# Patient Record
Sex: Male | Born: 1945 | Race: White | Hispanic: No | Marital: Married | Smoking: Never smoker
Health system: Southern US, Community
[De-identification: ages and names within clinical notes are randomized; demographics above are authoritative.]

## PROBLEM LIST (undated history)

## (undated) DIAGNOSIS — M1990 Unspecified osteoarthritis, unspecified site: Secondary | ICD-10-CM

## (undated) DIAGNOSIS — N281 Cyst of kidney, acquired: Secondary | ICD-10-CM

## (undated) DIAGNOSIS — H269 Unspecified cataract: Secondary | ICD-10-CM

## (undated) DIAGNOSIS — R12 Heartburn: Secondary | ICD-10-CM

## (undated) DIAGNOSIS — S42309A Unspecified fracture of shaft of humerus, unspecified arm, initial encounter for closed fracture: Secondary | ICD-10-CM

## (undated) DIAGNOSIS — G4730 Sleep apnea, unspecified: Secondary | ICD-10-CM

## (undated) DIAGNOSIS — I2510 Atherosclerotic heart disease of native coronary artery without angina pectoris: Secondary | ICD-10-CM

## (undated) DIAGNOSIS — Z9861 Coronary angioplasty status: Secondary | ICD-10-CM

## (undated) DIAGNOSIS — I517 Cardiomegaly: Secondary | ICD-10-CM

## (undated) DIAGNOSIS — I5189 Other ill-defined heart diseases: Secondary | ICD-10-CM

## (undated) DIAGNOSIS — E785 Hyperlipidemia, unspecified: Secondary | ICD-10-CM

## (undated) DIAGNOSIS — K645 Perianal venous thrombosis: Secondary | ICD-10-CM

## (undated) DIAGNOSIS — I519 Heart disease, unspecified: Secondary | ICD-10-CM

## (undated) DIAGNOSIS — I350 Nonrheumatic aortic (valve) stenosis: Secondary | ICD-10-CM

## (undated) DIAGNOSIS — M419 Scoliosis, unspecified: Secondary | ICD-10-CM

## (undated) DIAGNOSIS — L409 Psoriasis, unspecified: Secondary | ICD-10-CM

## (undated) DIAGNOSIS — I340 Nonrheumatic mitral (valve) insufficiency: Secondary | ICD-10-CM

## (undated) DIAGNOSIS — G8929 Other chronic pain: Secondary | ICD-10-CM

## (undated) DIAGNOSIS — M545 Low back pain, unspecified: Secondary | ICD-10-CM

## (undated) DIAGNOSIS — R569 Unspecified convulsions: Secondary | ICD-10-CM

## (undated) DIAGNOSIS — H9190 Unspecified hearing loss, unspecified ear: Secondary | ICD-10-CM

## (undated) DIAGNOSIS — I5032 Chronic diastolic (congestive) heart failure: Secondary | ICD-10-CM

## (undated) DIAGNOSIS — R011 Cardiac murmur, unspecified: Secondary | ICD-10-CM

## (undated) DIAGNOSIS — I700 Atherosclerosis of aorta: Secondary | ICD-10-CM

## (undated) DIAGNOSIS — I00 Rheumatic fever without heart involvement: Secondary | ICD-10-CM

## (undated) DIAGNOSIS — M8580 Other specified disorders of bone density and structure, unspecified site: Secondary | ICD-10-CM

## (undated) DIAGNOSIS — M51369 Other intervertebral disc degeneration, lumbar region without mention of lumbar back pain or lower extremity pain: Secondary | ICD-10-CM

## (undated) DIAGNOSIS — R001 Bradycardia, unspecified: Secondary | ICD-10-CM

## (undated) DIAGNOSIS — C44329 Squamous cell carcinoma of skin of other parts of face: Secondary | ICD-10-CM

## (undated) DIAGNOSIS — M5136 Other intervertebral disc degeneration, lumbar region: Secondary | ICD-10-CM

## (undated) HISTORY — PX: BACK SURGERY: SHX140

## (undated) HISTORY — PX: ORIF SCAPULAR FRACTURE: SUR958

## (undated) HISTORY — DX: Nonrheumatic aortic (valve) stenosis: I35.0

## (undated) HISTORY — PX: FOREARM FRACTURE SURGERY: SHX649

## (undated) HISTORY — PX: CATARACT EXTRACTION, BILATERAL: SHX1313

## (undated) HISTORY — PX: CORONARY ANGIOPLASTY: SHX604

## (undated) HISTORY — PX: KNEE ARTHROSCOPY: SHX127

## (undated) HISTORY — PX: APPENDECTOMY: SHX54

## (undated) HISTORY — PX: COLONOSCOPY: SHX174

## (undated) HISTORY — DX: Rheumatic fever without heart involvement: I00

## (undated) HISTORY — PX: OTHER SURGICAL HISTORY: SHX169

## (undated) HISTORY — PX: TONSILLECTOMY: SUR1361

## (undated) HISTORY — DX: Perianal venous thrombosis: K64.5

## (undated) HISTORY — PX: MOHS SURGERY: SUR867

---

## 1999-06-03 ENCOUNTER — Emergency Department (HOSPITAL_COMMUNITY): Admission: EM | Admit: 1999-06-03 | Discharge: 1999-06-03 | Payer: Self-pay

## 1999-06-06 ENCOUNTER — Emergency Department (HOSPITAL_COMMUNITY): Admission: EM | Admit: 1999-06-06 | Discharge: 1999-06-06 | Payer: Self-pay | Admitting: Emergency Medicine

## 1999-06-07 ENCOUNTER — Emergency Department (HOSPITAL_COMMUNITY): Admission: EM | Admit: 1999-06-07 | Discharge: 1999-06-07 | Payer: Self-pay | Admitting: Emergency Medicine

## 1999-06-10 ENCOUNTER — Emergency Department (HOSPITAL_COMMUNITY): Admission: EM | Admit: 1999-06-10 | Discharge: 1999-06-10 | Payer: Self-pay | Admitting: Emergency Medicine

## 1999-06-17 ENCOUNTER — Emergency Department (HOSPITAL_COMMUNITY): Admission: EM | Admit: 1999-06-17 | Discharge: 1999-06-17 | Payer: Self-pay | Admitting: Internal Medicine

## 1999-06-24 ENCOUNTER — Emergency Department (HOSPITAL_COMMUNITY): Admission: EM | Admit: 1999-06-24 | Discharge: 1999-06-24 | Payer: Self-pay | Admitting: Emergency Medicine

## 2001-08-18 ENCOUNTER — Ambulatory Visit (HOSPITAL_COMMUNITY): Admission: RE | Admit: 2001-08-18 | Discharge: 2001-08-18 | Payer: Self-pay | Admitting: *Deleted

## 2001-08-18 ENCOUNTER — Encounter (INDEPENDENT_AMBULATORY_CARE_PROVIDER_SITE_OTHER): Payer: Self-pay | Admitting: Specialist

## 2002-08-16 HISTORY — PX: POSTERIOR LUMBAR FUSION: SHX6036

## 2003-07-08 ENCOUNTER — Inpatient Hospital Stay (HOSPITAL_COMMUNITY): Admission: RE | Admit: 2003-07-08 | Discharge: 2003-07-13 | Payer: Self-pay | Admitting: Neurosurgery

## 2003-08-07 ENCOUNTER — Encounter: Admission: RE | Admit: 2003-08-07 | Discharge: 2003-08-07 | Payer: Self-pay | Admitting: Neurosurgery

## 2003-10-09 ENCOUNTER — Encounter: Admission: RE | Admit: 2003-10-09 | Discharge: 2003-10-09 | Payer: Self-pay | Admitting: Neurosurgery

## 2004-07-21 ENCOUNTER — Encounter: Admission: RE | Admit: 2004-07-21 | Discharge: 2004-07-21 | Payer: Self-pay | Admitting: Neurosurgery

## 2004-07-27 ENCOUNTER — Encounter: Admission: RE | Admit: 2004-07-27 | Discharge: 2004-07-27 | Payer: Self-pay | Admitting: Neurosurgery

## 2006-05-26 ENCOUNTER — Ambulatory Visit (HOSPITAL_BASED_OUTPATIENT_CLINIC_OR_DEPARTMENT_OTHER): Admission: RE | Admit: 2006-05-26 | Discharge: 2006-05-26 | Payer: Self-pay | Admitting: Orthopedic Surgery

## 2006-08-04 ENCOUNTER — Ambulatory Visit (HOSPITAL_BASED_OUTPATIENT_CLINIC_OR_DEPARTMENT_OTHER): Admission: RE | Admit: 2006-08-04 | Discharge: 2006-08-04 | Payer: Self-pay | Admitting: Surgery

## 2007-08-17 HISTORY — PX: HERNIA REPAIR: SHX51

## 2009-03-13 ENCOUNTER — Encounter: Admission: RE | Admit: 2009-03-13 | Discharge: 2009-03-13 | Payer: Self-pay | Admitting: Neurosurgery

## 2009-03-13 DIAGNOSIS — M419 Scoliosis, unspecified: Secondary | ICD-10-CM

## 2009-03-13 DIAGNOSIS — M8580 Other specified disorders of bone density and structure, unspecified site: Secondary | ICD-10-CM

## 2009-03-13 HISTORY — DX: Scoliosis, unspecified: M41.9

## 2009-03-13 HISTORY — DX: Other specified disorders of bone density and structure, unspecified site: M85.80

## 2009-03-24 ENCOUNTER — Encounter: Admission: RE | Admit: 2009-03-24 | Discharge: 2009-03-24 | Payer: Self-pay | Admitting: Neurosurgery

## 2010-06-01 ENCOUNTER — Emergency Department (HOSPITAL_COMMUNITY)
Admission: EM | Admit: 2010-06-01 | Discharge: 2010-06-02 | Payer: Self-pay | Source: Home / Self Care | Admitting: Emergency Medicine

## 2010-08-22 ENCOUNTER — Encounter
Admission: RE | Admit: 2010-08-22 | Discharge: 2010-08-22 | Payer: Self-pay | Source: Home / Self Care | Attending: Family Medicine | Admitting: Family Medicine

## 2010-10-28 LAB — CBC
HCT: 45.8 % (ref 39.0–52.0)
Hemoglobin: 15.7 g/dL (ref 13.0–17.0)
MCH: 30.4 pg (ref 26.0–34.0)
MCHC: 34.3 g/dL (ref 30.0–36.0)
MCV: 88.6 fL (ref 78.0–100.0)
Platelets: 194 10*3/uL (ref 150–400)
RBC: 5.17 MIL/uL (ref 4.22–5.81)
RDW: 14.1 % (ref 11.5–15.5)
WBC: 5.1 10*3/uL (ref 4.0–10.5)

## 2010-10-28 LAB — BASIC METABOLIC PANEL
BUN: 13 mg/dL (ref 6–23)
CO2: 31 mEq/L (ref 19–32)
Calcium: 9.4 mg/dL (ref 8.4–10.5)
Chloride: 103 mEq/L (ref 96–112)
Creatinine, Ser: 0.97 mg/dL (ref 0.4–1.5)
GFR calc Af Amer: >60 mL/min (ref >60)
GFR calc non Af Amer: >60 mL/min (ref >60)
Glucose, Bld: 93 mg/dL (ref 70–99)
Potassium: 3.9 mEq/L (ref 3.5–5.1)
Sodium: 138 mEq/L (ref 135–145)

## 2010-10-28 LAB — URINALYSIS, ROUTINE W REFLEX MICROSCOPIC
Bilirubin Urine: NEGATIVE
Glucose, UA: NEGATIVE mg/dL
Hgb urine dipstick: NEGATIVE
Ketones, ur: NEGATIVE mg/dL
Nitrite: NEGATIVE
Protein, ur: NEGATIVE mg/dL
Specific Gravity, Urine: 1.013 (ref 1.005–1.030)
Urobilinogen, UA: 0.2 mg/dL (ref 0.0–1.0)
pH: 5.5 (ref 5.0–8.0)

## 2010-10-28 LAB — DIFFERENTIAL
Basophils Absolute: 0 10*3/uL (ref 0.0–0.1)
Basophils Relative: 0 % (ref 0–1)
Eosinophils Absolute: 0.1 10*3/uL (ref 0.0–0.7)
Eosinophils Relative: 3 % (ref 0–5)
Lymphocytes Relative: 20 % (ref 12–46)
Lymphs Abs: 1 10*3/uL (ref 0.7–4.0)
Monocytes Absolute: 0.5 10*3/uL (ref 0.1–1.0)
Monocytes Relative: 9 % (ref 3–12)
Neutro Abs: 3.5 10*3/uL (ref 1.7–7.7)
Neutrophils Relative %: 68 % (ref 43–77)

## 2011-01-01 NOTE — Op Note (Signed)
NAME:  Douglas Lane, Douglas Lane                          ACCOUNT NO.:  [id number]   MEDICAL RECORD NO.:  [id number]                   PATIENT TYPE:  INP   LOCATION:  3172                                 FACILITY:  [facility]   PHYSICIAN:  Kathaleen Maser. Lane, M.D.                 DATE OF BIRTH:  [birth year]   DATE OF PROCEDURE:  07/08/2003  DATE OF DISCHARGE:                                 OPERATIVE REPORT   PREOPERATIVE DIAGNOSIS:  1. 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  2. 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   POSTOPERATIVE DIAGNOSES:  1. 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  2. 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   PROCEDURE:  1. L3-4 and L4-5 decompressive lumbar laminectomies with foraminotomies.  2. L5-S1 Gill procedure with foraminotomies.  3. L3-4, L4-5 and L5-S1 posterior lumbar interbody fusion utilizing tangent     wedges and focal autografting.  4. L3 through S1 posterolateral fusion utilizing segmental pedicle screw     fixation and local autografting.   SURGEON:  Kathaleen Maser. Lane, M.D.   ASSISTANT:  Donalee Citrin, M.D.   ANESTHESIA:  General endotracheal anesthesia.   INDICATIONS FOR PROCEDURE:  The patient is a 65 year old male with a history  of progressive lumbar pain and right lower extremity radiculopathy failing  all conservative management.  Workup has demonstrated evidence of a very  significant degenerative scoliosis involving L3-4 and L4-5 with a coexistent  grade 1 L5-S1 lytic spondylolisthesis.  The patient has been counseled as to  his options.  He has decided to proceed with three level decompression and  fusion with instrumentation to hopefully improve his symptoms.   PROCEDURE:  The patient was brought to the operating room, placed on the  table in a supine position and after adequate oral anesthesia was achieved  the patient was placed prone onto a Wilson  frame, appropriately padded  progressively in the lumbar region.  He was then prepped and draped  sterilely.  A 10 blade was used to make a linear skin incision extending  from  L3 down to the sacrum.  This was carried down sharply in the midline.  A subperiosteal dissection was performed exposing the lamina of the facet  joints of L3, L4, L5 and S1 as well as the transverse processes of L3, 4 and  5 and the sacral ala bilaterally.  Deep self retaining retractors were  placed.  Intraoperative fluoroscopy was used and the levels were confirmed.  Complete laminectomies were then performed at L3 and L4 and an L5 Gill  procedure was performed.  Complete inferior facetectomies were performed at  L3 and L4.  Superior facetectomies were performed at L4, L5 and S1.  All  bone was cleaned to be used in later autografting.  The ligamentum flavum  was then elevated and resected in a  piecemeal fashion.  The underlying  thecal sac next to the L3, L4, L5 and S1 nerve roots were all widely  decompressed throughout their foramina.  The epidural venous plexus was  coagulated and cut. Starting first at L3-4, the thecal sac and nerve roots  were protected.  The disk space was then incised starting first on the right  side and aggressive diskectomy was performed.  The procedure was then  repeated on the contralateral side.  The disk space was then distracted up  to 10 mm.  With the distractor left in the patient's left side, the thecal  sac and nerve roots were protected on the right.  The disk space was then  reamed and then cut with a 10 mm chisel.  The soft tissues were removed from  the interspace.  A 10 x 26 mm tangent wedge was then impacted into space to  recess approximately 2 mm to the posterior corner margin.  The procedure was  then repeated on the contralateral side again.  Prior to instillation of the  second tangent wedge, morselized autograft was packed into the interspace.  The second wedge was  confirmed to be in good position by fluoroscopy.  Attention was then placed to L4-5 where the procedure was then repeated  again with bilateral placement of 10 x 26 mm tangent wedges and interbody  autografting performed.  The procedure was then repeated at L5-S1 this time  using 8 x 26 mm tangent wedges bilaterally and interbody autografting.  The  patient's reduction at L5-S1 was reduced completely.  The pedicles at L3,  L4, L5 and S1 were then identified using surface landmarks and  intraoperative fluoroscopy.  The superficial bone involving the pedicles was  removed using a high speed drill.  Each pedicle was then probed using a  pedicle awl.  Each pedicle awl track was then probed and found to be solid  in bone.  Each pedicle awl track was then trapped with a 5.2 mm screw tap.  Each screw tap hole was probed and found to be solid in bone. 6.75 x 45 mm  screws were placed bilaterally at L3; 6.75 x 40 mm screws were placed  bilaterally at L4; 5.75 x 35 mm screws were placed bilaterally at L5; and  6.75 x 35 mm screws were placed bilaterally at S1.  All screws were well  positioned and solidly within bone.  The transverse processes and sacral ala  were then decorticated using a high speed drill.  Morselized autograft was  packed posterolaterally.  A segment of titanium rod was then contoured and  placed over the screw heads of L3 through S1.  This was then attached to the  segmental instrumentation using locking caps.  The locking caps were then  engaged in a sequential fashion with the construct under compression. A  transverse connector was placed.  Final images showed good position of the  bone grafts and hardware and proper level of the normalized spine.  The  wound was then irrigated with antibiotic solution. It was then closed in  typical fashion. A medium Hemovac drain was left in the epidural space.  Steri-Strips and a sterile dressing were applied.  There were no intraoperative  complications.  The patient tolerated the procedure well and  he returns to the recovery room postoperative.  Douglas Lane, M.D.    HAP/MEDQ  D:  07/08/2003  T:  07/08/2003  Job:  [id number]

## 2011-01-01 NOTE — Op Note (Signed)
Douglas Lane, Douglas Lane                ACCOUNT NO.:  [id number]   MEDICAL RECORD NO.:  [id number]          PATIENT TYPE:  AMB   LOCATION:  DSC                          FACILITY:  [facility]   PHYSICIAN:  Currie Paris, M.D.DATE OF BIRTH:  1945/08/22   DATE OF PROCEDURE:  08/04/2006  DATE OF DISCHARGE:                               OPERATIVE REPORT   OFFICE MEDICAL RECORD NUMBER:  [id number].   PREOPERATIVE DIAGNOSIS:  Left inguinal hernia.   POSTOPERATIVE DIAGNOSIS:  Left inguinal hernia - direct.   OPERATION:  Repair of left inguinal hernia with mesh (plug and patch).   SURGEON:  Dr. Jamey Ripa.   ANESTHESIA:  General.   CLINICAL HISTORY:  Mr. Tollison is a 65 year old active gentleman who  wished to have his inguinal hernia repaired.   DESCRIPTION OF PROCEDURE:  The patient was seen in the holding area and  had no further questions.  We both confirmed that the left side was the  operative side, and I initialed the left inguinal area as the operative  site.   The patient was taken to the operating room, and after satisfactory  general (LMA) anesthesia was obtained, the groin area was clipped,  prepped and draped.  The time-out occurred.   To help with postoperative analgesia, I used a 0.25% plain Marcaine.  I  infiltrated along the incision line and then below the fascia at the  anterior superior iliac spine.  Incision was made and deepened to the  external oblique aponeurosis with bleeders coagulated or tied.  The  external oblique was opened in the line of its fibers into the  superficial ring.  The cord was dissected up off inguinal floor.  Additional local was infiltrated into these tissues to again help with  postoperative pain relief.   The cord was stripped off, and there was no indirect sac.  There is a  very large direct defect involving basically the entire inguinal floor.  I opened the residual thin layer of tissue to expose the defect better.  I put a large mesh  plug in and anchored it with numerous sutures of 2-0  Prolene starting at pubic tubercle and then superiorly, well under the  internal oblique transversalis fascia and then inferiorly along the  inguinal ligament.  This kept the hernia nicely reduced.  The onlay  patch was then sutured in with a running suture going medially and then  interrupteds laterally to tack into the internal oblique.  It was split  laterally to go around the cord and tacked together and lay nicely to  reconstruct the deep ring.   I irrigated, and everything appeared dry.  The incision was closed with  3-0 Vicryl on the external oblique, 3-0 Vicryl on Scarpa's, and 4-0  Monocryl subcuticular plus Dermabond.   The patient tolerated the procedure well, and there were no operative  complications.  All counts were correct.      Currie Paris, M.D.  Electronically Signed     CJS/MEDQ  D:  08/04/2006  T:  08/04/2006  Job:  [id number]   cc:   Molly Maduro  K. Abigail Miyamoto, M.D.

## 2011-01-01 NOTE — Op Note (Signed)
Douglas Lane, Douglas Lane                ACCOUNT NO.:  [id number]   MEDICAL RECORD NO.:  [id number]          PATIENT TYPE:  AMB   LOCATION:  DSC                          FACILITY:  [facility]   PHYSICIAN:  Deidre Ala, M.D.    DATE OF BIRTH:  1946/01/03   DATE OF PROCEDURE:  05/26/2006  DATE OF DISCHARGE:                                 OPERATIVE REPORT   PREOPERATIVE DIAGNOSIS:  Loss of reduction, distal shaft, radial fracture,  right forearm, closed.   POSTOPERATIVE DIAGNOSIS:  Loss of reduction, distal shaft, radial fracture,  right forearm, closed.   PROCEDURES:  1. Open reduction, internal fixation of right distal shaft radius      fracture, with Synthes DCP plate and screws.  2. Use of intraoperative fluoroscopy.   SURGEON:  1. Charlesetta Shanks, M.D.   ASSISTANT:  Clarene Reamer, P.A.-C.   ANESTHESIA:  General with LMA.   CULTURES:  None.   DRAINS:  None.   ESTIMATED BLOOD LOSS:  Minimal.   TOURNIQUET TIME:  52 minutes.   PATHOLOGIC FINDINGS AND HISTORY:  Douglas Lane is a 65 year old male, who got his  right arm caught under a railroad tie doing some yard work.  He went to the  emergency room in [place], where a minimally displaced distal radius shaft  fracture was made, and it was actually distal 1/3-middle 1/3 junction.  He  had a splint placed, and the patient was seen by [place] 05/09/06.  It was felt  that his fracture was stable at this time, minimally displaced. The splint  was somewhat loose.  On his next visit 05/16/06, no change had been noted in  his fracture on x-ray, so a new long-arm cast was placed with some  supination and molding.  The patient came back in for followup, the fracture  now being 82 days old with loss of reduction, and the cast was somewhat  loose.  He therefore was taken to the operating room today, where we took  down the early fracture callus and anatomically reduced with interdigitation  the basically 2-part fracture, achieving anatomic reduction,  placing a  slightly flexed plate at the unfilled central hole of the 7-hole plate,  leaving 6 cortices on either side with 6 screws, bicortical, 3 on either  side.  Anatomic reduction was obtained under C-arm fluoroscopy.   DESCRIPTION OF PROCEDURES:  With adequate anesthesia obtained using LMA  technique, 1 g Ancef given IV prophylaxis, the patient was placed in the  supine position.  The right upper extremity was prepped from the fingertips  to the elbow in the standard fashion.  After standard prepping and draping,  Esmarch exsanguination was used.  The tourniquet was let up to 250 mmHg.  I  then made a dorsal skin incision over the fracture, distal portion of  forearm.  The incision was deepened sharply with a knife and hemostasis  obtained using the Bovie electrocoagulator.  Dissection was carried down,  where a large vein was cut and tied off with 3-0 Vicryl doubly.  Further  dissection was carried down and the seam split between  the out-croppers of  the wrist and the extensor carpi radialis, exposing the fracture site.  The  fracture callus was removed.  Lahey clamps were then used to reduce the  fracture anatomically with the appropriate rotation and interdigitation of  the fracture fragment.  We then slid on a 7-hole plate, leaving the central  hole free, and used the dynamic compression mode of it with a slight flexion  of the plate to effect compression all the way across the fracture site.  We  then placed the first staple hole distally, and then proximally placed the  dynamic screw to compress the fracture.  Other screw holes were then filled  in and C-arm fluoroscopy confirmed positioning.  We then placed some local  bone graft that we had removed, taking down the current callus into the  fracture site.  Irrigation was carried out.  The tourniquet was let down.  Bleeding points were cauterized.  The wound was then closed in layers with 2-  0 and 3-0 Vicryl subcu and skin  staples.  A bulky sterile compressive  dressing was applied with a sugar-tong splint and hand dressing.  The  patient, then, having tolerated the procedure well, was awakened and taken  to the recovery room in satisfactory condition.  He will be discharged per  outpatient routine with forearm elevation with a hand dressing, Percocet for  pain, told to call the office for wound recheck on Monday.           ______________________________  V. Charlesetta Shanks, M.D.     VEP/MEDQ  D:  05/26/2006  T:  05/26/2006  Job:  [id number]   cc:   Chales Salmon. Abigail Miyamoto, M.D.

## 2011-01-01 NOTE — Discharge Summary (Signed)
NAME:  Douglas Lane, Douglas Lane                          ACCOUNT NO.:  [id number]   MEDICAL RECORD NO.:  [id number]                   PATIENT TYPE:  INP   LOCATION:  3014                                 FACILITY:  [facility]   PHYSICIAN:  Kathaleen Maser. Pool, M.D.                 DATE OF BIRTH:  [birth year]   DATE OF ADMISSION:  07/08/2003  DATE OF DISCHARGE:  07/13/2003                                 DISCHARGE SUMMARY   ATTENDING PHYSICIAN:  Julio Sicks, M.D.   SERVICE:  Neurosurgery   FINAL DIAGNOSIS:  L3-4, L4-5, and L5-S1 degenerative disk disease with  degenerative scoliosis and spondylolisthesis.   HISTORY OF PRESENT ILLNESS:  Mr. Glascoe is a 65 year old male with history  of progressive back and lower extremity pain consistent with lumbar  stenosis.  Workup has demonstrated evidence of severe degenerative scoliosis  with stenosis and an associated spondylolisthesis at L4-S1.  The patient  presents for three-level decompression and fusion with instrumentation.   HOSPITAL COURSE:  The patient was admitted and an uncomplicated L3-4, L4-5,  and L5-S1 decompression and posterior lumbar fusion with instrumentation was  performed.  Postoperatively the patient did well.  His back pain was  difficult to control initially but gradually improved.  He had no lower  extremity pain.  Motor and sensory examination were intact.  At the time of  discharge, the patient was ambulating without assistance.  He was tolerating  a regular diet.  His wound is healing well.   CONDITION AT DISCHARGE:  Improved   DISCHARGE DISPOSITION:  The patient will follow up in one week in my office.                                                [name], M.D.    HAP/MEDQ  D:  08/15/2003  T:  08/15/2003  Job:  [id number]

## 2011-01-27 ENCOUNTER — Emergency Department (HOSPITAL_COMMUNITY)
Admission: EM | Admit: 2011-01-27 | Discharge: 2011-01-27 | Disposition: A | Discharge status: Home / Self Care | Payer: BC Managed Care – PPO | Attending: Emergency Medicine | Admitting: Emergency Medicine

## 2011-01-27 ENCOUNTER — Emergency Department (HOSPITAL_COMMUNITY): Payer: BC Managed Care – PPO

## 2011-01-27 DIAGNOSIS — F29 Unspecified psychosis not due to a substance or known physiological condition: Secondary | ICD-10-CM | POA: Insufficient documentation

## 2011-01-27 DIAGNOSIS — R569 Unspecified convulsions: Secondary | ICD-10-CM | POA: Insufficient documentation

## 2011-01-27 DIAGNOSIS — R4182 Altered mental status, unspecified: Secondary | ICD-10-CM | POA: Insufficient documentation

## 2011-01-27 DIAGNOSIS — G40309 Generalized idiopathic epilepsy and epileptic syndromes, not intractable, without status epilepticus: Secondary | ICD-10-CM

## 2011-01-27 LAB — URINALYSIS, ROUTINE W REFLEX MICROSCOPIC
Bilirubin Urine: NEGATIVE
Glucose, UA: NEGATIVE mg/dL
Hgb urine dipstick: NEGATIVE
Ketones, ur: NEGATIVE mg/dL
Leukocytes, UA: NEGATIVE
Nitrite: NEGATIVE
Protein, ur: NEGATIVE mg/dL
Specific Gravity, Urine: 1.014 (ref 1.005–1.030)
Urobilinogen, UA: 0.2 mg/dL (ref 0.0–1.0)
pH: 6 (ref 5.0–8.0)

## 2011-01-27 LAB — DIFFERENTIAL
Basophils Absolute: 0 10*3/uL (ref 0.0–0.1)
Basophils Relative: 1 % (ref 0–1)
Eosinophils Absolute: 0.1 10*3/uL (ref 0.0–0.7)
Eosinophils Relative: 2 % (ref 0–5)
Lymphocytes Relative: 21 % (ref 12–46)
Lymphs Abs: 1.2 10*3/uL (ref 0.7–4.0)
Monocytes Absolute: 0.5 10*3/uL (ref 0.1–1.0)
Monocytes Relative: 8 % (ref 3–12)
Neutro Abs: 4 10*3/uL (ref 1.7–7.7)
Neutrophils Relative %: 68 % (ref 43–77)

## 2011-01-27 LAB — POCT I-STAT 3, ART BLOOD GAS (G3+)
Acid-base deficit: 3 mmol/L — ABNORMAL HIGH (ref 0.0–2.0)
Bicarbonate: 22.4 mEq/L (ref 20.0–24.0)
O2 Saturation: 95 %
Patient temperature: 98
TCO2: 24 mmol/L (ref 0–100)
pCO2 arterial: 39 mmHg (ref 35.0–45.0)
pH, Arterial: 7.366 (ref 7.350–7.450)
pO2, Arterial: 77 mmHg — ABNORMAL LOW (ref 80.0–100.0)

## 2011-01-27 LAB — POCT I-STAT, CHEM 8
BUN: 15 mg/dL (ref 6–23)
Calcium, Ion: 1.16 mmol/L (ref 1.12–1.32)
Chloride: 104 mEq/L (ref 96–112)
Creatinine, Ser: 0.9 mg/dL (ref 0.4–1.5)
Glucose, Bld: 142 mg/dL — ABNORMAL HIGH (ref 70–99)
HCT: 47 % (ref 39.0–52.0)
Hemoglobin: 16 g/dL (ref 13.0–17.0)
Potassium: 4.5 mEq/L (ref 3.5–5.1)
Sodium: 136 mEq/L (ref 135–145)
TCO2: 21 mmol/L (ref 0–100)

## 2011-01-27 LAB — CBC
HCT: 43.9 % (ref 39.0–52.0)
Hemoglobin: 15.1 g/dL (ref 13.0–17.0)
MCH: 30.3 pg (ref 26.0–34.0)
MCHC: 34.4 g/dL (ref 30.0–36.0)
MCV: 88 fL (ref 78.0–100.0)
Platelets: 193 10*3/uL (ref 150–400)
RBC: 4.99 MIL/uL (ref 4.22–5.81)
RDW: 14.1 % (ref 11.5–15.5)
WBC: 5.8 10*3/uL (ref 4.0–10.5)

## 2011-01-27 LAB — PROTIME-INR
INR: 0.96 (ref 0.00–1.49)
Prothrombin Time: 13 seconds (ref 11.6–15.2)

## 2011-01-27 LAB — LACTIC ACID, PLASMA: Lactic Acid, Venous: 5.4 mmol/L — ABNORMAL HIGH (ref 0.5–2.2)

## 2011-01-27 LAB — CK TOTAL AND CKMB (NOT AT ARMC)
CK, MB: 4 ng/mL (ref 0.3–4.0)
Relative Index: 2.1 (ref 0.0–2.5)
Total CK: 190 U/L (ref 7–232)

## 2011-01-27 LAB — TROPONIN I: Troponin I: <0.3 ng/mL (ref <0.30)

## 2011-01-28 LAB — URINE CULTURE
Colony Count: NO GROWTH
Culture  Setup Time: 201206130945
Culture: NO GROWTH

## 2011-01-29 NOTE — Consult Note (Signed)
NAMEJANCARLOS, Lane                ACCOUNT NO.:  [id number]  MEDICAL RECORD NO.:  [id number]  LOCATION:  [facility]                         FACILITY:  [facility]  PHYSICIAN:  Levie Heritage, MD       DATE OF BIRTH:  May 16, 1946  DATE OF CONSULTATION:  01/27/2011 DATE OF DISCHARGE:  01/27/2011                                CONSULTATION   REASON FOR CONSULTATION:  New-onset seizure.  HISTORY OF PRESENT ILLNESS:  This is a 65 year old Caucasian male with past medical history of hypercholesterolemia, arthritis, insomnia, and chronic low back pain.  Over the past 6 months per wife, the patient has been under some significant stress, which includes his son going through a divorce, two of pets recently denying, and today January 27, 2011, being the first day of his retirement to which he is handling poorly.  The patient was at his baseline last night, went to sleep at approximately 4 a.m.  The patient's wife was awakened by the patient's jerking bilaterally next to her.  She states that his bilateral arms were held on extension.  He was foaming in his mouth and there was blood tinged foam coming from his mouth.  He was not arousable after the 30-second event.  For this reason, EMS was called and the patient was brought to the [facility] ED.  Once at [facility] ED, apparently the patient was having some difficulty respirations in critical care along with neurological consult.  Other time, the patient was seen by both critical care and Neurology.  The patient was awake, alert, oriented, and he was breathing sufficiently by himself.  The patient states he has had no history of seizure or seizure activity in the past.  PAST MEDICAL HISTORY:  As stated above.  MEDICATIONS:  The patient is on clonazepam 1 mg at bedtime for sleep and naproxen for back pain.  ALLERGIES:  SEASONAL.  FAMILY HISTORY:  Noncontributable.  SOCIAL HISTORY:  The patient does not smoke.  He does drink approximately two beers every day.  He  does not do any illicit drugs. He is married, has three grown children.  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  REVIEW OF SYSTEMS:  Negative with the exception above.  PHYSICAL EXAM:  VITAL SIGNS:  Blood pressure is 130/80, pulse 75, respirations 16, temperature 98.4. GENERAL:  The patient is alert and oriented x3, carries out 2 and 3-step commands. NEUROLOGIC:  Pupils are equal, round, and reactive to light and accommodating 3 mm and 2 mm, conjugate gaze.  Extraocular movements intact.  The patient shows no ptosis.  Visual fields grossly intact. Face symmetrical.  Tongue is midline.  Uvula is midline.  The patient shows no dysarthria, aphasia, or slurred speech.  On exam, the patient does have teeth marks on the bilateral vermilion border of his tongue, which are very sensitive at this time.  Facial sensation is grossly intact to pinprick and light touch throughout.  Shoulder shrug and head turn is within normal limits.  Coordination:  Finger-to-nose and heel-to- shin are smooth.  Motor shows 5/5 strength throughout.  Deep tendon reflexes are 2+ throughout and  downgoing toes bilaterally.  The patient shows no drift in the upper or lower extremities.  Sensation is grossly intact to pinprick, light touch, and vibration. PULMONARY:  Clear to auscultation. CARDIOVASCULAR:  S1-S2 were audible.  No bruits are noted.  LABS:  Urinalysis was negative.  Hemoglobin 16, hematocrit 47.  Sodium 136, potassium 4.5, chloride 104, glucose 142, BUN 15, creatinine 0.9, INR is 0.96.  PT is 13.  CT of the brain showed no acute abnormalities.  No mass, bleed, no ventriculomegaly, and again no acute acute stroke.  ASSESSMENT:  This is a pleasant 65 year old male, who presents to the [facility] ED after having an episode of jerking, it lasted for 30 seconds, and was followed by confusion.  Per wife, this is a first-time event. He has no history  of seizures.  He is now back to his baseline mentation.  This represent a first-time seizure vs possibility of parasomnia, however doesn't warrant  long term treatment with anti-epileptics at this point.  RECOMMENDATIONS: 1. Patient should     follow-up with primary care MD and [facility]     as an outpatient.  Their number is [phone]. 2. Has been discussed with both wife and husband that he is not to     drive, climb any heights or standing any standing water for 6     months or until cleared by [facility] and     primary care MD. 3. The patient would likely benefit from an MRI and EEG as a     outpatient.  Dr. Hoy Morn was seen and evaluated the patient, he agrees with the above- mentioned.  No follow-up is anticipated.     Felicie Morn, PA-C  I have examined the patient and agree with above clinical findings. ______________________________ Levie Heritage, MD    DS/MEDQ  D:  01/27/2011  T:  01/28/2011  Job:  [id number]  Electronically Signed by Felicie Morn PA-C on 01/29/2011 08:10:32 AM Electronically Signed by Levie Heritage MD on 01/29/2011 03:30:26 PM

## 2011-08-09 ENCOUNTER — Emergency Department (HOSPITAL_COMMUNITY)
Admission: EM | Admit: 2011-08-09 | Discharge: 2011-08-09 | Disposition: A | Discharge status: Home / Self Care | Payer: Medicare Other | Attending: Emergency Medicine | Admitting: Emergency Medicine

## 2011-08-09 ENCOUNTER — Encounter: Payer: Self-pay | Admitting: *Deleted

## 2011-08-09 ENCOUNTER — Emergency Department (HOSPITAL_COMMUNITY): Payer: Medicare Other

## 2011-08-09 DIAGNOSIS — R569 Unspecified convulsions: Secondary | ICD-10-CM | POA: Insufficient documentation

## 2011-08-09 HISTORY — DX: Sleep apnea, unspecified: G47.30

## 2011-08-09 HISTORY — DX: Unspecified convulsions: R56.9

## 2011-08-09 HISTORY — DX: Psoriasis, unspecified: L40.9

## 2011-08-09 LAB — POCT I-STAT, CHEM 8
BUN: 19 mg/dL (ref 6–23)
Calcium, Ion: 1.19 mmol/L (ref 1.12–1.32)
Chloride: 101 mEq/L (ref 96–112)
Creatinine, Ser: 1.1 mg/dL (ref 0.50–1.35)
TCO2: 25 mmol/L (ref 0–100)

## 2011-08-09 LAB — URINALYSIS, ROUTINE W REFLEX MICROSCOPIC
Bilirubin Urine: NEGATIVE
Glucose, UA: NEGATIVE mg/dL
Ketones, ur: NEGATIVE mg/dL
Leukocytes, UA: NEGATIVE
Protein, ur: NEGATIVE mg/dL
pH: 5 (ref 5.0–8.0)

## 2011-08-09 LAB — URINE MICROSCOPIC-ADD ON

## 2011-08-09 LAB — GLUCOSE, CAPILLARY: Glucose-Capillary: 123 mg/dL — ABNORMAL HIGH (ref 70–99)

## 2011-08-09 NOTE — ED Notes (Signed)
ZOX:WRUE<AV> Expected date:08/09/11<BR> Expected time: 2:20 AM<BR> Means of arrival:Ambulance<BR> Comments:<BR> EMS- seizure,postical, hypoxic

## 2011-08-09 NOTE — ED Notes (Signed)
Per wife whom is at bedside, she states pt has history of seizure 6 mths ago and was cleared the 2nd of Dec,  Pt also hit is head on Thursday but denies LOC,  Pt does have a mark on top of head where he came up and hit on pipe in basement as he was standing up.  Wife states she probably should have brought pt in earlier today because he was repeating himself and didn't remember going on a trip this past week.   Pt is tearful and seems to be scared he wants to know how and why he is here.

## 2011-08-09 NOTE — ED Provider Notes (Signed)
History     CSN: [id number]  Arrival date & time 08/09/11  0251   First MD Initiated Contact with Patient 08/09/11 [phone]      Chief Complaint  Patient presents with  . Seizures    (Consider location/radiation/quality/duration/timing/severity/associated sxs/prior treatment) HPI Patient with history of prior seizure presents after seizure activity tonight at home. His wife states that his seizure began during sleep. She states that at the time EMS arrived he was mostly back to his normal mental status. Patient does not remember the episode but does remember arriving in the ED. He's had no recent illness, no fevers, no vomiting. His last seizure was 6 months ago and at that time he had a full neurologic workup including MRI and EEG which wife states did not show any acute findings. He has followed up with neurology and it was just discharged from the clinic to 26 months without seizure. In the ED patient has no complaints and is at his baseline mental status.  Past Medical History  Diagnosis Date  . Sleep apnea   . Seizure   . Psoriasis     History reviewed. No pertinent past surgical history.  No family history on file.  History  Substance Use Topics  . Smoking status: Not on file  . Smokeless tobacco: Not on file  . Alcohol Use:       Review of Systems ROS reviewed and otherwise negative except for mentioned in HPI  Allergies  Review of patient's allergies indicates no known allergies.  Home Medications  No current outpatient prescriptions on file.  BP 119/55  Pulse 74  Temp(Src) 98.1 F (36.7 C) (Oral)  Resp 13  SpO2 99% Vitals reviewed Physical Exam Physical Examination: General appearance - alert, well appearing, and in no distress Mental status - alert, oriented to person, place, and time Eyes - pupils equal and reactive, extraocular eye movements intact Chest - clear to auscultation, no wheezes, rales or rhonchi, symmetric air entry Heart - normal rate,  regular rhythm, normal S1, S2, no murmurs, rubs, clicks or gallops Abdomen - soft, nontender, nondistended, no masses or organomegaly Neurological - alert, oriented, normal speech, no focal findings or movement disorder noted, cranial nerves 2-12 tested and intact, strength 5/5 in extremities x 4, sensation intact Musculoskeletal - no joint tenderness, deformity or swelling Extremities - peripheral pulses normal, no pedal edema, no clubbing or cyanosis Skin - normal coloration and turgor, no rashes  ED Course  Procedures (including critical care time)  Labs Reviewed  GLUCOSE, CAPILLARY - Abnormal; Notable for the following:    Glucose-Capillary 123 (*)    All other components within normal limits  URINALYSIS, ROUTINE W REFLEX MICROSCOPIC - Abnormal; Notable for the following:    Hgb urine dipstick TRACE (*)    All other components within normal limits  POCT I-STAT, CHEM 8 - Abnormal; Notable for the following:    Glucose, Bld 112 (*)    All other components within normal limits  URINE MICROSCOPIC-ADD ON - Abnormal; Notable for the following:    Bacteria, UA MANY (*)    All other components within normal limits  URINE CULTURE  LAB REPORT - SCANNED   No results found.   1. Seizure       MDM  Patient presenting after seizure tonight at home. He is to return to his baseline mental status upon arrival to the ED period head CT was unremarkable as well as lab testing. His urinalysis shows many bacteria however no  leukocytes or nitrite. He has no burning with urination frequency or urgency. A urine culture was sent patient is nontoxic and well-hydrated appearing. He be discharged and will arrange for followup with his neurologist.        Ethelda Chick, MD 08/11/11 1256

## 2011-08-09 NOTE — ED Notes (Signed)
Report received from night RN. First contact with patient. Pt is alert, oriented. Plan of care discussed with patient. Pt denied any pain. Informed that we are waiting on urine result. nad noted. Will continue to monitor.

## 2011-08-09 NOTE — ED Notes (Addendum)
Per EMS - pt's wife reports awakening this a.m. To pt having a seizure - pt has hx x6 months ago of seizure. Pt w/ pulse ox of 81% on EMS - NRB applied and pt w/ pulse ox 96%. Pt also w/ hx of sleep apnea. Pt becoming more A&O en route.

## 2011-08-10 LAB — URINE CULTURE
Colony Count: NO GROWTH
Culture  Setup Time: 201212241223

## 2011-12-24 ENCOUNTER — Encounter (INDEPENDENT_AMBULATORY_CARE_PROVIDER_SITE_OTHER): Payer: Self-pay | Admitting: Surgery

## 2011-12-24 ENCOUNTER — Ambulatory Visit (INDEPENDENT_AMBULATORY_CARE_PROVIDER_SITE_OTHER): Payer: Medicare Other | Admitting: Surgery

## 2011-12-24 VITALS — BP 137/92 | HR 87 | Temp 98.9°F | Ht 67.0 in | Wt 191.6 lb

## 2011-12-24 DIAGNOSIS — K649 Unspecified hemorrhoids: Secondary | ICD-10-CM

## 2011-12-24 NOTE — Progress Notes (Signed)
[facility]  Ovidio Kin, MD,  FACS [address] [place]  [address] [place], [place]    [address] Phone:  [phone] FAX:  [phone]   Re:   YEHUDA PRINTUP DOB:   [birth year] MRN:   [id number]  URGENT OFFICE  ASSESSMENT AND PLAN: 1.  Hemorrhoids - thrombosed  4-5 cm thrombosed complex.  I&D at bedside.  To start sitz baths QID (because of the fx scapula, he cannot get in a bath tub), topical agents prn, stools softeners, return to the office in 2 weeks.  2.  Scapular fx  12/21/2011  To see ortho in [facility] next week. 3.  Psoriasis.  HISTORY OF PRESENT ILLNESS: Chief Complaint  Patient presents with  . Follow-up    throm hems    ROMA BIERLEIN is a 66 y.o. (DOB: 04/10/1946)  white male who is a patient of ROSS,ALLAN, MD, MD and comes to me today for thrombosed hemorrhoid.  The patient fell off a ladder on Tuesday, May 7. He went to [facility] where he was kept overnight to control his pain.  He was discharged home Wednesday, May 8, and noticed swelling in his rectum. He has a prior history of thrombosed hemorrhoids over 20 years ago. He's had no recent rectal or hemorrhoidal problems.  He saw Dr. Minda Ditto on Thursday, on May 9, who did an incision and drainage of thrombosed hemorrhoids. He comes today with worsening hemorrhoids.   PHYSICAL EXAM: BP 137/92  Pulse 87  Temp(Src) 98.9 F (37.2 C) (Temporal)  Ht 5\' 7"  (1.702 m)  Wt 191 lb 9.6 oz (86.909 kg)  BMI 30.01 kg/m2  SpO2 96%  Rectum:  4 - 5 cm left necrotic thrombosed hemorrhoid.  (Photo taken)  PROCEDURE:  While in the office, I painted the hemorrhoids with betadine.  I injected 8 cc of 1% xylocaine.  I did an I&D of the hemorrhoids.  DATA REVIEWED: Notes from Dr. Tenny Craw.  Ovidio Kin, MD, FACS Office:  [phone]   Flagged as incomplete note in Epic for unclear reason. DN 05/19/2012  Note remains incomplete, but I cannot figure out how to "complete" it. DN  04/19/2013

## 2012-01-06 ENCOUNTER — Ambulatory Visit (INDEPENDENT_AMBULATORY_CARE_PROVIDER_SITE_OTHER): Payer: Medicare Other | Admitting: Surgery

## 2012-01-06 ENCOUNTER — Encounter (INDEPENDENT_AMBULATORY_CARE_PROVIDER_SITE_OTHER): Payer: Self-pay | Admitting: Surgery

## 2012-01-06 VITALS — BP 138/88 | HR 68 | Temp 97.6°F | Resp 16 | Ht 67.0 in | Wt 186.4 lb

## 2012-01-06 DIAGNOSIS — K649 Unspecified hemorrhoids: Secondary | ICD-10-CM

## 2012-01-06 NOTE — Progress Notes (Signed)
[facility]  Ovidio Kin, MD,  FACS [address] [place]  [address] [place], [place]    [address] Phone:  [phone] FAX:  [phone]   Re:   Douglas Lane DOB:   26-Jan-1946 MRN:   [id number]  URGENT OFFICE  ASSESSMENT AND PLAN: 1.  Hemorrhoids - thrombosed.  Improved since last visit.  2 x 1 cm healing thrombosed hemorrhoid.  To continue sitz baths - BID.  I wrote another prescription of 2.5% HC cream for his rectum. I tried to reassure him that I thought that this is healing.  I told him if there is residual tissue/hemorrhoid in 3 to 6 months after the I&D, then I would consider surgery, but not until then.  He will see me back in 6 weeks. 2.  Scapular fx  12/21/2011  Saw Dr. Thamas Jaegers, ortho in [facility].  They are concerned he has more damage than expected.  He is following up with him. 3.  Psoriasis.  HISTORY OF PRESENT ILLNESS: Chief Complaint  Patient presents with  . Routine Post Op    reck thromb hems    Douglas Lane is a 66 y.o. (DOB: May 26, 1946)  white male who is a patient of ROSS,ALLAN, MD, MD and comes to me today for follow up of a thrombosed hemorrhoid.  The patient fell off a ladder on Tuesday, May 7. He went to [facility] where he was kept overnight to control his pain.  He was discharged home Wednesday, May 8, and noticed swelling in his rectum.  He saw Dr. Minda Ditto on Thursday, on May 9, who did an incision and drainage of thrombosed hemorrhoids.   He saw me in the Urgent Office on 12/24/2011.  His shoulder is better. Saw Dr. Thamas Jaegers, ortho in [facility].  His bottom is better, though he still has trouble sitting on it and it bleeds.  He is taking 6 to 10 ibuprofen a day.  I told him the ibuprofen would increase the risk of bleeding, but if it helps his shoulder, it is probably worth it.  He is doing okay with his bowels.  He is frustrated by how slow the shoulder and rectum are to heal.  PHYSICAL EXAM: BP 138/88  Pulse 68   Temp(Src) 97.6 F (36.4 C) (Temporal)  Resp 16  Ht 5\' 7"  (1.702 m)  Wt 186 lb 6.4 oz (84.55 kg)  BMI 29.19 kg/m2  Rectum:  2 x 1 cm hemorrhoid on the left rectal wall.  This is much better than at the last visit.  DATA REVIEWED: No new notes.  Ovidio Kin, MD, FACS Office:  [phone]

## 2012-02-16 ENCOUNTER — Ambulatory Visit (INDEPENDENT_AMBULATORY_CARE_PROVIDER_SITE_OTHER): Payer: Medicare Other | Admitting: Surgery

## 2012-04-21 ENCOUNTER — Ambulatory Visit (INDEPENDENT_AMBULATORY_CARE_PROVIDER_SITE_OTHER): Payer: Medicare Other | Admitting: Surgery

## 2012-04-27 ENCOUNTER — Encounter (INDEPENDENT_AMBULATORY_CARE_PROVIDER_SITE_OTHER): Payer: Self-pay | Admitting: Surgery

## 2012-04-27 ENCOUNTER — Ambulatory Visit (INDEPENDENT_AMBULATORY_CARE_PROVIDER_SITE_OTHER): Payer: Medicare Other | Admitting: Surgery

## 2012-04-27 VITALS — BP 128/82 | HR 76 | Temp 97.0°F | Resp 16 | Ht 67.0 in | Wt 180.0 lb

## 2012-04-27 DIAGNOSIS — K649 Unspecified hemorrhoids: Secondary | ICD-10-CM

## 2012-04-27 NOTE — Progress Notes (Addendum)
[facility]  Ovidio Kin, MD,  FACS [address] [place]  [address] [place], [place]    [address] Phone:  [phone] FAX:  [phone]   Re:   Douglas Lane DOB:   08-30-1945 MRN:   [id number]  URGENT OFFICE  ASSESSMENT AND PLAN: 1.  Hemorrhoids - thrombosed.  This started out almost 5 cm in size May 2013.  The hemorrhoids have resolved.  He has a minimal tag in his left rectum and minimal other disease.  Nothing else needs to be done.  Return to me is PRN.  [Renewed Proctozone-HC 2.5% cream with one refill.  DN 04/03/2014]  2.  Scapular fx  12/21/2011  Saw Dr. Thamas Jaegers, ortho in [facility].    He has recovered from the fractured scapula and has almost no symptoms. 3.  Psoriasis. 4.  Elevated PSA, which is improving.  The original PSA was drawn just 2 weeks after his fall and I think this had an effect on the elevated number that he saw.  Has see Dr. Annabell Howells.  HISTORY OF PRESENT ILLNESS: No chief complaint on file.   Douglas Lane is a 66 y.o. (DOB: 08-Feb-1946)  white male who is a patient of ROSS,ALLAN, MD and comes to me today for follow up of a thrombosed hemorrhoid.  He has done well. His rectum is essentially healed.  He has no further complaints.  We spent more time talking about his elevated PSA.  He had a PSA drawn 2 weeks after his fall and it was over 5.  He has had two levels checked since then and both have gotten better.  He does have follow up with Dr. Annabell Howells.  History of shoulder fracture: The patient fell off a ladder on Tuesday, Dec 21, 2011. He went to [facility] where he was kept overnight to control his pain.  He was discharged home Wednesday, May 8, and noticed swelling in his rectum.  He saw Dr. Minda Ditto on Thursday, on May 9, who did an incision and drainage of thrombosed hemorrhoids.   PHYSICAL EXAM: BP 128/82  Pulse 76  Temp 97 F (36.1 C) (Temporal)  Resp 16  Ht 5\' 7"  (1.702 m)  Wt 180 lb (81.647 kg)  BMI 28.19  kg/m2  Rectum:  Minimal hemorrhoidal tag on the left anal/rectal wall.  Minimal internal hemorrhoids.  No mass.  He did not tell me about his PSA until after I did my digital exam, so I cannot comment on his prostate.  DATA REVIEWED: No new notes.  Ovidio Kin, MD, FACS Office:  [phone]

## 2013-02-12 ENCOUNTER — Telehealth (INDEPENDENT_AMBULATORY_CARE_PROVIDER_SITE_OTHER): Payer: Self-pay | Admitting: General Surgery

## 2013-02-12 NOTE — Telephone Encounter (Signed)
Pt called in requesting a refill on Proctozone Cream. His LOV was 04/27/2012 with Newman,MD. I advised him that he would need to be seen for a refill. The first available appoint was in September. He refused and said he was going to see a another MD 7/24 and would ask him to refill. I also told him that [employer]) might have something else available. Would you please advise.

## 2013-02-14 ENCOUNTER — Telehealth (INDEPENDENT_AMBULATORY_CARE_PROVIDER_SITE_OTHER): Payer: Self-pay

## 2013-02-14 NOTE — Telephone Encounter (Signed)
V/M  Proctozone  HC 2.5% Cr 30 gm apply to rectum BID w/ 2 refills per Dr. Ezzard Standing

## 2014-07-16 DIAGNOSIS — S42309A Unspecified fracture of shaft of humerus, unspecified arm, initial encounter for closed fracture: Secondary | ICD-10-CM

## 2014-07-16 HISTORY — DX: Unspecified fracture of shaft of humerus, unspecified arm, initial encounter for closed fracture: S42.309A

## 2014-08-03 ENCOUNTER — Emergency Department (HOSPITAL_COMMUNITY): Payer: Medicare PPO

## 2014-08-03 ENCOUNTER — Emergency Department (HOSPITAL_COMMUNITY)
Admission: EM | Admit: 2014-08-03 | Discharge: 2014-08-03 | Disposition: A | Discharge status: Home / Self Care | Payer: Medicare PPO | Attending: Emergency Medicine | Admitting: Emergency Medicine

## 2014-08-03 ENCOUNTER — Encounter (HOSPITAL_COMMUNITY): Payer: Self-pay | Admitting: Emergency Medicine

## 2014-08-03 DIAGNOSIS — Z8719 Personal history of other diseases of the digestive system: Secondary | ICD-10-CM | POA: Diagnosis not present

## 2014-08-03 DIAGNOSIS — M25511 Pain in right shoulder: Secondary | ICD-10-CM | POA: Insufficient documentation

## 2014-08-03 DIAGNOSIS — Z79899 Other long term (current) drug therapy: Secondary | ICD-10-CM | POA: Insufficient documentation

## 2014-08-03 DIAGNOSIS — Y929 Unspecified place or not applicable: Secondary | ICD-10-CM | POA: Diagnosis not present

## 2014-08-03 DIAGNOSIS — S43022A Posterior subluxation of left humerus, initial encounter: Secondary | ICD-10-CM

## 2014-08-03 DIAGNOSIS — S42202A Unspecified fracture of upper end of left humerus, initial encounter for closed fracture: Secondary | ICD-10-CM | POA: Insufficient documentation

## 2014-08-03 DIAGNOSIS — R000 Tachycardia, unspecified: Secondary | ICD-10-CM | POA: Diagnosis not present

## 2014-08-03 DIAGNOSIS — R52 Pain, unspecified: Secondary | ICD-10-CM

## 2014-08-03 DIAGNOSIS — Z7982 Long term (current) use of aspirin: Secondary | ICD-10-CM | POA: Diagnosis not present

## 2014-08-03 DIAGNOSIS — Z8619 Personal history of other infectious and parasitic diseases: Secondary | ICD-10-CM | POA: Diagnosis not present

## 2014-08-03 DIAGNOSIS — W19XXXA Unspecified fall, initial encounter: Secondary | ICD-10-CM

## 2014-08-03 DIAGNOSIS — W01198A Fall on same level from slipping, tripping and stumbling with subsequent striking against other object, initial encounter: Secondary | ICD-10-CM | POA: Insufficient documentation

## 2014-08-03 DIAGNOSIS — Y998 Other external cause status: Secondary | ICD-10-CM | POA: Diagnosis not present

## 2014-08-03 DIAGNOSIS — S43004A Unspecified dislocation of right shoulder joint, initial encounter: Secondary | ICD-10-CM | POA: Diagnosis not present

## 2014-08-03 DIAGNOSIS — S4991XA Unspecified injury of right shoulder and upper arm, initial encounter: Secondary | ICD-10-CM | POA: Diagnosis present

## 2014-08-03 DIAGNOSIS — Z8669 Personal history of other diseases of the nervous system and sense organs: Secondary | ICD-10-CM | POA: Diagnosis not present

## 2014-08-03 DIAGNOSIS — G40909 Epilepsy, unspecified, not intractable, without status epilepticus: Secondary | ICD-10-CM | POA: Insufficient documentation

## 2014-08-03 DIAGNOSIS — S43152A Posterior dislocation of left acromioclavicular joint, initial encounter: Secondary | ICD-10-CM | POA: Insufficient documentation

## 2014-08-03 DIAGNOSIS — Y9389 Activity, other specified: Secondary | ICD-10-CM | POA: Insufficient documentation

## 2014-08-03 DIAGNOSIS — S42302A Unspecified fracture of shaft of humerus, left arm, initial encounter for closed fracture: Secondary | ICD-10-CM

## 2014-08-03 DIAGNOSIS — M25519 Pain in unspecified shoulder: Secondary | ICD-10-CM

## 2014-08-03 DIAGNOSIS — S43006A Unspecified dislocation of unspecified shoulder joint, initial encounter: Secondary | ICD-10-CM

## 2014-08-03 MED ORDER — BUPIVACAINE HCL (PF) 0.5 % IJ SOLN
30.0000 mL | Freq: Once | INTRAMUSCULAR | Status: DC
  Filled 2014-08-03: qty 30

## 2014-08-03 MED ORDER — KETOROLAC TROMETHAMINE 60 MG/2ML IM SOLN
60.0000 mg | Freq: Once | INTRAMUSCULAR | Status: AC
  Administered 2014-08-03: 60 mg via INTRAMUSCULAR
  Filled 2014-08-03: qty 2

## 2014-08-03 MED ORDER — MORPHINE SULFATE 4 MG/ML IJ SOLN
6.0000 mg | Freq: Once | INTRAMUSCULAR | Status: AC
  Administered 2014-08-03: 6 mg via INTRAVENOUS
  Filled 2014-08-03: qty 2

## 2014-08-03 MED ORDER — LEVETIRACETAM 500 MG PO TABS: 500.0000 mg | ORAL_TABLET | Freq: Two times a day (BID) | ORAL | Status: DC

## 2014-08-03 MED ORDER — ONDANSETRON 4 MG PO TBDP: 4.0000 mg | ORAL_TABLET | Freq: Three times a day (TID) | ORAL | Status: DC | PRN

## 2014-08-03 MED ORDER — ONDANSETRON HCL 4 MG/2ML IJ SOLN
4.0000 mg | Freq: Once | INTRAMUSCULAR | Status: AC
  Administered 2014-08-03: 4 mg via INTRAVENOUS
  Filled 2014-08-03: qty 2

## 2014-08-03 MED ORDER — BUPIVACAINE HCL 0.5 % IJ SOLN
50.0000 mL | Freq: Once | INTRAMUSCULAR | Status: DC
Start: 2014-08-03 — End: 2014-08-03
  Filled 2014-08-03: qty 50

## 2014-08-03 MED ORDER — HYDROCODONE-ACETAMINOPHEN 5-325 MG PO TABS: 2.0000 | ORAL_TABLET | Freq: Two times a day (BID) | ORAL | Status: DC | PRN

## 2014-08-03 MED ORDER — LEVETIRACETAM IN NACL 1000 MG/100ML IV SOLN
1000.0000 mg | Freq: Once | INTRAVENOUS | Status: AC
Start: 2014-08-03 — End: 2014-08-03
  Administered 2014-08-03: 1000 mg via INTRAVENOUS
  Filled 2014-08-03 (×2): qty 100

## 2014-08-03 MED ORDER — KETAMINE HCL 10 MG/ML IJ SOLN
2.0000 mg/kg | Freq: Once | INTRAMUSCULAR | Status: AC
  Administered 2014-08-03: 163 mg via INTRAVENOUS
  Filled 2014-08-03: qty 16.3

## 2014-08-03 NOTE — ED Provider Notes (Signed)
CSN: [id number]     Arrival date & time 08/03/14  0146 History  This chart was scribed for Everlene Balls, MD by Marlowe Kays, ED Scribe. This patient was seen in room D31C/D31C and the patient's care was started at 2:03 AM.  Chief Complaint  Patient presents with  . Seizures  . Shoulder Injury   The history is provided by the EMS personnel. No language interpreter was used.    LEVEL 5 CAVEAT- Full history could not be obtained due to postictal.  HPI Comments:  Douglas Lane is a 68 y.o. male, brought in by EMS, who presents to the Emergency Department complaining of seizures and a right shoulder injury that occurred PTA. EMS states they were called out for a right shoulder injury but then witnessed another apparent seizure. They report that his seizure medication was decreased about two weeks ago. Fentanyl 200 mcg and Narcan 0.5 was administered by EMS.  Past Medical History  Diagnosis Date  . Sleep apnea   . Seizure   . Psoriasis   . Thrombosed hemorrhoids    Past Surgical History  Procedure Laterality Date  . Lumbar fusion  2004  . I&d hemorrhoirds     No family history on file. History  Substance Use Topics  . Smoking status: Never Smoker   . Smokeless tobacco: Not on file  . Alcohol Use: Yes    LEVEL 5 CAVEAT- Full history could not be obtained due to postictal.  Review of Systems  Unable to perform ROS   Allergies  Review of patient's allergies indicates no known allergies.  Home Medications   Prior to Admission medications   Medication Sig Start Date End Date Taking? Authorizing Provider  aspirin 81 MG tablet Take 81 mg by mouth daily.   Yes Historical Provider, MD  Hydrocortisone Acetate (PROCTOZONE H RE) Place rectally as needed.    Historical Provider, MD  levETIRAcetam (KEPPRA) 500 MG tablet Take 500 mg by mouth every 12 (twelve) hours.    Historical Provider, MD  lidocaine (XYLOCAINE) 2 % jelly Ad lib. 01/03/12   Historical Provider, MD   Triage  Vitals: BP 123/79 mmHg  Pulse 118  Temp(Src) 98.1 F (36.7 C) (Temporal)  Resp 22  SpO2 92% Physical Exam  Constitutional: He is oriented to person, place, and time. Vital signs are normal. He appears well-developed and well-nourished.  Non-toxic appearance. He does not appear ill. No distress.  Screaming in pain.  HENT:  Head: Normocephalic and atraumatic.  Nose: Nose normal.  Mouth/Throat: Oropharynx is clear and moist. No oropharyngeal exudate.  Small laceration with dried blood around mouth.  Eyes: Conjunctivae and EOM are normal. Pupils are equal, round, and reactive to light. No scleral icterus.  Neck: Normal range of motion. Neck supple. No tracheal deviation, no edema, no erythema and normal range of motion present. No thyroid mass and no thyromegaly present.  Cardiovascular: Regular rhythm, S1 normal, S2 normal, normal heart sounds, intact distal pulses and normal pulses.  Tachycardia present.  Exam reveals no gallop and no friction rub.   No murmur heard. Pulses:      Radial pulses are 2+ on the right side, and 2+ on the left side.       Dorsalis pedis pulses are 2+ on the right side, and 2+ on the left side.  Pulmonary/Chest: Effort normal and breath sounds normal. No respiratory distress. He has no wheezes. He has no rhonchi. He has no rales.  Abdominal: Soft. Normal appearance and bowel  sounds are normal. He exhibits no distension, no ascites and no mass. There is no hepatosplenomegaly. There is no tenderness. There is no rebound, no guarding and no CVA tenderness.  Musculoskeletal: Normal range of motion. He exhibits no edema or tenderness.  Step off of left humerus. Obvious deformity to right shoulder.  Lymphadenopathy:    He has no cervical adenopathy.  Neurological: He is alert and oriented to person, place, and time. He has normal strength. No cranial nerve deficit or sensory deficit. GCS eye subscore is 4. GCS verbal subscore is 5. GCS motor subscore is 6.  Skin: Skin  is warm, dry and intact. No petechiae and no rash noted. He is not diaphoretic. No erythema. No pallor.  Psychiatric: He has a normal mood and affect. His behavior is normal. Judgment normal.  Nursing note and vitals reviewed.   ED Course  Procedures (including critical care time) DIAGNOSTIC STUDIES: Oxygen Saturation is 92% on RA, low by my interpretation.   COORDINATION OF CARE: 2:07 AM- Will X-Ray right shoulder and right humerus. Will order pain medication. Pt verbalizes understanding and agrees to plan.  Medications  ketorolac (TORADOL) injection 60 mg (60 mg Intramuscular Given 08/03/14 0207)  levETIRAcetam (KEPPRA) IVPB 1000 mg/100 mL premix (1,000 mg Intravenous Given 08/03/14 0244)    Labs Review Labs Reviewed - No data to display  Imaging Review Dg Shoulder 1v Right  08/03/2014   CLINICAL DATA:  Injury to right shoulder.  Initial encounter.  EXAM: RIGHT SHOULDER - 1 VIEW  COMPARISON:  MRI of the right shoulder performed 08/31/2012  FINDINGS: There is a comminuted fracture involving the medial aspect of the right humeral head, with a displaced 3.1 cm fragment noted inferior to the glenoid fossa. On humeral radiographs, there is also a fracture fragment involving the greater tuberosity. Dislocation of the humeral head cannot be excluded, but is difficult to assess on provided images. There is somewhat unusual positioning of the scapula.  The visualized portions of the right lung are clear.  IMPRESSION: Comminuted fracture involving the right humeral head, with a displaced 3.1 cm medial humeral head fragment inferior to the glenoid fossa. Greater tuberosity fracture fragment also seen. Dislocation of the humeral head cannot be excluded, not well assessed on provided images. Somewhat unusual positioning of the scapula.   Electronically Signed   By: Garald Balding M.D.   On: 08/03/2014 03:11   Ct Head Wo Contrast  08/03/2014   CLINICAL DATA:  Seizure this morning  EXAM: CT HEAD  WITHOUT CONTRAST  TECHNIQUE: Contiguous axial images were obtained from the base of the skull through the vertex without intravenous contrast.  COMPARISON:  August 09, 2011  FINDINGS: The ventricles are normal in size and configuration. There is no apparent mass, hemorrhage, extra-axial fluid collection, or midline shift. Gray-white compartments appear within normal limits. No acute infarct apparent. The bony calvarium appears intact. The mastoid air cells are clear.  IMPRESSION: No intracranial mass, hemorrhage, or focal gray -white compartment lesions/acute appearing infarct. No lesion identified.   Electronically Signed   By: Lowella Grip M.D.   On: 08/03/2014 09:37   Ct Shoulder Right Wo Contrast  08/03/2014   CLINICAL DATA:  Known right humeral head fracture; concern for dislocation. Initial encounter.  EXAM: CT OF THE RIGHT SHOULDER WITHOUT CONTRAST  TECHNIQUE: Multidetector CT imaging was performed according to the standard protocol. Multiplanar CT image reconstructions were also generated.  COMPARISON:  Right shoulder radiographs performed earlier today at 2:28 a.m.  FINDINGS:  There is a comminuted fracture of the right humeral head, with two primary fracture fragments arising from the anterior aspect of the humeral head. One of these includes much of the greater tuberosity, while the other arises from the medial aspect of the anterior humeral head. The remainder of the humeral head is dislocated posteriorly, lodged against the posterior aspect of the glenoid. No definite glenoid fracture is seen at this time.  A few tiny osseous fragments are noted about the fracture site. A small glenohumeral joint effusion is seen, and there is mild surrounding soft tissue injury. The rotator cuff is difficult to fully assess on CT images. The right acromioclavicular joint is grossly unremarkable, aside from a tiny associated osseous fragment, likely degenerative in nature.  Soft tissue injury is noted  extending along the right anterior proximal arm. Mild right-sided atelectasis is noted. There is chronic deformity involving the distal tip of the scapula and right-sided ribs.  IMPRESSION: 1. Posterior dislocation of the humeral head, lodged against the posterior aspect of the glenoid. 2. Comminuted fracture of the right humeral head, with two primary fracture fragments arising from the anterior aspect of the humeral head. One of these includes much of the greater tuberosity, while the other arises from the medial aspect of the anterior humeral head. No definite glenoid fracture seen. 3. Small glenohumeral joint effusion noted. Mild surrounding soft tissue injury, with soft tissue injury extending along the right anterior proximal arm. 4. Mild right-sided atelectasis noted.  These results were called by telephone at the time of interpretation on 08/03/2014 at 5:27 am to Dr. Everlene Balls, who verbally acknowledged these results.   Electronically Signed   By: Garald Balding M.D.   On: 08/03/2014 05:31   Dg Shoulder Right Port  08/03/2014   CLINICAL DATA:  Post reduction for posterior dislocation  EXAM: PORTABLE RIGHT SHOULDER - 2+ VIEW  COMPARISON:  Right shoulder radiograph and right shoulder CT August 03, 2014  FINDINGS: Frontal and Y scapular images were obtained. The posterior dislocation has been reduced successfully. Comminuted fractures of the proximal humeral metaphysis are again noted. The joint spaces appear intact.  IMPRESSION: Complex proximal humeral fractures appear unchanged with several displaced fragments. The previously noted dislocation has been reduced successfully.   Electronically Signed   By: Lowella Grip M.D.   On: 08/03/2014 07:32   Dg Humerus Right  08/03/2014   CLINICAL DATA:  Acute right shoulder injury.  Initial encounter.  EXAM: RIGHT HUMERUS - 2+ VIEW  COMPARISON:  Right shoulder MRI performed 08/31/2012  FINDINGS: There is a comminuted fracture involving the right humeral  head, with displaced greater tuberosity and medial humeral head fragments. The humeral head position is difficult to fully assess on provided images, and dislocation cannot be excluded. Would correlate clinically for signs of dislocation.  The elbow joint is grossly unremarkable in appearance. No significant soft tissue abnormalities are characterized on radiograph. The right acromioclavicular joint is unremarkable.  IMPRESSION: Comminuted fracture involving the right humeral head, with displaced greater tuberosity and medial humeral head fragments. Humeral head position not well assessed on provided images; dislocation cannot be excluded. Would correlate clinically for signs of dislocation.   Electronically Signed   By: Garald Balding M.D.   On: 08/03/2014 03:15     EKG Interpretation None      MDM   Final diagnoses:  Pain    Patient presents to the ED after witnessed seizure and having his Keppra recently stopped.  CT scan shows  posterior dislocation in addition to humeral fracture.  Ortho consulted for reduction which was completed without complication.  Plan for the patient to fu with ortho within one week for continued care, norco for pain, he was given keppra load and will continue medication at home.  Neurology fu also advised within the next 3 days for care.    Patient was observed to full recovery after conscious sedation, his vital signs remain within his normal limits and he is safe for DC.  Procedural sedation Performed by: Everlene Balls Consent: Verbal consent obtained. Risks and benefits: risks, benefits and alternatives were discussed Required items: required blood products, implants, devices, and special equipment available Patient identity confirmed: arm band and provided demographic data Time out: Immediately prior to procedure a "time out" was called to verify the correct patient, procedure, equipment, support staff and site/side marked as required.  Sedation type: moderate  (conscious) sedation NPO time confirmed and considedered  Sedatives: Bartholomew   Physician Time at Bedside: 34min.  Vitals: Vital signs were monitored during sedation. Cardiac Monitor, pulse oximeter Patient tolerance: Patient tolerated the procedure well with no immediate complications. Comments: Pt with uneventful recovered. Returned to pre-procedural sedation baseline   I personally performed the services described in this documentation, which was scribed in my presence. The recorded information has been reviewed and is accurate.    Everlene Balls, MD 08/03/14 [phone]

## 2014-08-03 NOTE — Op Note (Signed)
Douglas Lane, Douglas Lane                ACCOUNT NO.:  [id number]  MEDICAL RECORD NO.:  [id number]  LOCATION:  D31C                         FACILITY:  [facility]  PHYSICIAN:  Lind Guest. Ninfa Linden, M.D.DATE OF BIRTH:  January 23, 1946  DATE OF PROCEDURE:  08/03/2014 DATE OF DISCHARGE:  08/03/2014                              OPERATIVE REPORT   PREPROCEDURE DIAGNOSIS:  Right posterior shoulder fracture, dislocation.  POSTPROCEDURE DIAGNOSIS:  Right posterior shoulder fracture, dislocation.  PROCEDURE:  Closed reduction under conscious sedation of right posterior shoulder fracture, dislocation in the emergency room.  SURGEON:  Lind Guest. Ninfa Linden, M.D.  ANESTHESIA:  Conscious sedation using ketamine provided by the ED MD.  BLOOD LOSS:  Not applicable.  COMPLICATIONS:  None.  INDICATIONS:  [name] is a 68 year old gentleman who around midnight sustained a likely seizure and he fell down.  He had obvious deformity of his right shoulder and pain.  He was brought to the [facility] Emergency Room, and x-rays and CT scan did reveal a posterior shoulder dislocation.  There was fracture fragments involving the humeral head and the greater tuberosity.  Orthopedic Surgery was consulted.  It was recommended he undergo an attempted closed reduction under conscious sedation in the emergency room.  Informed consent was obtained from his wife, they agreed first to try this procedure.  PROCEDURE DESCRIPTION:  After informed consent was obtained, I marked the right shoulder, I did clean the shoulder with Betadine, alcohol and provided an injection of 5 mL of 0.5% plain Marcaine into the glenohumeral joint.  We then applied traction and countertraction to the right shoulder and felt and hard an audible clunk.  I immediately obtained postreduction x-rays using portable x-ray and this showed a concentric reduction of the humeral head.  His arm was placed in a sling and he awakened from conscious  sedation without any issues.  I gave his wife instructions about followup in our office and staying in the sling for the next 2 weeks.  We also want him to avoid external rotation and abduction of that shoulder until further notice.  I also do not want to be reaching way across in front of himself.     Lind Guest. Ninfa Linden, M.D.     CYB/MEDQ  D:  08/03/2014  T:  08/03/2014  Job:  [id number]

## 2014-08-03 NOTE — Sedation Documentation (Signed)
Pt opening eyes on his own. Breathing unlabored at 18/min.

## 2014-08-03 NOTE — ED Notes (Addendum)
Per ems-- called for R shoulder injury. Upon ems arrival pt with bloody tongue and incontinence. Ems witnessed 1 seizure. sts seizure medication decreased approx 2 weeks ago. Ems administered 200 fentanyl total and 0.5 narcan d/t decreased resp drive after fentanyl.  ekg shows ST depression.

## 2014-08-03 NOTE — ED Notes (Signed)
Patient transported to CT 

## 2014-08-03 NOTE — ED Notes (Signed)
Dr. Rush Farmer explained procedure to pt. and family , family signed consent form . RT and ortho tech notified , crash cart on stand by .

## 2014-08-03 NOTE — ED Notes (Signed)
Ortho tech paged, respiratory therapist sarah, coming for conscious sedation.

## 2014-08-03 NOTE — ED Notes (Addendum)
Pt was up to sit on side of bed, pt unable to sit up on side, was falling over, felt dizzy. Pt lying back into bed. Will wait for discharge. Also, pt has 3 inch indentation noted to left forehead. Dr. Vanita Panda made aware, also pt wife reports she has not noted this prior.

## 2014-08-03 NOTE — ED Provider Notes (Signed)
Patient remains nauseous and wife now notes that there is a visible lesion on the L frontal area.  There is a depressed linear area that is grossly visible.  With unclear events about the fall / seizure, and with persistent n/v, CT scan will be performed.  Update: Patient upright.  CT results reviewed with him and his wife. No E/O ICH or fracture.    Carmin Muskrat, MD 08/03/14 [phone]

## 2014-08-03 NOTE — ED Notes (Signed)
Pt has returned from being out of the department; pt placed on monitor, continuous pulse, blood pressure cuff and oxygen [place] (3L); wife at bedside

## 2014-08-03 NOTE — ED Notes (Signed)
Family at bedside. 

## 2014-08-03 NOTE — Discharge Instructions (Signed)
Shoulder Dislocation Douglas Lane, you were seen today after a seizure and fall. You have a humerus fracture and he dislocated his shoulder. This was put back into place, continue wearing the sling until you see the orthopedic surgeons. The follow-up with neurology regarding her medications for seizure. Continue to take Keppra as prescribed. If any symptoms worsen come back to emergency department and medially for repeat evaluation. Thank you.  Shoulder dislocation is when your upper arm bone (humerus) is forced out of your shoulder joint. Your doctor will put your shoulder back into the joint by pulling on your arm or through surgery. Your arm will be placed in a shoulder immobilizer or sling. The shoulder immobilizer or sling holds your shoulder in place while it heals. HOME CARE   Rest your injured joint. Do not move it until instructed to do so.  Put ice on your injured joint as told by your doctor.  Put ice in a plastic bag.  Place a towel between your skin and the bag.  Leave the ice on for 15-20 minutes at a time, every 2 hours while you are awake.  Only take medicines as told by your doctor.  Squeeze a ball to exercise your hand. GET HELP RIGHT AWAY IF:   Your splint or sling becomes damaged.  Your pain becomes worse, not better.  You lose feeling in your arm or hand.  Your arm or hand becomes white or cold. MAKE SURE YOU:   Understand these instructions.  Will watch your condition.  Will get help right away if you are not doing well or get worse. Document Released: 10/25/2011 Document Reviewed: 10/25/2011 [facility] Patient Information 2015 [place]. This information is not intended to replace advice given to you by your health care provider. Make sure you discuss any questions you have with your health care provider. Humerus Fracture, Treated with Immobilization The humerus is the large bone in the upper arm. A broken (fractured) humerus is often treated by wearing  a cast, splint, or sling (immobilization). This holds the broken pieces in place so they can heal.  HOME CARE  Put ice on the injured area.  Put ice in a plastic bag.  Place a towel between your skin and the bag.  Leave the ice on for 15-20 minutes, 03-04 times a day.  If you are given a cast:  Do not scratch the skin under the cast.  Check the skin around the cast every day. You may put lotion on any red or sore areas.  Keep the cast dry and clean.  If you are given a splint:  Wear the splint as told.  Keep the splint clean and dry.  Loosen the elastic around the splint if your fingers become numb, cold, tingle, or turn blue.  If you are given a sling:  Wear the sling as told.  Do not put pressure on any part of the cast or splint until it is fully hardened.  The cast or splint must be protected with a plastic bag during bathing. Do not lower the cast or splint into water.  Only take medicine as told by your doctor.  Do exercises as told by your doctor.  Follow up as told by your doctor. GET HELP RIGHT AWAY IF:   Your skin or fingernails turn blue or gray.  Your arm feels cold or numb.  You have very bad pain in the injured arm.  You are having problems with the medicines you were given. MAKE  SURE YOU:   Understand these instructions.  Will watch your condition.  Will get help right away if you are not doing well or get worse. Document Released: 01/19/2008 Document Revised: 10/25/2011 Document Reviewed: 09/16/2010 [facility] Patient Information 2015 [place], [place]. This information is not intended to replace advice given to you by your health care provider. Make sure you discuss any questions you have with your health care provider.

## 2014-08-03 NOTE — ED Notes (Signed)
At time of d/c pt. Able to sit on side of bed independently, stand and pivot to wheelchair.

## 2014-08-20 ENCOUNTER — Encounter: Payer: Self-pay | Admitting: Neurology

## 2014-08-20 ENCOUNTER — Ambulatory Visit (INDEPENDENT_AMBULATORY_CARE_PROVIDER_SITE_OTHER): Payer: Medicare PPO | Admitting: Neurology

## 2014-08-20 VITALS — BP 140/74 | HR 65 | Resp 16 | Ht 67.0 in | Wt 183.0 lb

## 2014-08-20 DIAGNOSIS — G40009 Localization-related (focal) (partial) idiopathic epilepsy and epileptic syndromes with seizures of localized onset, not intractable, without status epilepticus: Secondary | ICD-10-CM

## 2014-08-20 NOTE — Patient Instructions (Signed)
1. Continue Keppra 500mg  twice a day 2. Call our office for any changes in symptoms 3. Follow-up in 3-4 months  Seizure Precautions: 1. If medication has been prescribed for you to prevent seizures, take it exactly as directed.  Do not stop taking the medicine without talking to your doctor first, even if you have not had a seizure in a long time.   2. Avoid activities in which a seizure would cause danger to yourself or to others.  Don't operate dangerous machinery, swim alone, or climb in high or dangerous places, such as on ladders, roofs, or girders.  Do not drive unless your doctor says you may.  3. If you have any warning that you may have a seizure, lay down in a safe place where you can't hurt yourself.    4.  No driving for 6 months from last seizure, as per [facility].   Please refer to the following link on the [place] website for more information: http://www.epilepsyfoundation.org/answerplace/Social/driving/drivingu.cfm   5.  Maintain good sleep hygiene.  6.  Contact your doctor if you have any problems that may be related to the medicine you are taking.  7.  Call 911 and bring the patient back to the ED if:        A.  The seizure lasts longer than 5 minutes.       B.  The patient doesn't awaken shortly after the seizure  C.  The patient has new problems such as difficulty seeing, speaking or moving  D.  The patient was injured during the seizure  E.  The patient has a temperature over 102 F (39C)  F.  The patient vomited and now is having trouble breathing

## 2014-08-20 NOTE — Progress Notes (Signed)
NEUROLOGY CONSULTATION NOTE  Douglas Lane MRN: [id number] DOB: 1946/08/05  Referring provider: Dr. Gus Height Primary care provider: Dr. Gus Height  Reason for consult:  Recent seizure  Dear Dr Harrington Challenger:  Thank you for your kind referral of Douglas Lane for consultation of the above symptoms. Although his history is well known to you, please allow me to reiterate it for the purpose of our medical record. The patient was accompanied to the clinic by his wife who also provides collateral information. Records and images were personally reviewed where available.  HISTORY OF PRESENT ILLNESS: This is a pleasant 69 year old right-handed man with a history of 2 seizures in 2012 that occurred 6 months apart, presenting after ER visit for a seizure last 08/03/14. The first seizure occurred in June 2012. He recalls it was a stressful period just soon after he retired. He had a nocturnal convulsion and was brought to [facility]. He was not started on medication initially as it was his first seizure. On December 2012, he had another nocturnal convulsion and was started on Keppra 500mg  BID with no further seizures for 3 years.  He and his neurologist in [facility] agreed to start weaning off Keppra, and this was completely discontinued the spring/summer of 2015. Around 2-1/2 years ago, he had also been reporting anxiety to his neurologist, and was started on clorazepate 7.5mg , weaned to 1/2 tab for 6 months, then discontinued 3 days prior to the most recent nocturnal seizure on 12/16 off AEDs. He was brought to [facility] ER, there is no bloodwork or drug screen for review, CT head unremarkable.He was found to have posterior dislocation and comminuted fracture of the right humeral head. He had soft tissue injury extending along the right anterior proximal arm. This was reduced in the ER and he continues to wear a sling. He was restarted on Keppra 500mg  BID with no side effects except for mild drowsiness.    His wife reports that since coming off the [place] last year, he has had 3 incidences of memory lapses, where he could not recall the last few hours. His wife reports that he would look confused but he can function and drive. The last episode occurred 6 weeks ago, he did not recall being in church. His wife reports he was talking fine but had a different look about him. This lasted about 1-2 hours. He had neuropsychological testing for memory loss and was told he has "short-term memory problems."  In hindsight, when asked about an MRI done in January 2012, he had an episode where he had transient memory los while he was at a [place]. I personally reviewed MRI brain without contrast done which showed mild chronic microvascular changes in the bilateral subcortical regions and right central pons.  He denies any episodes of staring/unresponsiveness, no olfactory/gustatory hallucinations, deja vu, rising epigastric sensation, focal numbness/tingling/weakness, myoclonic jerks. He has 1-4 drinks daily (beer or whisky), with no change in pattern for many years. He denies any sleep deprivation prior to the seizures, but reports these being stressful periods. He continues to have anxiety and has "always been a Research officer, trade [employer]."   Epilepsy Risk Factors:  He had a normal birth and early development.  There is no history of febrile convulsions, CNS infections such as meningitis/encephalitis, significant traumatic brain injury, neurosurgical procedures, or family history of seizures.  No prior EEG available for review.  PAST MEDICAL HISTORY: Past Medical History  Diagnosis Date  . Sleep apnea   .  Seizure   . Psoriasis   . Thrombosed hemorrhoids     PAST SURGICAL HISTORY: Past Surgical History  Procedure Laterality Date  . Lumbar fusion  2004  . I&d hemorrhoirds    . Shoulder surgery      right    MEDICATIONS: Current Outpatient Prescriptions on File Prior to Visit  Medication Sig Dispense Refill  .  aspirin 81 MG tablet Take 81 mg by mouth daily.    . Hydrocortisone Acetate (PROCTOZONE H RE) Place rectally as needed.    . levETIRAcetam (KEPPRA) 500 MG tablet Take 1 tablet (500 mg total) by mouth 2 (two) times daily. 20 tablet 0  . HYDROcodone-acetaminophen (NORCO/VICODIN) 5-325 MG per tablet Take 2 tablets by mouth 2 (two) times daily as needed for severe pain. (Patient not taking: Reported on 08/20/2014) 15 tablet 0  . ondansetron (ZOFRAN ODT) 4 MG disintegrating tablet Take 1 tablet (4 mg total) by mouth every 8 (eight) hours as needed for nausea or vomiting. (Patient not taking: Reported on 08/20/2014) 20 tablet 0   No current facility-administered medications on file prior to visit.    ALLERGIES: No Known Allergies  FAMILY HISTORY: No family history on file.  SOCIAL HISTORY: History   Social History  . Marital Status: Married    Spouse Name: N/A    Number of Children: N/A  . Years of Education: N/A   Occupational History  . Not on file.   Social History Main Topics  . Smoking status: Never Smoker   . Smokeless tobacco: Not on file  . Alcohol Use: 0.0 oz/week    1-4 drinks daily  . Drug Use: No  . Sexual Activity: Not on file   Other Topics Concern  . Not on file   Social History Narrative    REVIEW OF SYSTEMS: Constitutional: No fevers, chills, or sweats, no generalized fatigue, change in appetite Eyes: No visual changes, double vision, eye pain Ear, nose and throat: No hearing loss, ear pain, nasal congestion, sore throat Cardiovascular: No chest pain, palpitations Respiratory:  No shortness of breath at rest or with exertion, wheezes GastrointestinaI: No nausea, vomiting, diarrhea, abdominal pain, fecal incontinence Genitourinary:  No dysuria, urinary retention or frequency Musculoskeletal:  No neck pain, back pain. + Right shoulder pain Integumentary: No rash, pruritus, skin lesions Neurological: as above Psychiatric: No depression, insomnia,  anxiety Endocrine: No palpitations, fatigue, diaphoresis, mood swings, change in appetite, change in weight, increased thirst Hematologic/Lymphatic:  No anemia, purpura, petechiae. Allergic/Immunologic: no itchy/runny eyes, nasal congestion, recent allergic reactions, rashes  PHYSICAL EXAM: Filed Vitals:   08/20/14 1025  BP: 140/74  Pulse: 65  Resp: 16   General: No acute distress Head:  Normocephalic/atraumatic Eyes: Fundoscopic exam shows bilateral sharp discs, no vessel changes, exudates, or hemorrhages Neck: supple, no paraspinal tenderness, full range of motion Back: No paraspinal tenderness Heart: regular rate and rhythm Lungs: Clear to auscultation bilaterally. Vascular: No carotid bruits. Skin/Extremities: No rash, no edema Neurological Exam: Mental status: alert and oriented to person, place, and time, no dysarthria or aphasia, Fund of knowledge is appropriate.  Recent and remote memory are intact . 2/3 delayed recall.  Attention and concentration are normal.    Able to name objects and repeat phrases. Cranial nerves: CN I: not tested CN II: pupils equal, round and reactive to light, visual fields intact, fundi unremarkable. CN III, IV, VI:  full range of motion, no nystagmus, no ptosis CN V: facial sensation intact CN VII: upper and lower face  symmetric CN VIII: hearing intact to finger rub CN IX, X: gag intact, uvula midline CN XI: sternocleidomastoid and trapezius muscles intact CN XII: tongue midline Bulk & Tone: normal, no fasciculations. Motor: 5/5 throughout with no pronator drift, except for right shoulder pain causing difficulty with extension at the shoulder Sensation: intact to light touch, cold, pin, vibration and joint position sense.  No extinction to double simultaneous stimulation.  Romberg test negative Deep Tendon Reflexes: +2 throughout, no ankle clonus Plantar responses: downgoing bilaterally Cerebellar: no incoordination on finger to nose, heel to  shin. No dysdiadochokinesia Gait: narrow-based and steady, able to tandem walk adequately. Tremor: none  IMPRESSION: This is a pleasant 69 year old right-handed man with a history of 2 seizures in 2012. As he had been seizure-free for 3 years, Keppra was tapered off last spring/summer. He had been taking clorazepate for anxiety and was tapered off this 3 days prior to the most recent nocturnal seizure last 08/03/14. 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 He is now back on Keppra 500mg  BID. Records from his neurologist in [facility] will be requested for review. He knows to call our office if episodes of loss of time recur, at which point Keppra dose will be increased. We discussed avoidance of seizure triggers, including missed medications, alcohol, and sleep deprivation. [place] driving laws were discussed with the patient, and he knows to stop driving after a seizure, until 6 months seizure-free. He will follow-up in 3-4 months.  Thank you for allowing me to participate in the care of this patient. Please do not hesitate to call for any questions or concerns.   Ellouise Newer, M.D.  CC: Dr. Harrington Challenger

## 2014-08-23 ENCOUNTER — Encounter: Payer: Self-pay | Admitting: Neurology

## 2014-08-23 DIAGNOSIS — G40009 Localization-related (focal) (partial) idiopathic epilepsy and epileptic syndromes with seizures of localized onset, not intractable, without status epilepticus: Secondary | ICD-10-CM | POA: Insufficient documentation

## 2014-09-09 ENCOUNTER — Telehealth: Payer: Self-pay | Admitting: Neurology

## 2014-09-09 NOTE — Telephone Encounter (Signed)
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  EEG 02/10/11 showed bilateral temporal slowing. MRI no change from January 2012.  08/23/11: seizure at 1:30am 03/28/14: Keppra 261m BID with no seizures since Sept/Oct 2014. Weaned off Keppra.

## 2014-09-11 ENCOUNTER — Ambulatory Visit: Payer: Medicare Other | Admitting: Neurology

## 2014-11-19 ENCOUNTER — Encounter: Payer: Self-pay | Admitting: Neurology

## 2014-11-19 ENCOUNTER — Ambulatory Visit (INDEPENDENT_AMBULATORY_CARE_PROVIDER_SITE_OTHER): Payer: Medicare PPO | Admitting: Neurology

## 2014-11-19 VITALS — BP 140/82 | HR 63 | Resp 16 | Ht 67.0 in | Wt 177.0 lb

## 2014-11-19 DIAGNOSIS — G40009 Localization-related (focal) (partial) idiopathic epilepsy and epileptic syndromes with seizures of localized onset, not intractable, without status epilepticus: Secondary | ICD-10-CM | POA: Diagnosis not present

## 2014-11-19 NOTE — Patient Instructions (Signed)
1. Continue Keppra 500mg  twice a day 2. Contact our office for any change in symptoms such as prolonged memory lapses 3. Follow-up in 3-4 months  Seizure Precautions: 1. If medication has been prescribed for you to prevent seizures, take it exactly as directed.  Do not stop taking the medicine without talking to your doctor first, even if you have not had a seizure in a long time.   2. Avoid activities in which a seizure would cause danger to yourself or to others.  Don't operate dangerous machinery, swim alone, or climb in high or dangerous places, such as on ladders, roofs, or girders.  Do not drive unless your doctor says you may.  3. If you have any warning that you may have a seizure, lay down in a safe place where you can't hurt yourself.    4.  No driving for 6 months from last seizure, as per [facility].   Please refer to the following link on the [place] website for more information: http://www.epilepsyfoundation.org/answerplace/Social/driving/drivingu.cfm   5.  Maintain good sleep hygiene.  6.  Contact your doctor if you have any problems that may be related to the medicine you are taking.  7.  Call 911 and bring the patient back to the ED if:        A.  The seizure lasts longer than 5 minutes.       B.  The patient doesn't awaken shortly after the seizure  C.  The patient has new problems such as difficulty seeing, speaking or moving  D.  The patient was injured during the seizure  E.  The patient has a temperature over 102 F (39C)  F.  The patient vomited and now is having trouble breathing

## 2014-11-19 NOTE — Progress Notes (Signed)
NEUROLOGY FOLLOW UP OFFICE NOTE  Douglas Lane [id number]  HISTORY OF PRESENT ILLNESS: I had the pleasure of seeing Douglas Lane in follow-up in the neurology clinic on 11/19/2014.  The patient was last seen 3 months ago for recurrent seizures. He had been seizure-free for a year off Keppra, and had tapered of clorazepate which he was taking for anxiety. He had a generalized convulsion on 07/31/14, 3 days after stopping clorazepate. He was restarted on Keppra 500mg  BID. His wife had also reported that after stopping the Keppra, he had prolonged episodes of memory lapses where he could not recall the last few hours. No further similar spells since restarting Keppra. He is very irritable in the office today, expressing frustration with his current situation of being unable to drive. He reports feeling old and hates the loss of independence. He denies any headaches, dizziness, focal numbness/tingling/weakness. He has left knee pain.   HPI: This is a 69 yo RH man with a history of 2 seizures in 2012 that occurred 6 months apart, presenting after ER visit for a seizure last 08/03/14. The first seizure occurred in June 2012. He recalls it was a stressful period just soon after he retired. He had a nocturnal convulsion and was brought to [facility]. He was not started on medication initially as it was his first seizure. On December 2012, he had another nocturnal convulsion and was started on Keppra 500mg  BID with no further seizures for 3 years. He and his neurologist in [facility] agreed to start weaning off Keppra, and this was completely discontinued the spring/summer of 2015. Around 2-1/2 years ago, he had also been reporting anxiety to his neurologist, and was started on clorazepate 7.5mg , weaned to 1/2 tab for 6 months, then discontinued 3 days prior to the most recent nocturnal seizure on 12/16 off AEDs. He was brought to [facility] ER, there is no bloodwork or drug screen for review, CT head  unremarkable.He was found to have posterior dislocation and comminuted fracture of the right humeral head. He had soft tissue injury extending along the right anterior proximal arm. This was reduced in the ER and he continues to wear a sling. He was restarted on Keppra 500mg  BID with no side effects except for mild drowsiness.   His wife reports that since coming off the [place] last year, he has had 3 incidences of memory lapses, where he could not recall the last few hours. His wife reports that he would look confused but he can function and drive. The last episode occurred 6 weeks ago, he did not recall being in church. His wife reports he was talking fine but had a different look about him. This lasted about 1-2 hours. He had neuropsychological testing for memory loss and was told he has "short-term memory problems." In hindsight, when asked about an MRI done in January 2012, he had an episode where he had transient memory los while he was at a [employer]. I personally reviewed MRI brain without contrast done which showed mild chronic microvascular changes in the bilateral subcortical regions and right central pons.  He denies any episodes of staring/unresponsiveness, no olfactory/gustatory hallucinations, deja vu, rising epigastric sensation, focal numbness/tingling/weakness, myoclonic jerks. He has 1-4 drinks daily (beer or whisky), with no change in pattern for many years. He denies any sleep deprivation prior to the seizures, but reports these being stressful periods. He continues to have anxiety and has "always been a Research officer, trade union."   Epilepsy Risk Factors:  He had a normal birth and early development. There is no history of febrile convulsions, CNS infections such as meningitis/encephalitis, significant traumatic brain injury, neurosurgical procedures, or family history of seizures.  PAST MEDICAL HISTORY: Past Medical History  Diagnosis Date  . Sleep apnea   . Seizure   . Psoriasis   . Thrombosed  hemorrhoids     MEDICATIONS: Current Outpatient Prescriptions on File Prior to Visit  Medication Sig Dispense Refill  . aspirin 81 MG tablet Take 81 mg by mouth daily.    Marland Kitchen ibuprofen (ADVIL,MOTRIN) 200 MG tablet Take 200 mg by mouth. 3 tablets as needed    . levETIRAcetam (KEPPRA) 500 MG tablet Take 1 tablet (500 mg total) by mouth 2 (two) times daily. 20 tablet 0   No current facility-administered medications on file prior to visit.    ALLERGIES: No Known Allergies  FAMILY HISTORY: No family history on file.  SOCIAL HISTORY: History   Social History  . Marital Status: Married    Spouse Name: N/A  . Number of Children: N/A  . Years of Education: N/A   Occupational History  . Not on file.   Social History Main Topics  . Smoking status: Never Smoker   . Smokeless tobacco: Not on file  . Alcohol Use: 0.0 oz/week    0 Standard drinks or equivalent per week  . Drug Use: No  . Sexual Activity: Not on file   Other Topics Concern  . Not on file   Social History Narrative    REVIEW OF SYSTEMS: Constitutional: No fevers, chills, or sweats, no generalized fatigue, change in appetite Eyes: No visual changes, double vision, eye pain Ear, nose and throat: No hearing loss, ear pain, nasal congestion, sore throat Cardiovascular: No chest pain, palpitations Respiratory:  No shortness of breath at rest or with exertion, wheezes GastrointestinaI: No nausea, vomiting, diarrhea, abdominal pain, fecal incontinence Genitourinary:  No dysuria, urinary retention or frequency Musculoskeletal:  No neck pain, back pain, +left knee pain Integumentary: No rash, pruritus, skin lesions Neurological: as above Psychiatric: No depression, insomnia, anxiety Endocrine: No palpitations, fatigue, diaphoresis, mood swings, change in appetite, change in weight, increased thirst Hematologic/Lymphatic:  No anemia, purpura, petechiae. Allergic/Immunologic: no itchy/runny eyes, nasal congestion, recent  allergic reactions, rashes  PHYSICAL EXAM: Filed Vitals:   11/19/14 1018  BP: 140/82  Pulse: 63  Resp: 16   General: No acute distress Head:  Normocephalic/atraumatic Neck: supple, no paraspinal tenderness, full range of motion Heart:  Regular rate and rhythm Lungs:  Clear to auscultation bilaterally Back: No paraspinal tenderness Skin/Extremities: No rash, no edema Neurological Exam: alert and oriented to person, place, and time. No aphasia or dysarthria. Fund of knowledge is appropriate.  Recent and remote memory are intact.  Attention and concentration are normal.    Able to name objects and repeat phrases. Cranial nerves: Pupils equal, round, reactive to light.  Fundoscopic exam unremarkable, no papilledema. Extraocular movements intact with no nystagmus. Visual fields full. Facial sensation intact. No facial asymmetry. Tongue, uvula, palate midline.  Motor: Bulk and tone normal, muscle strength 5/5 throughout with no pronator drift.  Sensation to light touch intact.  No extinction to double simultaneous stimulation.  Deep tendon reflexes 2+ throughout, toes downgoing.  Finger to nose testing intact.  Gait narrow-based and steady, able to tandem walk adequately.  Romberg negative.  IMPRESSION: This is a 69 yo RH man with a history of 2 seizures in 2012. As he had been seizure-free for 3 years,  Keppra was tapered off last spring/summer 2015. He had been taking clorazepate for anxiety and was tapered off this 3 days prior to the most recent nocturnal seizure last 08/03/14. 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 He is now back on Keppra 500mg  BID with no further similar episodes. He is very irritable today due to frustration from losing driving privileges. I wonder if some of this may be a side effect of Keppra. We again discussed [place] driving laws that indicate one has to stop driving after a seizure, until 6 months  seizure-free. We will re-evaluate mood changes once he is back to driving 6 months seizure-free. He will follow-up in 3-4 months.  Thank you for allowing me to participate in his care.  Please do not hesitate to call for any questions or concerns.  The duration of this appointment visit was 15 minutes of face-to-face time with the patient.  Greater than 50% of this time was spent in counseling, explanation of diagnosis, planning of further management, and coordination of care.   Ellouise Newer, M.D.   CC: Dr. Harrington Challenger

## 2015-02-27 ENCOUNTER — Encounter: Payer: Self-pay | Admitting: Neurology

## 2015-02-27 ENCOUNTER — Ambulatory Visit (INDEPENDENT_AMBULATORY_CARE_PROVIDER_SITE_OTHER): Payer: Medicare PPO | Admitting: Neurology

## 2015-02-27 VITALS — BP 130/78 | HR 69 | Ht 66.0 in | Wt 183.0 lb

## 2015-02-27 DIAGNOSIS — G40009 Localization-related (focal) (partial) idiopathic epilepsy and epileptic syndromes with seizures of localized onset, not intractable, without status epilepticus: Secondary | ICD-10-CM | POA: Diagnosis not present

## 2015-02-27 NOTE — Progress Notes (Signed)
NEUROLOGY FOLLOW UP OFFICE NOTE  Douglas Lane [id number]  HISTORY OF PRESENT ILLNESS: I had the pleasure of seeing Douglas Lane in follow-up in the neurology clinic on 02/27/2015.  The patient was last seen 3 months ago recurrent seizures. He had been seizure-free for a year off Keppra, and had tapered of clorazepate which he was taking for anxiety. He had a generalized convulsion on 07/31/14, 3 days after stopping clorazepate. He was restarted on Keppra 500mg  BID. His wife had also reported that after stopping the Keppra, he had prolonged episodes of memory lapses where he could not recall the last few hours. No further similar spells since restarting Keppra.   Since his last visit, he denies any seizures or seizure-like symptoms since December 2015. He is back to driving. He is not in a good mood today reporting pain in his joints, particularly the left knee. He denies any headaches, dizziness, diplopia, focal numbness/tingling/weakness, olfactory/gustatory hallucinations, confusional episodes, or gaps in time. He is tolerating Keppra 500mg  BID without side effects.  He reports there were a couple of times since his last visit that he was "down in the dumps," otherwise he denies feeling depressed except for once a month or so.  HPI: This is a 69 yo RH man with a history of 2 seizures in 2012 that occurred 6 months apart, presenting after ER visit for a seizure last 08/03/14. The first seizure occurred in June 2012. He recalls it was a stressful period just soon after he retired. He had a nocturnal convulsion and was brought to [facility]. He was not started on medication initially as it was his first seizure. On December 2012, he had another nocturnal convulsion and was started on Keppra 500mg  BID with no further seizures for 3 years. He and his neurologist in [facility] agreed to start weaning off Keppra, and this was completely discontinued the spring/summer of 2015. Around 2-1/2 years  ago, he had also been reporting anxiety to his neurologist, and was started on clorazepate 7.5mg , weaned to 1/2 tab for 6 months, then discontinued 3 days prior to the most recent nocturnal seizure on 12/16 off AEDs. He was brought to [facility] ER, there is no bloodwork or drug screen for review, CT head unremarkable.He was found to have posterior dislocation and comminuted fracture of the right humeral head. He had soft tissue injury extending along the right anterior proximal arm. This was reduced in the ER and he continues to wear a sling. He was restarted on Keppra 500mg  BID with no side effects except for mild drowsiness.   His wife reports that since coming off the [place] last year, he has had 3 incidences of memory lapses, where he could not recall the last few hours. His wife reports that he would look confused but he can function and drive. The last episode occurred 6 weeks ago, he did not recall being in church. His wife reports he was talking fine but had a different look about him. This lasted about 1-2 hours. He had neuropsychological testing for memory loss and was told he has "short-term memory problems." In hindsight, when asked about an MRI done in January 2012, he had an episode where he had transient memory los while he was at a [employer]. I personally reviewed MRI brain without contrast done which showed mild chronic microvascular changes in the bilateral subcortical regions and right central pons.  He denies any episodes of staring/unresponsiveness, no olfactory/gustatory hallucinations, deja vu, rising epigastric  sensation, focal numbness/tingling/weakness, myoclonic jerks. He has 1-4 drinks daily (beer or whisky), with no change in pattern for many years. He denies any sleep deprivation prior to the seizures, but reports these being stressful periods. 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 There is  no history of febrile convulsions, CNS infections such as meningitis/encephalitis, significant traumatic brain injury, neurosurgical procedures, or family history of seizures.   PAST MEDICAL HISTORY: Past Medical History  Diagnosis Date  . Sleep apnea   . Seizure   . Psoriasis   . Thrombosed hemorrhoids     MEDICATIONS: Current Outpatient Prescriptions on File Prior to Visit  Medication Sig Dispense Refill  . aspirin 81 MG tablet Take 81 mg by mouth daily.    . clobetasol ointment (TEMOVATE) 0.05 % Daily  1  . ibuprofen (ADVIL,MOTRIN) 200 MG tablet Take 200 mg by mouth. 3 tablets as needed    . levETIRAcetam (KEPPRA) 500 MG tablet Take 1 tablet (500 mg total) by mouth 2 (two) times daily. 20 tablet 0   No current facility-administered medications on file prior to visit.    ALLERGIES: No Known Allergies  FAMILY HISTORY: No family history on file.  SOCIAL HISTORY: History   Social History  . Marital Status: Married    Spouse Name: N/A  . Number of Children: N/A  . Years of Education: N/A   Occupational History  . Not on file.   Social History Main Topics  . Smoking status: Never Smoker   . Smokeless tobacco: Not on file  . Alcohol Use: 0.0 oz/week    0 Standard drinks or equivalent per week  . Drug Use: No  . Sexual Activity: Not on file   Other Topics Concern  . Not on file   Social History Narrative    REVIEW OF SYSTEMS: Constitutional: No fevers, chills, or sweats, no generalized fatigue, change in appetite Eyes: No visual changes, double vision, eye pain Ear, nose and throat: No hearing loss, ear pain, nasal congestion, sore throat Cardiovascular: No chest pain, palpitations Respiratory:  No shortness of breath at rest or with exertion, wheezes GastrointestinaI: No nausea, vomiting, diarrhea, abdominal pain, fecal incontinence Genitourinary:  No dysuria, urinary retention or frequency Musculoskeletal:  +joint pain, left knee pain Integumentary: No  rash, pruritus, skin lesions Neurological: as above Psychiatric: No depression, insomnia, anxiety Endocrine: No palpitations, fatigue, diaphoresis, mood swings, change in appetite, change in weight, increased thirst Hematologic/Lymphatic:  No anemia, purpura, petechiae. Allergic/Immunologic: no itchy/runny eyes, nasal congestion, recent allergic reactions, rashes  PHYSICAL EXAM: Filed Vitals:   02/27/15 1112  BP: 130/78  Pulse: 69   General: No acute distress Head:  Normocephalic/atraumatic Neck: supple, no paraspinal tenderness, full range of motion Heart:  Regular rate and rhythm Lungs:  Clear to auscultation bilaterally Back: No paraspinal tenderness Skin/Extremities: No rash, no edema Neurological Exam: alert and oriented to person, place, and time. No aphasia or dysarthria. Fund of knowledge is appropriate.  Recent and remote memory are intact. 3/3 delayed recall.  Attention and concentration are normal.    Able to name objects and repeat phrases. Cranial nerves: Pupils equal, round, reactive to light.  Fundoscopic exam unremarkable, no papilledema. Extraocular movements intact with no nystagmus. Visual fields full. Facial sensation intact. No facial asymmetry. Tongue, uvula, palate midline.  Motor: Bulk and tone normal, muscle strength 5/5 throughout with no pronator drift.  Sensation to light touch  intact.  No extinction to double simultaneous stimulation.  Deep tendon reflexes 2+ throughout, toes downgoing.  Finger to nose testing intact.  Gait narrow-based and steady, able to tandem walk adequately.  Romberg negative.  IMPRESSION: This is a 69 yo RH man with a history of 2 seizures in 2012. As he had been seizure-free for 3 years, Keppra was tapered off last spring/summer 2015. He had been taking clorazepate for anxiety and was tapered off this 3 days prior to the most recent nocturnal seizure last 08/03/14. They report episodes of loss of time while off Keppra, concerning for partial  seizures arising from the temporal lobe. He is now back on Keppra 500mg  BID with no further similar episodes. He is again noted to be irritable today, I wonder if some of this may be a side effect of Keppra. We discussed the option of seeing a psychiatrist/psychologist for mood, which he declines. He will call his orthopedist for the joint pains. He is aware of [place] driving laws that indicate one has to stop driving after a seizure, until 6 months seizure-free. He will call our office for medication refills and follow-up in 6 months or earlier if needed.   Thank you for allowing me to participate in his care.  Please do not hesitate to call for any questions or concerns.  The duration of this appointment visit was 14 minutes of face-to-face time with the patient.  Greater than 50% of this time was spent in counseling, explanation of diagnosis, planning of further management, and coordination of care.   Ellouise Newer, M.D.   CC: Dr. Harrington Challenger

## 2015-02-27 NOTE — Patient Instructions (Signed)
1. Continue current dose of Keppra, call our office for refills 2. Follow-up in 6 months, call our office for any changes in symptoms

## 2015-02-28 ENCOUNTER — Encounter: Payer: Self-pay | Admitting: *Deleted

## 2015-03-06 ENCOUNTER — Encounter: Payer: Self-pay | Admitting: Cardiology

## 2015-03-06 ENCOUNTER — Ambulatory Visit (INDEPENDENT_AMBULATORY_CARE_PROVIDER_SITE_OTHER): Payer: Medicare PPO | Admitting: Cardiology

## 2015-03-06 VITALS — BP 130/76 | HR 72 | Ht 66.0 in | Wt 181.0 lb

## 2015-03-06 DIAGNOSIS — I517 Cardiomegaly: Secondary | ICD-10-CM

## 2015-03-06 DIAGNOSIS — I00 Rheumatic fever without heart involvement: Secondary | ICD-10-CM | POA: Insufficient documentation

## 2015-03-06 DIAGNOSIS — R011 Cardiac murmur, unspecified: Secondary | ICD-10-CM | POA: Diagnosis not present

## 2015-03-06 DIAGNOSIS — R9431 Abnormal electrocardiogram [ECG] [EKG]: Secondary | ICD-10-CM | POA: Insufficient documentation

## 2015-03-06 NOTE — Patient Instructions (Addendum)
Medication Instructions:  Your physician recommends that you continue on your current medications as directed. Please refer to the Current Medication list given to you today.   Labwork: None  Testing/Procedures: Your physician has requested that you have an echocardiogram. Echocardiography is a painless test that uses sound waves to create images of your heart. It provides your doctor with information about the size and shape of your heart and how well your heart's chambers and valves are working. This procedure takes approximately one hour. There are no restrictions for this procedure.  Follow-Up: Your physician wants you to follow-up in: 1 year with Dr. Radford Pax. You will receive a reminder letter in the mail two months in advance. If you don't receive a letter, please call our office to schedule the follow-up appointment.   Any Other Special Instructions Will Be Listed Below (If Applicable).

## 2015-03-06 NOTE — Progress Notes (Signed)
Cardiology Office Note   Date:  03/06/2015   ID:  Glenwood, Revoir [birth year], MRN [id number]  PCP:   Melinda Crutch, MD    Chief Complaint  Patient presents with  . New Evaluation    Heart murmur      History of Present Illness: Douglas Lane is a 69 y.o. male who presents for evaluation of heart murmur.  He was recently see by his PCP and was found to have a heart murmur.  He has a history of dyslipidemia and seizure d/o. He says that he has always been told he has a heart murmur since being a teenager. He does have a history of Rheumatic fever as a child.   Dr. Harrington Challenger is concerned that his murmur is louder.   He denies any chest pain, LE edema, dizziness, palpitations or syncope.  He does have some problems with DOE when walking the dog or working in the yard but this has been stable for years.      Past Medical History  Diagnosis Date  . Sleep apnea   . Seizure   . Psoriasis   . Thrombosed hemorrhoids   . Rheumatic fever     as a child    Past Surgical History  Procedure Laterality Date  . Lumbar fusion  2004  . I&d hemorrhoirds    . Shoulder surgery      right     Current Outpatient Prescriptions  Medication Sig Dispense Refill  . aspirin 81 MG tablet Take 81 mg by mouth daily.    . clobetasol ointment (TEMOVATE) 0.05 % Daily  1  . ibuprofen (ADVIL,MOTRIN) 200 MG tablet Take 200 mg by mouth. 3 tablets as needed    . levETIRAcetam (KEPPRA) 500 MG tablet Take 1 tablet (500 mg total) by mouth 2 (two) times daily. 20 tablet 0   No current facility-administered medications for this visit.    Allergies:   Review of patient's allergies indicates no known allergies.    Social History:  The patient  reports that he has never smoked. He does not have any smokeless tobacco history on file. He reports that he drinks alcohol. He reports that he does not use illicit drugs.   Family History:  The patient's family history includes Congestive Heart  Failure in his father; Depression in his sister; Renal Disease in his mother.    ROS:  Please see the history of present illness.   Otherwise, review of systems are positive for none.   All other systems are reviewed and negative.    PHYSICAL EXAM: VS:  BP 130/76 mmHg  Pulse 72  Ht 5\' 6"  (1.676 m)  Wt 181 lb (82.101 kg)  BMI 29.23 kg/m2 , BMI Body mass index is 29.23 kg/(m^2). GEN: Well nourished, well developed, in no acute distress HEENT: normal Neck: no JVD, carotid bruits, or masses Cardiac: RRR; no rubs, or gallops,no edema.  2/6 SM at RUSB that is at least mid peaking with radiation to the left carotid.  2/6 holosystolic murmur at the apex Respiratory:  clear to auscultation bilaterally, normal work of breathing GI: soft, nontender, nondistended, + BS MS: no deformity or atrophy Skin: warm and dry, no rash Neuro:  Strength and sensation are intact Psych: euthymic mood, full affect   EKG:  EKG is ordered today. The ekg ordered today demonstrates NSR at 72bpm with LVH by reopl and  nonspecific ST abnormality most likely secondary to repolarization abnormality   Recent Labs: No results found for requested labs within last 365 days.    Lipid Panel No results found for: CHOL, TRIG, HDL, CHOLHDL, VLDL, LDLCALC, LDLDIRECT    Wt Readings from Last 3 Encounters:  03/06/15 181 lb (82.101 kg)  02/27/15 183 lb (83.008 kg)  11/19/14 177 lb (80.287 kg)        ASSESSMENT AND PLAN:  1.  Heart murmur - He actually has 2 murmurs on exam.  He has a systolic mumur at that RUSB c/w AS and a holosystolic murmur at the apex c/w MR.  He does have a history of rheumatic fever as a child.  He is completely asymptomatic.  I will get a 2D echo to assess further.  I have discussed possible etiologies of the murmurs at length with the patient today.   2.  Dyslipidemia - followed by PCP 3.  Abnormal EKG with LVH with repolarization abnormality - He does not have HTN so I am concerned that his  AS may be significant and resulted in the development of LVH.  Will assess with echo.  If no LVH then may need to consider stress testing given ST changes.   Current medicines are reviewed at length with the patient today.  The patient does not have concerns regarding medicines.  The following changes have been made:  no change  Labs/ tests ordered today: See above Assessment and Plan No orders of the defined types were placed in this encounter.     Disposition:   FU with me in 1 year  Signed, Sueanne Margarita, MD  03/06/2015 10:14 AM    [facility] [address], [place], [place]  [address] Phone: [phone]; Fax: [phone]

## 2015-03-11 ENCOUNTER — Ambulatory Visit (HOSPITAL_COMMUNITY): Payer: Medicare PPO | Attending: Cardiovascular Disease

## 2015-03-11 ENCOUNTER — Other Ambulatory Visit: Payer: Self-pay

## 2015-03-11 DIAGNOSIS — R011 Cardiac murmur, unspecified: Secondary | ICD-10-CM | POA: Diagnosis present

## 2015-03-11 DIAGNOSIS — I517 Cardiomegaly: Secondary | ICD-10-CM | POA: Diagnosis not present

## 2015-03-11 DIAGNOSIS — I352 Nonrheumatic aortic (valve) stenosis with insufficiency: Secondary | ICD-10-CM | POA: Insufficient documentation

## 2015-03-11 DIAGNOSIS — E785 Hyperlipidemia, unspecified: Secondary | ICD-10-CM | POA: Insufficient documentation

## 2015-03-13 ENCOUNTER — Telehealth: Payer: Self-pay

## 2015-03-13 DIAGNOSIS — I517 Cardiomegaly: Secondary | ICD-10-CM

## 2015-03-13 NOTE — Telephone Encounter (Signed)
Informed patient of results and verbal understanding expressed.  Repeat ECHO ordered to be scheduled in 1 year. Patient agrees with treatment plan. 

## 2015-03-13 NOTE — Telephone Encounter (Signed)
-----   Message from Sueanne Margarita, MD sent at 03/11/2015  8:01 PM EDT ----- Please let patient know that echo showed normal LVF with severe LVH which explains the LVH and ST changes on EKG, moderate AS, mild AR/MR - repeat echo in 1 year

## 2015-09-01 ENCOUNTER — Encounter: Payer: Self-pay | Admitting: Neurology

## 2015-09-01 ENCOUNTER — Ambulatory Visit (INDEPENDENT_AMBULATORY_CARE_PROVIDER_SITE_OTHER): Payer: Medicare Other | Admitting: Neurology

## 2015-09-01 VITALS — BP 138/84 | HR 61 | Ht 66.0 in | Wt 185.0 lb

## 2015-09-01 DIAGNOSIS — G40009 Localization-related (focal) (partial) idiopathic epilepsy and epileptic syndromes with seizures of localized onset, not intractable, without status epilepticus: Secondary | ICD-10-CM

## 2015-09-01 MED ORDER — LEVETIRACETAM 500 MG PO TABS: 500.0000 mg | ORAL_TABLET | Freq: Two times a day (BID) | ORAL | Status: DC

## 2015-09-01 NOTE — Progress Notes (Signed)
NEUROLOGY FOLLOW UP OFFICE NOTE  Douglas Lane [id number]  HISTORY OF PRESENT ILLNESS: I had the pleasure of seeing Douglas Lane in follow-up in the neurology clinic on 09/01/2015.  The patient was last seen 6 months ago recurrent seizures. He had been seizure-free for a year off Keppra, and had tapered of clorazepate which he was taking for anxiety. He had a generalized convulsion on 07/31/14, 3 days after stopping clorazepate. He was restarted on Keppra 500mg  BID. His wife had also reported that after stopping the Keppra, he had prolonged episodes of memory lapses where he could not recall the last few hours. No further similar spells since restarting Keppra.   Since his last visit, he denies any seizures or seizure-like symptoms since December 2015. He is back to driving. He denies any further memory lapses, no headaches, dizziness, diplopia, focal numbness/tingling/weakness, olfactory/gustatory hallucinations, confusional episodes, or gaps in time. He is tolerating Keppra 500mg  BID without side effects.  He reports being under a great deal of stress with family issues. No suicidal or homicidal ideation.  HPI 08/20/2014: This is a 70 yo RH man with a history of 2 seizures in 2012 that occurred 6 months apart, presenting after ER visit for a seizure last 72/19/15. The first seizure occurred in June 2012. He recalls it was a stressful period just soon after he retired. He had a nocturnal convulsion and was brought to [facility]. He was not started on medication initially as it was his first seizure. On December 2012, he had another nocturnal convulsion and was started on Keppra 500mg  BID with no further seizures for 3 years. He and his neurologist in [facility] agreed to start weaning off Keppra, and this was completely discontinued the spring/summer of 2015. Around 2-1/2 years ago, he had also been reporting anxiety to his neurologist, and was started on clorazepate 7.5mg , weaned to 1/2 tab  for 6 months, then discontinued 3 days prior to the most recent nocturnal seizure on 12/16 off AEDs. He was brought to [facility] ER, there is no bloodwork or drug screen for review, CT head unremarkable.He was found to have posterior dislocation and comminuted fracture of the right humeral head. He had soft tissue injury extending along the right anterior proximal arm. This was reduced in the ER and he continues to wear a sling. He was restarted on Keppra 500mg  BID with no side effects except for mild drowsiness.   His wife reports that since coming off the [place] last year, he has had 3 incidences of memory lapses, where he could not recall the last few hours. His wife reports that he would look confused but he can function and drive. The last episode occurred 6 weeks ago, he did not recall being in church. His wife reports he was talking fine but had a different look about him. This lasted about 1-2 hours. He had neuropsychological testing for memory loss and was told he has "short-term memory problems." In hindsight, when asked about an MRI done in January 2012, he had an episode where he had transient memory los while he was at a [employer]. I personally reviewed MRI brain without contrast done which showed mild chronic microvascular changes in the bilateral subcortical regions and right central pons.  He denies any episodes of staring/unresponsiveness, no olfactory/gustatory hallucinations, deja vu, rising epigastric sensation, focal numbness/tingling/weakness, myoclonic jerks. He has 1-4 drinks daily (beer or whisky), with no change in pattern for many years. He denies any sleep deprivation  prior to the seizures, but reports these being stressful periods. 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 There is no history of febrile convulsions, CNS infections such as meningitis/encephalitis, significant traumatic brain  injury, neurosurgical procedures, or family history of seizures.   PAST MEDICAL HISTORY: Past Medical History  Diagnosis Date  . Sleep apnea   . Seizure ([place])   . Psoriasis   . Thrombosed hemorrhoids   . Rheumatic fever     as a child    MEDICATIONS: Current Outpatient Prescriptions on File Prior to Visit  Medication Sig Dispense Refill  . aspirin 81 MG tablet Take 81 mg by mouth daily.    . clobetasol ointment (TEMOVATE) 0.05 % Apply topically as needed. Daily  1  . ibuprofen (ADVIL,MOTRIN) 200 MG tablet Take 200 mg by mouth. 3 tablets as needed    . levETIRAcetam (KEPPRA) 500 MG tablet Take 1 tablet (500 mg total) by mouth 2 (two) times daily. 20 tablet 0   No current facility-administered medications on file prior to visit.    ALLERGIES: No Known Allergies  FAMILY HISTORY: Family History  Problem Relation Age of Onset  . Congestive Heart Failure Father   . Renal Disease Mother   . Depression Sister     SOCIAL HISTORY: Social History   Social History  . Marital Status: Married    Spouse Name: N/A  . Number of Children: N/A  . Years of Education: N/A   Occupational History  . Not on file.   Social History Main Topics  . Smoking status: Never Smoker   . Smokeless tobacco: Not on file  . Alcohol Use: 0.0 oz/week    0 Standard drinks or equivalent per week  . Drug Use: No  . Sexual Activity: Not on file   Other Topics Concern  . Not on file   Social History Narrative    REVIEW OF SYSTEMS: Constitutional: No fevers, chills, or sweats, no generalized fatigue, change in appetite Eyes: No visual changes, double vision, eye pain Ear, nose and throat: No hearing loss, ear pain, nasal congestion, sore throat Cardiovascular: No chest pain, palpitations Respiratory:  No shortness of breath at rest or with exertion, wheezes GastrointestinaI: No nausea, vomiting, diarrhea, abdominal pain, fecal incontinence Genitourinary:  No dysuria, urinary retention or  frequency Musculoskeletal:  +joint pain, left knee pain Integumentary: No rash, pruritus, skin lesions Neurological: as above Psychiatric: No depression, insomnia, anxiety Endocrine: No palpitations, fatigue, diaphoresis, mood swings, change in appetite, change in weight, increased thirst Hematologic/Lymphatic:  No anemia, purpura, petechiae. Allergic/Immunologic: no itchy/runny eyes, nasal congestion, recent allergic reactions, rashes  PHYSICAL EXAM: Filed Vitals:   09/01/15 0951  BP: 138/84  Pulse: 61   General: No acute distress Head:  Normocephalic/atraumatic Neck: supple, no paraspinal tenderness, full range of motion Heart:  Regular rate and rhythm Lungs:  Clear to auscultation bilaterally Back: No paraspinal tenderness Skin/Extremities: No rash, no edema Neurological Exam: alert and oriented to person, place, and time. No aphasia or dysarthria. Fund of knowledge is appropriate.  Recent and remote memory are intact. 3/3 delayed recall.  Attention and concentration are normal.    Able to name objects and repeat phrases. Cranial nerves: Pupils equal, round, reactive to light.  Extraocular movements intact with no nystagmus. Visual fields full. Facial sensation intact. No facial asymmetry. Tongue, uvula, palate midline.  Motor: Bulk and tone normal, muscle strength 5/5 throughout  with no pronator drift.  Sensation to light touch intact.  No extinction to double simultaneous stimulation.  Deep tendon reflexes 2+ throughout, toes downgoing.  Finger to nose testing intact.  Gait slow and cautious due to bilateral knee pain, unable to tandem walk due to pain. Romberg negative.  IMPRESSION: This is a 70 yo RH man with a history of 2 seizures in 2012. As he had been seizure-free for 3 years, Keppra was tapered off last spring/summer 2015. He had been taking clorazepate for anxiety and was tapered off this 3 days prior to the most recent nocturnal seizure last 08/03/14. 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 He is now back on Keppra 500mg  BID with no further similar episodes. He is doing well with no significant side effects on Keppra. He reports some mood changes due to family stress and wonders about either starting a medication or seeing a therapist. He knows to call his PCP when he is ready t do either. He is aware of [place] driving laws that indicate one has to stop driving after a seizure, until 6 months seizure-free. He will call our office for medication refills and follow-up in 1 year or earlier if needed.   Thank you for allowing me to participate in his care.  Please do not hesitate to call for any questions or concerns.  The duration of this appointment visit was 24 minutes of face-to-face time with the patient.  Greater than 50% of this time was spent in counseling, explanation of diagnosis, planning of further management, and coordination of care.   Ellouise Newer, M.D.   CC: Dr. Harrington Challenger

## 2015-09-01 NOTE — Patient Instructions (Addendum)
1. Continue Keppra 500mg  twice a day 2. If you would like to consider starting medication for mood or therapy, you can speak to your Primary Care doctor as well 3. Follow-up in 1 year, call for any changes  Seizure Precautions: 1. If medication has been prescribed for you to prevent seizures, take it exactly as directed.  Do not stop taking the medicine without talking to your doctor first, even if you have not had a seizure in a long time.   2. Avoid activities in which a seizure would cause danger to yourself or to others.  Don't operate dangerous machinery, swim alone, or climb in high or dangerous places, such as on ladders, roofs, or girders.  Do not drive unless your doctor says you may.  3. If you have any warning that you may have a seizure, lay down in a safe place where you can't hurt yourself.    4.  No driving for 6 months from last seizure, as per [facility].   Please refer to the following link on the [place] website for more information: http://[email or web address].org/[email or web address]/driving/drivingu.cfm   5.  Maintain good sleep hygiene. Avoid alcohol.  6.  Contact your doctor if you have any problems that may be related to the medicine you are taking.  7.  Call 911 and bring the patient back to the ED if:        A.  The seizure lasts longer than 5 minutes.       B.  The patient doesn't awaken shortly after the seizure  C.  The patient has new problems such as difficulty seeing, speaking or moving  D.  The patient was injured during the seizure  E.  The patient has a temperature over 102 F (39C)  F.  The patient vomited and now is having trouble breathing

## 2015-09-04 ENCOUNTER — Other Ambulatory Visit: Payer: Self-pay | Admitting: Family Medicine

## 2015-09-04 DIAGNOSIS — G40009 Localization-related (focal) (partial) idiopathic epilepsy and epileptic syndromes with seizures of localized onset, not intractable, without status epilepticus: Secondary | ICD-10-CM

## 2015-09-04 MED ORDER — LEVETIRACETAM 500 MG PO TABS: 500.0000 mg | ORAL_TABLET | Freq: Two times a day (BID) | ORAL | Status: DC

## 2015-12-19 ENCOUNTER — Other Ambulatory Visit: Payer: Self-pay | Admitting: Family Medicine

## 2015-12-19 ENCOUNTER — Ambulatory Visit
Admission: RE | Admit: 2015-12-19 | Discharge: 2015-12-19 | Disposition: A | Discharge status: Home / Self Care | Payer: Medicare Other | Source: Ambulatory Visit | Attending: Family Medicine | Admitting: Family Medicine

## 2015-12-19 DIAGNOSIS — R059 Cough, unspecified: Secondary | ICD-10-CM

## 2015-12-19 DIAGNOSIS — R05 Cough: Secondary | ICD-10-CM

## 2016-01-15 ENCOUNTER — Encounter: Payer: Self-pay | Admitting: Family Medicine

## 2016-01-27 ENCOUNTER — Telehealth: Payer: Self-pay | Admitting: Neurology

## 2016-01-27 NOTE — Telephone Encounter (Signed)
Pt is on vacation and forgot his medication (keppra) and would like a refilled called in to the [facility] at [phone] pt phone number is [phone]

## 2016-01-27 NOTE — Telephone Encounter (Signed)
Rx called in for Keppra #60 with no refills.

## 2016-03-01 ENCOUNTER — Encounter: Payer: Self-pay | Admitting: Neurology

## 2016-03-02 NOTE — Telephone Encounter (Signed)
Spoke to patient, he was at church in the morning with friends 02/22/16. The last thing he recalls is standing at the end of the service, then his next thing he recalls is being in the car on the way home. He called wife on cell and told her where he was, but has no recollection of previous 30-74mins. Wife told him that he sounded confused, which he did because had planned to make stops but he did not do. No focal sx, no headaches, dizziness. There was a cup of coffee in the car that he probably got on his way out, does not recall getting this. Denies any missed meds, sleep deprivation, infection. There has been plenty of stress, related to wife's parents and travelling. Discussed increasing dose of Keppra 500mg  1-1/2 tabs BID. Patient expressed agreement. He just filled his 90-day Rx and will call us for updated Rx when he is close to due. Also discussed [place] driving laws that one should not drive for 6 months after an episode of loss of awareness. Patient expressed understanding. He will f/u as scheduled and call if any changes. May consider 48-hour EEG if he has another episode on higher dose Keppra.

## 2016-03-05 ENCOUNTER — Encounter: Payer: Self-pay | Admitting: Cardiology

## 2016-03-05 ENCOUNTER — Ambulatory Visit (INDEPENDENT_AMBULATORY_CARE_PROVIDER_SITE_OTHER): Payer: Medicare Other | Admitting: Cardiology

## 2016-03-05 VITALS — BP 144/82 | HR 62 | Ht 66.0 in | Wt 181.0 lb

## 2016-03-05 DIAGNOSIS — R079 Chest pain, unspecified: Secondary | ICD-10-CM | POA: Diagnosis not present

## 2016-03-05 DIAGNOSIS — I517 Cardiomegaly: Secondary | ICD-10-CM

## 2016-03-05 DIAGNOSIS — I350 Nonrheumatic aortic (valve) stenosis: Secondary | ICD-10-CM | POA: Diagnosis not present

## 2016-03-05 DIAGNOSIS — I00 Rheumatic fever without heart involvement: Secondary | ICD-10-CM

## 2016-03-05 NOTE — Patient Instructions (Addendum)
Medication Instructions:  Your physician recommends that you continue on your current medications as directed. Please refer to the Current Medication list given to you today.  --- If you need a refill on your cardiac medications before your next appointment, please call your pharmacy. ---  Labwork: None ordered  Testing/Procedures: Your physician has requested that you have an echocardiogram. Echocardiography is a painless test that uses sound waves to create images of your heart. It provides your doctor with information about the size and shape of your heart and how well your heart's chambers and valves are working. This procedure takes approximately one hour. There are no restrictions for this procedure.  Your physician has requested that you have en exercise stress myoview. For further information please visit HugeFiesta.tn. Please follow instruction sheet, as given.  Follow-Up: Your physician wants you to follow-up in: 1 year with Dr. Radford Pax.  You will receive a reminder letter in the mail two months in advance. If you don't receive a letter, please call our office to schedule the follow-up appointment.  Thank you for choosing [facility]!!      Any Other Special Instructions Will Be Listed Below (If Applicable).

## 2016-03-05 NOTE — Progress Notes (Signed)
Cardiology Office Note    Date:  03/05/2016   ID:  YAZN ETUE, DOB [birth year], MRN [id number]  PCP:   Melinda Crutch, MD  Cardiologist:  Fransico Him, MD   Chief Complaint  Patient presents with  . Aortic Stenosis    History of Present Illness:  Douglas Lane is a 70 y.o. male who presents for followup of of heart murmur. He has a history of dyslipidemia and seizure d/o. He  has a history of Rheumatic fever as a child. He was found to have moderate AS by echo.He is doing well today.   He denies any  LE edema, dizziness, palpitations or syncope. He does have some problems with DOE and tightness when walking the dog at first for about a 1/4 mile but then resolves and he continues to walk. This has been stable for years.     Past Medical History  Diagnosis Date  . Sleep apnea   . Seizure ([place])   . Psoriasis   . Thrombosed hemorrhoids   . Rheumatic fever     as a child  . Aortic stenosis     moderate by echo    Past Surgical History  Procedure Laterality Date  . Lumbar fusion  2004  . I&d hemorrhoirds    . Shoulder surgery      right    Current Medications: Outpatient Prescriptions Prior to Visit  Medication Sig Dispense Refill  . aspirin 81 MG tablet Take 81 mg by mouth daily.    . clobetasol ointment (TEMOVATE) 0.05 % Apply topically as needed. Daily  1  . ibuprofen (ADVIL,MOTRIN) 200 MG tablet Take 200 mg by mouth. 3 tablets as needed    . levETIRAcetam (KEPPRA) 500 MG tablet Take 1 tablet (500 mg total) by mouth 2 (two) times daily. (Patient taking differently: Take 750 mg by mouth 2 (two) times daily. ) 180 tablet 3   No facility-administered medications prior to visit.     Allergies:   Review of patient's allergies indicates no known allergies.   Social History   Social History  . Marital Status: Married    Spouse Name: N/A  . Number of Children: N/A  . Years of Education: N/A   Social History Main Topics  . Smoking status: Never Smoker   .  Smokeless tobacco: None  . Alcohol Use: 0.0 oz/week    0 Standard drinks or equivalent per week  . Drug Use: No  . Sexual Activity: Not Asked   Other Topics Concern  . None   Social History Narrative     Family History:  The patient's family history includes Congestive Heart Failure in his father; Depression in his sister; Renal Disease in his mother.   ROS:   Please see the history of present illness.    ROS All other systems reviewed and are negative.   PHYSICAL EXAM:   VS:  BP 144/82 mmHg  Pulse 62  Ht 5\' 6"  (1.676 m)  Wt 181 lb (82.101 kg)  BMI 29.23 kg/m2   GEN: Well nourished, well developed, in no acute distress HEENT: normal Neck: no JVD, carotid bruits, or masses Cardiac: RRR; no rubs, or gallops,no edema.  Intact distal pulses bilaterally. 2/6 SM at RUSB to LLSB Respiratory:  clear to auscultation bilaterally, normal work of breathing GI: soft, nontender, nondistended, + BS MS: no deformity or atrophy Skin: warm and dry, no rash Neuro:  Alert and Oriented x 3, Strength and sensation are intact Psych: euthymic  mood, full affect  Wt Readings from Last 3 Encounters:  03/05/16 181 lb (82.101 kg)  09/01/15 185 lb (83.915 kg)  03/06/15 181 lb (82.101 kg)      Studies/Labs Reviewed:   EKG:  EKG is ordered today.  The ekg ordered today demonstrates NSR at 62bpm with LVH with repolarization abnormality  Recent Labs: No results found for requested labs within last 365 days.   Lipid Panel No results found for: CHOL, TRIG, HDL, CHOLHDL, VLDL, LDLCALC, LDLDIRECT  Additional studies/ records that were reviewed today include:  none    ASSESSMENT:    1. Aortic stenosis   2. Chest pain, unspecified chest pain type   3. LVH (left ventricular hypertrophy)   4. Rheumatic fever      PLAN:  In order of problems listed above:  1. Moderate aortic stenosis by echo a year ago.  He is completely asymptomatic. Still appears moderate by exam.  I will recheck echo  for progression.  I have reviewed the symptoms that could indicate progression of his AS including CP, increased SOB, LE edema, dizziness or syncope.  He will let me know if this occurs, otherwise I will see him in 1 year. 2. Chest tightness with exertion.  He does have new ST changes in the lateral precordial leads but this could be explained by LVH with repol abnormality.  Given his chest tightness I will get a stress myoview to rule out ischemia.   3. LVH with repol abnormality 4. History of rheumatic fever    Medication Adjustments/Labs and Tests Ordered: Current medicines are reviewed at length with the patient today.  Concerns regarding medicines are outlined above.  Medication changes, Labs and Tests ordered today are listed in the Patient Instructions below.  Patient Instructions  Medication Instructions:  Your physician recommends that you continue on your current medications as directed. Please refer to the Current Medication list given to you today.  --- If you need a refill on your cardiac medications before your next appointment, please call your pharmacy. ---  Labwork: None ordered  Testing/Procedures: None ordered  Follow-Up: Your physician wants you to follow-up in: 1 year with Dr. Radford Pax.  You will receive a reminder letter in the mail two months in advance. If you don't receive a letter, please call our office to schedule the follow-up appointment.   Any Other Special Instructions Will Be Listed Below (If Applicable).  Thank you for choosing [facility]!!                Signed, Fransico Him, MD  03/05/2016 9:14 AM    [facility] [address], [place], [place]  [address] Phone: [phone]; Fax: [phone]

## 2016-03-11 ENCOUNTER — Encounter: Payer: Self-pay | Admitting: Cardiology

## 2016-03-16 ENCOUNTER — Telehealth (HOSPITAL_COMMUNITY): Payer: Self-pay | Admitting: *Deleted

## 2016-03-16 NOTE — Telephone Encounter (Signed)
Patient given detailed instructions per Myocardial Perfusion Study Information Sheet for the test on 03/19/16 at 0930. Patient notified to arrive 15 minutes early and that it is imperative to arrive on time for appointment to keep from having the test rescheduled.  If you need to cancel or reschedule your appointment, please call the office within 24 hours of your appointment. Failure to do so may result in a cancellation of your appointment, and a $50 no show fee. Patient verbalized understanding.Khianna Blazina, [place]

## 2016-03-19 ENCOUNTER — Ambulatory Visit (HOSPITAL_BASED_OUTPATIENT_CLINIC_OR_DEPARTMENT_OTHER): Payer: Medicare Other

## 2016-03-19 ENCOUNTER — Other Ambulatory Visit: Payer: Self-pay

## 2016-03-19 ENCOUNTER — Ambulatory Visit (HOSPITAL_COMMUNITY): Payer: Medicare Other | Attending: Cardiology

## 2016-03-19 DIAGNOSIS — I350 Nonrheumatic aortic (valve) stenosis: Secondary | ICD-10-CM | POA: Diagnosis not present

## 2016-03-19 DIAGNOSIS — R0609 Other forms of dyspnea: Secondary | ICD-10-CM | POA: Insufficient documentation

## 2016-03-19 DIAGNOSIS — R079 Chest pain, unspecified: Secondary | ICD-10-CM | POA: Insufficient documentation

## 2016-03-19 DIAGNOSIS — I071 Rheumatic tricuspid insufficiency: Secondary | ICD-10-CM | POA: Insufficient documentation

## 2016-03-19 DIAGNOSIS — R9439 Abnormal result of other cardiovascular function study: Secondary | ICD-10-CM | POA: Diagnosis not present

## 2016-03-19 DIAGNOSIS — I340 Nonrheumatic mitral (valve) insufficiency: Secondary | ICD-10-CM | POA: Insufficient documentation

## 2016-03-19 DIAGNOSIS — I352 Nonrheumatic aortic (valve) stenosis with insufficiency: Secondary | ICD-10-CM | POA: Diagnosis not present

## 2016-03-19 DIAGNOSIS — I517 Cardiomegaly: Secondary | ICD-10-CM | POA: Diagnosis not present

## 2016-03-19 LAB — MYOCARDIAL PERFUSION IMAGING
CHL CUP NUCLEAR SSS: 6
CHL CUP RESTING HR STRESS: 61 {beats}/min
CSEPEW: 7 METS
Exercise duration (min): 6 min
Exercise duration (sec): 45 s
LHR: 0.25
LV dias vol: 161 mL (ref 62–150)
LV sys vol: 87 mL
MPHR: 149 {beats}/min
Peak HR: 131 {beats}/min
Percent HR: 87 %
SDS: 0
SRS: 6
TID: 1.23

## 2016-03-19 MED ORDER — TECHNETIUM TC 99M TETROFOSMIN IV KIT
10.3000 | PACK | Freq: Once | INTRAVENOUS | Status: AC | PRN
  Administered 2016-03-19: 10 via INTRAVENOUS
  Filled 2016-03-19: qty 10

## 2016-03-19 MED ORDER — TECHNETIUM TC 99M TETROFOSMIN IV KIT
32.5000 | PACK | Freq: Once | INTRAVENOUS | Status: AC | PRN
  Administered 2016-03-19: 33 via INTRAVENOUS
  Filled 2016-03-19: qty 33

## 2016-03-23 ENCOUNTER — Telehealth: Payer: Self-pay

## 2016-03-23 ENCOUNTER — Encounter: Payer: Self-pay | Admitting: Cardiology

## 2016-03-23 DIAGNOSIS — I350 Nonrheumatic aortic (valve) stenosis: Secondary | ICD-10-CM

## 2016-03-23 NOTE — Telephone Encounter (Signed)
-----   Message from Sueanne Margarita, MD sent at 03/19/2016 11:17 AM EDT ----- Echo showed normal LVF with EF 60% with increased stiffness of heart muscle, moderate AS, mild AI, mild MR.  Since last echo AS has progressed.  Please repeat echo in 6 months

## 2016-03-23 NOTE — Telephone Encounter (Signed)
Informed patient of results and verbal understanding expressed.  Repeat ECHO ordered to be scheduled in 6 months. Patient reports he is not having CP, but he "has not done anything to induce any pain." Patient agrees with treatment plan.

## 2016-03-29 ENCOUNTER — Encounter: Payer: Self-pay | Admitting: Physician Assistant

## 2016-03-29 ENCOUNTER — Ambulatory Visit (INDEPENDENT_AMBULATORY_CARE_PROVIDER_SITE_OTHER): Payer: Medicare Other | Admitting: Physician Assistant

## 2016-03-29 VITALS — BP 130/60 | HR 80 | Ht 67.0 in | Wt 185.0 lb

## 2016-03-29 DIAGNOSIS — I350 Nonrheumatic aortic (valve) stenosis: Secondary | ICD-10-CM

## 2016-03-29 DIAGNOSIS — R079 Chest pain, unspecified: Secondary | ICD-10-CM | POA: Diagnosis not present

## 2016-03-29 LAB — BASIC METABOLIC PANEL
BUN: 17 mg/dL (ref 7–25)
CHLORIDE: 103 mmol/L (ref 98–110)
CO2: 29 mmol/L (ref 20–31)
CREATININE: 0.95 mg/dL (ref 0.70–1.18)
Calcium: 9.1 mg/dL (ref 8.6–10.3)
Glucose, Bld: 70 mg/dL (ref 65–99)
Potassium: 4.9 mmol/L (ref 3.5–5.3)
Sodium: 138 mmol/L (ref 135–146)

## 2016-03-29 LAB — CBC WITH DIFFERENTIAL/PLATELET
BASOS ABS: 0 {cells}/uL (ref 0–200)
Basophils Relative: 0 %
EOS ABS: 240 {cells}/uL (ref 15–500)
EOS PCT: 5 %
HCT: 45.9 % (ref 38.5–50.0)
Hemoglobin: 15.2 g/dL (ref 13.2–17.1)
Lymphs Abs: 1344 cells/uL (ref 850–3900)
MCH: 30.5 pg (ref 27.0–33.0)
MCHC: 33.1 g/dL (ref 32.0–36.0)
MCV: 92 fL (ref 80.0–100.0)
MONOS PCT: 11 %
MPV: 10.6 fL (ref 7.5–12.5)
Monocytes Absolute: 528 cells/uL (ref 200–950)
Neutro Abs: 2688 cells/uL (ref 1500–7800)
Neutrophils Relative %: 56 %
PLATELETS: 196 10*3/uL (ref 140–400)
RBC: 4.99 MIL/uL (ref 4.20–5.80)
RDW: 14.5 % (ref 11.0–15.0)
WBC: 4.8 10*3/uL (ref 3.8–10.8)

## 2016-03-29 LAB — PROTIME-INR
INR: 1
PROTHROMBIN TIME: 10.8 s (ref 9.0–11.5)

## 2016-03-29 NOTE — Patient Instructions (Signed)
Medication Instructions: Your physician recommends that you continue on your current medications as directed. Please refer to the Current Medication list given to you today.   Labwork: TODAY: CBC, BMET, and PT INR - pre procedure labs  Procedures/Testing: Your physician has requested that you have a cardiac catheterization. Cardiac catheterization is used to diagnose and/or treat various heart conditions. Doctors may recommend this procedure for a number of different reasons. The most common reason is to evaluate chest pain. Chest pain can be a symptom of coronary artery disease (CAD), and cardiac catheterization can show whether plaque is narrowing or blocking your heart's arteries. This procedure is also used to evaluate the valves, as well as measure the blood flow and oxygen levels in different parts of your heart. For further information please visit HugeFiesta.tn. Please follow instruction sheet, as given.    Follow-Up: Your physician recommends that you schedule a follow-up appointment in 2 WEEKS with Dr. Radford Pax    Any Additional Special Instructions Will Be Listed Below (If Applicable).     If you need a refill on your cardiac medications before your next appointment, please call your pharmacy.

## 2016-03-29 NOTE — H&P (Signed)
Cardiology Office Note    Date:  03/29/2016   ID:  SAYGE MCDONNELL, DOB 29-Oct-1945, MRN [id number]  PCP:   Melinda Crutch, MD  Cardiologist: Dr. Radford Pax  Chief Complaint  Patient presents with  . Chest Pain    History of Present Illness:  Douglas Lane is a 70 y.o. male with a  history of dyslipidemia and seizure d/o. He has a history of Rheumatic fever as a child.   He was found to have moderate AS by echo.  He recently saw Dr. Radford Pax was complaining of chest tightness early into walking his dog that will go away and he could continue on. He underwent nuclear stress testing 03/19/16 that showed EF of 46% with 2 mm ST depression during stress and a medium defect of moderate severity present the mid inferior, mid inferolateral and apical inferior location consistent with prior MI. This was felt to be an intermediate risk study EF 46%. 2-D echo that same day systolic function was normal EF 60-65% with grade 2 diastolic dysfunction, moderate aortic stenosis with mild AI mean gradient 34 mmHg that has progressed since prior echo which was 31 mmHg. There was also mild MR. Based on these findings Dr. Radford Pax recommended left and right heart catheterization and patient is here today to discuss.  Patient is accompanied by his wife. I spent over 30 minutes discussing the need for right and left heart catheterization. Patient will need a knee replacement in the near future so is agreeable to proceed.   Past Medical History:  Diagnosis Date  . Aortic stenosis    moderate by echo  . Psoriasis   . Rheumatic fever    as a child  . Seizure ([place])   . Sleep apnea   . Thrombosed hemorrhoids     Past Surgical History:  Procedure Laterality Date  . I&D hemorrhoirds    . LUMBAR FUSION  2004  . SHOULDER SURGERY     right    Current Medications: Outpatient Medications Prior to Visit  Medication Sig Dispense Refill  . aspirin 81 MG tablet Take 81 mg by mouth daily.    . clobetasol ointment  (TEMOVATE) [id number] % Apply 1 application topically as needed (skin irritation). Daily  1  . ibuprofen (ADVIL,MOTRIN) 200 MG tablet Take 2-3 tablets by mouth as needed for muscle and body aches    . levETIRAcetam (KEPPRA) 500 MG tablet Take 1 tablet (500 mg total) by mouth 2 (two) times daily. (Patient not taking: Reported on 03/29/2016) 180 tablet 3   No facility-administered medications prior to visit.      Allergies:   Review of patient's allergies indicates no known allergies.   Social History   Social History  . Marital status: Married    Spouse name: N/A  . Number of children: N/A  . Years of education: N/A   Social History Main Topics  . Smoking status: Never Smoker  . Smokeless tobacco: None  . Alcohol use 0.0 oz/week  . Drug use: No  . Sexual activity: Not Asked   Other Topics Concern  . None   Social History Narrative  . None     Family History:  The patient's   family history includes Congestive Heart Failure in his father; Depression in his sister; Renal Disease in his mother.   ROS:   Please see the history of present illness.    Review of Systems  Constitution: Negative.  HENT: Negative.   Cardiovascular: Positive  for chest pain.  Respiratory: Negative.   Endocrine: Negative.   Hematologic/Lymphatic: Negative.   Musculoskeletal: Negative.   Gastrointestinal: Negative.   Genitourinary: Negative.   Neurological: Negative.    All other systems reviewed and are negative.   PHYSICAL EXAM:   VS:  BP 130/60   Pulse 80   Ht 5\' 7"  (1.702 m)   Wt 185 lb (83.9 kg)   BMI 28.98 kg/m   Physical Exam  GEN: Well nourished, well developed, in no acute distress  HEENT: normal  Neck: no JVD, carotid bruits, or masses Cardiac:RRR; 99991111 harsh systolic murmur at the left sternal border no rubs, or gallops  Respiratory:  clear to auscultation bilaterally, normal work of breathing GI: soft, nontender, nondistended, + BS Ext: without cyanosis, clubbing, or edema, Good  distal pulses bilaterally MS: no deformity or atrophy  Skin: warm and dry, no rash Neuro:  Alert and Oriented x 3, Strength and sensation are intact Psych: euthymic mood, full affect  Wt Readings from Last 3 Encounters:  03/29/16 185 lb (83.9 kg)  03/05/16 181 lb (82.1 kg)  09/01/15 185 lb (83.9 kg)      Studies/Labs Reviewed:   EKG:  EKG is  ordered today.  The ekg ordered today demonstrates Normal sinus rhythm, normal EKG  Recent Labs: No results found for requested labs within last 8760 hours.   Lipid Panel No results found for: CHOL, TRIG, HDL, CHOLHDL, VLDL, LDLCALC, LDLDIRECT  Additional studies/ records that were reviewed today include:   Nuclear stress EF: 46%.  Blood pressure demonstrated a normal response to exercise.  Downsloping ST segment depression ST segment depression of 2 mm was noted during stress in the II, III, aVF, V6, V5 and V4 leads.  Defect 1: There is a medium defect of moderate severity present in the mid inferior, mid inferolateral and apical inferior location.  Findings consistent with prior myocardial infarction.  This is an intermediate risk study.   Moderate size and intensity fixed inferior defect suggestive of scar. No reversible ischemia. LVEF 46% with inferior hypokinesis. This is an intermediate risk study.   Study Conclusions   - Left ventricle: The cavity size was normal. There was severe   focal basal hypertrophy of the septum. Systolic function was   normal. The estimated ejection fraction was in the range of 60%   to 65%. Wall motion was normal; there were no regional wall   motion abnormalities. Features are consistent with a pseudonormal   left ventricular filling pattern, with concomitant abnormal   relaxation and increased filling pressure (grade 2 diastolic   dysfunction). - Aortic valve: Valve mobility was restricted. There was moderate   stenosis. There was mild regurgitation. Peak velocity (S): 380   cm/s. Mean  gradient (S): 34 mm Hg. This has progressed since   prior echocardiogram (54mmHg). - Mitral valve: There was mild regurgitation. - Left atrium: The atrium was mildly dilated. Anterior-posterior   dimension: 44 mm. - Tricuspid valve: There was trivial regurgitation.       ASSESSMENT:    1. Chest pain, unspecified chest pain type   2. Aortic stenosis      PLAN:  In order of problems listed above:  Chest pain with intermediate risk nuclear stress test. Her Turner recommends right and left heart catheterization to rule out ischemia. 30 minutes of face time spent with patient and his wife discussing the results of the above tests and cardiac catheterization. Patient is agreeable to proceed. Will draw labs today.  I have reviewed the risks, indications, and alternatives to angioplasty and stenting with the patient. Risks include but are not limited to bleeding, infection, vascular injury, stroke, myocardial infection, arrhythmia, kidney injury, radiation-related injury in the case of prolonged fluoroscopy use, emergency cardiac surgery, and death. The patient understands the risks of serious complication is low (123456) and he agrees to proceed.   Aortic stenosis moderate which has progressed since last echo. To have right left heart catheterization to further evaluate.    Medication Adjustments/Labs and Tests Ordered: Current medicines are reviewed at length with the patient today.  Concerns regarding medicines are outlined above.  Medication changes, Labs and Tests ordered today are listed in the Patient Instructions below. Patient Instructions  Medication Instructions: Your physician recommends that you continue on your current medications as directed. Please refer to the Current Medication list given to you today.   Labwork: TODAY: CBC, BMET, and PT INR - pre procedure labs  Procedures/Testing: Your physician has requested that you have a cardiac catheterization. Cardiac  catheterization is used to diagnose and/or treat various heart conditions. Doctors may recommend this procedure for a number of different reasons. The most common reason is to evaluate chest pain. Chest pain can be a symptom of coronary artery disease (CAD), and cardiac catheterization can show whether plaque is narrowing or blocking your heart's arteries. This procedure is also used to evaluate the valves, as well as measure the blood flow and oxygen levels in different parts of your heart. For further information please visit HugeFiesta.[email or web address]. Please follow instruction sheet, as given.    Follow-Up: Your physician recommends that you schedule a follow-up appointment in 2 WEEKS with Dr. Radford Pax    Any Additional Special Instructions Will Be Listed Below (If Applicable).     If you need a refill on your cardiac medications before your next appointment, please call your pharmacy.      Sumner Boast, PA-C  03/29/2016 1:15 PM    [facility] [place], [place], [place]  [address] Phone: [phone]; Fax: [phone]

## 2016-03-29 NOTE — Progress Notes (Signed)
Cardiology Office Note    Date:  03/29/2016   ID:  Douglas Lane, DOB 1946/05/10, MRN [id number]  PCP:   Melinda Crutch, MD  Cardiologist: Dr. Radford Pax  Chief Complaint  Patient presents with  . Chest Pain    History of Present Illness:  Douglas Lane is a 70 y.o. male with a  history of dyslipidemia and seizure d/o. He has a history of Rheumatic fever as a child.   He was found to have moderate AS by echo.  He recently saw Dr. Radford Pax was complaining of chest tightness early into walking his dog that will go away and he could continue on. He underwent nuclear stress testing 03/19/16 that showed EF of 46% with 2 mm ST depression during stress and a medium defect of moderate severity present the mid inferior, mid inferolateral and apical inferior location consistent with prior MI. This was felt to be an intermediate risk study EF 46%. 2-D echo that same day systolic function was normal EF 60-65% with grade 2 diastolic dysfunction, moderate aortic stenosis with mild AI mean gradient 34 mmHg that has progressed since prior echo which was 31 mmHg. There was also mild MR. Based on these findings Dr. Radford Pax recommended left and right heart catheterization and patient is here today to discuss.  Patient is accompanied by his wife. I spent over 30 minutes discussing the need for right and left heart catheterization. Patient will need a knee replacement in the near future so is agreeable to proceed.   Past Medical History:  Diagnosis Date  . Aortic stenosis    moderate by echo  . Psoriasis   . Rheumatic fever    as a child  . Seizure ([place])   . Sleep apnea   . Thrombosed hemorrhoids     Past Surgical History:  Procedure Laterality Date  . I&D hemorrhoirds    . LUMBAR FUSION  2004  . SHOULDER SURGERY     right    Current Medications: Outpatient Medications Prior to Visit  Medication Sig Dispense Refill  . aspirin 81 MG tablet Take 81 mg by mouth daily.    . clobetasol ointment  (TEMOVATE) [id number] % Apply 1 application topically as needed (skin irritation). Daily  1  . ibuprofen (ADVIL,MOTRIN) 200 MG tablet Take 2-3 tablets by mouth as needed for muscle and body aches    . levETIRAcetam (KEPPRA) 500 MG tablet Take 1 tablet (500 mg total) by mouth 2 (two) times daily. (Patient not taking: Reported on 03/29/2016) 180 tablet 3   No facility-administered medications prior to visit.      Allergies:   Review of patient's allergies indicates no known allergies.   Social History   Social History  . Marital status: Married    Spouse name: N/A  . Number of children: N/A  . Years of education: N/A   Social History Main Topics  . Smoking status: Never Smoker  . Smokeless tobacco: None  . Alcohol use 0.0 oz/week  . Drug use: No  . Sexual activity: Not Asked   Other Topics Concern  . None   Social History Narrative  . None     Family History:  The patient's   family history includes Congestive Heart Failure in his father; Depression in his sister; Renal Disease in his mother.   ROS:   Please see the history of present illness.    Review of Systems  Constitution: Negative.  HENT: Negative.   Cardiovascular: Positive for chest pain.  Respiratory: Negative.   Endocrine: Negative.   Hematologic/Lymphatic: Negative.   Musculoskeletal: Negative.   Gastrointestinal: Negative.   Genitourinary: Negative.   Neurological: Negative.    All other systems reviewed and are negative.   PHYSICAL EXAM:   VS:  BP 130/60   Pulse 80   Ht 5\' 7"  (1.702 m)   Wt 185 lb (83.9 kg)   BMI 28.98 kg/m   Physical Exam  GEN: Well nourished, well developed, in no acute distress  HEENT: normal  Neck: no JVD, carotid bruits, or masses Cardiac:RRR; 99991111 harsh systolic murmur at the left sternal border no rubs, or gallops  Respiratory:  clear to auscultation bilaterally, normal work of breathing GI: soft, nontender, nondistended, + BS Ext: without cyanosis, clubbing, or edema, Good  distal pulses bilaterally MS: no deformity or atrophy  Skin: warm and dry, no rash Neuro:  Alert and Oriented x 3, Strength and sensation are intact Psych: euthymic mood, full affect  Wt Readings from Last 3 Encounters:  03/29/16 185 lb (83.9 kg)  03/05/16 181 lb (82.1 kg)  09/01/15 185 lb (83.9 kg)      Studies/Labs Reviewed:   EKG:  EKG is  ordered today.  The ekg ordered today demonstrates Normal sinus rhythm, normal EKG  Recent Labs: No results found for requested labs within last 8760 hours.   Lipid Panel No results found for: CHOL, TRIG, HDL, CHOLHDL, VLDL, LDLCALC, LDLDIRECT  Additional studies/ records that were reviewed today include:   Nuclear stress EF: 46%.  Blood pressure demonstrated a normal response to exercise.  Downsloping ST segment depression ST segment depression of 2 mm was noted during stress in the II, III, aVF, V6, V5 and V4 leads.  Defect 1: There is a medium defect of moderate severity present in the mid inferior, mid inferolateral and apical inferior location.  Findings consistent with prior myocardial infarction.  This is an intermediate risk study.   Moderate size and intensity fixed inferior defect suggestive of scar. No reversible ischemia. LVEF 46% with inferior hypokinesis. This is an intermediate risk study.   Study Conclusions   - Left ventricle: The cavity size was normal. There was severe   focal basal hypertrophy of the septum. Systolic function was   normal. The estimated ejection fraction was in the range of 60%   to 65%. Wall motion was normal; there were no regional wall   motion abnormalities. Features are consistent with a pseudonormal   left ventricular filling pattern, with concomitant abnormal   relaxation and increased filling pressure (grade 2 diastolic   dysfunction). - Aortic valve: Valve mobility was restricted. There was moderate   stenosis. There was mild regurgitation. Peak velocity (S): 380   cm/s. Mean  gradient (S): 34 mm Hg. This has progressed since   prior echocardiogram (44mmHg). - Mitral valve: There was mild regurgitation. - Left atrium: The atrium was mildly dilated. Anterior-posterior   dimension: 44 mm. - Tricuspid valve: There was trivial regurgitation.       ASSESSMENT:    1. Chest pain, unspecified chest pain type   2. Aortic stenosis      PLAN:  In order of problems listed above:  Chest pain with intermediate risk nuclear stress test. Her Turner recommends right and left heart catheterization to rule out ischemia. 30 minutes of face time spent with patient and his wife discussing the results of the above tests and cardiac catheterization. Patient is agreeable to proceed. Will draw labs today. I have reviewed the  risks, indications, and alternatives to angioplasty and stenting with the patient. Risks include but are not limited to bleeding, infection, vascular injury, stroke, myocardial infection, arrhythmia, kidney injury, radiation-related injury in the case of prolonged fluoroscopy use, emergency cardiac surgery, and death. The patient understands the risks of serious complication is low (123456) and he agrees to proceed.   Aortic stenosis moderate which has progressed since last echo. To have right left heart catheterization to further evaluate.    Medication Adjustments/Labs and Tests Ordered: Current medicines are reviewed at length with the patient today.  Concerns regarding medicines are outlined above.  Medication changes, Labs and Tests ordered today are listed in the Patient Instructions below. Patient Instructions  Medication Instructions: Your physician recommends that you continue on your current medications as directed. Please refer to the Current Medication list given to you today.   Labwork: TODAY: CBC, BMET, and PT INR - pre procedure labs  Procedures/Testing: Your physician has requested that you have a cardiac catheterization. Cardiac  catheterization is used to diagnose and/or treat various heart conditions. Doctors may recommend this procedure for a number of different reasons. The most common reason is to evaluate chest pain. Chest pain can be a symptom of coronary artery disease (CAD), and cardiac catheterization can show whether plaque is narrowing or blocking your heart's arteries. This procedure is also used to evaluate the valves, as well as measure the blood flow and oxygen levels in different parts of your heart. For further information please visit HugeFiesta.[email or web address]. Please follow instruction sheet, as given.    Follow-Up: Your physician recommends that you schedule a follow-up appointment in 2 WEEKS with Dr. Radford Pax    Any Additional Special Instructions Will Be Listed Below (If Applicable).     If you need a refill on your cardiac medications before your next appointment, please call your pharmacy.      Sumner Boast, PA-C  03/29/2016 1:15 PM    [facility] [place], [place], [place]  [address] Phone: [phone]; Fax: [phone]

## 2016-03-30 ENCOUNTER — Encounter (HOSPITAL_COMMUNITY)
Admission: RE | Disposition: A | Discharge status: Home / Self Care | Payer: Self-pay | Source: Ambulatory Visit | Attending: Interventional Cardiology

## 2016-03-30 ENCOUNTER — Other Ambulatory Visit: Payer: Self-pay

## 2016-03-30 ENCOUNTER — Telehealth: Payer: Self-pay

## 2016-03-30 ENCOUNTER — Encounter (HOSPITAL_COMMUNITY): Payer: Self-pay | Admitting: *Deleted

## 2016-03-30 ENCOUNTER — Ambulatory Visit (HOSPITAL_COMMUNITY)
Admission: RE | Admit: 2016-03-30 | Discharge: 2016-03-31 | Disposition: A | Discharge status: Home / Self Care | Payer: Medicare Other | Source: Ambulatory Visit | Attending: Interventional Cardiology | Admitting: Interventional Cardiology

## 2016-03-30 DIAGNOSIS — E785 Hyperlipidemia, unspecified: Secondary | ICD-10-CM | POA: Insufficient documentation

## 2016-03-30 DIAGNOSIS — I2510 Atherosclerotic heart disease of native coronary artery without angina pectoris: Secondary | ICD-10-CM | POA: Diagnosis not present

## 2016-03-30 DIAGNOSIS — R9439 Abnormal result of other cardiovascular function study: Secondary | ICD-10-CM

## 2016-03-30 DIAGNOSIS — I25110 Atherosclerotic heart disease of native coronary artery with unstable angina pectoris: Secondary | ICD-10-CM | POA: Diagnosis not present

## 2016-03-30 DIAGNOSIS — K649 Unspecified hemorrhoids: Secondary | ICD-10-CM | POA: Diagnosis not present

## 2016-03-30 DIAGNOSIS — M1990 Unspecified osteoarthritis, unspecified site: Secondary | ICD-10-CM | POA: Diagnosis not present

## 2016-03-30 DIAGNOSIS — G40909 Epilepsy, unspecified, not intractable, without status epilepticus: Secondary | ICD-10-CM | POA: Diagnosis not present

## 2016-03-30 DIAGNOSIS — I350 Nonrheumatic aortic (valve) stenosis: Secondary | ICD-10-CM | POA: Diagnosis not present

## 2016-03-30 DIAGNOSIS — Z7982 Long term (current) use of aspirin: Secondary | ICD-10-CM | POA: Insufficient documentation

## 2016-03-30 DIAGNOSIS — L409 Psoriasis, unspecified: Secondary | ICD-10-CM | POA: Diagnosis not present

## 2016-03-30 DIAGNOSIS — R079 Chest pain, unspecified: Secondary | ICD-10-CM

## 2016-03-30 DIAGNOSIS — Z981 Arthrodesis status: Secondary | ICD-10-CM | POA: Insufficient documentation

## 2016-03-30 DIAGNOSIS — I060 Rheumatic aortic stenosis: Secondary | ICD-10-CM | POA: Diagnosis not present

## 2016-03-30 DIAGNOSIS — I5031 Acute diastolic (congestive) heart failure: Secondary | ICD-10-CM | POA: Diagnosis not present

## 2016-03-30 DIAGNOSIS — I517 Cardiomegaly: Secondary | ICD-10-CM | POA: Diagnosis present

## 2016-03-30 DIAGNOSIS — Z9861 Coronary angioplasty status: Secondary | ICD-10-CM

## 2016-03-30 DIAGNOSIS — R001 Bradycardia, unspecified: Secondary | ICD-10-CM | POA: Insufficient documentation

## 2016-03-30 DIAGNOSIS — I340 Nonrheumatic mitral (valve) insufficiency: Secondary | ICD-10-CM | POA: Diagnosis not present

## 2016-03-30 DIAGNOSIS — G4730 Sleep apnea, unspecified: Secondary | ICD-10-CM | POA: Insufficient documentation

## 2016-03-30 DIAGNOSIS — I200 Unstable angina: Secondary | ICD-10-CM

## 2016-03-30 DIAGNOSIS — Z8249 Family history of ischemic heart disease and other diseases of the circulatory system: Secondary | ICD-10-CM | POA: Diagnosis not present

## 2016-03-30 HISTORY — DX: Hyperlipidemia, unspecified: E78.5

## 2016-03-30 HISTORY — DX: Coronary angioplasty status: Z98.61

## 2016-03-30 HISTORY — DX: Other chronic pain: G89.29

## 2016-03-30 HISTORY — DX: Cardiac murmur, unspecified: R01.1

## 2016-03-30 HISTORY — DX: Squamous cell carcinoma of skin of other parts of face: C44.329

## 2016-03-30 HISTORY — DX: Unspecified osteoarthritis, unspecified site: M19.90

## 2016-03-30 HISTORY — DX: Bradycardia, unspecified: R00.1

## 2016-03-30 HISTORY — DX: Low back pain: M54.5

## 2016-03-30 HISTORY — PX: CARDIAC CATHETERIZATION: SHX172

## 2016-03-30 HISTORY — DX: Low back pain, unspecified: M54.50

## 2016-03-30 HISTORY — DX: Atherosclerotic heart disease of native coronary artery without angina pectoris: I25.10

## 2016-03-30 HISTORY — DX: Nonrheumatic mitral (valve) insufficiency: I34.0

## 2016-03-30 HISTORY — DX: Cardiomegaly: I51.7

## 2016-03-30 HISTORY — DX: Chronic diastolic (congestive) heart failure: I50.32

## 2016-03-30 LAB — POCT I-STAT 3, ART BLOOD GAS (G3+)
Acid-Base Excess: 2 mmol/L (ref 0.0–2.0)
Bicarbonate: 27.2 mEq/L — ABNORMAL HIGH (ref 20.0–24.0)
O2 SAT: 91 %
TCO2: 29 mmol/L (ref 0–100)
pCO2 arterial: 44.1 mmHg (ref 35.0–45.0)
pH, Arterial: 7.398 (ref 7.350–7.450)
pO2, Arterial: 63 mmHg — ABNORMAL LOW (ref 80.0–100.0)

## 2016-03-30 LAB — POCT I-STAT 3, VENOUS BLOOD GAS (G3P V)
ACID-BASE DEFICIT: 1 mmol/L (ref 0.0–2.0)
BICARBONATE: 25.1 meq/L — AB (ref 20.0–24.0)
O2 SAT: 67 %
PO2 VEN: 38 mmHg (ref 31.0–45.0)
TCO2: 27 mmol/L (ref 0–100)
pCO2, Ven: 48.5 mmHg (ref 45.0–50.0)
pH, Ven: 7.322 — ABNORMAL HIGH (ref 7.250–7.300)

## 2016-03-30 LAB — PLATELET COUNT: Platelets: 164 10*3/uL (ref 150–400)

## 2016-03-30 LAB — POCT ACTIVATED CLOTTING TIME: ACTIVATED CLOTTING TIME: 318 s

## 2016-03-30 SURGERY — RIGHT/LEFT HEART CATH AND CORONARY ANGIOGRAPHY
Anesthesia: LOCAL

## 2016-03-30 MED ORDER — HEPARIN SODIUM (PORCINE) 1000 UNIT/ML IJ SOLN
INTRAMUSCULAR | Status: DC | PRN
  Administered 2016-03-30: 4000 [IU] via INTRAVENOUS
  Administered 2016-03-30: 5000 [IU] via INTRAVENOUS

## 2016-03-30 MED ORDER — SODIUM CHLORIDE 0.9 % IV SOLN: 250.0000 mL | INTRAVENOUS | Status: DC | PRN

## 2016-03-30 MED ORDER — ASPIRIN 81 MG PO CHEW
81.0000 mg | CHEWABLE_TABLET | Freq: Every day | ORAL | Status: DC
  Administered 2016-03-31: 81 mg via ORAL
  Filled 2016-03-30: qty 1

## 2016-03-30 MED ORDER — SODIUM CHLORIDE 0.9% FLUSH: 3.0000 mL | INTRAVENOUS | Status: DC | PRN

## 2016-03-30 MED ORDER — IOPAMIDOL (ISOVUE-370) INJECTION 76%
INTRAVENOUS | Status: DC | PRN
  Administered 2016-03-30: 100 mL via INTRA_ARTERIAL

## 2016-03-30 MED ORDER — IOPAMIDOL (ISOVUE-370) INJECTION 76%
INTRAVENOUS | Status: AC
  Filled 2016-03-30: qty 100

## 2016-03-30 MED ORDER — ONDANSETRON HCL 4 MG/2ML IJ SOLN: 4.0000 mg | Freq: Four times a day (QID) | INTRAMUSCULAR | Status: DC | PRN

## 2016-03-30 MED ORDER — MIDAZOLAM HCL 2 MG/2ML IJ SOLN
INTRAMUSCULAR | Status: AC
  Filled 2016-03-30: qty 2

## 2016-03-30 MED ORDER — VERAPAMIL HCL 2.5 MG/ML IV SOLN
INTRAVENOUS | Status: AC
  Filled 2016-03-30: qty 2

## 2016-03-30 MED ORDER — HEPARIN (PORCINE) IN NACL 2-0.9 UNIT/ML-% IJ SOLN
INTRAMUSCULAR | Status: DC | PRN
  Administered 2016-03-30: 1500 mL

## 2016-03-30 MED ORDER — LEVETIRACETAM 750 MG PO TABS
750.0000 mg | ORAL_TABLET | Freq: Two times a day (BID) | ORAL | Status: DC
  Administered 2016-03-30 – 2016-03-31 (×2): 750 mg via ORAL
  Filled 2016-03-30 (×3): qty 1

## 2016-03-30 MED ORDER — ASPIRIN 81 MG PO TABS: 81.0000 mg | ORAL_TABLET | Freq: Every day | ORAL | Status: DC

## 2016-03-30 MED ORDER — VERAPAMIL HCL 2.5 MG/ML IV SOLN
INTRAVENOUS | Status: DC | PRN
  Administered 2016-03-30: 10 mL via INTRA_ARTERIAL

## 2016-03-30 MED ORDER — SODIUM CHLORIDE 0.9 % IV SOLN: INTRAVENOUS | Status: AC

## 2016-03-30 MED ORDER — TIROFIBAN (AGGRASTAT) BOLUS VIA INFUSION
INTRAVENOUS | Status: DC | PRN
  Administered 2016-03-30: 2040 ug via INTRAVENOUS

## 2016-03-30 MED ORDER — ANGIOPLASTY BOOK
Freq: Once | Status: AC
  Administered 2016-03-30: 21:00:00
  Filled 2016-03-30: qty 1

## 2016-03-30 MED ORDER — SODIUM CHLORIDE 0.9% FLUSH
3.0000 mL | Freq: Two times a day (BID) | INTRAVENOUS | Status: DC
  Administered 2016-03-30: 3 mL via INTRAVENOUS

## 2016-03-30 MED ORDER — HEPARIN (PORCINE) IN NACL 2-0.9 UNIT/ML-% IJ SOLN
INTRAMUSCULAR | Status: AC
  Filled 2016-03-30: qty 1500

## 2016-03-30 MED ORDER — HEPARIN SODIUM (PORCINE) 1000 UNIT/ML IJ SOLN
INTRAMUSCULAR | Status: AC
  Filled 2016-03-30: qty 1

## 2016-03-30 MED ORDER — SODIUM CHLORIDE 0.9% FLUSH: 3.0000 mL | Freq: Two times a day (BID) | INTRAVENOUS | Status: DC

## 2016-03-30 MED ORDER — TICAGRELOR 90 MG PO TABS
ORAL_TABLET | ORAL | Status: AC
  Filled 2016-03-30: qty 2

## 2016-03-30 MED ORDER — TICAGRELOR 90 MG PO TABS
ORAL_TABLET | ORAL | Status: DC | PRN
  Administered 2016-03-30: 180 mg via ORAL

## 2016-03-30 MED ORDER — TIROFIBAN HCL IN NACL 5-0.9 MG/100ML-% IV SOLN
INTRAVENOUS | Status: AC
  Filled 2016-03-30: qty 100

## 2016-03-30 MED ORDER — SODIUM CHLORIDE 0.9 % WEIGHT BASED INFUSION: 1.0000 mL/kg/h | INTRAVENOUS | Status: DC

## 2016-03-30 MED ORDER — IOPAMIDOL (ISOVUE-370) INJECTION 76%
INTRAVENOUS | Status: AC
  Filled 2016-03-30: qty 50

## 2016-03-30 MED ORDER — FENTANYL CITRATE (PF) 100 MCG/2ML IJ SOLN
INTRAMUSCULAR | Status: AC
  Filled 2016-03-30: qty 2

## 2016-03-30 MED ORDER — ACETAMINOPHEN 325 MG PO TABS: 650.0000 mg | ORAL_TABLET | ORAL | Status: DC | PRN

## 2016-03-30 MED ORDER — TIROFIBAN HCL IN NACL 5-0.9 MG/100ML-% IV SOLN
INTRAVENOUS | Status: DC | PRN
  Administered 2016-03-30: 0.15 ug/kg/min via INTRAVENOUS

## 2016-03-30 MED ORDER — SODIUM CHLORIDE 0.9 % WEIGHT BASED INFUSION
3.0000 mL/kg/h | INTRAVENOUS | Status: DC
  Administered 2016-03-30: 3 mL/kg/h via INTRAVENOUS

## 2016-03-30 MED ORDER — TIROFIBAN HCL IN NACL 5-0.9 MG/100ML-% IV SOLN
0.1500 ug/kg/min | INTRAVENOUS | Status: AC
  Filled 2016-03-30: qty 100

## 2016-03-30 MED ORDER — LIDOCAINE HCL (PF) 1 % IJ SOLN
INTRAMUSCULAR | Status: DC | PRN
  Administered 2016-03-30: 25 mL via INTRADERMAL

## 2016-03-30 MED ORDER — LIDOCAINE HCL (PF) 1 % IJ SOLN
INTRAMUSCULAR | Status: AC
  Filled 2016-03-30: qty 30

## 2016-03-30 MED ORDER — MIDAZOLAM HCL 2 MG/2ML IJ SOLN
INTRAMUSCULAR | Status: DC | PRN
  Administered 2016-03-30 (×2): 1 mg via INTRAVENOUS
  Administered 2016-03-30: 2 mg via INTRAVENOUS

## 2016-03-30 MED ORDER — FENTANYL CITRATE (PF) 100 MCG/2ML IJ SOLN
INTRAMUSCULAR | Status: DC | PRN
  Administered 2016-03-30 (×3): 25 ug via INTRAVENOUS

## 2016-03-30 MED ORDER — ASPIRIN 81 MG PO CHEW: 81.0000 mg | CHEWABLE_TABLET | ORAL | Status: DC

## 2016-03-30 MED ORDER — TICAGRELOR 90 MG PO TABS
90.0000 mg | ORAL_TABLET | Freq: Two times a day (BID) | ORAL | Status: DC
  Administered 2016-03-30 – 2016-03-31 (×2): 90 mg via ORAL
  Filled 2016-03-30 (×2): qty 1

## 2016-03-30 SURGICAL SUPPLY — 26 items
BALLN EMERGE MR 2.25X20 (BALLOONS) ×2
BALLN ~~LOC~~ EMERGE MR 3.5X20 (BALLOONS) ×2
BALLOON EMERGE MR 2.25X20 (BALLOONS) ×1 IMPLANT
BALLOON ~~LOC~~ EMERGE MR 3.5X20 (BALLOONS) ×1 IMPLANT
CATH INFINITI 4FR 145 PIGTAIL (CATHETERS) ×2 IMPLANT
CATH INFINITI 5 FR JL3.5 (CATHETERS) ×2 IMPLANT
CATH INFINITI JR4 5F (CATHETERS) ×2 IMPLANT
CATH SWAN GANZ 7F STRAIGHT (CATHETERS) ×2 IMPLANT
DEVICE RAD COMP TR BAND LRG (VASCULAR PRODUCTS) ×4 IMPLANT
GLIDESHEATH SLEND SS 6F .021 (SHEATH) ×2 IMPLANT
GUIDE CATH RUNWAY 6FR FR4 (CATHETERS) ×2 IMPLANT
KIT ENCORE 26 ADVANTAGE (KITS) ×2 IMPLANT
KIT HEART LEFT (KITS) ×2 IMPLANT
PACK CARDIAC CATHETERIZATION (CUSTOM PROCEDURE TRAY) ×2 IMPLANT
SHEATH PINNACLE 7F 10CM (SHEATH) ×2 IMPLANT
STENT SYNERGY DES 3X38 (Permanent Stent) ×2 IMPLANT
SYR MEDRAD MARK V 150ML (SYRINGE) IMPLANT
TRANSDUCER W/STOPCOCK (MISCELLANEOUS) ×4 IMPLANT
TUBING ART PRESS 72  MALE/FEM (TUBING) ×1
TUBING ART PRESS 72 MALE/FEM (TUBING) ×1 IMPLANT
TUBING CIL FLEX 10 FLL-RA (TUBING) ×2 IMPLANT
VALVE GUARDIAN II ~~LOC~~ HEMO (MISCELLANEOUS) ×2 IMPLANT
WIRE ASAHI PROWATER 180CM (WIRE) ×2 IMPLANT
WIRE EMERALD 3MM-J .025X260CM (WIRE) ×2 IMPLANT
WIRE HI TORQ VERSACORE-J 145CM (WIRE) ×2 IMPLANT
WIRE SAFE-T 1.5MM-J .035X260CM (WIRE) ×2 IMPLANT

## 2016-03-30 NOTE — Telephone Encounter (Signed)
Left message on wife's cell phone to confirm and make pt aware of follow up appointment scheduled with Dr. Radford Pax post cath 8/15. Per Sammuel Bailiff appt I scheduled for 04/23/16 @ 1:45 pm.

## 2016-03-30 NOTE — H&P (View-Only) (Signed)
Cardiology Office Note    Date:  03/05/2016   ID:  ERIN KELLIE, DOB Jul 18, 1946, MRN [id number]  PCP:   Melinda Crutch, MD  Cardiologist:  Fransico Him, MD   Chief Complaint  Patient presents with  . Aortic Stenosis    History of Present Illness:  Douglas Lane is a 70 y.o. male who presents for followup of of heart murmur. He has a history of dyslipidemia and seizure d/o. He  has a history of Rheumatic fever as a child. He was found to have moderate AS by echo.He is doing well today.   He denies any  LE edema, dizziness, palpitations or syncope. He does have some problems with DOE and tightness when walking the dog at first for about a 1/4 mile but then resolves and he continues to walk. This has been stable for years.     Past Medical History  Diagnosis Date  . Sleep apnea   . Seizure ([place])   . Psoriasis   . Thrombosed hemorrhoids   . Rheumatic fever     as a child  . Aortic stenosis     moderate by echo    Past Surgical History  Procedure Laterality Date  . Lumbar fusion  2004  . I&d hemorrhoirds    . Shoulder surgery      right    Current Medications: Outpatient Prescriptions Prior to Visit  Medication Sig Dispense Refill  . aspirin 81 MG tablet Take 81 mg by mouth daily.    . clobetasol ointment (TEMOVATE) 0.05 % Apply topically as needed. Daily  1  . ibuprofen (ADVIL,MOTRIN) 200 MG tablet Take 200 mg by mouth. 3 tablets as needed    . levETIRAcetam (KEPPRA) 500 MG tablet Take 1 tablet (500 mg total) by mouth 2 (two) times daily. (Patient taking differently: Take 750 mg by mouth 2 (two) times daily. ) 180 tablet 3   No facility-administered medications prior to visit.     Allergies:   Review of patient's allergies indicates no known allergies.   Social History   Social History  . Marital Status: Married    Spouse Name: N/A  . Number of Children: N/A  . Years of Education: N/A   Social History Main Topics  . Smoking status: Never Smoker   .  Smokeless tobacco: None  . Alcohol Use: 0.0 oz/week    0 Standard drinks or equivalent per week  . Drug Use: No  . Sexual Activity: Not Asked   Other Topics Concern  . None   Social History Narrative     Family History:  The patient's family history includes Congestive Heart Failure in his father; Depression in his sister; Renal Disease in his mother.   ROS:   Please see the history of present illness.    ROS All other systems reviewed and are negative.   PHYSICAL EXAM:   VS:  BP 144/82 mmHg  Pulse 62  Ht 5\' 6"  (1.676 m)  Wt 181 lb (82.101 kg)  BMI 29.23 kg/m2   GEN: Well nourished, well developed, in no acute distress HEENT: normal Neck: no JVD, carotid bruits, or masses Cardiac: RRR; no rubs, or gallops,no edema.  Intact distal pulses bilaterally. 2/6 SM at RUSB to LLSB Respiratory:  clear to auscultation bilaterally, normal work of breathing GI: soft, nontender, nondistended, + BS MS: no deformity or atrophy Skin: warm and dry, no rash Neuro:  Alert and Oriented x 3, Strength and sensation are intact Psych: euthymic  mood, full affect  Wt Readings from Last 3 Encounters:  03/05/16 181 lb (82.101 kg)  09/01/15 185 lb (83.915 kg)  03/06/15 181 lb (82.101 kg)      Studies/Labs Reviewed:   EKG:  EKG is ordered today.  The ekg ordered today demonstrates NSR at 62bpm with LVH with repolarization abnormality  Recent Labs: No results found for requested labs within last 365 days.   Lipid Panel No results found for: CHOL, TRIG, HDL, CHOLHDL, VLDL, LDLCALC, LDLDIRECT  Additional studies/ records that were reviewed today include:  none    ASSESSMENT:    1. Aortic stenosis   2. Chest pain, unspecified chest pain type   3. LVH (left ventricular hypertrophy)   4. Rheumatic fever      PLAN:  In order of problems listed above:  1. Moderate aortic stenosis by echo a year ago.  He is completely asymptomatic. Still appears moderate by exam.  I will recheck echo  for progression.  I have reviewed the symptoms that could indicate progression of his AS including CP, increased SOB, LE edema, dizziness or syncope.  He will let me know if this occurs, otherwise I will see him in 1 year. 2. Chest tightness with exertion.  He does have new ST changes in the lateral precordial leads but this could be explained by LVH with repol abnormality.  Given his chest tightness I will get a stress myoview to rule out ischemia.   3. LVH with repol abnormality 4. History of rheumatic fever    Medication Adjustments/Labs and Tests Ordered: Current medicines are reviewed at length with the patient today.  Concerns regarding medicines are outlined above.  Medication changes, Labs and Tests ordered today are listed in the Patient Instructions below.  Patient Instructions  Medication Instructions:  Your physician recommends that you continue on your current medications as directed. Please refer to the Current Medication list given to you today.  --- If you need a refill on your cardiac medications before your next appointment, please call your pharmacy. ---  Labwork: None ordered  Testing/Procedures: None ordered  Follow-Up: Your physician wants you to follow-up in: 1 year with Dr. Radford Pax.  You will receive a reminder letter in the mail two months in advance. If you don't receive a letter, please call our office to schedule the follow-up appointment.   Any Other Special Instructions Will Be Listed Below (If Applicable).  Thank you for choosing [facility]!!                Signed, Fransico Him, MD  03/05/2016 9:14 AM    [facility] [place], [place], [place]  [address] Phone: [phone]; Fax: [phone]

## 2016-03-30 NOTE — Progress Notes (Signed)
Right Radial TR Bands X 2 removed and clean dry drsg applied.  Site has soft bruising prox to the site. Pt arrived with a developing hematoma that was mashed out with pressure held by cath lab.  Palpable 2+ radial and ulnar pulses. Pt tolerated fairly today.

## 2016-03-30 NOTE — Progress Notes (Signed)
On arrival to floor hematoma began forming proximal to TR band, quarter sized. Pressure held and hematoma expressed. Placed second TR band over area and inflated to 13 cc. Informed assisting nurse on floor.

## 2016-03-30 NOTE — Care Management Note (Addendum)
Case Management Note  Patient Details  Name: Douglas Lane MRN: [id number] Date of Birth: [birth year]  Subjective/Objective:  Patient is s/p coronary stent intervention, may be candidate for Twilight study.  PTA indep,   NCM will cont to follow for dc needs.                   Action/Plan:   Expected Discharge Date:                  Expected Discharge Plan:  Home/Self Care  In-House Referral:     Discharge planning Services  CM Consult  Post Acute Care Choice:    Choice offered to:     DME Arranged:    DME Agency:     HH Arranged:    HH Agency:     Status of Service:  In process, will continue to follow  If discussed at Long Length of Stay Meetings, dates discussed:    Additional Comments:  Zenon Mayo, RN 03/30/2016, 12:22 PM

## 2016-03-30 NOTE — Interval H&P Note (Signed)
Cath Lab Visit (complete for each Cath Lab visit)  Clinical Evaluation Leading to the Procedure:   ACS: No.  Non-ACS:    Anginal Classification: CCS II  Anti-ischemic medical therapy: Minimal Therapy (1 class of medications)  Non-Invasive Test Results: Intermediate-risk stress test findings: cardiac mortality 1-3%/year  Prior CABG: No previous CABG      History and Physical Interval Note:  03/30/2016 8:30 AM  Douglas Lane  has presented today for surgery, with the diagnosis of moderate aortic stenosis, cp  The various methods of treatment have been discussed with the patient and family. After consideration of risks, benefits and other options for treatment, the patient has consented to  Procedure(s): Right/Left Heart Cath and Coronary Angiography (N/A) as a surgical intervention .  The patient's history has been reviewed, patient examined, no change in status, stable for surgery.  I have reviewed the patient's chart and labs.  Questions were answered to the patient's satisfaction.     [name]

## 2016-03-30 NOTE — Progress Notes (Signed)
Removed venous sheath and held manual pressure for 10 minutes. There was minimum bleeding and site achieved hemostasis quickly. VSS and patient was given instructions concerning bed rest and s/s of rebleeding. Pt verbalized understanding and pts daughter was at bedside for instructions.   Will continue to monitor.   Earlie Lou

## 2016-03-31 ENCOUNTER — Other Ambulatory Visit: Payer: Self-pay | Admitting: Physician Assistant

## 2016-03-31 ENCOUNTER — Encounter (HOSPITAL_COMMUNITY): Payer: Self-pay | Admitting: Physician Assistant

## 2016-03-31 DIAGNOSIS — E785 Hyperlipidemia, unspecified: Secondary | ICD-10-CM

## 2016-03-31 DIAGNOSIS — I5031 Acute diastolic (congestive) heart failure: Secondary | ICD-10-CM

## 2016-03-31 DIAGNOSIS — I060 Rheumatic aortic stenosis: Secondary | ICD-10-CM | POA: Diagnosis not present

## 2016-03-31 DIAGNOSIS — I25110 Atherosclerotic heart disease of native coronary artery with unstable angina pectoris: Secondary | ICD-10-CM | POA: Diagnosis not present

## 2016-03-31 DIAGNOSIS — I200 Unstable angina: Secondary | ICD-10-CM

## 2016-03-31 DIAGNOSIS — R001 Bradycardia, unspecified: Secondary | ICD-10-CM

## 2016-03-31 DIAGNOSIS — I2510 Atherosclerotic heart disease of native coronary artery without angina pectoris: Secondary | ICD-10-CM | POA: Diagnosis not present

## 2016-03-31 DIAGNOSIS — I350 Nonrheumatic aortic (valve) stenosis: Secondary | ICD-10-CM | POA: Diagnosis not present

## 2016-03-31 DIAGNOSIS — Z9861 Coronary angioplasty status: Secondary | ICD-10-CM

## 2016-03-31 DIAGNOSIS — I340 Nonrheumatic mitral (valve) insufficiency: Secondary | ICD-10-CM

## 2016-03-31 DIAGNOSIS — I5032 Chronic diastolic (congestive) heart failure: Secondary | ICD-10-CM

## 2016-03-31 HISTORY — DX: Atherosclerotic heart disease of native coronary artery without angina pectoris: Z98.61

## 2016-03-31 HISTORY — DX: Chronic diastolic (congestive) heart failure: I50.32

## 2016-03-31 HISTORY — DX: Atherosclerotic heart disease of native coronary artery without angina pectoris: I25.10

## 2016-03-31 LAB — CBC
HCT: 42.7 % (ref 39.0–52.0)
Hemoglobin: 13.7 g/dL (ref 13.0–17.0)
MCH: 29.9 pg (ref 26.0–34.0)
MCHC: 32.1 g/dL (ref 30.0–36.0)
MCV: 93.2 fL (ref 78.0–100.0)
PLATELETS: 157 10*3/uL (ref 150–400)
RBC: 4.58 MIL/uL (ref 4.22–5.81)
RDW: 14.3 % (ref 11.5–15.5)
WBC: 6.1 10*3/uL (ref 4.0–10.5)

## 2016-03-31 LAB — BASIC METABOLIC PANEL
ANION GAP: 6 (ref 5–15)
BUN: 11 mg/dL (ref 6–20)
CALCIUM: 8.7 mg/dL — AB (ref 8.9–10.3)
CO2: 25 mmol/L (ref 22–32)
CREATININE: 0.96 mg/dL (ref 0.61–1.24)
Chloride: 106 mmol/L (ref 101–111)
Glucose, Bld: 99 mg/dL (ref 65–99)
Potassium: 3.9 mmol/L (ref 3.5–5.1)
SODIUM: 137 mmol/L (ref 135–145)

## 2016-03-31 LAB — HEPATIC FUNCTION PANEL
ALT: 19 U/L (ref 17–63)
AST: 29 U/L (ref 15–41)
Albumin: 3.4 g/dL — ABNORMAL LOW (ref 3.5–5.0)
Alkaline Phosphatase: 61 U/L (ref 38–126)
BILIRUBIN DIRECT: 0.1 mg/dL (ref 0.1–0.5)
BILIRUBIN INDIRECT: 1.4 mg/dL — AB (ref 0.3–0.9)
TOTAL PROTEIN: 5.4 g/dL — AB (ref 6.5–8.1)
Total Bilirubin: 1.5 mg/dL — ABNORMAL HIGH (ref 0.3–1.2)

## 2016-03-31 MED ORDER — POTASSIUM CHLORIDE CRYS ER 20 MEQ PO TBCR: 20.0000 meq | EXTENDED_RELEASE_TABLET | Freq: Every day | ORAL | 3 refills | Status: DC

## 2016-03-31 MED ORDER — FUROSEMIDE 20 MG PO TABS: 20.0000 mg | ORAL_TABLET | Freq: Every day | ORAL | 3 refills | Status: DC

## 2016-03-31 MED ORDER — NITROGLYCERIN 0.4 MG SL SUBL: 0.4000 mg | SUBLINGUAL_TABLET | SUBLINGUAL | 3 refills | Status: DC | PRN

## 2016-03-31 MED ORDER — ATORVASTATIN CALCIUM 80 MG PO TABS
80.0000 mg | ORAL_TABLET | Freq: Every evening | ORAL | 6 refills | Status: DC
Start: 2016-03-31 — End: 2016-06-24

## 2016-03-31 MED ORDER — ATORVASTATIN CALCIUM 80 MG PO TABS: 80.0000 mg | ORAL_TABLET | Freq: Every day | ORAL | Status: DC

## 2016-03-31 MED ORDER — POTASSIUM CHLORIDE CRYS ER 20 MEQ PO TBCR
20.0000 meq | EXTENDED_RELEASE_TABLET | Freq: Every day | ORAL | Status: DC
  Administered 2016-03-31: 10:00:00 20 meq via ORAL
  Filled 2016-03-31: qty 1

## 2016-03-31 MED ORDER — FUROSEMIDE 20 MG PO TABS
20.0000 mg | ORAL_TABLET | Freq: Every day | ORAL | Status: DC
  Administered 2016-03-31: 10:00:00 20 mg via ORAL
  Filled 2016-03-31: qty 1

## 2016-03-31 MED ORDER — TICAGRELOR 90 MG PO TABS: 90.0000 mg | ORAL_TABLET | Freq: Two times a day (BID) | ORAL | 3 refills | Status: DC

## 2016-03-31 NOTE — Discharge Summary (Signed)
Discharge Summary    Patient ID: Douglas Lane,  MRN: [id number], DOB/AGE: 01-22-1946 70 y.o.  Admit date: 03/30/2016 Discharge date: 03/31/2016  Primary Care Provider:  Melinda Crutch Primary Cardiologist: Dr. Radford Pax  Discharge Diagnoses    Principal Problem:   Unstable angina [facility]) Active Problems:   Aortic stenosis   CAD S/P percutaneous coronary angioplasty   Acute diastolic CHF (congestive heart failure) (HCC)   LVH (left ventricular hypertrophy)   Abnormal nuclear stress test   Dyslipidemia   Mitral regurgitation   Sinus bradycardia - baseline HR 50s at times.    Diagnostic Studies/Procedures    1. Cardiac catheterization this admission, please see full report and below for summary.  _____________   History of Present Illness & Hospital Course    Douglas Lane is a 70M with dyslipidemia, seizure disorder, sleep apnea, arthritis, hemorrhoids, rheumatic fever as a child, and recent moderate AS/mild MR, severe basal septal hypertrophy & diastolic dysfunction by echo 03/19/16 who presented to [facility] for planned LHC. He had recent chest pain leading to nuc which was abnormal. LHC 03/31/16: 90% prox-mid RCA s/p DES, 40% prox-mid LAD, 25% D2, 25% prox-mid Cx, elevated LVEDP of 33. There was a large gradient across the aortic valve, but based on the echo and how easily the catheter crossed the valve, Dr. Irish Lack suspected much of the gradient is subvalvular. Dr. Radford Pax plans to further evaluate with TEE down the road after he's had a chance to recover from his PCI. He was started on ASA, Brilinta, and statin. He received 30 day free Brilinta rx in addition to standard rx. Baseline LFTs were sent on day of discharge. He is not on BB due to baseline sinus bradycardia (HR 50s-60). He was started on Lasix 20mg  daily and potassium 45meq daily.  Will get a f/u BMET in [redacted] week along with fasting baseline lipids. He was advised to d/c NSAIDS and consider alternative such as Tylenol given his DAPT.  The patient tolerated the procedure well. He did have a transient hematoma at his radial cath site that resolved with manual pressure. This AM he feels well. He had several questions about his CAD and we spent a lot of time outlining his anatomy and explaining concepts in his stent book he inquired about. Dr. Radford Pax has seen and examined the patient today and feels he is stable for discharge. Already has planned f/u with Dr. Radford Pax on 04/23/16.  If the patient is tolerating statin at time of follow-up appointment, would recommend setting him up for f/u LFTs/lipids 6 weeks from now.  _____________  Discharge Vitals Blood pressure 139/75, pulse 65, temperature 97.4 F (36.3 C), temperature source Oral, resp. rate 10, height 5\' 7"  (1.702 m), weight 180 lb 12.4 oz (82 kg), SpO2 97 %.  Filed Weights   03/30/16 0544 03/31/16 0412  Weight: 180 lb (81.6 kg) 180 lb 12.4 oz (82 kg)    Labs & Radiologic Studies    CBC  Recent Labs  03/29/16 1224 03/30/16 1702 03/31/16 0413  WBC 4.8  --  6.1  NEUTROABS 2,688  --   --   HGB 15.2  --  13.7  HCT 45.9  --  42.7  MCV 92.0  --  93.2  PLT 196 164 [id number]   Basic Metabolic Panel  Recent Labs  03/29/16 1224 03/31/16 0413  NA 138 137  K 4.9 3.9  CL 103 106  CO2 29 25  GLUCOSE 70 99  BUN 17 11  CREATININE  0.95 0.96  CALCIUM 9.1 8.7*   Liver Function Tests  Recent Labs  03/31/16 0413  AST 29  ALT 19  ALKPHOS 61  BILITOT 1.5*  PROT 5.4*  ALBUMIN 3.4*   _____________  No results found. Disposition   Pt is being discharged home today in good condition.  Follow-up Plans & Appointments    Follow-up Information    [place] [facility] .   Specialty:  Cardiology Why:  Please come to the lab on 04/07/16 to recheck your kidney function, electrolytes, and cholseterol. You may come any time between 8am-4pm but you need to be fasting at least 6 hours before these labs. Follow-up with Dr. Radford Pax as previously scheduled  below  Contact information: [address] [place] [phone]         Discharge Instructions    Diet - low sodium heart healthy    Complete by:  As directed   Increase activity slowly    Complete by:  As directed   No driving for 2 days. No lifting over 5 lbs for 1 week. No sexual activity for 1 week. Keep procedure site clean & dry. If you notice increased pain, swelling, bleeding or pus, call/return!  You may shower, but no soaking baths/hot tubs/pools for 1 week.   Patients taking blood thinners should generally stay away from medicines like ibuprofen, Advil, Motrin, naproxen, and Aleve due to risk of stomach bleeding. You may take Tylenol as directed or talk to your primary doctor about alternatives.  You were started on Brilinta (in addition to your aspirin) to help keep your stent open. You were started on atorvastatin to slow progression of plaque build-up. You were started on furosemide and potassium to help combat fluid overload from the thickening of your heart muscle. The nitroglycerin prescription is as-needed for chest pain.      Discharge Medications     Medication List    STOP taking these medications   ibuprofen 200 MG tablet Commonly known as:  ADVIL,MOTRIN     TAKE these medications   aspirin 81 MG tablet Take 81 mg by mouth daily.   atorvastatin 80 MG tablet Commonly known as:  LIPITOR Take 1 tablet (80 mg total) by mouth every evening.   clobetasol ointment 0.05 % Commonly known as:  TEMOVATE Apply 1 application topically as needed (skin irritation). Daily   desonide 0.05 % cream Commonly known as:  DESOWEN Apply 1 application topically as needed.   diphenhydrAMINE 2 % cream Commonly known as:  BENADRYL Apply topically as needed for itching.   furosemide 20 MG tablet Commonly known as:  LASIX Take 1 tablet (20 mg total) by mouth daily.   HUMIRA PEN 40 MG/0.8ML Pnkt Generic drug:  Adalimumab Inject  40 mg into the skin every 21 ( twenty-one) days.   levETIRAcetam 750 MG tablet Commonly known as:  KEPPRA Take 750 mg by mouth 2 (two) times daily.   nitroGLYCERIN 0.4 MG SL tablet Commonly known as:  NITROSTAT Place 1 tablet (0.4 mg total) under the tongue every 5 (five) minutes as needed for chest pain (up to 3 doses).   potassium chloride SA 20 MEQ tablet Commonly known as:  K-DUR,KLOR-CON Take 1 tablet (20 mEq total) by mouth daily.   PROCTOZONE-HC 2.5 % rectal cream Generic drug:  hydrocortisone Apply 1 application topically daily as needed for hemorrhoids or itching.   ticagrelor 90 MG Tabs tablet Commonly known as:  BRILINTA Take  1 tablet (90 mg total) by mouth 2 (two) times daily.       Allergies:  No Known Allergies    Outstanding Labs/Studies   BMET, FLP in 1 week  Duration of Discharge Encounter   Greater than 30 minutes including physician time.  Signed, Charlie Pitter PA-C 03/31/2016, 9:28 AM

## 2016-03-31 NOTE — Progress Notes (Signed)
Patient: Douglas Lane / Admit Date: 03/30/2016 / Date of Encounter: 03/31/2016, 8:55 AM   Subjective: Feeling good. No further CP. Just feeling kind of tired today - only slept in 2 hour bursts. No SOB.  Objective: Telemetry: NSR, occ PVCs, occ bradycardia Physical Exam: Blood pressure 139/75, pulse 65, temperature 97.4 F (36.3 C), temperature source Oral, resp. rate 10, height 5\' 7"  (1.702 m), weight 180 lb 12.4 oz (82 kg), SpO2 97 %. General: Well developed, well nourished WM, in no acute distress. Head: Normocephalic, atraumatic, sclera non-icteric, no xanthomas, nares are without discharge. Neck: Negative for carotid bruits. JVP not elevated. Lungs: Clear bilaterally to auscultation without wheezes, rales, or rhonchi. Breathing is unlabored. Heart: RRR S1 S2 , 2-3/6 SEM without rubs or gallops - heard across precordium.   Abdomen: Soft, non-tender, non-distended with normoactive bowel sounds. No rebound/guarding. Extremities: No clubbing or cyanosis. No edema. Distal pedal pulses are 2+ and equal bilaterally. Right radial cath with mild ecchymosis proximally, no residual hematoma, good pulse Neuro: Alert and oriented X 3. Moves all extremities spontaneously. Psych:  Responds to questions appropriately with a normal affect.   Intake/Output Summary (Last 24 hours) at 03/31/16 0855 Last data filed at 03/31/16 0835  Gross per 24 hour  Intake           997.31 ml  Output              895 ml  Net           102.31 ml    Inpatient Medications:  . aspirin  81 mg Oral Daily  . levETIRAcetam  750 mg Oral BID  . sodium chloride flush  3 mL Intravenous Q12H  . ticagrelor  90 mg Oral BID   Infusions:    Labs:  Recent Labs  03/29/16 1224 03/31/16 0413  NA 138 137  K 4.9 3.9  CL 103 106  CO2 29 25  GLUCOSE 70 99  BUN 17 11  CREATININE 0.95 0.96  CALCIUM 9.1 8.7*   No results for input(s): AST, ALT, ALKPHOS, BILITOT, PROT, ALBUMIN in the last 72 hours.  Recent Labs  03/29/16 1224 03/30/16 1702 03/31/16 0413  WBC 4.8  --  6.1  NEUTROABS 2,688  --   --   HGB 15.2  --  13.7  HCT 45.9  --  42.7  MCV 92.0  --  93.2  PLT 196 164 157   No results for input(s): CKTOTAL, CKMB, TROPONINI in the last 72 hours. Invalid input(s): POCBNP No results for input(s): HGBA1C in the last 72 hours.   Radiology/Studies:  No results found.   Assessment and Plan  81M with dyslipidemia, seizure disorder, rheumatic fever as a child, moderate AS, mild MR, severe basal septal hypertrophy & diastolic dysfunction by echo 03/19/16 who presented to [facility] for planned LHC. He had recent chest pain leading to nuc which was abnormal. LHC 03/31/16: 90% prox-mid RCA s/p DES, elevated LVEDP. There was a large gradient across the aortic valve, but based on the echo and how easily the catheter crossed the valve, Dr. Irish Lack suspected much of the gradient is subvalvular.  1. CAD with recent unstable angina - continue ASA, Brilinta. Add statin. Add baseline LFTs. Needs OP fasting lipids.  2. Aortic stenosis - per d/w Dr. Radford Pax, may need outpt TEE once recovered from PCI.  3. Acute diastolic CHF with severe basal septal hypertrophy - will review plan for possible diuresis based on LVEDP.  Has f/u with TT  04/23/16 at 1:45pm.   Signed, Melina Copa PA-C Pager: [phone]

## 2016-03-31 NOTE — Progress Notes (Signed)
Pt given all discharge instructions and pt verbalized understanding. Pt has been given a copy of his cath report as ordered. Pt understands prescriptions and has been given the script for 30 day brilinta. All belongings with pt.

## 2016-03-31 NOTE — Progress Notes (Signed)
CARDIAC REHAB PHASE I   PRE:  Rate/Rhythm: 67 SR    BP: sitting 152/97    SaO2:   MODE:  Ambulation: 1000 ft   POST:  Rate/Rhythm: 90 SR    BP: sitting 150/82     SaO2:   Tolerated well, no c/o. Ed completed. Pt is thinking that he does not want to do Twilight study. Reinforced need for Brilinta/ASA. Will send referral to [place]. Answered all of pts questions. [place], ACSM 03/31/2016 9:47 AM

## 2016-04-05 ENCOUNTER — Encounter: Payer: Self-pay | Admitting: Physician Assistant

## 2016-04-07 ENCOUNTER — Other Ambulatory Visit: Payer: Medicare Other | Admitting: *Deleted

## 2016-04-07 DIAGNOSIS — I5031 Acute diastolic (congestive) heart failure: Secondary | ICD-10-CM

## 2016-04-07 DIAGNOSIS — I2510 Atherosclerotic heart disease of native coronary artery without angina pectoris: Secondary | ICD-10-CM

## 2016-04-07 LAB — LIPID PANEL
CHOLESTEROL: 197 mg/dL (ref 125–200)
HDL: 37 mg/dL — ABNORMAL LOW (ref >=40)
LDL Cholesterol: 134 mg/dL — ABNORMAL HIGH (ref <130)
TRIGLYCERIDES: 131 mg/dL (ref <150)
Total CHOL/HDL Ratio: 5.3 Ratio — ABNORMAL HIGH (ref <=5.0)
VLDL: 26 mg/dL (ref <30)

## 2016-04-07 LAB — BASIC METABOLIC PANEL
BUN: 24 mg/dL (ref 7–25)
CALCIUM: 9.4 mg/dL (ref 8.6–10.3)
CO2: 28 mmol/L (ref 20–31)
Chloride: 103 mmol/L (ref 98–110)
Creat: 1.06 mg/dL (ref 0.70–1.18)
Glucose, Bld: 81 mg/dL (ref 65–99)
POTASSIUM: 4.1 mmol/L (ref 3.5–5.3)
SODIUM: 140 mmol/L (ref 135–146)

## 2016-04-07 NOTE — Addendum Note (Signed)
Addended by: Eulis Foster on: 04/07/2016 11:39 AM   Modules accepted: Orders

## 2016-04-09 ENCOUNTER — Encounter: Payer: Self-pay | Admitting: Cardiology

## 2016-04-09 ENCOUNTER — Other Ambulatory Visit: Payer: Self-pay | Admitting: *Deleted

## 2016-04-09 DIAGNOSIS — E785 Hyperlipidemia, unspecified: Secondary | ICD-10-CM

## 2016-04-15 ENCOUNTER — Telehealth: Payer: Self-pay | Admitting: Cardiology

## 2016-04-15 NOTE — Telephone Encounter (Signed)
New Message   Pt call requesting to speak with Rn. Pt states his nose is bleeding and would like to discuss with RN. Please call back to discuss

## 2016-04-15 NOTE — Telephone Encounter (Signed)
Patient st his nose bled for for about 1 hour today after blowing his nose. He states he had no trauma to his nose, but his has had itchy eyes and a runny nose for a while now. He denies taking anything for allergies, but is on a 7 day course of antibiotics for pink eye and a stye.  It resolved and he has had no bleeding since.  Instructed the patient to continue to monitor and to call again if bleeding recurs. He was grateful for call.

## 2016-04-22 NOTE — Progress Notes (Signed)
Cardiology Office Note    Date:  04/23/2016   ID:  Douglas Lane, DOB [birth year], MRN [id number]  PCP:   Melinda Crutch, MD  Cardiologist:  Fransico Him, MD   Chief Complaint  Patient presents with  . Aortic Stenosis  . Coronary Artery Disease  . Hypertension    History of Present Illness:  Douglas Lane is a 70 y.o. male with dyslipidemia, seizure disorder, sleep apnea, arthritis, hemorrhoids, rheumatic fever as a child, and recent moderate AS/mild MR, severe basal septal hypertrophy & diastolic dysfunction by echo 03/19/16 who presented to [facility] for planned LHC. He had recent chest pain leading to nuc which was abnormal. LHC 03/31/16: 90% prox-mid RCA s/p DES, 40% prox-mid LAD, 25% D2, 25% prox-mid Cx, elevated LVEDP of 33. There was a large gradient across the aortic valve, but based on the echo and how easily the catheter crossed the valve, Dr. Irish Lack suspected much of the gradient is subvalvular.  He was started on ASA, Brilinta, and statin. He is not on BB due to baseline sinus bradycardia (HR 50s-60).   He is now here for followup today.  He is doing well.  He has not had any further chest pain.  He denies any SOB, DOE, LE edema, dizziness, palpitations or syncope.  He is tolerating his meds without any problems.    Past Medical History:  Diagnosis Date  . Aortic stenosis    a. moderate by echo 03/2016; based on cath 03/2016 it is suspected that much of his gradient is subvalvular.  . Arthritis    "pain in my knees" (03/30/2016)  . CAD S/P percutaneous coronary angioplasty 03/31/2016   a. CP/[place] -> LHC 03/31/16: 90% prox-mid RCA s/p DES, 40% prox-mid LAD, 25% D2, 25% prox-mid Cx, elevated LVEDP 33.  . Chronic diastolic CHF (congestive heart failure) ([place]) 03/31/2016  . Chronic lower back pain   . Dyslipidemia   . Heart murmur    "since I was a kid" (03/30/2016)  . LVH (left ventricular hypertrophy)    a. severe basal septal hypertrophy by echo 03/2016.  . Mitral regurgitation    a. mild by echo 03/2016.  Marland Kitchen Psoriasis   . Rheumatic fever    as a child  . Seizure ([place]) 01/2011; 07/2011; 07/2015      "idiopathic; been on Kepra since 07/2011"  . Sinus bradycardia - baseline HR 50s at times.   . Sleep apnea   . 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 Thrombosed hemorrhoids     Past Surgical History:  Procedure Laterality Date  . APPENDECTOMY    . BACK SURGERY    . CARDIAC CATHETERIZATION N/A 03/30/2016   Procedure: Right/Left Heart Cath and Coronary Angiography;  Surgeon: Jettie Booze, MD;  Location: [facility];  Service: Cardiovascular;  Laterality: N/A;  . CARDIAC CATHETERIZATION N/A 03/30/2016   Procedure: Coronary Stent Intervention;  Surgeon: Jettie Booze, MD;  Location: [facility];  Service: Cardiovascular;  Laterality: N/A;  . FOREARM FRACTURE SURGERY  ~ 2003-2004   "got steel plate in there"  . I&D hemorrhoirds    . KNEE ARTHROSCOPY Bilateral   . MOHS SURGERY Left    temple  . ORIF SCAPULAR FRACTURE Right    right  . POSTERIOR LUMBAR FUSION  2004  . TONSILLECTOMY      Current Medications: Outpatient Medications Prior to Visit  Medication Sig Dispense Refill  . aspirin 81 MG tablet Take 81  mg by mouth daily.    Marland Kitchen atorvastatin (LIPITOR) 80 MG tablet Take 1 tablet (80 mg total) by mouth every evening. 30 tablet 6  . clobetasol ointment (TEMOVATE) [id number] % Apply 1 application topically as needed (skin irritation). Daily  1  . desonide (DESOWEN) 0.05 % cream Apply 1 application topically as needed.    . diphenhydrAMINE (BENADRYL) 2 % cream Apply topically as needed for itching.    . furosemide (LASIX) 20 MG tablet Take 1 tablet (20 mg total) by mouth daily. 30 tablet 3  . HUMIRA PEN 40 MG/0.8ML PNKT Inject 40 mg into the skin every 21 ( twenty-one) days.     Marland Kitchen levETIRAcetam (KEPPRA) 750 MG tablet Take 750 mg by mouth 2 (two) times daily.    . nitroGLYCERIN (NITROSTAT) 0.4 MG SL tablet Place 1 tablet (0.4 mg total) under  the tongue every 5 (five) minutes as needed for chest pain (up to 3 doses). 25 tablet 3  . potassium chloride SA (K-DUR,KLOR-CON) 20 MEQ tablet Take 1 tablet (20 mEq total) by mouth daily. 30 tablet 3  . PROCTOZONE-HC 2.5 % rectal cream Apply 1 application topically daily as needed for hemorrhoids or itching.     . ticagrelor (BRILINTA) 90 MG TABS tablet Take 1 tablet (90 mg total) by mouth 2 (two) times daily. 180 tablet 3   No facility-administered medications prior to visit.      Allergies:   Review of patient's allergies indicates no known allergies.   Social History   Social History  . Marital status: Married    Spouse name: N/A  . Number of children: N/A  . Years of education: N/A   Social History Main Topics  . Smoking status: Never Smoker  . Smokeless tobacco: Never Used  . Alcohol use 9.0 oz/week    10 Cans of beer, 5 Shots of liquor per week  . Drug use: No  . Sexual activity: Not Currently   Other Topics Concern  . None   Social History Narrative  . None     Family History:  The patient's family history includes Congestive Heart Failure in his father; Depression in his sister; Renal Disease in his mother.   ROS:   Please see the history of present illness.    ROS All other systems reviewed and are negative.   PHYSICAL EXAM:   VS:  BP 120/68   Pulse 61   Ht 5\' 7"  (1.702 m)   Wt 178 lb (80.7 kg)   SpO2 97%   BMI 27.88 kg/m    GEN: Well nourished, well developed, in no acute distress  HEENT: normal  Neck: no JVD or masses Cardiac: RRR; no rubs, or gallops,no edema.  Intact distal pulses bilaterally. 2/6 SM at RUSB to LLSB and carotid arteries bilaterally Respiratory:  clear to auscultation bilaterally, normal work of breathing GI: soft, nontender, nondistended, + BS MS: no deformity or atrophy  Skin: warm and dry, no rash Neuro:  Alert and Oriented x 3, Strength and sensation are intact Psych: euthymic mood, full affect  Wt Readings from Last 3  Encounters:  04/23/16 178 lb (80.7 kg)  03/31/16 180 lb 12.4 oz (82 kg)  03/29/16 185 lb (83.9 kg)      Studies/Labs Reviewed:   EKG:  EKG is ordered today and showed NSR with LVH and repolarization abnormality with no change from EKG 02/2016  Recent Labs: 03/31/2016: ALT 19; Hemoglobin 13.7; Platelets 157 04/07/2016: BUN 24; Creat 1.06; Potassium 4.1; Sodium  140   Lipid Panel    Component Value Date/Time   CHOL 197 04/07/2016 1139   TRIG 131 04/07/2016 1139   HDL 37 (L) 04/07/2016 1139   CHOLHDL 5.3 (H) 04/07/2016 1139   VLDL 26 04/07/2016 1139   LDLCALC 134 (H) 04/07/2016 1139    Additional studies/ records that were reviewed today include:  Cath report    ASSESSMENT:    1. CAD S/P percutaneous coronary angioplasty   2. Aortic stenosis   3. Sinus bradycardia - baseline HR 50s at times.   4. Dyslipidemia      PLAN:  In order of problems listed above:  1. ASCAD with recent cath for USAP showing 90% prox-mid RCA s/p DES, 40% prox-mid LAD, 25% D2, 25% prox-mid Cx, elevated LVEDP of 33.  He has not had any further anginal symptoms.  Continue ASA/Brilinta/statin. 2. Aortic stenosis - moderate on echo but valve easily crossed at cath. Will repeat echo in 6 months to assess for progression.  3. Sinus bradycardia - resolved 4. Dyslipidemia - LDL goal < 70.  Continue statin therapy. Repeat FLP and ALT in 4 weeks.    Medication Adjustments/Labs and Tests Ordered: Current medicines are reviewed at length with the patient today.  Concerns regarding medicines are outlined above.  Medication changes, Labs and Tests ordered today are listed in the Patient Instructions below.  There are no Patient Instructions on file for this visit.   Signed, Fransico Him, MD  04/23/2016 2:02 PM    [facility] [place], [place],   [address] Phone: [phone]; Fax: [phone]

## 2016-04-23 ENCOUNTER — Ambulatory Visit (INDEPENDENT_AMBULATORY_CARE_PROVIDER_SITE_OTHER): Payer: Medicare Other | Admitting: Cardiology

## 2016-04-23 ENCOUNTER — Encounter: Payer: Self-pay | Admitting: Cardiology

## 2016-04-23 VITALS — BP 120/68 | HR 61 | Ht 67.0 in | Wt 178.0 lb

## 2016-04-23 DIAGNOSIS — I350 Nonrheumatic aortic (valve) stenosis: Secondary | ICD-10-CM

## 2016-04-23 DIAGNOSIS — I2510 Atherosclerotic heart disease of native coronary artery without angina pectoris: Secondary | ICD-10-CM | POA: Diagnosis not present

## 2016-04-23 DIAGNOSIS — Z9861 Coronary angioplasty status: Secondary | ICD-10-CM | POA: Diagnosis not present

## 2016-04-23 DIAGNOSIS — E785 Hyperlipidemia, unspecified: Secondary | ICD-10-CM

## 2016-04-23 DIAGNOSIS — R001 Bradycardia, unspecified: Secondary | ICD-10-CM

## 2016-04-23 NOTE — Patient Instructions (Signed)
Medication Instructions:  1) STOP LASIX 2) STOP POTASSIUM  Labwork: Your physician recommends that you return for FASTING lab work in: 4 WEEKS   Testing/Procedures: Your physician has requested that you have an echocardiogram in 6 months. Echocardiography is a painless test that uses sound waves to create images of your heart. It provides your doctor with information about the size and shape of your heart and how well your heart's chambers and valves are working. This procedure takes approximately one hour. There are no restrictions for this procedure.  Follow-Up: Your physician wants you to follow-up in: 6 months with Dr. Radford Pax. You will receive a reminder letter in the mail two months in advance. If you don't receive a letter, please call our office to schedule the follow-up appointment.   Any Other Special Instructions Will Be Listed Below (If Applicable).     If you need a refill on your cardiac medications before your next appointment, please call your pharmacy.

## 2016-04-26 ENCOUNTER — Other Ambulatory Visit: Payer: Self-pay

## 2016-04-26 ENCOUNTER — Encounter: Payer: Self-pay | Admitting: Cardiology

## 2016-04-26 ENCOUNTER — Other Ambulatory Visit: Payer: Self-pay | Admitting: Physician Assistant

## 2016-04-26 MED ORDER — TICAGRELOR 90 MG PO TABS: 90.0000 mg | ORAL_TABLET | Freq: Two times a day (BID) | ORAL | 3 refills | Status: DC

## 2016-05-06 ENCOUNTER — Encounter (HOSPITAL_COMMUNITY): Payer: Self-pay

## 2016-05-06 ENCOUNTER — Encounter (HOSPITAL_COMMUNITY)
Admission: RE | Admit: 2016-05-06 | Discharge: 2016-05-06 | Disposition: A | Discharge status: Home / Self Care | Payer: Medicare Other | Source: Ambulatory Visit | Attending: Cardiology | Admitting: Cardiology

## 2016-05-06 VITALS — BP 142/80 | HR 76 | Ht 65.5 in | Wt 180.1 lb

## 2016-05-06 DIAGNOSIS — Z955 Presence of coronary angioplasty implant and graft: Secondary | ICD-10-CM

## 2016-05-06 NOTE — Progress Notes (Signed)
Cardiac Individual Treatment Plan  Patient Details  Name: Douglas Lane MRN: [id number] Date of Birth: 02/09/1946 Referring Provider:   Flowsheet Row CARDIAC REHAB PHASE II ORIENTATION from 05/06/2016 in [facility]  Referring Provider  Fransico Him MD      Initial Encounter Date:  [place] PHASE II ORIENTATION from 05/06/2016 in [facility]  Date  05/06/16  Referring Provider  Fransico Him MD      Visit Diagnosis: 03/30/16 Status post coronary artery stent placement  Patient's Home Medications on Admission:  Current Outpatient Prescriptions:  .  acetaminophen (TYLENOL) 500 MG tablet, Take 1,000 mg by mouth every 8 (eight) hours as needed., Disp: , Rfl:  .  aspirin 81 MG tablet, Take 81 mg by mouth daily., Disp: , Rfl:  .  atorvastatin (LIPITOR) 80 MG tablet, Take 1 tablet (80 mg total) by mouth every evening., Disp: 30 tablet, Rfl: 6 .  HUMIRA PEN 40 MG/0.8ML PNKT, Inject 40 mg into the skin every 21 ( twenty-one) days. , Disp: , Rfl:  .  levETIRAcetam (KEPPRA) 750 MG tablet, Take 750 mg by mouth 2 (two) times daily., Disp: , Rfl:  .  nitroGLYCERIN (NITROSTAT) 0.4 MG SL tablet, Place 1 tablet (0.4 mg total) under the tongue every 5 (five) minutes as needed for chest pain (up to 3 doses)., Disp: 25 tablet, Rfl: 3 .  PROCTOZONE-HC 2.5 % rectal cream, Apply 1 application topically daily as needed for hemorrhoids or itching. , Disp: , Rfl:  .  ticagrelor (BRILINTA) 90 MG TABS tablet, Take 1 tablet (90 mg total) by mouth 2 (two) times daily., Disp: 180 tablet, Rfl: 3 .  clobetasol ointment (TEMOVATE) [id number] %, Apply 1 application topically as needed (skin irritation). Daily, Disp: , Rfl: 1 .  desonide (DESOWEN) 0.05 % cream, Apply 1 application topically as needed., Disp: , Rfl:  .  diphenhydrAMINE (BENADRYL) 2 % cream, Apply topically as needed for itching., Disp: , Rfl:   Past Medical History: Past  Medical History:  Diagnosis Date  . Aortic stenosis    a. moderate by echo 03/2016; based on cath 03/2016 it is suspected that much of his gradient is subvalvular.  . Arthritis    "pain in my knees" (03/30/2016)  . CAD S/P percutaneous coronary angioplasty 03/31/2016   a. CP/USA -> LHC 03/31/16: 90% prox-mid RCA s/p DES, 40% prox-mid LAD, 25% D2, 25% prox-mid Cx, elevated LVEDP 33.  . Chronic diastolic CHF (congestive heart failure) ([place]) 03/31/2016  . Chronic lower back pain   . Dyslipidemia   . Heart murmur    "since I was a kid" (03/30/2016)  . LVH (left ventricular hypertrophy)    a. severe basal septal hypertrophy by echo 03/2016.  . Mitral regurgitation    a. mild by echo 03/2016.  Marland Kitchen Psoriasis   . Rheumatic fever    as a child  . 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 Sinus bradycardia - baseline HR 50s at times.   . Sleep apnea   . 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 Thrombosed hemorrhoids     Tobacco Use: History  Smoking Status  . Never Smoker  Smokeless Tobacco  . Never Used    Labs: Recent Review Flowsheet Data    Labs for ITP Cardiac and Pulmonary Rehab Latest Ref Rng & Units 01/27/2011 08/09/2011 03/30/2016 03/30/2016 04/07/2016  Cholestrol 125 - 200 mg/dL - - - - 197   LDLCALC <130 mg/dL - - - - 134(H)   HDL >=40 mg/dL - - - - 37(L)   Trlycerides <150 mg/dL - - - - 131   PHART 7.350 - 7.450 7.366 - 7.398 - -   PCO2ART 35.0 - 45.0 mmHg 39.0 - 44.1 - -   HCO3 20.0 - 24.0 mEq/L 22.4 - 27.2(H) 25.1(H) -   TCO2 0 - 100 mmol/L 24 25 29 27  -   ACIDBASEDEF 0.0 - 2.0 mmol/L 3.0(H) - - 1.0 -   O2SAT % 95.0 - 91.0 67.0 -      Capillary Blood Glucose: Lab Results  Component Value Date   GLUCAP 123 (H) 08/09/2011     Exercise Target Goals: Date: 05/06/16  Exercise Program Goal: Individual exercise prescription set with THRR, safety & activity barriers. Participant demonstrates ability to understand and  report RPE using BORG scale, to self-measure pulse accurately, and to acknowledge the importance of the exercise prescription.  Exercise Prescription Goal: Starting with aerobic activity 30 plus minutes a day, 3 days per week for initial exercise prescription. Provide home exercise prescription and guidelines that participant acknowledges understanding prior to discharge.  Activity Barriers & Risk Stratification:     Activity Barriers & Cardiac Risk Stratification - 05/06/16 1215      Activity Barriers & Cardiac Risk Stratification   Cardiac Risk Stratification High      6 Minute Walk:     6 Minute Walk    Row Name 05/06/16 1403         6 Minute Walk   Phase Initial     Distance 1662 feet     Walk Time 6 minutes     # of Rest Breaks 0     MPH 3.14     METS 3.43     RPE 13     VO2 Peak 12.02     Symptoms No     Resting HR 76 bpm     Resting BP 142/80     Max Ex. HR 106 bpm     Max Ex. BP 136/80     2 Minute Post BP 140/76  5 minutes later 126/86        Initial Exercise Prescription:     Initial Exercise Prescription - 05/06/16 1400      Date of Initial Exercise RX and Referring Provider   Date 05/06/16   Referring Provider Fransico Him MD     Treadmill   MPH 2.5   Grade 1   Minutes 10   METs 3.43     Bike   Level 0.08   Minutes 10   METs 2.89     NuStep   Level 3   Minutes 10   METs 2     Prescription Details   Frequency (times per week) 3   Duration Progress to 30 minutes of continuous aerobic without signs/symptoms of physical distress     Intensity   THRR 40-80% of Max Heartrate 60-119   Ratings of Perceived Exertion 11-13   Perceived Dyspnea 0-4     Progression   Progression Continue to progress workloads to maintain intensity without signs/symptoms of physical distress.     Resistance Training   Training Prescription Yes   Weight 2lbs   Reps 10-12      Perform Capillary Blood Glucose checks as needed.  Exercise Prescription  Changes:   Exercise Comments:   Discharge Exercise  Prescription (Final Exercise Prescription Changes):   Nutrition:  Target Goals: Understanding of nutrition guidelines, daily intake of sodium 1500mg , cholesterol 200mg , calories 30% from fat and 7% or less from saturated fats, daily to have 5 or more servings of fruits and vegetables.  Biometrics:     Pre Biometrics - 05/06/16 1212      Pre Biometrics   Waist Circumference 40.5 inches   Hip Circumference 39.75 inches   Waist to Hip Ratio 1.02 %   Triceps Skinfold 16 mm   % Body Fat 29.1 %   Grip Strength 42 kg   Flexibility 12 in   Single Leg Stand 30 seconds       Nutrition Therapy Plan and Nutrition Goals:   Nutrition Discharge: Nutrition Scores:   Nutrition Goals Re-Evaluation:   Psychosocial: Target Goals: Acknowledge presence or absence of depression, maximize coping skills, provide positive support system. Participant is able to verbalize types and ability to use techniques and skills needed for reducing stress and depression.  Initial Review & Psychosocial Screening:     Initial Psych Review & Screening - 05/06/16 [place]? Yes     Barriers   Psychosocial barriers to participate in program There are no identifiable barriers or psychosocial needs.     Screening Interventions   Interventions Encouraged to exercise      Quality of Life Scores:     Quality of Life - 05/06/16 1402      Quality of Life Scores   Health/Function Pre 18.47 %   Socioeconomic Pre 22.64 %   Psych/Spiritual Pre 21.43 %   Family Pre 22.8 %   GLOBAL Pre 20.57 %      PHQ-9: Recent Review Flowsheet Data    There is no flowsheet data to display.      Psychosocial Evaluation and Intervention:   Psychosocial Re-Evaluation:   Vocational Rehabilitation: Provide vocational rehab assistance to qualifying candidates.   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 Participant will state understanding/return demonstration of topics presented.  Learning Barriers/Preferences:     Learning Barriers/Preferences - 05/06/16 1409      Learning Barriers/Preferences   Learning Barriers Sight      Education Topics: Count Your Pulse:  -Group instruction provided by verbal instruction, demonstration, patient participation and written materials to support subject.  Instructors address importance of being able to find your pulse and how to count your pulse when at home without a heart monitor.  Patients get hands on experience counting their pulse with staff help and individually.   Heart Attack, Angina, and Risk Factor Modification:  -Group instruction provided by verbal instruction, video, and written materials to support subject.  Instructors address signs and symptoms of angina and heart attacks.    Also discuss risk factors for heart disease and how to make changes to improve heart health risk factors.   Functional Fitness:  -Group instruction provided by verbal instruction, demonstration, patient participation, and written materials to support subject.  Instructors address safety measures for doing things around the house.  Discuss how to get up and down off the floor, how to pick  things up properly, how to safely get out of a chair without assistance, and balance training.   Meditation and Mindfulness:  -Group instruction provided by verbal instruction, patient participation, and written materials to support subject.  Instructor addresses importance of mindfulness and meditation practice to help reduce stress and improve awareness.  Instructor also leads  participants through a meditation exercise.    Stretching for Flexibility and Mobility:  -Group instruction provided by verbal instruction, patient participation, and written materials to support subject.  Instructors lead participants through series of stretches that are designed to increase flexibility thus improving mobility.  These stretches are additional exercise for major muscle groups that are typically performed during regular warm up and cool down.   Hands Only CPR Anytime:  -Group instruction provided by verbal instruction, video, patient participation and written materials to support subject.  Instructors co-teach with AHA video for hands only CPR.  Participants get hands on experience with mannequins.   Nutrition I class: Heart Healthy Eating:  -Group instruction provided by PowerPoint slides, verbal discussion, and written materials to support subject matter. The instructor gives an explanation and review of the Therapeutic Lifestyle Changes diet recommendations, which includes a discussion on lipid goals, dietary fat, sodium, fiber, plant stanol/sterol esters, sugar, and the components of a well-balanced, healthy diet.   Nutrition II class: Lifestyle Skills:  -Group instruction provided by PowerPoint slides, verbal discussion, and written materials to support subject matter. The instructor gives an explanation and review of label reading, grocery shopping for heart health, heart healthy recipe modifications, and ways to make healthier choices when eating out.   Diabetes Question & Answer:  -Group instruction provided by PowerPoint slides, verbal discussion, and written materials to support subject matter. The instructor gives an explanation and review of diabetes co-morbidities, pre- and post-prandial blood glucose goals, pre-exercise blood glucose goals, signs, symptoms, and treatment of hypoglycemia and hyperglycemia, and foot care basics.   Diabetes Blitz:  -Group  instruction provided by PowerPoint slides, verbal discussion, and written materials to support subject matter. The instructor gives an explanation and review of the physiology behind type 1 and type 2 diabetes, diabetes medications and rational behind using different medications, pre- and post-prandial blood glucose recommendations and Hemoglobin A1c goals, diabetes diet, and exercise including blood glucose guidelines for exercising safely.    Portion Distortion:  -Group instruction provided by PowerPoint slides, verbal discussion, written materials, and food models to support subject matter. The instructor gives an explanation of serving size versus portion size, changes in portions sizes over the last 20 years, and what consists of a serving from each food group.   Stress Management:  -Group instruction provided by verbal instruction, video, and written materials to support subject matter.  Instructors review role of stress in heart disease and how to cope with stress positively.     Exercising on Your Own:  -Group instruction provided by verbal instruction, power point, and written materials to support subject.  Instructors discuss benefits of exercise, components of exercise, frequency and intensity of exercise, and end points for exercise.  Also discuss use of nitroglycerin and activating EMS.  Review options of places to exercise outside of rehab.  Review guidelines for sex with heart disease.   Cardiac Drugs I:  -Group instruction provided by verbal instruction and written materials to support subject.  Instructor reviews cardiac drug classes: antiplatelets, anticoagulants, beta blockers, and statins.  Instructor discusses reasons, side effects, and lifestyle considerations for each drug class.   Cardiac Drugs  II:  -Group instruction provided by verbal instruction and written materials to support subject.  Instructor reviews cardiac drug classes: angiotensin converting enzyme inhibitors  (ACE-I), angiotensin II receptor blockers (ARBs), nitrates, and calcium channel blockers.  Instructor discusses reasons, side effects, and lifestyle considerations for each drug class.   Anatomy and Physiology of the Circulatory System:  -Group instruction provided by verbal instruction, video, and written materials to support subject.  Reviews functional anatomy of heart, how it relates to various diagnoses, and what role the heart plays in the overall system.   Knowledge Questionnaire Score:     Knowledge Questionnaire Score - 05/06/16 1211      Knowledge Questionnaire Score   Pre Score 18/24      Core Components/Risk Factors/Patient Goals at Admission:     Personal Goals and Risk Factors at Admission - 05/06/16 1410      Core Components/Risk Factors/Patient Goals on Admission   Lipids Yes   Intervention Provide education and support for participant on nutrition & aerobic/resistive exercise along with prescribed medications to achieve LDL 70mg , HDL >40mg .   Expected Outcomes Short Term: Participant states understanding of desired cholesterol values and is compliant with medications prescribed. Participant is following exercise prescription and nutrition guidelines.;Long Term: Cholesterol controlled with medications as prescribed, with individualized exercise RX and with personalized nutrition plan. Value goals: LDL < 70mg , HDL > 40 mg.   Stress Yes   Intervention Offer individual and/or small group education and counseling on adjustment to heart disease, stress management and health-related lifestyle change. Teach and support self-help strategies.;Refer participants experiencing significant psychosocial distress to appropriate mental health specialists for further evaluation and treatment. When possible, include family members and significant others in education/counseling sessions.   Expected Outcomes Short Term: Participant demonstrates changes in health-related behavior, relaxation  and other stress management skills, ability to obtain effective social support, and compliance with psychotropic medications if prescribed.;Long Term: Emotional wellbeing is indicated by absence of clinically significant psychosocial distress or social isolation.      Core Components/Risk Factors/Patient Goals Review:    Core Components/Risk Factors/Patient Goals at Discharge (Final Review):    ITP Comments:     ITP Comments    Row Name 05/06/16 0933           ITP Comments Dr. Loreta Ave, Medical Director          Comments:Patient attended orientation from 0800 to 1015  to review rules and guidelines for program. Completed 6 minute walk test, Intitial ITP, and exercise prescription.  VSS. Telemetry-Sinus Rhythm with frequent PVC's. Jettie Booze NP called and notified about PVC's. Mr Stoffers did drink coffee this morning. Junie Panning said Mr Kutzer is okay to proceed with exercise at cardiac rehab. Asymptomatic.Will continue to monitor the patient throughout  the program.Maria Venetia Maxon, RN,BSN 05/06/2016 4:06 PM

## 2016-05-06 NOTE — Progress Notes (Signed)
Cardiac Rehab Medication Review by a Pharmacist  Does the patient  feel that his/her medications are working for him/her?  yes  Has the patient been experiencing any side effects to the medications prescribed?  yes  Does the patient measure his/her own blood pressure or blood glucose at home?  no   Does the patient have any problems obtaining medications due to transportation or finances?   no  Understanding of regimen: good Understanding of indications: good Potential of compliance: good    Pharmacist comments: Pt presents in fair spirits, ambulating without assistance or difficulty. Reviewed medications, side effects with patient. Pt specifically asks if he will be on brillinta and atorvastatin indefinitely. Informed pt that this is a discussion to have with his physicians. Reviewed mechanism and benefits of therapy for all medications, including ticagrelor and atorvastatin.    Carlean Jews, Pharm.D. PGY1 Pharmacy Resident 9/21/20179:20 AM Pager [phone]

## 2016-05-10 ENCOUNTER — Encounter (HOSPITAL_COMMUNITY)
Admission: RE | Admit: 2016-05-10 | Discharge: 2016-05-10 | Disposition: A | Discharge status: Home / Self Care | Payer: Medicare Other | Source: Ambulatory Visit | Attending: Cardiology | Admitting: Cardiology

## 2016-05-10 ENCOUNTER — Encounter (HOSPITAL_COMMUNITY): Payer: Self-pay

## 2016-05-10 DIAGNOSIS — Z955 Presence of coronary angioplasty implant and graft: Secondary | ICD-10-CM

## 2016-05-10 NOTE — Progress Notes (Signed)
Daily Session Note  Patient Details  Name: Douglas Lane MRN: [id number] Date of Birth: [birth year] Referring Provider:   Flowsheet Row CARDIAC REHAB PHASE II ORIENTATION from 05/06/2016 in [facility]  Referring Provider  Fransico Him MD      Encounter Date: 05/10/2016  Check In:     Session Check In - 05/10/16 1424      Check-In   Location [facility]   Staff Present Luetta Nutting Fair, MS, ACSM RCEP, Exercise Physiologist;Mohit Zirbes, RN, Marga Melnick, RN, BSN   Supervising physician immediately available to respond to emergencies [facility] immediately available   Physician(s) Dr. Dyann Kief   Medication changes reported     No   Fall or balance concerns reported    No   Warm-up and Cool-down Performed as group-led instruction   Resistance Training Performed Yes   VAD Patient? No     Pain Assessment   Currently in Pain? No/denies      Capillary Blood Glucose: No results found for this or any previous visit (from the past 24 hour(s)).   Goals Met:  Exercise tolerated well  Goals Unmet:  Not Applicable  Comments: Pt started cardiac rehab today.  Pt tolerated light exercise without difficulty. VSS, telemetry-sinus rhythm, frequent unifocal PVC,  asymptomatic.  Medication list reconciled. Pt denies barriers to medicaiton compliance.  PSYCHOSOCIAL ASSESSMENT:  PHQ-1. Pt does admit to mild depression sometimes.  Easy to overcome.  Pt exhibits positive coping skills, hopeful outlook with supportive family. No psychosocial needs identified at this time, no psychosocial interventions necessary.    Pt enjoys watching sporting events and reading.  Pt goals for cardiac rehab are to increase cardiac health to be cleared for knee replacement surgery and to learn better nutritional habits with weight loss. Pt encouraged to participate in exercise, risk factor reduction education classes and nutrition education classes.  Pt also cautioned  about doing activities that increase arthritic knee pain. Pt will be moved from nustep to arm crank due to knee pain.    Pt oriented to exercise equipment and routine.    Understanding verbalized.   Dr. Fransico Him is Medical Director for Cardiac Rehab at [facility].

## 2016-05-12 ENCOUNTER — Encounter (HOSPITAL_COMMUNITY)
Admission: RE | Admit: 2016-05-12 | Discharge: 2016-05-12 | Disposition: A | Discharge status: Home / Self Care | Payer: Medicare Other | Source: Ambulatory Visit | Attending: Cardiology | Admitting: Cardiology

## 2016-05-12 DIAGNOSIS — Z955 Presence of coronary angioplasty implant and graft: Secondary | ICD-10-CM

## 2016-05-14 ENCOUNTER — Encounter (HOSPITAL_COMMUNITY)
Admission: RE | Admit: 2016-05-14 | Discharge: 2016-05-14 | Disposition: A | Discharge status: Home / Self Care | Payer: Medicare Other | Source: Ambulatory Visit | Attending: Cardiology | Admitting: Cardiology

## 2016-05-14 DIAGNOSIS — Z955 Presence of coronary angioplasty implant and graft: Secondary | ICD-10-CM | POA: Diagnosis not present

## 2016-05-17 ENCOUNTER — Encounter (HOSPITAL_COMMUNITY)
Admission: RE | Admit: 2016-05-17 | Discharge: 2016-05-17 | Disposition: A | Discharge status: Home / Self Care | Payer: Medicare Other | Source: Ambulatory Visit | Attending: Cardiology | Admitting: Cardiology

## 2016-05-17 DIAGNOSIS — Z955 Presence of coronary angioplasty implant and graft: Secondary | ICD-10-CM | POA: Insufficient documentation

## 2016-05-19 ENCOUNTER — Telehealth (HOSPITAL_COMMUNITY): Payer: Self-pay | Admitting: Family Medicine

## 2016-05-19 ENCOUNTER — Encounter (HOSPITAL_COMMUNITY): Payer: Medicare Other

## 2016-05-21 ENCOUNTER — Encounter (HOSPITAL_COMMUNITY)
Admission: RE | Admit: 2016-05-21 | Discharge: 2016-05-21 | Disposition: A | Discharge status: Home / Self Care | Payer: Medicare Other | Source: Ambulatory Visit | Attending: Cardiology | Admitting: Cardiology

## 2016-05-21 ENCOUNTER — Other Ambulatory Visit (INDEPENDENT_AMBULATORY_CARE_PROVIDER_SITE_OTHER): Payer: Medicare Other

## 2016-05-21 ENCOUNTER — Encounter (HOSPITAL_COMMUNITY): Payer: Medicare Other

## 2016-05-21 DIAGNOSIS — Z955 Presence of coronary angioplasty implant and graft: Secondary | ICD-10-CM

## 2016-05-21 DIAGNOSIS — E785 Hyperlipidemia, unspecified: Secondary | ICD-10-CM | POA: Diagnosis not present

## 2016-05-21 LAB — LIPID PANEL
CHOL/HDL RATIO: 2.7 ratio (ref <=5.0)
CHOLESTEROL: 117 mg/dL — AB (ref 125–200)
HDL: 44 mg/dL (ref >=40)
LDL Cholesterol: 57 mg/dL (ref <130)
Triglycerides: 82 mg/dL (ref <150)
VLDL: 16 mg/dL (ref <30)

## 2016-05-21 LAB — HEPATIC FUNCTION PANEL
ALBUMIN: 4.2 g/dL (ref 3.6–5.1)
ALK PHOS: 78 U/L (ref 40–115)
ALT: 22 U/L (ref 9–46)
AST: 28 U/L (ref 10–35)
BILIRUBIN TOTAL: 2.1 mg/dL — AB (ref 0.2–1.2)
Bilirubin, Direct: 0.4 mg/dL — ABNORMAL HIGH (ref <=0.2)
Indirect Bilirubin: 1.7 mg/dL — ABNORMAL HIGH (ref 0.2–1.2)
TOTAL PROTEIN: 6.3 g/dL (ref 6.1–8.1)

## 2016-05-24 ENCOUNTER — Encounter (HOSPITAL_COMMUNITY)
Admission: RE | Admit: 2016-05-24 | Discharge: 2016-05-24 | Disposition: A | Discharge status: Home / Self Care | Payer: Medicare Other | Source: Ambulatory Visit | Attending: Cardiology | Admitting: Cardiology

## 2016-05-24 DIAGNOSIS — Z955 Presence of coronary angioplasty implant and graft: Secondary | ICD-10-CM | POA: Diagnosis not present

## 2016-05-26 ENCOUNTER — Encounter (HOSPITAL_COMMUNITY)
Admission: RE | Admit: 2016-05-26 | Discharge: 2016-05-26 | Disposition: A | Discharge status: Home / Self Care | Payer: Medicare Other | Source: Ambulatory Visit | Attending: Cardiology | Admitting: Cardiology

## 2016-05-26 DIAGNOSIS — Z955 Presence of coronary angioplasty implant and graft: Secondary | ICD-10-CM

## 2016-05-26 NOTE — Progress Notes (Signed)
Cardiac Individual Treatment Plan  Patient Details  Name: Douglas Lane MRN: [id number] Date of Birth: 07-27-1946 Referring Provider:   Flowsheet Row CARDIAC REHAB PHASE II ORIENTATION from 05/06/2016 in [place]  Referring Provider  Fransico Him MD      Initial Encounter Date:  [place] PHASE II ORIENTATION from 05/06/2016 in [place]  Date  05/06/16  Referring Provider  Fransico Him MD      Visit Diagnosis: 03/30/16 Status post coronary artery stent placement  Patient's Home Medications on Admission:  Current Outpatient Prescriptions:  .  acetaminophen (TYLENOL) 500 MG tablet, Take 1,000 mg by mouth every 8 (eight) hours as needed., Disp: , Rfl:  .  aspirin 81 MG tablet, Take 81 mg by mouth daily., Disp: , Rfl:  .  atorvastatin (LIPITOR) 80 MG tablet, Take 1 tablet (80 mg total) by mouth every evening., Disp: 30 tablet, Rfl: 6 .  clobetasol ointment (TEMOVATE) 7.32 %, Apply 1 application topically as needed (skin irritation). Daily, Disp: , Rfl: 1 .  desonide (DESOWEN) 0.05 % cream, Apply 1 application topically as needed., Disp: , Rfl:  .  diphenhydrAMINE (BENADRYL) 2 % cream, Apply topically as needed for itching., Disp: , Rfl:  .  HUMIRA PEN 40 MG/0.8ML PNKT, Inject 40 mg into the skin every 21 ( twenty-one) days. , Disp: , Rfl:  .  levETIRAcetam (KEPPRA) 750 MG tablet, Take 750 mg by mouth 2 (two) times daily., Disp: , Rfl:  .  nitroGLYCERIN (NITROSTAT) 0.4 MG SL tablet, Place 1 tablet (0.4 mg total) under the tongue every 5 (five) minutes as needed for chest pain (up to 3 doses)., Disp: 25 tablet, Rfl: 3 .  PROCTOZONE-HC 2.5 % rectal cream, Apply 1 application topically daily as needed for hemorrhoids or itching. , Disp: , Rfl:  .  ticagrelor (BRILINTA) 90 MG TABS tablet, Take 1 tablet (90 mg total) by mouth 2 (two) times daily., Disp: 180 tablet, Rfl: 3  Past Medical History: Past  Medical History:  Diagnosis Date  . Aortic stenosis    a. moderate by echo 03/2016; based on cath 03/2016 it is suspected that much of his gradient is subvalvular.  . Arthritis    "pain in my knees" (03/30/2016)  . CAD S/P percutaneous coronary angioplasty 03/31/2016   a. CP/USA -> LHC 03/31/16: 90% prox-mid RCA s/p DES, 40% prox-mid LAD, 25% D2, 25% prox-mid Cx, elevated LVEDP 33.  . Chronic diastolic CHF (congestive heart failure) ([place]) 03/31/2016  . Chronic lower back pain   . Dyslipidemia   . Heart murmur    "since I was a kid" (03/30/2016)  . LVH (left ventricular hypertrophy)    a. severe basal septal hypertrophy by echo 03/2016.  . Mitral regurgitation    a. mild by echo 03/2016.  Marland Kitchen Psoriasis   . Rheumatic fever    as a child  . 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 Sinus bradycardia - baseline HR 50s at times.   . Sleep apnea   . 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 Thrombosed hemorrhoids     Tobacco Use: History  Smoking Status  . Never Smoker  Smokeless Tobacco  . Never Used    Labs: Recent Review Flowsheet Data    Labs for ITP Cardiac and Pulmonary Rehab Latest Ref Rng & Units 08/09/2011 03/30/2016 03/30/2016 04/07/2016 05/21/2016  Cholestrol 125 - 200 mg/dL - - - 197 117(L)   LDLCALC <130 mg/dL - - - 134(H) 57   HDL >=40 mg/dL - - - 37(L) 44   Trlycerides <150 mg/dL - - - 131 82   PHART 7.350 - 7.450 - 7.398 - - -   PCO2ART 35.0 - 45.0 mmHg - 44.1 - - -   HCO3 20.0 - 24.0 mEq/L - 27.2(H) 25.1(H) - -   TCO2 0 - 100 mmol/L 25 29 27  - -   ACIDBASEDEF 0.0 - 2.0 mmol/L - - 1.0 - -   O2SAT % - 91.0 67.0 - -      Capillary Blood Glucose: Lab Results  Component Value Date   GLUCAP 123 (H) 08/09/2011     Exercise Target Goals:    Exercise Program Goal: Individual exercise prescription set with THRR, safety & activity barriers. Participant demonstrates ability to understand and report RPE using BORG  scale, to self-measure pulse accurately, and to acknowledge the importance of the exercise prescription.  Exercise Prescription Goal: Starting with aerobic activity 30 plus minutes a day, 3 days per week for initial exercise prescription. Provide home exercise prescription and guidelines that participant acknowledges understanding prior to discharge.  Activity Barriers & Risk Stratification:     Activity Barriers & Cardiac Risk Stratification - 05/06/16 1215      Activity Barriers & Cardiac Risk Stratification   Cardiac Risk Stratification High      6 Minute Walk:     6 Minute Walk    Row Name 05/06/16 1403         6 Minute Walk   Phase Initial     Distance 1662 feet     Walk Time 6 minutes     # of Rest Breaks 0     MPH 3.14     METS 3.43     RPE 13     VO2 Peak 12.02     Symptoms No     Resting HR 76 bpm     Resting BP 142/80     Max Ex. HR 106 bpm     Max Ex. BP 136/80     2 Minute Post BP 140/76  5 minutes later 126/86        Initial Exercise Prescription:     Initial Exercise Prescription - 05/06/16 1400      Date of Initial Exercise RX and Referring Provider   Date 05/06/16   Referring Provider Fransico Him MD     Treadmill   MPH 2.5   Grade 1   Minutes 10   METs 3.43     Bike   Level 0.08   Minutes 10   METs 2.89     NuStep   Level 3   Minutes 10   METs 2     Prescription Details   Frequency (times per week) 3   Duration Progress to 30 minutes of continuous aerobic without signs/symptoms of physical distress     Intensity   THRR 40-80% of Max Heartrate 60-119   Ratings of Perceived Exertion 11-13   Perceived Dyspnea 0-4     Progression   Progression Continue to progress workloads to maintain intensity without signs/symptoms of physical distress.     Resistance Training   Training Prescription Yes   Weight 2lbs   Reps 10-12      Perform Capillary Blood Glucose checks as needed.  Exercise Prescription Changes:       Exercise Prescription Changes  Row Name 05/24/16 1700             Exercise Review   Progression Yes         Response to Exercise   Blood Pressure (Admit) 112/78       Blood Pressure (Exercise) 130/70       Blood Pressure (Exit) 130/70       Heart Rate (Admit) 85 bpm       Heart Rate (Exercise) 106 bpm       Heart Rate (Exit) 85 bpm       Rating of Perceived Exertion (Exercise) 13         Resistance Training   Training Prescription Yes       Weight 2lbs       Reps 10-12         Treadmill   MPH 2.5       Grade 1       Minutes 10       METs 3.43         Bike   Level 0.08       Minutes 10       METs 2.89         NuStep   Level 3       Minutes 10       METs 2          Exercise Comments:      Exercise Comments    Row Name 05/24/16 1701           Exercise Comments Reviewed METs and goals. Pt is tolerating exercise well; will continue to monitor exercise progression.          Discharge Exercise Prescription (Final Exercise Prescription Changes):     Exercise Prescription Changes - 05/24/16 1700      Exercise Review   Progression Yes     Response to Exercise   Blood Pressure (Admit) 112/78   Blood Pressure (Exercise) 130/70   Blood Pressure (Exit) 130/70   Heart Rate (Admit) 85 bpm   Heart Rate (Exercise) 106 bpm   Heart Rate (Exit) 85 bpm   Rating of Perceived Exertion (Exercise) 13     Resistance Training   Training Prescription Yes   Weight 2lbs   Reps 10-12     Treadmill   MPH 2.5   Grade 1   Minutes 10   METs 3.43     Bike   Level 0.08   Minutes 10   METs 2.89     NuStep   Level 3   Minutes 10   METs 2      Nutrition:  Target Goals: Understanding of nutrition guidelines, daily intake of sodium <1554m, cholesterol <2063m calories 30% from fat and 7% or less from saturated fats, daily to have 5 or more servings of fruits and vegetables.  Biometrics:     Pre Biometrics - 05/06/16 1212      Pre Biometrics   Waist  Circumference 40.5 inches   Hip Circumference 39.75 inches   Waist to Hip Ratio 1.02 %   Triceps Skinfold 16 mm   % Body Fat 29.1 %   Grip Strength 42 kg   Flexibility 12 in   Single Leg Stand 30 seconds       Nutrition Therapy Plan and Nutrition Goals:   Nutrition Discharge: Nutrition Scores:   Nutrition Goals Re-Evaluation:   Psychosocial: Target Goals: Acknowledge presence or absence of depression, maximize coping skills, provide positive support system.  Participant is able to verbalize types and ability to use techniques and skills needed for reducing stress and depression.  Initial Review & Psychosocial Screening:     Initial Psych Review & Screening - 05/06/16 [place]? Yes     Barriers   Psychosocial barriers to participate in program There are no identifiable barriers or psychosocial needs.     Screening Interventions   Interventions Encouraged to exercise      Quality of Life Scores:     Quality of Life - 05/06/16 1402      Quality of Life Scores   Health/Function Pre 18.47 %   Socioeconomic Pre 22.64 %   Psych/Spiritual Pre 21.43 %   Family Pre 22.8 %   GLOBAL Pre 20.57 %      PHQ-9: Recent Review Flowsheet Data    Depression screen [facility] 2/9 05/10/2016   Decreased Interest 0   Down, Depressed, Hopeless 1   PHQ - 2 Score 1      Psychosocial Evaluation and Intervention:     Psychosocial Evaluation - 05/10/16 1424      Psychosocial Evaluation & Interventions   Interventions Stress management education;Relaxation education;Encouraged to exercise with the program and follow exercise prescription   Comments no psychosocial needs identified, no intervention necessary    Continued Psychosocial Services Needed No      Psychosocial Re-Evaluation:     Psychosocial Re-Evaluation    [place] Name 05/26/16 1721             Psychosocial Re-Evaluation   Interventions Encouraged to attend Cardiac  Rehabilitation for the exercise       Comments no psychosocial needs identified, no intervention necessary.  pt exercise is limited by knee pain. pt encouraged to notify staff if assigned equipment/exercise worsens knee discomfort and to avoid activities that increase pain.         Continued Psychosocial Services Needed No          Vocational Rehabilitation: Provide vocational rehab assistance to qualifying candidates.   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 Participant will state understanding/return demonstration of topics presented.  Learning Barriers/Preferences:     Learning Barriers/Preferences - 05/06/16 1409      Learning Barriers/Preferences   Learning Barriers Sight      Education Topics: Count Your Pulse:  -Group instruction provided by verbal instruction, demonstration, patient participation and written materials to support subject.  Instructors address importance of being able to find your pulse and how to count your pulse when at home without a heart monitor.  Patients get hands on experience counting their pulse with staff help and individually.   Heart Attack, Angina, and Risk Factor Modification:  -Group instruction provided by verbal instruction, video, and written materials to support subject.  Instructors address signs and symptoms of angina and heart attacks.    Also discuss risk factors for heart disease and how to make changes to improve heart health risk factors.   Functional Fitness:  -Group instruction provided by verbal instruction, demonstration, patient participation, and written materials to support subject.  Instructors address safety measures  for doing things around  the house.  Discuss how to get up and down off the floor, how to pick things up properly, how to safely get out of a chair without assistance, and balance training.   Meditation and Mindfulness:  -Group instruction provided by verbal instruction, patient participation, and written materials to support subject.  Instructor addresses importance of mindfulness and meditation practice to help reduce stress and improve awareness.  Instructor also leads participants through a meditation exercise.    Stretching for Flexibility and Mobility:  -Group instruction provided by verbal instruction, patient participation, and written materials to support subject.  Instructors lead participants through series of stretches that are designed to increase flexibility thus improving mobility.  These stretches are additional exercise for major muscle groups that are typically performed during regular warm up and cool down.   Hands Only CPR Anytime:  -Group instruction provided by verbal instruction, video, patient participation and written materials to support subject.  Instructors co-teach with AHA video for hands only CPR.  Participants get hands on experience with mannequins.   Nutrition I class: Heart Healthy Eating:  -Group instruction provided by PowerPoint slides, verbal discussion, and written materials to support subject matter. The instructor gives an explanation and review of the Therapeutic Lifestyle Changes diet recommendations, which includes a discussion on lipid goals, dietary fat, sodium, fiber, plant stanol/sterol esters, sugar, and the components of a well-balanced, healthy diet.   Nutrition II class: Lifestyle Skills:  -Group instruction provided by PowerPoint slides, verbal discussion, and written materials to support subject matter. The instructor gives an explanation and review of label reading, grocery shopping for heart health, heart healthy recipe modifications, and  ways to make healthier choices when eating out.   Diabetes Question & Answer:  -Group instruction provided by PowerPoint slides, verbal discussion, and written materials to support subject matter. The instructor gives an explanation and review of diabetes co-morbidities, pre- and post-prandial blood glucose goals, pre-exercise blood glucose goals, signs, symptoms, and treatment of hypoglycemia and hyperglycemia, and foot care basics.   Diabetes Blitz:  -Group instruction provided by PowerPoint slides, verbal discussion, and written materials to support subject matter. The instructor gives an explanation and review of the physiology behind type 1 and type 2 diabetes, diabetes medications and rational behind using different medications, pre- and post-prandial blood glucose recommendations and Hemoglobin A1c goals, diabetes diet, and exercise including blood glucose guidelines for exercising safely.    Portion Distortion:  -Group instruction provided by PowerPoint slides, verbal discussion, written materials, and food models to support subject matter. The instructor gives an explanation of serving size versus portion size, changes in portions sizes over the last 20 years, and what consists of a serving from each food group.   Stress Management:  -Group instruction provided by verbal instruction, video, and written materials to support subject matter.  Instructors review role of stress in heart disease and how to cope with stress positively.   Flowsheet Row CARDIAC REHAB PHASE II EXERCISE from 05/26/2016 in [place]  Date  05/12/16  Instruction Review Code  2- meets goals/outcomes      Exercising on Your Own:  -Group instruction provided by verbal instruction, power point, and written materials to support subject.  Instructors discuss benefits of exercise, components of exercise, frequency and intensity of exercise, and end points for exercise.  Also discuss use  of nitroglycerin and activating EMS.  Review options of places to exercise outside of rehab.  Review guidelines for sex with heart disease.  Cardiac Drugs I:  -Group instruction provided by verbal instruction and written materials to support subject.  Instructor reviews cardiac drug classes: antiplatelets, anticoagulants, beta blockers, and statins.  Instructor discusses reasons, side effects, and lifestyle considerations for each drug class. Flowsheet Row CARDIAC REHAB PHASE II EXERCISE from 05/26/2016 in [place]  Date  05/26/16  Instruction Review Code  2- meets goals/outcomes      Cardiac Drugs II:  -Group instruction provided by verbal instruction and written materials to support subject.  Instructor reviews cardiac drug classes: angiotensin converting enzyme inhibitors (ACE-I), angiotensin II receptor blockers (ARBs), nitrates, and calcium channel blockers.  Instructor discusses reasons, side effects, and lifestyle considerations for each drug class.   Anatomy and Physiology of the Circulatory System:  -Group instruction provided by verbal instruction, video, and written materials to support subject.  Reviews functional anatomy of heart, how it relates to various diagnoses, and what role the heart plays in the overall system.   Knowledge Questionnaire Score:     Knowledge Questionnaire Score - 05/06/16 1211      Knowledge Questionnaire Score   Pre Score 18/24      Core Components/Risk Factors/Patient Goals at Admission:     Personal Goals and Risk Factors at Admission - 05/06/16 1410      Core Components/Risk Factors/Patient Goals on Admission   Lipids Yes   Intervention Provide education and support for participant on nutrition & aerobic/resistive exercise along with prescribed medications to achieve LDL <55m, HDL >422m   Expected Outcomes Short Term: Participant states understanding of desired cholesterol values and is compliant with  medications prescribed. Participant is following exercise prescription and nutrition guidelines.;Long Term: Cholesterol controlled with medications as prescribed, with individualized exercise RX and with personalized nutrition plan. Value goals: LDL < 7032mHDL > 40 mg.   Stress Yes   Intervention Offer individual and/or small group education and counseling on adjustment to heart disease, stress management and health-related lifestyle change. Teach and support self-help strategies.;Refer participants experiencing significant psychosocial distress to appropriate mental health specialists for further evaluation and treatment. When possible, include family members and significant others in education/counseling sessions.   Expected Outcomes Short Term: Participant demonstrates changes in health-related behavior, relaxation and other stress management skills, ability to obtain effective social support, and compliance with psychotropic medications if prescribed.;Long Term: Emotional wellbeing is indicated by absence of clinically significant psychosocial distress or social isolation.      Core Components/Risk Factors/Patient Goals Review:      Goals and Risk Factor Review    Row Name 05/24/16 1702 05/26/16 1657           Core Components/Risk Factors/Patient Goals Review   Personal Goals Review Other Other      Review Pt will be out of town for the rest of the week. Discussed home exercise and staying on track with nutrtion  Pt will be out of town for the rest of the week. Discussed home exercise and staying on track with nutrtion. Pt does feel that the program is beneficial and noticed an increase in exercise tolerance      Expected Outcomes Pt will be compliant with home exercise plan while he is out of town Pt will be compliant with home exercise plan while he is out of town. Pt will also increase overall exercise tolerance and aerobic fitness.         Core Components/Risk Factors/Patient Goals at  Discharge (Final Review):      Goals  and Risk Factor Review - 05/26/16 1657      Core Components/Risk Factors/Patient Goals Review   Personal Goals Review Other   Review Pt will be out of town for the rest of the week. Discussed home exercise and staying on track with nutrtion. Pt does feel that the program is beneficial and noticed an increase in exercise tolerance   Expected Outcomes Pt will be compliant with home exercise plan while he is out of town. Pt will also increase overall exercise tolerance and aerobic fitness.      ITP Comments:     ITP Comments    Row Name 05/06/16 0933 05/21/16 1635         ITP Comments Dr. Loreta Ave, Medical Director attended hypertension education lecture, goals met          Comments: Pt is making expected progress toward personal goals after completing 5 sessions. Recommend continued exercise and life style modification education including  stress management and relaxation techniques to decrease cardiac risk profile.

## 2016-05-28 ENCOUNTER — Encounter (HOSPITAL_COMMUNITY): Payer: Medicare Other

## 2016-05-31 ENCOUNTER — Encounter (HOSPITAL_COMMUNITY): Payer: Medicare Other

## 2016-06-02 ENCOUNTER — Encounter (HOSPITAL_COMMUNITY): Payer: Medicare Other

## 2016-06-04 ENCOUNTER — Encounter (HOSPITAL_COMMUNITY): Payer: Medicare Other

## 2016-06-04 ENCOUNTER — Other Ambulatory Visit: Payer: Self-pay | Admitting: Family Medicine

## 2016-06-04 ENCOUNTER — Encounter (HOSPITAL_COMMUNITY)
Admission: RE | Admit: 2016-06-04 | Discharge: 2016-06-04 | Disposition: A | Discharge status: Home / Self Care | Payer: Medicare Other | Source: Ambulatory Visit | Attending: Cardiology | Admitting: Cardiology

## 2016-06-04 DIAGNOSIS — Z955 Presence of coronary angioplasty implant and graft: Secondary | ICD-10-CM

## 2016-06-04 DIAGNOSIS — R945 Abnormal results of liver function studies: Secondary | ICD-10-CM

## 2016-06-07 ENCOUNTER — Encounter (HOSPITAL_COMMUNITY)
Admission: RE | Admit: 2016-06-07 | Discharge: 2016-06-07 | Disposition: A | Discharge status: Home / Self Care | Payer: Medicare Other | Source: Ambulatory Visit | Attending: Cardiology | Admitting: Cardiology

## 2016-06-07 DIAGNOSIS — Z955 Presence of coronary angioplasty implant and graft: Secondary | ICD-10-CM

## 2016-06-09 ENCOUNTER — Encounter (HOSPITAL_COMMUNITY)
Admission: RE | Admit: 2016-06-09 | Discharge: 2016-06-09 | Disposition: A | Discharge status: Home / Self Care | Payer: Medicare Other | Source: Ambulatory Visit | Attending: Cardiology | Admitting: Cardiology

## 2016-06-09 DIAGNOSIS — Z955 Presence of coronary angioplasty implant and graft: Secondary | ICD-10-CM

## 2016-06-11 ENCOUNTER — Encounter (HOSPITAL_COMMUNITY): Admission: RE | Admit: 2016-06-11 | Payer: Medicare Other | Source: Ambulatory Visit

## 2016-06-14 ENCOUNTER — Encounter (HOSPITAL_COMMUNITY)
Admission: RE | Admit: 2016-06-14 | Discharge: 2016-06-14 | Disposition: A | Discharge status: Home / Self Care | Payer: Medicare Other | Source: Ambulatory Visit | Attending: Cardiology | Admitting: Cardiology

## 2016-06-14 ENCOUNTER — Ambulatory Visit
Admission: RE | Admit: 2016-06-14 | Discharge: 2016-06-14 | Disposition: A | Discharge status: Home / Self Care | Payer: Medicare Other | Source: Ambulatory Visit | Attending: Family Medicine | Admitting: Family Medicine

## 2016-06-14 DIAGNOSIS — Z955 Presence of coronary angioplasty implant and graft: Secondary | ICD-10-CM

## 2016-06-14 DIAGNOSIS — R945 Abnormal results of liver function studies: Secondary | ICD-10-CM

## 2016-06-14 NOTE — Progress Notes (Signed)
Doug[place] Lane 70 y.o. male Nutrition Note Spoke with pt. Nutrition Plan and Nutrition Survey goals reviewed with pt. Pt is following Step 1 of the Therapeutic Lifestyle Changes diet. Pt wants to lose wt and states "doesn't everyone." Pt is not actively trying to lose wt other than "coming to exercise 2-3 days/week." Wt loss tips reviewed. Pt expressed understanding of the information reviewed. Pt aware of nutrition education classes offered and plans on attending nutrition classes.  No results found for: HGBA1C Wt Readings from Last 3 Encounters:  05/06/16 180 lb 1.9 oz (81.7 kg)  04/23/16 178 lb (80.7 kg)  03/31/16 180 lb 12.4 oz (82 kg)   Nutrition Diagnosis ? Food-and nutrition-related knowledge deficit related to lack of exposure to information as related to diagnosis of: ? CVD ?  ? Overweight related to excessive energy intake as evidenced by a BMI of 29.6  Nutrition Intervention ? Pt's individual nutrition plan reviewed with pt. ? Benefits of adopting Therapeutic Lifestyle Changes discussed when [employer] reviewed. ? Pt to attend the Portion Distortion class ? Pt to attend the   ? Nutrition I class                     ? Nutrition II class  ? Pt given handouts for: ? Nutrition I class ? Nutrition II class ? Continue client-centered nutrition education by RD, as part of interdisciplinary care. Goal(s) ? Pt to identify and limit food sources of saturated fat, trans fat, and sodium ? Pt to identify food quantities necessary to achieve weight loss of 6-15 lb at graduation from cardiac rehab.  Monitor and Evaluate progress toward nutrition goal with team. Derek Mound, M.Ed, RD, LDN, CDE 06/14/2016 2:44 PM

## 2016-06-16 ENCOUNTER — Encounter (HOSPITAL_COMMUNITY)
Admission: RE | Admit: 2016-06-16 | Discharge: 2016-06-16 | Disposition: A | Discharge status: Home / Self Care | Payer: Medicare Other | Source: Ambulatory Visit | Attending: Cardiology | Admitting: Cardiology

## 2016-06-16 DIAGNOSIS — Z955 Presence of coronary angioplasty implant and graft: Secondary | ICD-10-CM | POA: Diagnosis not present

## 2016-06-18 ENCOUNTER — Encounter (HOSPITAL_COMMUNITY): Payer: Medicare Other

## 2016-06-21 ENCOUNTER — Encounter (HOSPITAL_COMMUNITY)
Admission: RE | Admit: 2016-06-21 | Discharge: 2016-06-21 | Disposition: A | Discharge status: Home / Self Care | Payer: Medicare Other | Source: Ambulatory Visit | Attending: Cardiology | Admitting: Cardiology

## 2016-06-21 DIAGNOSIS — Z955 Presence of coronary angioplasty implant and graft: Secondary | ICD-10-CM

## 2016-06-22 NOTE — Progress Notes (Signed)
Reviewed home exercise with pt today.  Pt plans to walk and go to [facility] for exercise.  Reviewed THR, pulse, RPE, sign and symptoms, NTG use, and when to call 911 or MD.  Also discussed weather considerations and indoor options.  Pt voiced understanding.    [place] Lane [employer]

## 2016-06-23 ENCOUNTER — Encounter (HOSPITAL_COMMUNITY)
Admission: RE | Admit: 2016-06-23 | Discharge: 2016-06-23 | Disposition: A | Discharge status: Home / Self Care | Payer: Medicare Other | Source: Ambulatory Visit | Attending: Cardiology | Admitting: Cardiology

## 2016-06-23 DIAGNOSIS — Z955 Presence of coronary angioplasty implant and graft: Secondary | ICD-10-CM | POA: Diagnosis not present

## 2016-06-23 NOTE — Progress Notes (Signed)
Cardiac Individual Treatment Plan  Patient Details  Name: Douglas Lane MRN: [id number] Date of Birth: [birth year] Referring Provider:   Flowsheet Row CARDIAC REHAB PHASE II ORIENTATION from 05/06/2016 in [place]  Referring Provider  Fransico Him MD      Initial Encounter Date:  [facility] PHASE II ORIENTATION from 05/06/2016 in [place]  Date  05/06/16  Referring Provider  Fransico Him MD      Visit Diagnosis: 03/30/16 Status post coronary artery stent placement  Patient's Home Medications on Admission:  Current Outpatient Prescriptions:  .  acetaminophen (TYLENOL) 500 MG tablet, Take 1,000 mg by mouth every 8 (eight) hours as needed., Disp: , Rfl:  .  aspirin 81 MG tablet, Take 81 mg by mouth daily., Disp: , Rfl:  .  atorvastatin (LIPITOR) 80 MG tablet, Take 1 tablet (80 mg total) by mouth every evening., Disp: 30 tablet, Rfl: 6 .  clobetasol ointment (TEMOVATE) 4.70 %, Apply 1 application topically as needed (skin irritation). Daily, Disp: , Rfl: 1 .  desonide (DESOWEN) 0.05 % cream, Apply 1 application topically as needed., Disp: , Rfl:  .  diphenhydrAMINE (BENADRYL) 2 % cream, Apply topically as needed for itching., Disp: , Rfl:  .  HUMIRA PEN 40 MG/0.8ML PNKT, Inject 40 mg into the skin every 21 ( twenty-one) days. , Disp: , Rfl:  .  levETIRAcetam (KEPPRA) 750 MG tablet, Take 750 mg by mouth 2 (two) times daily., Disp: , Rfl:  .  nitroGLYCERIN (NITROSTAT) 0.4 MG SL tablet, Place 1 tablet (0.4 mg total) under the tongue every 5 (five) minutes as needed for chest pain (up to 3 doses)., Disp: 25 tablet, Rfl: 3 .  PROCTOZONE-HC 2.5 % rectal cream, Apply 1 application topically daily as needed for hemorrhoids or itching. , Disp: , Rfl:  .  ticagrelor (BRILINTA) 90 MG TABS tablet, Take 1 tablet (90 mg total) by mouth 2 (two) times daily., Disp: 180 tablet, Rfl: 3  Past Medical History: Past  Medical History:  Diagnosis Date  . Aortic stenosis    a. moderate by echo 03/2016; based on cath 03/2016 it is suspected that much of his gradient is subvalvular.  . Arthritis    "pain in my knees" (03/30/2016)  . CAD S/P percutaneous coronary angioplasty 03/31/2016   a. CP/USA -> LHC 03/31/16: 90% prox-mid RCA s/p DES, 40% prox-mid LAD, 25% D2, 25% prox-mid Cx, elevated LVEDP 33.  . Chronic diastolic CHF (congestive heart failure) ([place]) 03/31/2016  . Chronic lower back pain   . Dyslipidemia   . Heart murmur    "since I was a kid" (03/30/2016)  . LVH (left ventricular hypertrophy)    a. severe basal septal hypertrophy by echo 03/2016.  . Mitral regurgitation    a. mild by echo 03/2016.  Marland Kitchen Psoriasis   . Rheumatic fever    as a child  . 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 Sinus bradycardia - baseline HR 50s at times.   . Sleep apnea   . 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 Thrombosed hemorrhoids     Tobacco Use: History  Smoking Status  . Never Smoker  Smokeless Tobacco  . Never Used    Labs: Recent Review Flowsheet Data    Labs for ITP Cardiac and Pulmonary Rehab Latest Ref Rng & Units 08/09/2011 03/30/2016 03/30/2016 04/07/2016 05/21/2016  Cholestrol 125 - 200 mg/dL - - - 197 117(L)   LDLCALC <130 mg/dL - - - 134(H) 57   HDL >=40 mg/dL - - - 37(L) 44   Trlycerides <150 mg/dL - - - 131 82   PHART 7.350 - 7.450 - 7.398 - - -   PCO2ART 35.0 - 45.0 mmHg - 44.1 - - -   HCO3 20.0 - 24.0 mEq/L - 27.2(H) 25.1(H) - -   TCO2 0 - 100 mmol/L 25 29 27  - -   ACIDBASEDEF 0.0 - 2.0 mmol/L - - 1.0 - -   O2SAT % - 91.0 67.0 - -      Capillary Blood Glucose: Lab Results  Component Value Date   GLUCAP 123 (H) 08/09/2011     Exercise Target Goals:    Exercise Program Goal: Individual exercise prescription set with THRR, safety & activity barriers. Participant demonstrates ability to understand and report RPE using BORG  scale, to self-measure pulse accurately, and to acknowledge the importance of the exercise prescription.  Exercise Prescription Goal: Starting with aerobic activity 30 plus minutes a day, 3 days per week for initial exercise prescription. Provide home exercise prescription and guidelines that participant acknowledges understanding prior to discharge.  Activity Barriers & Risk Stratification:     Activity Barriers & Cardiac Risk Stratification - 05/06/16 1215      Activity Barriers & Cardiac Risk Stratification   Cardiac Risk Stratification High      6 Minute Walk:     6 Minute Walk    Row Name 05/06/16 1403         6 Minute Walk   Phase Initial     Distance 1662 feet     Walk Time 6 minutes     # of Rest Breaks 0     MPH 3.14     METS 3.43     RPE 13     VO2 Peak 12.02     Symptoms No     Resting HR 76 bpm     Resting BP 142/80     Max Ex. HR 106 bpm     Max Ex. BP 136/80     2 Minute Post BP 140/76  5 minutes later 126/86        Initial Exercise Prescription:     Initial Exercise Prescription - 05/06/16 1400      Date of Initial Exercise RX and Referring Provider   Date 05/06/16   Referring Provider Fransico Him MD     Treadmill   MPH 2.5   Grade 1   Minutes 10   METs 3.43     Bike   Level 0.08   Minutes 10   METs 2.89     NuStep   Level 3   Minutes 10   METs 2     Prescription Details   Frequency (times per week) 3   Duration Progress to 30 minutes of continuous aerobic without signs/symptoms of physical distress     Intensity   THRR 40-80% of Max Heartrate 60-119   Ratings of Perceived Exertion 11-13   Perceived Dyspnea 0-4     Progression   Progression Continue to progress workloads to maintain intensity without signs/symptoms of physical distress.     Resistance Training   Training Prescription Yes   Weight 2lbs   Reps 10-12      Perform Capillary Blood Glucose checks as needed.  Exercise Prescription Changes:      Exercise Prescription Changes  Row Name 05/24/16 1700 06/22/16 1600           Exercise Review   Progression Yes Yes        Response to Exercise   Blood Pressure (Admit) 112/78 120/82      Blood Pressure (Exercise) 130/70 138/80      Blood Pressure (Exit) 130/70 112/78      Heart Rate (Admit) 85 bpm 78 bpm      Heart Rate (Exercise) 106 bpm 104 bpm      Heart Rate (Exit) 85 bpm 69 bpm      Rating of Perceived Exertion (Exercise) 13 13      Symptoms  - none      Comments  - Reviewed HEP on 06/16/16      Duration  - Progress to 30 minutes of continuous aerobic without signs/symptoms of physical distress      Intensity  - THRR unchanged        Progression   Average METs  - 4        Resistance Training   Training Prescription Yes Yes      Weight 2lbs 5lbs      Reps 10-12 10-12        Treadmill   MPH 2.5 2.5      Grade 1 4      Minutes 10 15      METs 3.43 4.29        Bike   Level 0.08 1.2      Minutes 10 15      METs 2.89 3.8        NuStep   Level 3  -      Minutes 10  -      METs 2  -        Home Exercise Plan   Plans to continue exercise at  [facility] (comment)  Reviewed on 06/16/16 see progress note. Goes to [facility] 3 additional days to program exercise sessions.         Exercise Comments:     Exercise Comments    Row Name 05/24/16 1701 06/22/16 1626         Exercise Comments Reviewed METs and goals. Pt is tolerating exercise well; will continue to monitor exercise progression. Reviewed METs and goals. Pt is tolerating exercise well; will continue to monitor exercise progression.         Discharge Exercise Prescription (Final Exercise Prescription Changes):     Exercise Prescription Changes - 06/22/16 1600      Exercise Review   Progression Yes     Response to Exercise   Blood Pressure (Admit) 120/82   Blood Pressure (Exercise) 138/80   Blood Pressure (Exit) 112/78   Heart Rate (Admit) 78 bpm   Heart  Rate (Exercise) 104 bpm   Heart Rate (Exit) 69 bpm   Rating of Perceived Exertion (Exercise) 13   Symptoms none   Comments Reviewed HEP on 06/16/16   Duration Progress to 30 minutes of continuous aerobic without signs/symptoms of physical distress   Intensity THRR unchanged     Progression   Average METs 4     Resistance Training   Training Prescription Yes   Weight 5lbs   Reps 10-12     Treadmill   MPH 2.5   Grade 4   Minutes 15   METs 4.29     Bike   Level 1.2  Minutes 15   METs 3.8     Home Exercise Plan   Plans to continue exercise at [facility] (comment)  Reviewed on 06/16/16 see progress note. Goes to [employer] Add 3 additional days to program exercise sessions.      Nutrition:  Target Goals: Understanding of nutrition guidelines, daily intake of sodium <1523m, cholesterol <2082m calories 30% from fat and 7% or less from saturated fats, daily to have 5 or more servings of fruits and vegetables.  Biometrics:     Pre Biometrics - 05/06/16 1212      Pre Biometrics   Waist Circumference 40.5 inches   Hip Circumference 39.75 inches   Waist to Hip Ratio 1.02 %   Triceps Skinfold 16 mm   % Body Fat 29.1 %   Grip Strength 42 kg   Flexibility 12 in   Single Leg Stand 30 seconds       Nutrition Therapy Plan and Nutrition Goals:     Nutrition Therapy & Goals - 06/14/16 1443      Nutrition Therapy   Diet Therapeutic Lifestyle Changes     Personal Nutrition Goals   Personal Goal #1 1-2 lb wt loss/week to a wt loss goal of 6-15 lb [facility] regarding individualized specific dietary modifications aiming towards targeted core components such as weight, hypertension, lipid management, diabetes, heart failure and other comorbidities.   Expected Outcomes Short Term Goal: Understand basic principles of dietary content, such as calories, fat, sodium,  cholesterol and nutrients.;Long Term Goal: Adherence to prescribed nutrition plan.      Nutrition Discharge: Nutrition Scores:     Nutrition Assessments - 06/14/16 1443      MEDFICTS Scores   Pre Score 56      Nutrition Goals Re-Evaluation:   Psychosocial: Target Goals: Acknowledge presence or absence of depression, maximize coping skills, provide positive support system. Participant is able to verbalize types and ability to use techniques and skills needed for reducing stress and depression.  Initial Review & Psychosocial Screening:     Initial Psych Review & Screening - 05/06/16 [id number]     Barriers   Psychosocial barriers to participate in program There are no identifiable barriers or psychosocial needs.     Screening Interventions   Interventions Encouraged to exercise      Quality of Life Scores:     Quality of Life - 06/04/16 1643      Quality of Life Scores   Family Pre --  pt feels dissatisfied with his inability to be useful to others due to his health and caring for his mothers aging parents.     GLOBAL Pre --  qol reviewed.  pt cp resolved.  pt dissatisfaction mostly r/t his chronic health, cardiac events and arthritis pain.  cp resolved. pt and his wife are intimate in nonsexual ways,, which is satisfying for them both.       PHQ-9: Recent Review Flowsheet Data    [facility]   Decreased Interest 0   Down, Depressed, Hopeless 1   PHQ - 2 Score 1      Psychosocial Evaluation and Intervention:     Psychosocial Evaluation - 05/10/16 1424      Psychosocial Evaluation & Interventions   Interventions Stress management education;Relaxation education;Encouraged to exercise with the  program and follow exercise prescription   Comments no psychosocial needs identified, no intervention necessary    Continued Psychosocial Services Needed No      Psychosocial Re-Evaluation:      Psychosocial Re-Evaluation    [place] Name 05/26/16 1721 06/22/16 1009           Psychosocial Re-Evaluation   Interventions Encouraged to attend Cardiac Rehabilitation for the exercise Encouraged to attend Cardiac Rehabilitation for the exercise      Comments no psychosocial needs identified, no intervention necessary.  pt exercise is limited by knee pain. pt encouraged to notify staff if assigned equipment/exercise worsens knee discomfort and to avoid activities that increase pain.   no psychosocial needs identified, no intervention necessary.  pt exercise is limited by knee pain. pt encouraged to notify staff if assigned equipment/exercise worsens knee discomfort and to avoid activities that increase pain.        Continued Psychosocial Services Needed No No         Vocational Rehabilitation: Provide vocational rehab assistance to qualifying candidates.   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 Participant will state understanding/return demonstration of topics presented.  Learning Barriers/Preferences:     Learning Barriers/Preferences - 05/06/16 1409      Learning Barriers/Preferences   Learning Barriers Sight      Education Topics: Count Your Pulse:  -Group instruction provided by verbal instruction, demonstration, patient participation and written materials to support subject.  Instructors address importance of being able to find your pulse and how to count your pulse when at home without a heart monitor.  Patients get hands on experience counting their pulse with staff help and individually.   Heart Attack, Angina, and Risk Factor Modification:  -Group instruction  provided by verbal instruction, video, and written materials to support subject.  Instructors address signs and symptoms of angina and heart attacks.    Also discuss risk factors for heart disease and how to make changes to improve heart health risk factors.   Functional Fitness:  -Group instruction provided by verbal instruction, demonstration, patient participation, and written materials to support subject.  Instructors address safety measures for doing things around the house.  Discuss how to get up and down off the floor, how to pick things up properly, how to safely get out of a chair without assistance, and balance training.   Meditation and Mindfulness:  -Group instruction provided by verbal instruction, patient participation, and written materials to support subject.  Instructor addresses importance of mindfulness and meditation practice to help reduce stress and improve awareness.  Instructor also leads participants through a meditation exercise.  Flowsheet Row CARDIAC REHAB PHASE II EXERCISE from 06/23/2016 in [place]  Date  06/09/16  Educator  Jeanella Craze  Instruction Review Code  2- meets goals/outcomes      Stretching for Flexibility and Mobility:  -Group instruction provided by verbal instruction, patient participation, and written materials to support subject.  Instructors lead participants through series of stretches that are designed to increase flexibility thus improving mobility.  These stretches are additional exercise for major muscle groups that are typically performed during regular warm up and cool down.  Hands Only CPR Anytime:  -Group instruction provided by verbal instruction, video, patient participation and written materials to support subject.  Instructors co-teach with AHA video for hands only CPR.  Participants get hands on experience with mannequins.   Nutrition I class: Heart Healthy Eating:  -Group instruction provided by  PowerPoint slides, verbal discussion, and written materials to support subject matter. The instructor gives an explanation and review of the Therapeutic Lifestyle Changes diet recommendations, which includes a discussion on lipid goals, dietary fat, sodium, fiber, plant stanol/sterol esters, sugar, and the components of a well-balanced, healthy diet. Flowsheet Row CARDIAC REHAB PHASE II EXERCISE from 06/23/2016 in [place]  Date  06/14/16  Educator  RD  Instruction Review Code  Not applicable [class handouts given]      Nutrition II class: Lifestyle Skills:  -Group instruction provided by PowerPoint slides, verbal discussion, and written materials to support subject matter. The instructor gives an explanation and review of label reading, grocery shopping for heart health, heart healthy recipe modifications, and ways to make healthier choices when eating out. Flowsheet Row CARDIAC REHAB PHASE II EXERCISE from 06/23/2016 in [place]  Date  06/14/16  Educator  RD  Instruction Review Code  Not applicable [class handouts given]      Diabetes Question & Answer:  -Group instruction provided by PowerPoint slides, verbal discussion, and written materials to support subject matter. The instructor gives an explanation and review of diabetes co-morbidities, pre- and post-prandial blood glucose goals, pre-exercise blood glucose goals, signs, symptoms, and treatment of hypoglycemia and hyperglycemia, and foot care basics.   Diabetes Blitz:  -Group instruction provided by PowerPoint slides, verbal discussion, and written materials to support subject matter. The instructor gives an explanation and review of the physiology behind type 1 and type 2 diabetes, diabetes medications and rational behind using different medications, pre- and post-prandial blood glucose recommendations and Hemoglobin A1c goals, diabetes diet, and exercise including blood  glucose guidelines for exercising safely.    Portion Distortion:  -Group instruction provided by PowerPoint slides, verbal discussion, written materials, and food models to support subject matter. The instructor gives an explanation of serving size versus portion size, changes in portions sizes over the last 20 years, and what consists of a serving from each food group.   Stress Management:  -Group instruction provided by verbal instruction, video, and written materials to support subject matter.  Instructors review role of stress in heart disease and how to cope with stress positively.   Flowsheet Row CARDIAC REHAB PHASE II EXERCISE from 06/23/2016 in [place]  Date  05/12/16  Instruction Review Code  2- meets goals/outcomes      Exercising on Your Own:  -Group instruction provided by verbal instruction, power point, and written materials to support subject.  Instructors discuss benefits of exercise, components of exercise, frequency and intensity of exercise, and end points for exercise.  Also discuss use of nitroglycerin and activating EMS.  Review options of places to exercise outside of rehab.  Review guidelines for sex with heart disease.   Cardiac Drugs I:  -Group instruction provided by verbal instruction and written materials to support subject.  Instructor reviews cardiac drug classes: antiplatelets, anticoagulants, beta blockers, and statins.  Instructor discusses reasons, side effects, and lifestyle considerations for each drug class. Flowsheet Row CARDIAC REHAB PHASE II EXERCISE from 06/23/2016 in [place]  Date  05/26/16  Instruction Review  Code  2- meets goals/outcomes      Cardiac Drugs II:  -Group instruction provided by verbal instruction and written materials to support subject.  Instructor reviews cardiac drug classes: angiotensin converting enzyme inhibitors (ACE-I), angiotensin II receptor blockers  (ARBs), nitrates, and calcium channel blockers.  Instructor discusses reasons, side effects, and lifestyle considerations for each drug class. Flowsheet Row CARDIAC REHAB PHASE II EXERCISE from 06/23/2016 in [place]  Date  06/23/16  Educator  pharmacist  Instruction Review Code  2- meets goals/outcomes      Anatomy and Physiology of the Circulatory System:  -Group instruction provided by verbal instruction, video, and written materials to support subject.  Reviews functional anatomy of heart, how it relates to various diagnoses, and what role the heart plays in the overall system.   Knowledge Questionnaire Score:     Knowledge Questionnaire Score - 05/06/16 1211      Knowledge Questionnaire Score   Pre Score 18/24      Core Components/Risk Factors/Patient Goals at Admission:     Personal Goals and Risk Factors at Admission - 05/06/16 1410      Core Components/Risk Factors/Patient Goals on Admission   Lipids Yes   Intervention Provide education and support for participant on nutrition & aerobic/resistive exercise along with prescribed medications to achieve LDL <34m, HDL >43m   Expected Outcomes Short Term: Participant states understanding of desired cholesterol values and is compliant with medications prescribed. Participant is following exercise prescription and nutrition guidelines.;Long Term: Cholesterol controlled with medications as prescribed, with individualized exercise RX and with personalized nutrition plan. Value goals: LDL < 7060mHDL > 40 mg.   Stress Yes   Intervention Offer individual and/or small group education and counseling on adjustment to heart disease, stress management and health-related lifestyle change. Teach and support self-help strategies.;Refer participants experiencing significant psychosocial distress to appropriate mental health specialists for further evaluation and treatment. When possible, include family members  and significant others in education/counseling sessions.   Expected Outcomes Short Term: Participant demonstrates changes in health-related behavior, relaxation and other stress management skills, ability to obtain effective social support, and compliance with psychotropic medications if prescribed.;Long Term: Emotional wellbeing is indicated by absence of clinically significant psychosocial distress or social isolation.      Core Components/Risk Factors/Patient Goals Review:      Goals and Risk Factor Review    Row Name 05/24/16 1702 05/26/16 1657 06/22/16 1627         Core Components/Risk Factors/Patient Goals Review   Personal Goals Review Other Other Other;Increase Strength and Stamina     Review Pt will be out of town for the rest of the week. Discussed home exercise and staying on track with nutrtion  Pt will be out of town for the rest of the week. Discussed home exercise and staying on track with nutrtion. Pt does feel that the program is beneficial and noticed an increase in exercise tolerance Pt is able to tolerate an increase in exercise workloads and activity levels but feels as though progression is minimal due to knee pain     Expected Outcomes Pt will be compliant with home exercise plan while he is out of town Pt will be compliant with home exercise plan while he is out of town. Pt will also increase overall exercise tolerance and aerobic fitness. Pt will be able to increase exercise tolerance and manage knee pain        Core Components/Risk Factors/Patient Goals at Discharge (  Final Review):      Goals and Risk Factor Review - 06/22/16 1627      Core Components/Risk Factors/Patient Goals Review   Personal Goals Review Other;Increase Strength and Stamina   Review Pt is able to tolerate an increase in exercise workloads and activity levels but feels as though progression is minimal due to knee pain   Expected Outcomes Pt will be able to increase exercise tolerance and manage  knee pain      ITP Comments:     ITP Comments    Row Name 05/06/16 0933 05/21/16 1635         ITP Comments Dr. Loreta Ave, Medical Director attended hypertension education lecture, goals met          Comments: Pt is making expected progress toward personal goals after completing 13 sessions. Recommend continued exercise and life style modification education including  stress management and relaxation techniques to decrease cardiac risk profile.

## 2016-06-24 ENCOUNTER — Encounter: Payer: Self-pay | Admitting: Cardiology

## 2016-06-24 ENCOUNTER — Other Ambulatory Visit: Payer: Self-pay

## 2016-06-24 DIAGNOSIS — E785 Hyperlipidemia, unspecified: Secondary | ICD-10-CM

## 2016-06-24 MED ORDER — ROSUVASTATIN CALCIUM 20 MG PO TABS: 20.0000 mg | ORAL_TABLET | Freq: Every day | ORAL | 11 refills | Status: DC

## 2016-06-25 ENCOUNTER — Encounter (HOSPITAL_COMMUNITY)
Admission: RE | Admit: 2016-06-25 | Discharge: 2016-06-25 | Disposition: A | Discharge status: Home / Self Care | Payer: Medicare Other | Source: Ambulatory Visit | Attending: Cardiology | Admitting: Cardiology

## 2016-06-25 ENCOUNTER — Other Ambulatory Visit: Payer: Medicare Other

## 2016-06-25 DIAGNOSIS — Z955 Presence of coronary angioplasty implant and graft: Secondary | ICD-10-CM

## 2016-06-28 ENCOUNTER — Encounter (HOSPITAL_COMMUNITY)
Admission: RE | Admit: 2016-06-28 | Discharge: 2016-06-28 | Disposition: A | Discharge status: Home / Self Care | Payer: Medicare Other | Source: Ambulatory Visit | Attending: Cardiology | Admitting: Cardiology

## 2016-06-28 DIAGNOSIS — Z955 Presence of coronary angioplasty implant and graft: Secondary | ICD-10-CM | POA: Diagnosis not present

## 2016-06-30 ENCOUNTER — Encounter (HOSPITAL_COMMUNITY)
Admission: RE | Admit: 2016-06-30 | Discharge: 2016-06-30 | Disposition: A | Discharge status: Home / Self Care | Payer: Medicare Other | Source: Ambulatory Visit | Attending: Cardiology | Admitting: Cardiology

## 2016-06-30 DIAGNOSIS — Z955 Presence of coronary angioplasty implant and graft: Secondary | ICD-10-CM | POA: Diagnosis not present

## 2016-07-02 ENCOUNTER — Encounter (HOSPITAL_COMMUNITY)
Admission: RE | Admit: 2016-07-02 | Discharge: 2016-07-02 | Disposition: A | Discharge status: Home / Self Care | Payer: Medicare Other | Source: Ambulatory Visit | Attending: Cardiology | Admitting: Cardiology

## 2016-07-02 DIAGNOSIS — Z955 Presence of coronary angioplasty implant and graft: Secondary | ICD-10-CM | POA: Diagnosis not present

## 2016-07-05 ENCOUNTER — Encounter (HOSPITAL_COMMUNITY)
Admission: RE | Admit: 2016-07-05 | Discharge: 2016-07-05 | Disposition: A | Discharge status: Home / Self Care | Payer: Medicare Other | Source: Ambulatory Visit | Attending: Cardiology | Admitting: Cardiology

## 2016-07-06 ENCOUNTER — Telehealth: Payer: Self-pay

## 2016-07-06 ENCOUNTER — Encounter: Payer: Self-pay | Admitting: Neurology

## 2016-07-06 MED ORDER — LEVETIRACETAM 750 MG PO TABS: 750.0000 mg | ORAL_TABLET | Freq: Two times a day (BID) | ORAL | 3 refills | Status: DC

## 2016-07-06 NOTE — Telephone Encounter (Signed)
RX Keppra sent. Patient taking 750mg  BID per last OV note.

## 2016-07-07 ENCOUNTER — Encounter (HOSPITAL_COMMUNITY): Payer: Medicare Other

## 2016-07-12 ENCOUNTER — Encounter (HOSPITAL_COMMUNITY)
Admission: RE | Admit: 2016-07-12 | Discharge: 2016-07-12 | Disposition: A | Discharge status: Home / Self Care | Payer: Medicare Other | Source: Ambulatory Visit | Attending: Cardiology | Admitting: Cardiology

## 2016-07-12 DIAGNOSIS — Z955 Presence of coronary angioplasty implant and graft: Secondary | ICD-10-CM

## 2016-07-14 ENCOUNTER — Encounter (HOSPITAL_COMMUNITY)
Admission: RE | Admit: 2016-07-14 | Discharge: 2016-07-14 | Disposition: A | Discharge status: Home / Self Care | Payer: Medicare Other | Source: Ambulatory Visit | Attending: Cardiology | Admitting: Cardiology

## 2016-07-14 DIAGNOSIS — Z955 Presence of coronary angioplasty implant and graft: Secondary | ICD-10-CM | POA: Diagnosis not present

## 2016-07-16 ENCOUNTER — Encounter (HOSPITAL_COMMUNITY): Payer: Medicare Other

## 2016-07-19 ENCOUNTER — Encounter (HOSPITAL_COMMUNITY)
Admission: RE | Admit: 2016-07-19 | Discharge: 2016-07-19 | Disposition: A | Discharge status: Home / Self Care | Payer: Medicare Other | Source: Ambulatory Visit | Attending: Cardiology | Admitting: Cardiology

## 2016-07-19 DIAGNOSIS — Z955 Presence of coronary angioplasty implant and graft: Secondary | ICD-10-CM | POA: Diagnosis present

## 2016-07-21 ENCOUNTER — Encounter (HOSPITAL_COMMUNITY)
Admission: RE | Admit: 2016-07-21 | Discharge: 2016-07-21 | Disposition: A | Discharge status: Home / Self Care | Payer: Medicare Other | Source: Ambulatory Visit | Attending: Cardiology | Admitting: Cardiology

## 2016-07-21 VITALS — Ht 65.5 in | Wt 180.1 lb

## 2016-07-21 DIAGNOSIS — Z955 Presence of coronary angioplasty implant and graft: Secondary | ICD-10-CM | POA: Diagnosis not present

## 2016-07-23 ENCOUNTER — Encounter (HOSPITAL_COMMUNITY)
Admission: RE | Admit: 2016-07-23 | Discharge: 2016-07-23 | Disposition: A | Discharge status: Home / Self Care | Payer: Medicare Other | Source: Ambulatory Visit | Attending: Cardiology | Admitting: Cardiology

## 2016-07-23 NOTE — Progress Notes (Signed)
Cardiac Individual Treatment Plan  Patient Details  Name: Douglas Lane MRN: [id number] Date of Birth: 05/17/1946 Referring Provider:   Flowsheet Row CARDIAC REHAB PHASE II ORIENTATION from 05/06/2016 in [place]  Referring Provider  Fransico Him MD      Initial Encounter Date:  [place] from 05/06/2016 in [place]  Date  05/06/16  Referring Provider  Fransico Him MD      Visit Diagnosis: No diagnosis found.  Patient's Home Medications on Admission:  Current Outpatient Prescriptions:  .  acetaminophen (TYLENOL) 500 MG tablet, Take 1,000 mg by mouth every 8 (eight) hours as needed., Disp: , Rfl:  .  aspirin 81 MG tablet, Take 81 mg by mouth daily., Disp: , Rfl:  .  clobetasol ointment (TEMOVATE) 8.46 %, Apply 1 application topically as needed (skin irritation). Daily, Disp: , Rfl: 1 .  desonide (DESOWEN) 0.05 % cream, Apply 1 application topically as needed., Disp: , Rfl:  .  diphenhydrAMINE (BENADRYL) 2 % cream, Apply topically as needed for itching., Disp: , Rfl:  .  HUMIRA PEN 40 MG/0.8ML PNKT, Inject 40 mg into the skin every 21 ( twenty-one) days. , Disp: , Rfl:  .  levETIRAcetam (KEPPRA) 750 MG tablet, Take 1 tablet (750 mg total) by mouth 2 (two) times daily., Disp: 180 tablet, Rfl: 3 .  nitroGLYCERIN (NITROSTAT) 0.4 MG SL tablet, Place 1 tablet (0.4 mg total) under the tongue every 5 (five) minutes as needed for chest pain (up to 3 doses)., Disp: 25 tablet, Rfl: 3 .  PROCTOZONE-HC 2.5 % rectal cream, Apply 1 application topically daily as needed for hemorrhoids or itching. , Disp: , Rfl:  .  rosuvastatin (CRESTOR) 20 MG tablet, Take 1 tablet (20 mg total) by mouth daily., Disp: 30 tablet, Rfl: 11 .  ticagrelor (BRILINTA) 90 MG TABS tablet, Take 1 tablet (90 mg total) by mouth 2 (two) times daily., Disp: 180 tablet, Rfl: 3  Past Medical History: Past Medical History:   Diagnosis Date  . Aortic stenosis    a. moderate by echo 03/2016; based on cath 03/2016 it is suspected that much of his gradient is subvalvular.  . Arthritis    "pain in my knees" (03/30/2016)  . CAD S/P percutaneous coronary angioplasty 03/31/2016   a. CP/[place] -> LHC 03/31/16: 90% prox-mid RCA s/p DES, 40% prox-mid LAD, 25% D2, 25% prox-mid Cx, elevated LVEDP 33.  . Chronic diastolic CHF (congestive heart failure) ([place]) 03/31/2016  . Chronic lower back pain   . Dyslipidemia   . Heart murmur    "since I was a kid" (03/30/2016)  . LVH (left ventricular hypertrophy)    a. severe basal septal hypertrophy by echo 03/2016.  . Mitral regurgitation    a. mild by echo 03/2016.  [place] Psoriasis   . Rheumatic fever    as a child  . 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 Sinus bradycardia - baseline HR 50s at times.   . Sleep apnea   . 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 Thrombosed hemorrhoids     Tobacco Use: History  Smoking Status  . Never Smoker  Smokeless Tobacco  . Never Used    Labs: Recent Review Flowsheet Data    Labs for ITP Cardiac and Pulmonary Rehab Latest Ref Rng & Units 08/09/2011 03/30/2016 03/30/2016 04/07/2016 05/21/2016  Cholestrol 125 - 200 mg/dL - - - 197 117(L)   LDLCALC <130 mg/dL - - - 134(H) 57   HDL >=40 mg/dL - - - 37(L) 44   Trlycerides <150 mg/dL - - - 131 82   PHART 7.350 - 7.450 - 7.398 - - -   PCO2ART 35.0 - 45.0 mmHg - 44.1 - - -   HCO3 20.0 - 24.0 mEq/L - 27.2(H) 25.1(H) - -   TCO2 0 - 100 mmol/L _0 - -   ACIDBASEDEF 0.0 - 2.0 mmol/L - - 1.0 - -   O2SAT % - 91.0 67.0 - -      Capillary Blood Glucose: Lab Results  Component Value Date   GLUCAP 123 (H) 08/09/2011     Exercise Target Goals:    Exercise Program Goal: Individual exercise prescription set with THRR, safety & activity barriers. Participant demonstrates ability to understand and report RPE using BORG scale, to  self-measure pulse accurately, and to acknowledge the importance of the exercise prescription.  Exercise Prescription Goal: Starting with aerobic activity 30 plus minutes a day, 3 days per week for initial exercise prescription. Provide home exercise prescription and guidelines that participant acknowledges understanding prior to discharge.  Activity Barriers & Risk Stratification:     Activity Barriers & Cardiac Risk Stratification - 05/06/16 1215      Activity Barriers & Cardiac Risk Stratification   Cardiac Risk Stratification High      6 Minute Walk:     6 Minute Walk    Row Name 05/06/16 1403         6 Minute Walk   Phase Initial     Distance 1662 feet     Walk Time 6 minutes     # of Rest Breaks 0     MPH 3.14     METS 3.43     RPE 13     VO2 Peak 12.02     Symptoms No     Resting HR 76 bpm     Resting BP 142/80     Max Ex. HR 106 bpm     Max Ex. BP 136/80     2 Minute Post BP 140/76  5 minutes later 126/86        Initial Exercise Prescription:     Initial Exercise Prescription - 05/06/16 1400      Date of Initial Exercise RX and Referring Provider   Date 05/06/16   Referring Provider Fransico Him MD     Treadmill   MPH 2.5   Grade 1   Minutes 10   METs 3.43     Bike   Level 0.08   Minutes 10   METs 2.89     NuStep   Level 3   Minutes 10   METs 2     Prescription Details   Frequency (times per week) 3   Duration Progress to 30 minutes of continuous aerobic without signs/symptoms of physical distress     Intensity   THRR 40-80% of Max Heartrate 60-119   Ratings of Perceived Exertion 11-13   Perceived Dyspnea 0-4     Progression   Progression Continue to progress workloads to maintain intensity without signs/symptoms of physical distress.     Resistance Training   Training Prescription Yes   Weight 2lbs   Reps 10-12      Perform Capillary Blood Glucose checks as needed.  Exercise Prescription Changes:     Exercise  Prescription Changes  Row Name 05/24/16 1700 06/22/16 1600 07/21/16 1000         Exercise Review   Progression Yes Yes Yes       Response to Exercise   Blood Pressure (Admit) 112/78 120/82 140/84     Blood Pressure (Exercise) 130/70 138/80 146/82     Blood Pressure (Exit) 130/70 112/78 102/68     Heart Rate (Admit) 85 bpm 78 bpm 83 bpm     Heart Rate (Exercise) 106 bpm 104 bpm 111 bpm     Heart Rate (Exit) 85 bpm 69 bpm 83 bpm     Rating of Perceived Exertion (Exercise) _0 Symptoms  - none none     Comments  - Reviewed HEP on 06/16/16 Reviewed HEP on 06/16/16     Duration  - Progress to 30 minutes of continuous aerobic without signs/symptoms of physical distress Progress to 30 minutes of continuous aerobic without signs/symptoms of physical distress     Intensity  - THRR unchanged THRR unchanged       Progression   Average METs  - 4 4.1       Resistance Training   Training Prescription Yes Yes Yes     Weight 2lbs 5lbs 6lbs     Reps 10-12 10-12 10-12       Treadmill   MPH 2.5 2.5 2.5     Grade _1 Minutes _2 METs 3.43 4.29 4.29       Bike   Level 0.08 1.2  -     Minutes 10 15  -     METs 2.89 3.8  -       NuStep   Level 3  - 5     Minutes 10  - 15     METs 2  - 4.1       Home Exercise Plan   Plans to continue exercise at  [facility] (comment)  Reviewed on 06/16/16 see progress note. Goes to [employer] (comment)  Reviewed on 06/16/16 see progress note. Goes to [facility] 3 additional days to program exercise sessions. Add 3 additional days to program exercise sessions.        Exercise Comments:     Exercise Comments    Row Name 05/24/16 1701 06/22/16 1626 07/21/16 1055       Exercise Comments Reviewed METs and goals. Pt is tolerating exercise well; will continue to monitor exercise progression. Reviewed METs and goals. Pt is tolerating exercise well; will continue to monitor  exercise progression. Reviewed METs and goals. Pt is tolerating exercise well; will continue to monitor exercise progression.        Discharge Exercise Prescription (Final Exercise Prescription Changes):     Exercise Prescription Changes - 07/21/16 1000      Exercise Review   Progression Yes     Response to Exercise   Blood Pressure (Admit) 140/84   Blood Pressure (Exercise) 146/82   Blood Pressure (Exit) 102/68   Heart Rate (Admit) 83 bpm   Heart Rate (Exercise) 111 bpm   Heart Rate (Exit) 83 bpm   Rating of Perceived Exertion (Exercise) 14   Symptoms none   Comments Reviewed HEP on 06/16/16   Duration Progress to 30 minutes of continuous aerobic without signs/symptoms of physical distress   Intensity THRR unchanged     Progression  Average METs 4.1     Resistance Training   Training Prescription Yes   Weight 6lbs   Reps 10-12     Treadmill   MPH 2.5   Grade 4   Minutes 15   METs 4.29     NuStep   Level 5   Minutes 15   METs 4.1     Home Exercise Plan   Plans to continue exercise at [facility] (comment)  Reviewed on 06/16/16 see progress note. Goes to [employer] Add 3 additional days to program exercise sessions.      Nutrition:  Target Goals: Understanding of nutrition guidelines, daily intake of sodium <1563m, cholesterol <2065m calories 30% from fat and 7% or less from saturated fats, daily to have 5 or more servings of fruits and vegetables.  Biometrics:     Pre Biometrics - 05/06/16 1212      Pre Biometrics   Waist Circumference 40.5 inches   Hip Circumference 39.75 inches   Waist to Hip Ratio 1.02 %   Triceps Skinfold 16 mm   % Body Fat 29.1 %   Grip Strength 42 kg   Flexibility 12 in   Single Leg Stand 30 seconds       Nutrition Therapy Plan and Nutrition Goals:     Nutrition Therapy & Goals - 06/14/16 1443      Nutrition Therapy   Diet Therapeutic Lifestyle Changes     Personal Nutrition Goals   Personal  Goal #1 1-2 lb wt loss/week to a wt loss goal of 6-15 lb [facility] regarding individualized specific dietary modifications aiming towards targeted core components such as weight, hypertension, lipid management, diabetes, heart failure and other comorbidities.   Expected Outcomes Short Term Goal: Understand basic principles of dietary content, such as calories, fat, sodium, cholesterol and nutrients.;Long Term Goal: Adherence to prescribed nutrition plan.      Nutrition Discharge: Nutrition Scores:     Nutrition Assessments - 06/14/16 1443      MEDFICTS Scores   Pre Score 56      Nutrition Goals Re-Evaluation:   Psychosocial: Target Goals: Acknowledge presence or absence of depression, maximize coping skills, provide positive support system. Participant is able to verbalize types and ability to use techniques and skills needed for reducing stress and depression.  Initial Review & Psychosocial Screening:     Initial Psych Review & Screening - 05/06/16 15[place] EstateYes     Barriers   Psychosocial barriers to participate in program There are no identifiable barriers or psychosocial needs.     Screening Interventions   Interventions Encouraged to exercise      Quality of Life Scores:     Quality of Life - 06/04/16 1643      Quality of Life Scores   Family Pre --  pt feels dissatisfied with his inability to be useful to others due to his health and caring for his mothers aging parents.     GLOBAL Pre --  qol reviewed.  pt cp resolved.  pt dissatisfaction mostly r/t his chronic health, cardiac events and arthritis pain.  cp resolved. pt and his wife are intimate in nonsexual ways,, which is satisfying for them both.       PHQ-9: Recent Review Flowsheet Data    [facility]  Decreased Interest 0   Down, Depressed,  Hopeless 1   PHQ - 2 Score 1      Psychosocial Evaluation and Intervention:     Psychosocial Evaluation - 05/10/16 1424      Psychosocial Evaluation & Interventions   Interventions Stress management education;Relaxation education;Encouraged to exercise with the program and follow exercise prescription   Comments no psychosocial needs identified, no intervention necessary    Continued Psychosocial Services Needed No      Psychosocial Re-Evaluation:     Psychosocial Re-Evaluation    [place] Name 05/26/16 1721 06/22/16 1009 07/20/16 1128         Psychosocial Re-Evaluation   Interventions Encouraged to attend Cardiac Rehabilitation for the exercise Encouraged to attend Cardiac Rehabilitation for the exercise Stress management education;Relaxation education;Encouraged to attend Cardiac Rehabilitation for the exercise     Comments no psychosocial needs identified, no intervention necessary.  pt exercise is limited by knee pain. pt encouraged to notify staff if assigned equipment/exercise worsens knee discomfort and to avoid activities that increase pain.   no psychosocial needs identified, no intervention necessary.  pt exercise is limited by knee pain. pt encouraged to notify staff if assigned equipment/exercise worsens knee discomfort and to avoid activities that increase pain.   no psychosocial needs identified, no intervention necessary. pt exercise limited by knee pain, although pt has learned to modify routine to avoid increased pain.      Continued Psychosocial Services Needed No No No        Vocational Rehabilitation: Provide vocational rehab assistance to qualifying candidates.   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 Participant will state understanding/return demonstration of topics presented.  Learning Barriers/Preferences:     Learning Barriers/Preferences - 05/06/16 1409      Learning Barriers/Preferences   Learning Barriers Sight      Education Topics: Count Your Pulse:  -Group instruction provided by verbal instruction, demonstration, patient participation and written materials to support subject.  Instructors address importance of being able to find your pulse and how to count your pulse when at home without a heart monitor.  Patients get hands on experience counting their pulse with staff help and individually.   Heart Attack, Angina, and Risk Factor Modification:  -Group instruction provided by verbal instruction, video, and written materials to support subject.  Instructors address signs and symptoms of angina and heart attacks.    Also discuss risk factors for heart disease and how to make changes to improve heart health risk factors.   Functional Fitness:  -Group instruction provided by verbal instruction, demonstration, patient participation, and written materials to support subject.  Instructors address safety measures for doing things around the house.  Discuss how to get up and down off the floor, how to pick things up properly, how to safely get out of a chair without assistance, and balance training.   Meditation and Mindfulness:  -Group instruction provided by verbal instruction, patient participation, and written materials to support subject.  Instructor addresses importance of mindfulness and meditation practice to help reduce stress and improve awareness.  Instructor also leads participants through a meditation exercise.  Flowsheet Row CARDIAC REHAB PHASE II EXERCISE from  07/19/2016 in [place]  Date  06/09/16  Educator  Jeanella Craze  Instruction Review Code  2- meets goals/outcomes       Stretching for Flexibility and Mobility:  -Group instruction provided by verbal instruction, patient participation, and written materials to support subject.  Instructors lead participants through series of stretches that are designed to increase flexibility thus improving mobility.  These stretches are additional exercise for major muscle groups that are typically performed during regular warm up and cool down.   Hands Only CPR Anytime:  -Group instruction provided by verbal instruction, video, patient participation and written materials to support subject.  Instructors co-teach with AHA video for hands only CPR.  Participants get hands on experience with mannequins.   Nutrition I class: Heart Healthy Eating:  -Group instruction provided by PowerPoint slides, verbal discussion, and written materials to support subject matter. The instructor gives an explanation and review of the Therapeutic Lifestyle Changes diet recommendations, which includes a discussion on lipid goals, dietary fat, sodium, fiber, plant stanol/sterol esters, sugar, and the components of a well-balanced, healthy diet. Flowsheet Row CARDIAC REHAB PHASE II EXERCISE from 07/19/2016 in [place]  Date  07/20/16  Educator  RD  Instruction Review Code  2- meets goals/outcomes      Nutrition II class: Lifestyle Skills:  -Group instruction provided by PowerPoint slides, verbal discussion, and written materials to support subject matter. The instructor gives an explanation and review of label reading, grocery shopping for heart health, heart healthy recipe modifications, and ways to make healthier choices when eating out. Flowsheet Row CARDIAC REHAB PHASE II EXERCISE from 07/19/2016 in [place]  Date  06/29/16  Educator  RD  Instruction Review Code  2- meets goals/outcomes      Diabetes Question & Answer:  -Group instruction provided by PowerPoint slides,  verbal discussion, and written materials to support subject matter. The instructor gives an explanation and review of diabetes co-morbidities, pre- and post-prandial blood glucose goals, pre-exercise blood glucose goals, signs, symptoms, and treatment of hypoglycemia and hyperglycemia, and foot care basics.   Diabetes Blitz:  -Group instruction provided by PowerPoint slides, verbal discussion, and written materials to support subject matter. The instructor gives an explanation and review of the physiology behind type 1 and type 2 diabetes, diabetes medications and rational behind using different medications, pre- and post-prandial blood glucose recommendations and Hemoglobin A1c goals, diabetes diet, and exercise including blood glucose guidelines for exercising safely.    Portion Distortion:  -Group instruction provided by PowerPoint slides, verbal discussion, written materials, and food models to support subject matter. The instructor gives an explanation of serving size versus portion size, changes in portions sizes over the last 20 years, and what consists of a serving from each food group.   Stress Management:  -Group instruction provided by verbal instruction, video, and written materials to support subject matter.  Instructors review role of stress in heart disease and how to cope with stress positively.   Flowsheet Row CARDIAC REHAB PHASE II EXERCISE from 07/19/2016 in [place]  Date  06/30/16  Instruction Review Code  2- meets goals/outcomes      Exercising on Your Own:  -Group instruction provided by verbal instruction, power point, and written materials to support subject.  Instructors discuss benefits of exercise, components of exercise, frequency and intensity of exercise, and end points for exercise.  Also discuss use of nitroglycerin and  activating EMS.  Review options of places to exercise outside of rehab.  Review guidelines for sex with heart  disease.   Cardiac Drugs I:  -Group instruction provided by verbal instruction and written materials to support subject.  Instructor reviews cardiac drug classes: antiplatelets, anticoagulants, beta blockers, and statins.  Instructor discusses reasons, side effects, and lifestyle considerations for each drug class. Flowsheet Row CARDIAC REHAB PHASE II EXERCISE from 07/19/2016 in [place]  Date  05/26/16  Instruction Review Code  2- meets goals/outcomes      Cardiac Drugs II:  -Group instruction provided by verbal instruction and written materials to support subject.  Instructor reviews cardiac drug classes: angiotensin converting enzyme inhibitors (ACE-I), angiotensin II receptor blockers (ARBs), nitrates, and calcium channel blockers.  Instructor discusses reasons, side effects, and lifestyle considerations for each drug class. Flowsheet Row CARDIAC REHAB PHASE II EXERCISE from 07/19/2016 in [place]  Date  06/23/16  Educator  pharmacist  Instruction Review Code  2- meets goals/outcomes      Anatomy and Physiology of the Circulatory System:  -Group instruction provided by verbal instruction, video, and written materials to support subject.  Reviews functional anatomy of heart, how it relates to various diagnoses, and what role the heart plays in the overall system.   Knowledge Questionnaire Score:     Knowledge Questionnaire Score - 05/06/16 1211      Knowledge Questionnaire Score   Pre Score 18/24      Core Components/Risk Factors/Patient Goals at Admission:     Personal Goals and Risk Factors at Admission - 05/06/16 1410      Core Components/Risk Factors/Patient Goals on Admission   Lipids Yes   Intervention Provide education and support for participant on nutrition & aerobic/resistive exercise along with prescribed medications to achieve LDL <70m, HDL >429m   Expected Outcomes Short Term: Participant  states understanding of desired cholesterol values and is compliant with medications prescribed. Participant is following exercise prescription and nutrition guidelines.;Long Term: Cholesterol controlled with medications as prescribed, with individualized exercise RX and with personalized nutrition plan. Value goals: LDL < 7024mHDL > 40 mg.   Stress Yes   Intervention Offer individual and/or small group education and counseling on adjustment to heart disease, stress management and health-related lifestyle change. Teach and support self-help strategies.;Refer participants experiencing significant psychosocial distress to appropriate mental health specialists for further evaluation and treatment. When possible, include family members and significant others in education/counseling sessions.   Expected Outcomes Short Term: Participant demonstrates changes in health-related behavior, relaxation and other stress management skills, ability to obtain effective social support, and compliance with psychotropic medications if prescribed.;Long Term: Emotional wellbeing is indicated by absence of clinically significant psychosocial distress or social isolation.      Core Components/Risk Factors/Patient Goals Review:      Goals and Risk Factor Review    Row Name 05/24/16 1702 05/26/16 1657 06/22/16 1627 07/20/16 0857       Core Components/Risk Factors/Patient Goals Review   Personal Goals Review Other Other Other;Increase Strength and Stamina  -    Review Pt will be out of town for the rest of the week. Discussed home exercise and staying on track with nutrtion  Pt will be out of town for the rest of the week. Discussed home exercise and staying on track with nutrtion. Pt does feel that the program is beneficial and noticed an increase in exercise tolerance Pt is able to tolerate an  increase in exercise workloads and activity levels but feels as though progression is minimal due to knee pain Pt has noticed an  increase in stamina and energy levels with exercise. Pt is expressed being symptom free when exercising, other than B knee pain. Pt is wearing knee braces while exercising and find it helpful.    Expected Outcomes Pt will be compliant with home exercise plan while he is out of town Pt will be compliant with home exercise plan while he is out of town. Pt will also increase overall exercise tolerance and aerobic fitness. Pt will be able to increase exercise tolerance and manage knee pain Pt will be able to increase exercise tolerance and manage knee pain       Core Components/Risk Factors/Patient Goals at Discharge (Final Review):      Goals and Risk Factor Review - 07/20/16 0857      Core Components/Risk Factors/Patient Goals Review   Review Pt has noticed an increase in stamina and energy levels with exercise. Pt is expressed being symptom free when exercising, other than B knee pain. Pt is wearing knee braces while exercising and find it helpful.   Expected Outcomes Pt will be able to increase exercise tolerance and manage knee pain      ITP Comments:     ITP Comments    Row Name 05/06/16 0933 05/21/16 1635         ITP Comments Dr. Loreta Ave, Medical Director attended hypertension education lecture, goals met          Comments: Pt is making expected progress toward personal goals after completing 23 sessions. pt is limited by orthopaedic discomfort.  Recommend continued exercise within his limitations and life style modification education including  stress management and relaxation techniques to decrease cardiac [id number] [id number]

## 2016-07-26 ENCOUNTER — Encounter (HOSPITAL_COMMUNITY): Payer: Medicare Other

## 2016-07-26 ENCOUNTER — Telehealth (HOSPITAL_COMMUNITY): Payer: Self-pay | Admitting: Family Medicine

## 2016-07-28 ENCOUNTER — Encounter (HOSPITAL_COMMUNITY): Payer: Medicare Other

## 2016-07-30 ENCOUNTER — Encounter (HOSPITAL_COMMUNITY): Payer: Medicare Other

## 2016-08-02 ENCOUNTER — Encounter (HOSPITAL_COMMUNITY): Payer: Medicare Other

## 2016-08-04 ENCOUNTER — Encounter (HOSPITAL_COMMUNITY): Payer: Medicare Other

## 2016-08-06 ENCOUNTER — Other Ambulatory Visit: Payer: Medicare Other | Admitting: *Deleted

## 2016-08-06 ENCOUNTER — Encounter (HOSPITAL_COMMUNITY): Admission: RE | Admit: 2016-08-06 | Payer: Medicare Other | Source: Ambulatory Visit

## 2016-08-06 DIAGNOSIS — E785 Hyperlipidemia, unspecified: Secondary | ICD-10-CM

## 2016-08-06 LAB — LIPID PANEL
CHOL/HDL RATIO: 2.6 ratio (ref <5.0)
Cholesterol: 130 mg/dL (ref <200)
HDL: 50 mg/dL (ref >40)
LDL CALC: 64 mg/dL (ref <100)
Triglycerides: 82 mg/dL (ref <150)
VLDL: 16 mg/dL (ref <30)

## 2016-08-06 LAB — ALT: ALT: 19 U/L (ref 9–46)

## 2016-08-10 ENCOUNTER — Encounter (HOSPITAL_COMMUNITY): Payer: Self-pay

## 2016-08-10 NOTE — Progress Notes (Signed)
Cardiac Individual Treatment Plan  Patient Details  Name: Douglas Lane MRN: [id number] Date of Birth: 05-05-1946 Referring Provider:   Flowsheet Row CARDIAC REHAB PHASE II ORIENTATION from 05/06/2016 in [place]  Referring Provider  Fransico Him MD      Initial Encounter Date:  [place] [facility] PHASE II ORIENTATION from 05/06/2016 in [place]  Date  05/06/16  Referring Provider  Fransico Him MD      Visit Diagnosis: 03/30/16 Status post coronary artery stent placement  Patient's Home Medications on Admission:  Current Outpatient Prescriptions:  .  acetaminophen (TYLENOL) 500 MG tablet, Take 1,000 mg by mouth every 8 (eight) hours as needed., Disp: , Rfl:  .  aspirin 81 MG tablet, Take 81 mg by mouth daily., Disp: , Rfl:  .  desonide (DESOWEN) 0.05 % cream, Apply 1 application topically as needed., Disp: , Rfl:  .  diphenhydrAMINE (BENADRYL) 2 % cream, Apply topically as needed for itching., Disp: , Rfl:  .  HUMIRA PEN 40 MG/0.8ML PNKT, Inject 40 mg into the skin every 21 ( twenty-one) days. , Disp: , Rfl:  .  levETIRAcetam (KEPPRA) 750 MG tablet, Take 1 tablet (750 mg total) by mouth 2 (two) times daily., Disp: 180 tablet, Rfl: 3 .  nitroGLYCERIN (NITROSTAT) 0.4 MG SL tablet, Place 1 tablet (0.4 mg total) under the tongue every 5 (five) minutes as needed for chest pain (up to 3 doses)., Disp: 25 tablet, Rfl: 3 .  PROCTOZONE-HC 2.5 % rectal cream, Apply 1 application topically daily as needed for hemorrhoids or itching. , Disp: , Rfl:  .  rosuvastatin (CRESTOR) 20 MG tablet, Take 1 tablet (20 mg total) by mouth daily., Disp: 30 tablet, Rfl: 11 .  ticagrelor (BRILINTA) 90 MG TABS tablet, Take 1 tablet (90 mg total) by mouth 2 (two) times daily., Disp: 180 tablet, Rfl: 3 .  clobetasol ointment (TEMOVATE) 5.36 %, Apply 1 application topically as needed (skin irritation). Daily, Disp: , Rfl: 1  Past Medical  History: Past Medical History:  Diagnosis Date  . Aortic stenosis    a. moderate by echo 03/2016; based on cath 03/2016 it is suspected that much of his gradient is subvalvular.  . Arthritis    "pain in my knees" (03/30/2016)  . CAD S/P percutaneous coronary angioplasty 03/31/2016   a. CP/USA -> LHC 03/31/16: 90% prox-mid RCA s/p DES, 40% prox-mid LAD, 25% D2, 25% prox-mid Cx, elevated LVEDP 33.  . Chronic diastolic CHF (congestive heart failure) ([place]) 03/31/2016  . Chronic lower back pain   . Dyslipidemia   . Heart murmur    "since I was a kid" (03/30/2016)  . LVH (left ventricular hypertrophy)    a. severe basal septal hypertrophy by echo 03/2016.  . Mitral regurgitation    a. mild by echo 03/2016.  Marland Kitchen Psoriasis   . Rheumatic fever    as a child  . 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 Sinus bradycardia - baseline HR 50s at times.   . Sleep apnea   . 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 Thrombosed hemorrhoids     Tobacco Use: History  Smoking Status  . Never Smoker  Smokeless Tobacco  . Never Used    Labs: Recent Review Flowsheet Data    Labs for ITP Cardiac and Pulmonary Rehab Latest Ref Rng & Units 03/30/2016 03/30/2016  04/07/2016 05/21/2016 08/06/2016   Cholestrol <200 mg/dL - - 197 117(L) 130   LDLCALC <100 mg/dL - - 134(H) 57 64   HDL >40 mg/dL - - 37(L) 44 50   Trlycerides <150 mg/dL - - 131 82 82   PHART 7.350 - 7.450 7.398 - - - -   PCO2ART 35.0 - 45.0 mmHg 44.1 - - - -   HCO3 20.0 - 24.0 mEq/L 27.2(H) 25.1(H) - - -   TCO2 0 - 100 mmol/L 29 27 - - -   ACIDBASEDEF 0.0 - 2.0 mmol/L - 1.0 - - -   O2SAT % 91.0 67.0 - - -      Capillary Blood Glucose: Lab Results  Component Value Date   GLUCAP 123 (H) 08/09/2011     Exercise Target Goals:    Exercise Program Goal: Individual exercise prescription set with THRR, safety & activity barriers. Participant demonstrates ability to understand and report RPE  using BORG scale, to self-measure pulse accurately, and to acknowledge the importance of the exercise prescription.  Exercise Prescription Goal: Starting with aerobic activity 30 plus minutes a day, 3 days per week for initial exercise prescription. Provide home exercise prescription and guidelines that participant acknowledges understanding prior to discharge.  Activity Barriers & Risk Stratification:     Activity Barriers & Cardiac Risk Stratification - 05/06/16 1215      Activity Barriers & Cardiac Risk Stratification   Cardiac Risk Stratification High      6 Minute Walk:     6 Minute Walk    Row Name 05/06/16 1403 08/13/16 1705       6 Minute Walk   Phase Initial Discharge    Distance 1662 feet 1774 feet    Distance % Change  - 4.93 %    Walk Time 6 minutes 6 minutes    # of Rest Breaks 0 0    MPH 3.14 3.3    METS 3.43 3.93    RPE 13 15    VO2 Peak 12.02 13.77    Symptoms No Yes (comment)    Comments  - B knee discomfort    Resting HR 76 bpm 67 bpm    Resting BP 142/80 140/84    Max Ex. HR 106 bpm 125 bpm    Max Ex. BP 136/80 152/84    2 Minute Post BP 140/76  5 minutes later 126/86 122/62       Initial Exercise Prescription:     Initial Exercise Prescription - 05/06/16 1400      Date of Initial Exercise RX and Referring Provider   Date 05/06/16   Referring Provider Fransico Him MD     Treadmill   MPH 2.5   Grade 1   Minutes 10   METs 3.43     Bike   Level 0.08   Minutes 10   METs 2.89     NuStep   Level 3   Minutes 10   METs 2     Prescription Details   Frequency (times per week) 3   Duration Progress to 30 minutes of continuous aerobic without signs/symptoms of physical distress     Intensity   THRR 40-80% of Max Heartrate 60-119   Ratings of Perceived Exertion 11-13   Perceived Dyspnea 0-4     Progression   Progression Continue to progress workloads to maintain intensity without signs/symptoms of physical distress.     Resistance  Training   Training Prescription Yes   Weight 2lbs  Reps 10-12      Perform Capillary Blood Glucose checks as needed.  Exercise Prescription Changes:      Exercise Prescription Changes    Row Name 05/24/16 1700 06/22/16 1600 07/21/16 1000         Exercise Review   Progression Yes Yes Yes       Response to Exercise   Blood Pressure (Admit) 112/78 120/82 140/84     Blood Pressure (Exercise) 130/70 138/80 146/82     Blood Pressure (Exit) 130/70 112/78 102/68     Heart Rate (Admit) 85 bpm 78 bpm 83 bpm     Heart Rate (Exercise) 106 bpm 104 bpm 111 bpm     Heart Rate (Exit) 85 bpm 69 bpm 83 bpm     Rating of Perceived Exertion (Exercise) 13 13 14      Symptoms  - none none     Comments  - Reviewed HEP on 06/16/16 Reviewed HEP on 06/16/16     Duration  - Progress to 30 minutes of continuous aerobic without signs/symptoms of physical distress Progress to 30 minutes of continuous aerobic without signs/symptoms of physical distress     Intensity  - THRR unchanged THRR unchanged       Progression   Average METs  - 4 4.1       Resistance Training   Training Prescription Yes Yes Yes     Weight 2lbs 5lbs 6lbs     Reps 10-12 10-12 10-12       Treadmill   MPH 2.5 2.5 2.5     Grade 1 4 4      Minutes 10 15 15      METs 3.43 4.29 4.29       Bike   Level 0.08 1.2  -     Minutes 10 15  -     METs 2.89 3.8  -       NuStep   Level 3  - 5     Minutes 10  - 15     METs 2  - 4.1       Home Exercise Plan   Plans to continue exercise at  [facility] (comment)  Reviewed on 06/16/16 see progress note. Goes to [employer] (comment)  Reviewed on 06/16/16 see progress note. Goes to [facility] 3 additional days to program exercise sessions. Add 3 additional days to program exercise sessions.        Exercise Comments:      Exercise Comments    Row Name 05/24/16 1701 06/22/16 1626 07/21/16 1055 08/13/16 1725     Exercise Comments  Reviewed METs and goals. Pt is tolerating exercise well; will continue to monitor exercise progression. Reviewed METs and goals. Pt is tolerating exercise well; will continue to monitor exercise progression. Reviewed METs and goals. Pt is tolerating exercise well; will continue to monitor exercise progression. Pt completed 23 sessions of cardiac rehab. Pt plans to continue exercise at [facility] 3-4x/week. Discussed emergency/temperature precautions.       Discharge Exercise Prescription (Final Exercise Prescription Changes):     Exercise Prescription Changes - 07/21/16 1000      Exercise Review   Progression Yes     Response to Exercise   Blood Pressure (Admit) 140/84   Blood Pressure (Exercise) 146/82   Blood Pressure (Exit) 102/68   Heart Rate (Admit) 83 bpm   Heart Rate (Exercise) 111 bpm   Heart Rate (  Exit) 83 bpm   Rating of Perceived Exertion (Exercise) 14   Symptoms none   Comments Reviewed HEP on 06/16/16   Duration Progress to 30 minutes of continuous aerobic without signs/symptoms of physical distress   Intensity THRR unchanged     Progression   Average METs 4.1     Resistance Training   Training Prescription Yes   Weight 6lbs   Reps 10-12     Treadmill   MPH 2.5   Grade 4   Minutes 15   METs 4.29     NuStep   Level 5   Minutes 15   METs 4.1     Home Exercise Plan   Plans to continue exercise at [facility] (comment)  Reviewed on 06/16/16 see progress note. Goes to [employer] Add 3 additional days to program exercise sessions.      Nutrition:  Target Goals: Understanding of nutrition guidelines, daily intake of sodium <1577m, cholesterol <2057m calories 30% from fat and 7% or less from saturated fats, daily to have 5 or more servings of fruits and vegetables.  Biometrics:     Pre Biometrics - 05/06/16 1212      Pre Biometrics   Waist Circumference 40.5 inches   Hip Circumference 39.75 inches   Waist to Hip Ratio 1.02 %    Triceps Skinfold 16 mm   % Body Fat 29.1 %   Grip Strength 42 kg   Flexibility 12 in   Single Leg Stand 30 seconds         Post Biometrics - 08/13/16 1708       Post  Biometrics   Height 5' 5.5" (1.664 m)   Weight 180 lb 1.9 oz (81.7 kg)   Waist Circumference 40.5 inches   Hip Circumference 39.5 inches   Waist to Hip Ratio 1.03 %   BMI (Calculated) 29.6   Triceps Skinfold 12 mm   % Body Fat 27.9 %   Grip Strength 42 kg   Flexibility 12 in   Single Leg Stand 30 seconds      Nutrition Therapy Plan and Nutrition Goals:     Nutrition Therapy & Goals - 06/14/16 1443      Nutrition Therapy   Diet Therapeutic Lifestyle Changes     Personal Nutrition Goals   Personal Goal #1 1-2 lb wt loss/week to a wt loss goal of 6-15 lb [facility] regarding individualized specific dietary modifications aiming towards targeted core components such as weight, hypertension, lipid management, diabetes, heart failure and other comorbidities.   Expected Outcomes Short Term Goal: Understand basic principles of dietary content, such as calories, fat, sodium, cholesterol and nutrients.;Long Term Goal: Adherence to prescribed nutrition plan.      Nutrition Discharge: Nutrition Scores:     Nutrition Assessments - 06/14/16 1443      MEDFICTS Scores   Pre Score 56      Nutrition Goals Re-Evaluation:   Psychosocial: Target Goals: Acknowledge presence or absence of depression, maximize coping skills, provide positive support system. Participant is able to verbalize types and ability to use techniques and skills needed for reducing stress and depression.  Initial Review & Psychosocial Screening:     Initial Psych Review & Screening - 05/06/16 15DanaYes     Barriers   Psychosocial barriers to participate in program  There are no identifiable barriers or  psychosocial needs.     Screening Interventions   Interventions Encouraged to exercise      Quality of Life Scores:     Quality of Life - 08/13/16 1708      Quality of Life Scores   Health/Function Pre 18.47 %   Health/Function Post 18.8 %   Health/Function % Change 1.79 %   Socioeconomic Pre 22.64 %   Socioeconomic Post 18.5 %   Socioeconomic % Change  -18.29 %   Psych/Spiritual Pre 21.43 %   Psych/Spiritual Post 18 %   Psych/Spiritual % Change -16.01 %   Family Pre 22.8 %   Family Post 18 %   Family % Change -21.05 %   GLOBAL Pre 20.57 %   GLOBAL Post 18.46 %   GLOBAL % Change -10.26 %      PHQ-9: Recent Review Flowsheet Data    Depression screen [facility] 2/9 08/10/2016 05/10/2016   Decreased Interest 0 0   Down, Depressed, Hopeless 0 1   PHQ - 2 Score 0 1      Psychosocial Evaluation and Intervention:     Psychosocial Evaluation - 08/10/16 1025      Psychosocial Evaluation & Interventions   Continued Psychosocial Services Needed No     Discharge Psychosocial Assessment & Intervention   Discharge Continue support measures as needed   Comments no psychosocial needs identified, no interventions necessary       Psychosocial Re-Evaluation:     Psychosocial Re-Evaluation    Row Name 05/26/16 1721 06/22/16 1009 07/20/16 1128         Psychosocial Re-Evaluation   Interventions Encouraged to attend Cardiac Rehabilitation for the exercise Encouraged to attend Cardiac Rehabilitation for the exercise Stress management education;Relaxation education;Encouraged to attend Cardiac Rehabilitation for the exercise     Comments no psychosocial needs identified, no intervention necessary.  pt exercise is limited by knee pain. pt encouraged to notify staff if assigned equipment/exercise worsens knee discomfort and to avoid activities that increase pain.   no psychosocial needs identified, no intervention necessary.  pt exercise is limited by knee pain. pt encouraged to notify  staff if assigned equipment/exercise worsens knee discomfort and to avoid activities that increase pain.   no psychosocial needs identified, no intervention necessary. pt exercise limited by knee pain, although pt has learned to modify routine to avoid increased pain.      Continued Psychosocial Services Needed No No No        Vocational Rehabilitation: Provide vocational rehab assistance to qualifying candidates.   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 Participant will state understanding/return demonstration of topics presented.  Learning Barriers/Preferences:     Learning Barriers/Preferences - 05/06/16 1409      Learning Barriers/Preferences   Learning Barriers Sight      Education Topics: Count Your Pulse:  -Group instruction provided by verbal instruction, demonstration, patient participation and written materials to support subject.  Instructors address importance of being able to find your pulse and how to count your pulse when at home without a heart monitor.  Patients get hands on experience counting their pulse with staff help and individually.  Heart Attack, Angina, and Risk Factor Modification:  -Group instruction provided by verbal instruction, video, and written materials to support subject.  Instructors address signs and symptoms of angina and heart attacks.    Also discuss risk factors for heart disease and how to make changes to improve heart health risk factors.   Functional Fitness:  -Group instruction provided by verbal instruction, demonstration, patient participation, and written materials to support subject.  Instructors address safety measures for  doing things around the house.  Discuss how to get up and down off the floor, how to pick things up properly, how to safely get out of a chair without assistance, and balance training.   Meditation and Mindfulness:  -Group instruction provided by verbal instruction, patient participation, and written materials to support subject.  Instructor addresses importance of mindfulness and meditation practice to help reduce stress and improve awareness.  Instructor also leads participants through a meditation exercise.  Flowsheet Row CARDIAC REHAB PHASE II EXERCISE from 07/19/2016 in [place]  Date  06/09/16  Educator  Jeanella Craze  Instruction Review Code  2- meets goals/outcomes      Stretching for Flexibility and Mobility:  -Group instruction provided by verbal instruction, patient participation, and written materials to support subject.  Instructors lead participants through series of stretches that are designed to increase flexibility thus improving mobility.  These stretches are additional exercise for major muscle groups that are typically performed during regular warm up and cool down.   Hands Only CPR Anytime:  -Group instruction provided by verbal instruction, video, patient participation and written materials to support subject.  Instructors co-teach with AHA video for hands only CPR.  Participants get hands on experience with mannequins.   Nutrition I class: Heart Healthy Eating:  -Group instruction provided by PowerPoint slides, verbal discussion, and written materials to support subject matter. The instructor gives an explanation and review of the Therapeutic Lifestyle Changes diet recommendations, which includes a discussion on lipid goals, dietary fat, sodium, fiber, plant stanol/sterol esters, sugar, and the components of a well-balanced, healthy diet. Flowsheet Row CARDIAC REHAB PHASE II EXERCISE from 07/19/2016 in [place]  Date  07/20/16  Educator  RD  Instruction Review Code  2- meets goals/outcomes      Nutrition II class: Lifestyle Skills:  -Group instruction provided by PowerPoint slides, verbal discussion, and written materials to support subject matter. The instructor gives an explanation and review of label reading, grocery shopping for heart health, heart healthy recipe modifications, and ways to make healthier choices when eating out. Flowsheet Row CARDIAC REHAB PHASE II EXERCISE from 07/19/2016 in [place]  Date  06/29/16  Educator  RD  Instruction Review Code  2- meets goals/outcomes      Diabetes Question & Answer:  -Group instruction provided by PowerPoint slides, verbal discussion, and written materials to support subject matter. The instructor gives an explanation and review of diabetes co-morbidities, pre- and post-prandial blood glucose goals, pre-exercise blood glucose goals, signs, symptoms, and treatment of hypoglycemia and hyperglycemia, and foot care basics.   Diabetes Blitz:  -Group instruction provided by PowerPoint slides, verbal discussion, and written materials to support subject matter. The instructor gives an explanation and review of the physiology behind type 1 and type 2 diabetes, diabetes medications and rational behind using different medications, pre- and post-prandial blood glucose recommendations and Hemoglobin A1c goals, diabetes diet, and exercise including blood glucose guidelines for exercising safely.  Portion Distortion:  -Group instruction provided by PowerPoint slides, verbal discussion, written materials, and food models to support subject matter. The instructor gives an explanation of serving size versus portion size, changes in portions sizes over the last 20 years, and what consists of a serving from each food group.   Stress Management:  -Group instruction provided by verbal instruction, video, and written materials  to support subject matter.  Instructors review role of stress in heart disease and how to cope with stress positively.   Flowsheet Row CARDIAC REHAB PHASE II EXERCISE from 07/19/2016 in [place]  Date  06/30/16  Instruction Review Code  2- meets goals/outcomes      Exercising on Your Own:  -Group instruction provided by verbal instruction, power point, and written materials to support subject.  Instructors discuss benefits of exercise, components of exercise, frequency and intensity of exercise, and end points for exercise.  Also discuss use of nitroglycerin and activating EMS.  Review options of places to exercise outside of rehab.  Review guidelines for sex with heart disease.   Cardiac Drugs I:  -Group instruction provided by verbal instruction and written materials to support subject.  Instructor reviews cardiac drug classes: antiplatelets, anticoagulants, beta blockers, and statins.  Instructor discusses reasons, side effects, and lifestyle considerations for each drug class. Flowsheet Row CARDIAC REHAB PHASE II EXERCISE from 07/19/2016 in [place]  Date  05/26/16  Instruction Review Code  2- meets goals/outcomes      Cardiac Drugs II:  -Group instruction provided by verbal instruction and written materials to support subject.  Instructor reviews cardiac drug classes: angiotensin converting enzyme inhibitors (ACE-I), angiotensin II receptor blockers (ARBs), nitrates, and calcium channel blockers.  Instructor discusses reasons, side effects, and lifestyle considerations for each drug class. Flowsheet Row CARDIAC REHAB PHASE II EXERCISE from 07/19/2016 in [place]  Date  06/23/16  Educator  pharmacist  Instruction Review Code  2- meets goals/outcomes      Anatomy and Physiology of the Circulatory System:  -Group instruction provided by verbal instruction, video, and written materials to  support subject.  Reviews functional anatomy of heart, how it relates to various diagnoses, and what role the heart plays in the overall system.   Knowledge Questionnaire Score:     Knowledge Questionnaire Score - 07/27/16 1319      Knowledge Questionnaire Score   Post Score 22/28      Core Components/Risk Factors/Patient Goals at Admission:     Personal Goals and Risk Factors at Admission - 05/06/16 1410      Core Components/Risk Factors/Patient Goals on Admission   Lipids Yes   Intervention Provide education and support for participant on nutrition & aerobic/resistive exercise along with prescribed medications to achieve LDL <50m, HDL >4101m   Expected Outcomes Short Term: Participant states understanding of desired cholesterol values and is compliant with medications prescribed. Participant is following exercise prescription and nutrition guidelines.;Long Term: Cholesterol controlled with medications as prescribed, with individualized exercise RX and with personalized nutrition plan. Value goals: LDL < 7021mHDL > 40 mg.   Stress Yes   Intervention Offer individual and/or small group education and counseling on adjustment to heart disease, stress management and health-related lifestyle change. Teach and support self-help strategies.;Refer participants experiencing significant psychosocial distress to appropriate mental health specialists for further evaluation and treatment. When possible, include family members and significant others in education/counseling sessions.   Expected Outcomes Short Term:  Participant demonstrates changes in health-related behavior, relaxation and other stress management skills, ability to obtain effective social support, and compliance with psychotropic medications if prescribed.;Long Term: Emotional wellbeing is indicated by absence of clinically significant psychosocial distress or social isolation.      Core Components/Risk Factors/Patient Goals Review:       Goals and Risk Factor Review    Row Name 05/24/16 1702 05/26/16 1657 06/22/16 1627 07/20/16 0857       Core Components/Risk Factors/Patient Goals Review   Personal Goals Review Other Other Other;Increase Strength and Stamina  -    Review Pt will be out of town for the rest of the week. Discussed home exercise and staying on track with nutrtion  Pt will be out of town for the rest of the week. Discussed home exercise and staying on track with nutrtion. Pt does feel that the program is beneficial and noticed an increase in exercise tolerance Pt is able to tolerate an increase in exercise workloads and activity levels but feels as though progression is minimal due to knee pain Pt has noticed an increase in stamina and energy levels with exercise. Pt is expressed being symptom free when exercising, other than B knee pain. Pt is wearing knee braces while exercising and find it helpful.    Expected Outcomes Pt will be compliant with home exercise plan while he is out of town Pt will be compliant with home exercise plan while he is out of town. Pt will also increase overall exercise tolerance and aerobic fitness. Pt will be able to increase exercise tolerance and manage knee pain Pt will be able to increase exercise tolerance and manage knee pain       Core Components/Risk Factors/Patient Goals at Discharge (Final Review):      Goals and Risk Factor Review - 07/20/16 0857      Core Components/Risk Factors/Patient Goals Review   Review Pt has noticed an increase in stamina and energy levels with exercise. Pt is expressed being symptom free when exercising, other than B knee pain. Pt is wearing knee braces while exercising and find it helpful.   Expected Outcomes Pt will be able to increase exercise tolerance and manage knee pain      ITP Comments:     ITP Comments    Row Name 05/06/16 0933 05/21/16 1635         ITP Comments Dr. Loreta Ave, Medical Director attended hypertension  education lecture, goals met          Comments: Pt graduated from cardiac rehab program today with completion of 23 exercise sessions in Phase II. Pt progressed nicely during his participation in rehab as evidenced by increased MET level.  Pt activity level somewhat limited by knee arthritic pain.  Pt is awaiting knee replacement surgery.    Medication list reconciled. Repeat  PHQ score- 0.  Pt feels he has achieved his goals during cardiac rehab.   Pt plans to continue exercising on his own at home.

## 2016-08-11 ENCOUNTER — Encounter (HOSPITAL_COMMUNITY): Admission: RE | Admit: 2016-08-11 | Payer: Medicare Other | Source: Ambulatory Visit

## 2016-08-13 ENCOUNTER — Encounter (HOSPITAL_COMMUNITY): Payer: Medicare Other

## 2016-08-18 ENCOUNTER — Encounter (HOSPITAL_COMMUNITY): Payer: Medicare Other

## 2016-08-20 ENCOUNTER — Encounter (HOSPITAL_COMMUNITY): Payer: Medicare Other

## 2016-08-20 NOTE — Addendum Note (Signed)
Encounter addended by: Lowell Guitar, RN on: 08/20/2016 12:36 PM<BR>    Actions taken: Flowsheet data copied forward, Visit Navigator Flowsheet section accepted

## 2016-08-23 ENCOUNTER — Encounter (HOSPITAL_COMMUNITY): Payer: Medicare Other

## 2016-08-25 ENCOUNTER — Encounter (HOSPITAL_COMMUNITY): Payer: Medicare Other

## 2016-08-27 ENCOUNTER — Encounter (HOSPITAL_COMMUNITY): Payer: Medicare Other

## 2016-08-30 ENCOUNTER — Encounter (HOSPITAL_COMMUNITY): Payer: Medicare Other

## 2016-08-31 ENCOUNTER — Ambulatory Visit: Payer: Medicare Other | Admitting: Neurology

## 2016-08-31 ENCOUNTER — Ambulatory Visit (INDEPENDENT_AMBULATORY_CARE_PROVIDER_SITE_OTHER): Payer: Medicare Other | Admitting: Neurology

## 2016-08-31 ENCOUNTER — Encounter: Payer: Self-pay | Admitting: Neurology

## 2016-08-31 VITALS — BP 138/78 | HR 81 | Ht 65.5 in | Wt 175.1 lb

## 2016-08-31 DIAGNOSIS — F321 Major depressive disorder, single episode, moderate: Secondary | ICD-10-CM

## 2016-08-31 DIAGNOSIS — G40009 Localization-related (focal) (partial) idiopathic epilepsy and epileptic syndromes with seizures of localized onset, not intractable, without status epilepticus: Secondary | ICD-10-CM

## 2016-08-31 MED ORDER — LEVETIRACETAM 750 MG PO TABS: 750.0000 mg | ORAL_TABLET | Freq: Two times a day (BID) | ORAL | 3 refills | Status: DC

## 2016-08-31 NOTE — Patient Instructions (Signed)
1. Continue Keppra 750mg  twice a day 2. Refer to Behavioral Medicine for counseling 3. Follow-up in 6 months, call for any changes  Seizure Precautions: 1. If medication has been prescribed for you to prevent seizures, take it exactly as directed.  Do not stop taking the medicine without talking to your doctor first, even if you have not had a seizure in a long time.   2. Avoid activities in which a seizure would cause danger to yourself or to others.  Don't operate dangerous machinery, swim alone, or climb in high or dangerous places, such as on ladders, roofs, or girders.  Do not drive unless your doctor says you may.  3. If you have any warning that you may have a seizure, lay down in a safe place where you can't hurt yourself.    4.  No driving for 6 months from last seizure, as per [facility].   Please refer to the following link on the [place] website for more information: http://[email or web address]/drivingu.cfm   5.  Maintain good sleep hygiene. Avoid alcohol.  6.  Contact your doctor if you have any problems that may be related to the medicine you are taking.  7.  Call 911 and bring the patient back to the ED if:        A.  The seizure lasts longer than 5 minutes.       B.  The patient doesn't awaken shortly after the seizure  C.  The patient has new problems such as difficulty seeing, speaking or moving  D.  The patient was injured during the seizure  E.  The patient has a temperature over 102 F (39C)  F.  The patient vomited and now is having trouble breathing

## 2016-08-31 NOTE — Progress Notes (Signed)
NEUROLOGY FOLLOW UP OFFICE NOTE  LEIGHLAND MISFELDT [id number]  HISTORY OF PRESENT ILLNESS: I had the pleasure of seeing Douglas Lane in follow-up in the neurology clinic on 08/16/2016.  The patient was last seen a year ago recurrent seizures. He had been seizure-free for a year off Keppra, and had tapered of clorazepate which he was taking for anxiety. He had a generalized convulsion on 07/31/14, 3 days after stopping clorazepate. He was restarted on Keppra 500mg  BID. His wife had also reported that after stopping the Keppra, he had prolonged episodes of memory lapses where he could not recall the last few hours. Since his last visit, he denies any further convulsions. He had called our office to report an episode of memory lapse on 02/22/16. He was at church, he recalls standing at the end of service, then next thing he recalls is being in the car on the way home. He called his wife and told her where he was, but has no recollection of previous 30-50mins. She told him that he sounded confused, which he did because he had planned to make stops but he did not do so. He denied any associated headache, focal numbness/tingling/weakness, dizziness. There was a cup of coffee in the car that he probably got on his way out, but did not recall getting this. He denied any missed medications, sleep deprivation, infection. There had been plenty of stress, related to wife's parents and travelling. Dose of Keppra was increased to 750mg  BID. He is tolerating this without side effects, no further similar spells. He feels overall generally healthier, but does endorse a lot of family stress. He has difficulty sleeping, worrying and feeling depressed. He has some left neck and lower back pain, no bowel/bladder dysfunction. His left knee bothers him.   HPI 08/20/2014: This is a 71 yo RH man with a history of 2 seizures in 2012 that occurred 6 months apart, presenting after ER visit for a seizure last 08/03/14. The first seizure  occurred in June 2012. He recalls it was a stressful period just soon after he retired. He had a nocturnal convulsion and was brought to [facility]. He was not started on medication initially as it was his first seizure. On December 2012, he had another nocturnal convulsion and was started on Keppra 500mg  BID with no further seizures for 3 years. He and his neurologist in [facility] agreed to start weaning off Keppra, and this was completely discontinued the spring/summer of 2015. Around 2-1/2 years ago, he had also been reporting anxiety to his neurologist, and was started on clorazepate 7.5mg , weaned to 1/2 tab for 6 months, then discontinued 3 days prior to the most recent nocturnal seizure on 12/16 off AEDs. He was brought to [facility] ER, there is no bloodwork or drug screen for review, CT head unremarkable.He was found to have posterior dislocation and comminuted fracture of the right humeral head. He had soft tissue injury extending along the right anterior proximal arm. This was reduced in the ER and he continues to wear a sling. He was restarted on Keppra 500mg  BID with no side effects except for mild drowsiness.   His wife reports that since coming off the [place] last year, he has had 3 incidences of memory lapses, where he could not recall the last few hours. His wife reports that he would look confused but he can function and drive. The last episode occurred 6 weeks ago, he did not recall being in church. His wife  reports he was talking fine but had a different look about him. This lasted about 1-2 hours. He had neuropsychological testing for memory loss and was told he has "short-term memory problems." In hindsight, when asked about an MRI done in January 2012, he had an episode where he had transient memory loss while he was at a [place]. I personally reviewed MRI brain without contrast done which showed mild chronic microvascular changes in the bilateral subcortical regions and right central  pons.  He denies any episodes of staring/unresponsiveness, no olfactory/gustatory hallucinations, deja vu, rising epigastric sensation, focal numbness/tingling/weakness, myoclonic jerks. He has 1-4 drinks daily (beer or whisky), with no change in pattern for many years. He denies any sleep deprivation prior to the seizures, but reports these being stressful periods. 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 There is no history of febrile convulsions, CNS infections such as meningitis/encephalitis, significant traumatic brain injury, neurosurgical procedures, or family history of seizures.   PAST MEDICAL HISTORY: Past Medical History:  Diagnosis Date  . Aortic stenosis    a. moderate by echo 03/2016; based on cath 03/2016 it is suspected that much of his gradient is subvalvular.  . Arthritis    "pain in my knees" (03/30/2016)  . CAD S/P percutaneous coronary angioplasty 03/31/2016   a. CP/USA -> LHC 03/31/16: 90% prox-mid RCA s/p DES, 40% prox-mid LAD, 25% D2, 25% prox-mid Cx, elevated LVEDP 33.  . Chronic diastolic CHF (congestive heart failure) ([place] [place]) 03/31/2016  . Chronic lower back pain   . Dyslipidemia   . Heart murmur    "since I was a kid" (03/30/2016)  . LVH (left ventricular hypertrophy)    a. severe basal septal hypertrophy by echo 03/2016.  . Mitral regurgitation    a. mild by echo 03/2016.  Marland Kitchen Psoriasis   . Rheumatic fever    as a child  . 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 Sinus bradycardia - baseline HR 50s at times.   . Sleep apnea   . 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 Thrombosed hemorrhoids     MEDICATIONS: Current Outpatient Prescriptions on File Prior to Visit  Medication Sig Dispense Refill  . acetaminophen (TYLENOL) 500 MG tablet Take 1,000 mg by mouth every 8 (eight) hours as needed.    Marland Kitchen aspirin 81 MG tablet  Take 81 mg by mouth daily.    Marland Kitchen desonide (DESOWEN) 0.05 % cream Apply 1 application topically as needed.    . diphenhydrAMINE (BENADRYL) 2 % cream Apply topically as needed for itching.    Marland Kitchen HUMIRA PEN 40 MG/0.8ML PNKT Inject 40 mg into the skin every 21 ( twenty-one) days.     Marland Kitchen levETIRAcetam (KEPPRA) 750 MG tablet Take 1 tablet (750 mg total) by mouth 2 (two) times daily. 180 tablet 3  . nitroGLYCERIN (NITROSTAT) 0.4 MG SL tablet Place 1 tablet (0.4 mg total) under the tongue every 5 (five) minutes as needed for chest pain (up to 3 doses). 25 tablet 3  . rosuvastatin (CRESTOR) 20 MG tablet Take 1 tablet (20 mg total) by mouth daily. 30 tablet 11  . ticagrelor (BRILINTA) 90 MG TABS tablet Take 1 tablet (90 mg total) by mouth 2 (two) times daily. 180 tablet 3  . clobetasol ointment (TEMOVATE) [id number] % Apply 1 application topically as needed (  skin irritation). Daily  1  . PROCTOZONE-HC 2.5 % rectal cream Apply 1 application topically daily as needed for hemorrhoids or itching.      No current facility-administered medications on file prior to visit.     ALLERGIES: No Known Allergies  FAMILY HISTORY: Family History  Problem Relation Age of Onset  . Renal Disease Mother   . Congestive Heart Failure Father   . Depression Sister     SOCIAL HISTORY: Social History   Social History  . Marital status: Married    Spouse name: N/A  . Number of children: N/A  . Years of education: N/A   Occupational History  . Not on file.   Social History Main Topics  . Smoking status: Never Smoker  . Smokeless tobacco: Never Used  . Alcohol use 9.0 oz/week    10 Cans of beer, 5 Shots of liquor per week  . Drug use: No  . Sexual activity: Not Currently   Other Topics Concern  . Not on file   Social History Narrative  . No narrative on file    REVIEW OF SYSTEMS: Constitutional: No fevers, chills, or sweats, no generalized fatigue, change in appetite Eyes: No visual changes, double vision, eye  pain Ear, nose and throat: No hearing loss, ear pain, nasal congestion, sore throat Cardiovascular: No chest pain, palpitations Respiratory:  No shortness of breath at rest or with exertion, wheezes GastrointestinaI: No nausea, vomiting, diarrhea, abdominal pain, fecal incontinence Genitourinary:  No dysuria, urinary retention or frequency Musculoskeletal:  +joint pain, left knee pain Integumentary: No rash, pruritus, skin lesions Neurological: as above Psychiatric: No depression, insomnia, anxiety Endocrine: No palpitations, fatigue, diaphoresis, mood swings, change in appetite, change in weight, increased thirst Hematologic/Lymphatic:  No anemia, purpura, petechiae. Allergic/Immunologic: no itchy/runny eyes, nasal congestion, recent allergic reactions, rashes  PHYSICAL EXAM: Vitals:   08/31/16 1340  BP: 138/78  Pulse: 81   General: No acute distress Head:  Normocephalic/atraumatic Neck: supple, no paraspinal tenderness, full range of motion Heart:  Regular rate and rhythm Lungs:  Clear to auscultation bilaterally Back: No paraspinal tenderness Skin/Extremities: No rash, no edema Neurological Exam: alert and oriented to person, place, and time. No aphasia or dysarthria. Fund of knowledge is appropriate.  Recent and remote memory are intact. 3/3 delayed recall.  Attention and concentration are normal.    Able to name objects and repeat phrases. Cranial nerves: Pupils equal, round, reactive to light.  Extraocular movements intact with no nystagmus. Visual fields full. Facial sensation intact. No facial asymmetry. Tongue, uvula, palate midline.  Motor: Bulk and tone normal, muscle strength 5/5 throughout with no pronator drift.  Sensation to light touch intact.  No extinction to double simultaneous stimulation.  Deep tendon reflexes 2+ throughout, toes downgoing.  Finger to nose testing intact.  Gait slow and cautious due to bilateral knee pain, unable to tandem walk due to pain. Romberg  negative.  IMPRESSION: This is a 71 yo RH man with a history of 2 seizures in 2012. As he had been seizure-free for 3 years, Keppra was tapered off last spring/summer 2015. He had been taking clorazepate for anxiety and was tapered off this 3 days prior to the most recent nocturnal seizure last 08/03/14. 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 No further convulsions since 07/2014. He had another episode of gap in time last July 2017, Keppra dose increased to 750mg  BID, no further episodes since then,  no side effects on Keppra. He is endorsing again family stress and depression, and would like to proceed with seeing a counselor, referral will be sent. He is aware of [place] driving laws that indicate one has to stop driving after a seizure, until 6 months seizure-free. He will follow-up in 6 months or earlier if needed.   Thank you for allowing me to participate in his care.  Please do not hesitate to call for any questions or concerns.  The duration of this appointment visit was 24 minutes of face-to-face time with the patient.  Greater than 50% of this time was spent in counseling, explanation of diagnosis, planning of further management, and coordination of care.   Ellouise Newer, M.D.   CC: Dr. Harrington Challenger

## 2016-09-01 ENCOUNTER — Encounter (HOSPITAL_COMMUNITY): Payer: Medicare Other

## 2016-09-03 ENCOUNTER — Encounter (HOSPITAL_COMMUNITY): Payer: Medicare Other

## 2016-09-06 ENCOUNTER — Encounter (HOSPITAL_COMMUNITY): Payer: Medicare Other

## 2016-10-13 ENCOUNTER — Ambulatory Visit: Payer: Medicare Other | Admitting: Psychiatry

## 2016-10-15 ENCOUNTER — Encounter: Payer: Self-pay | Admitting: Cardiology

## 2016-10-20 ENCOUNTER — Telehealth: Payer: Self-pay | Admitting: Cardiology

## 2016-10-20 NOTE — Telephone Encounter (Signed)
Informed patient his cholesterol was just checked 12/22 and does not need to be checked again just yet. Informed him any other lab work Dr. Radford Pax would like can be drawn at her appointment 3/12. He was grateful for call back.

## 2016-10-20 NOTE — Telephone Encounter (Signed)
New Message  Pt call requesting to speak with RN. Pt would like to schedule lab work for the same day as his Echo on 3/8. Please call back to discuss

## 2016-10-21 ENCOUNTER — Ambulatory Visit (HOSPITAL_COMMUNITY): Payer: Medicare Other | Attending: Internal Medicine

## 2016-10-21 ENCOUNTER — Other Ambulatory Visit: Payer: Self-pay

## 2016-10-21 DIAGNOSIS — I340 Nonrheumatic mitral (valve) insufficiency: Secondary | ICD-10-CM | POA: Diagnosis not present

## 2016-10-21 DIAGNOSIS — I509 Heart failure, unspecified: Secondary | ICD-10-CM | POA: Insufficient documentation

## 2016-10-21 DIAGNOSIS — I350 Nonrheumatic aortic (valve) stenosis: Secondary | ICD-10-CM | POA: Diagnosis not present

## 2016-10-21 DIAGNOSIS — I2510 Atherosclerotic heart disease of native coronary artery without angina pectoris: Secondary | ICD-10-CM

## 2016-10-21 DIAGNOSIS — Z8249 Family history of ischemic heart disease and other diseases of the circulatory system: Secondary | ICD-10-CM | POA: Diagnosis not present

## 2016-10-21 DIAGNOSIS — R569 Unspecified convulsions: Secondary | ICD-10-CM | POA: Insufficient documentation

## 2016-10-21 DIAGNOSIS — Z9861 Coronary angioplasty status: Secondary | ICD-10-CM

## 2016-10-25 ENCOUNTER — Encounter: Payer: Self-pay | Admitting: Cardiology

## 2016-10-25 ENCOUNTER — Ambulatory Visit (INDEPENDENT_AMBULATORY_CARE_PROVIDER_SITE_OTHER): Payer: Medicare Other | Admitting: Cardiology

## 2016-10-25 VITALS — BP 160/78 | HR 62 | Ht 65.5 in | Wt 174.1 lb

## 2016-10-25 DIAGNOSIS — I25119 Atherosclerotic heart disease of native coronary artery with unspecified angina pectoris: Secondary | ICD-10-CM | POA: Diagnosis not present

## 2016-10-25 DIAGNOSIS — R001 Bradycardia, unspecified: Secondary | ICD-10-CM

## 2016-10-25 DIAGNOSIS — T50905A Adverse effect of unspecified drugs, medicaments and biological substances, initial encounter: Secondary | ICD-10-CM

## 2016-10-25 DIAGNOSIS — E785 Hyperlipidemia, unspecified: Secondary | ICD-10-CM | POA: Diagnosis not present

## 2016-10-25 DIAGNOSIS — I350 Nonrheumatic aortic (valve) stenosis: Secondary | ICD-10-CM | POA: Diagnosis not present

## 2016-10-25 LAB — BASIC METABOLIC PANEL
BUN/Creatinine Ratio: 13 (ref 10–24)
BUN: 13 mg/dL (ref 8–27)
CALCIUM: 9.3 mg/dL (ref 8.6–10.2)
CO2: 24 mmol/L (ref 18–29)
Chloride: 97 mmol/L (ref 96–106)
Creatinine, Ser: 0.99 mg/dL (ref 0.76–1.27)
GFR, EST AFRICAN AMERICAN: 88 mL/min/{1.73_m2} (ref >59)
GFR, EST NON AFRICAN AMERICAN: 76 mL/min/{1.73_m2} (ref >59)
Glucose: 96 mg/dL (ref 65–99)
Potassium: 4.2 mmol/L (ref 3.5–5.2)
Sodium: 140 mmol/L (ref 134–144)

## 2016-10-25 LAB — CBC WITH DIFFERENTIAL/PLATELET
BASOS ABS: 0 10*3/uL (ref 0.0–0.2)
BASOS: 0 %
EOS (ABSOLUTE): 0.2 10*3/uL (ref 0.0–0.4)
Eos: 4 %
HEMOGLOBIN: 14.7 g/dL (ref 13.0–17.7)
Hematocrit: 44.1 % (ref 37.5–51.0)
IMMATURE GRANS (ABS): 0 10*3/uL (ref 0.0–0.1)
IMMATURE GRANULOCYTES: 0 %
LYMPHS: 32 %
Lymphocytes Absolute: 1.6 10*3/uL (ref 0.7–3.1)
MCH: 29.6 pg (ref 26.6–33.0)
MCHC: 33.3 g/dL (ref 31.5–35.7)
MCV: 89 fL (ref 79–97)
MONOCYTES: 12 %
Monocytes Absolute: 0.6 10*3/uL (ref 0.1–0.9)
NEUTROS ABS: 2.6 10*3/uL (ref 1.4–7.0)
NEUTROS PCT: 52 %
PLATELETS: 201 10*3/uL (ref 150–379)
RBC: 4.96 x10E6/uL (ref 4.14–5.80)
RDW: 13.8 % (ref 12.3–15.4)
WBC: 5 10*3/uL (ref 3.4–10.8)

## 2016-10-25 LAB — PROTIME-INR
INR: 1 (ref 0.8–1.2)
PROTHROMBIN TIME: 10.7 s (ref 9.1–12.0)

## 2016-10-25 LAB — APTT: APTT: 29 s (ref 24–33)

## 2016-10-25 NOTE — Patient Instructions (Addendum)
Medication Instructions:  Your physician recommends that you continue on your current medications as directed. Please refer to the Current Medication list given to you today.   Labwork: TODAY: pre-procedure labs  Testing/Procedures: Your physician has requested that you have a TEE on Friday, March 23. During a TEE, sound waves are used to create images of your heart. It provides your doctor with information about the size and shape of your heart and how well your heart's chambers and valves are working. In this test, a transducer is attached to the end of a flexible tube that's guided down your throat and into your esophagus (the tube leading from you mouth to your stomach) to get a more detailed image of your heart. You are not awake for the procedure. Please see the instruction sheet given to you today. For further information please visit [email or web address].  Follow-Up: Your physician wants you to follow-up in: 6 months with Dr. Radford Pax. You will receive a reminder letter in the mail two months in advance. If you don't receive a letter, please call our office to schedule the follow-up appointment.   Any Other Special Instructions Will Be Listed Below (If Applicable).     If you need a refill on your cardiac medications before your next appointment, please call your pharmacy.

## 2016-10-25 NOTE — Progress Notes (Signed)
Cardiology Office Note    Date:  10/28/2016   ID:  Douglas Lane, DOB [birth year], MRN [id number]  PCP:  Melinda Crutch, MD  Cardiologist:  Fransico Him, MD   Chief Complaint  Patient presents with  . Aortic Stenosis  . Coronary Artery Disease    History of Present Illness:  Douglas Lane is a 71 y.o. male with dyslipidemia, seizure disorder, sleep apnea, arthritis, hemorrhoids,rheumatic fever as a child, and moderate AS/mild MR, severe basal septal hypertrophy &diastolic dysfunction by echo 03/19/16 and ASCAD with  90% prox-mid RCA s/p DES, 40% prox-mid LAD, 25% D2, 25% prox-mid Cx, elevated LVEDP of 33 by cath 03/2016. There was a large gradient across the aortic valve, but based on the echo and how easily the catheter crossed the valve, Dr. Irish Lack suspected much of the gradient was subvalvular.  He is currently on ASA, Brilinta, and statin. He is not on BB due to history of bradycardia.   He is now here for followup today.  He is doing well.  He has not had any further chest pain.  He denies any SOB, DOE, LE edema, dizziness, palpitations or syncope.  He is tolerating his meds without any problems.  Repeat echo this month showed normal LVF with moderate to severe AS with mean AVG of 19mmHg.  He is wanting to get knee surgery in the near future and wants cardiac clearance.   Past Medical History:  Diagnosis Date  . Aortic stenosis    a. moderate by echo 03/2016; based on cath 03/2016 it is suspected that much of his gradient is subvalvular.  . Arthritis    "pain in my knees" (03/30/2016)  . CAD S/P percutaneous coronary angioplasty 03/31/2016   a. CP/USA -> LHC 03/31/16: 90% prox-mid RCA s/p DES, 40% prox-mid LAD, 25% D2, 25% prox-mid Cx, elevated LVEDP 33.  . Chronic diastolic CHF (congestive heart failure) ([place]) 03/31/2016  . Chronic lower back pain   . Dyslipidemia   . Heart murmur    "since I was a kid" (03/30/2016)  . LVH (left ventricular hypertrophy)    a. severe basal septal  hypertrophy by echo 03/2016.  . Mitral regurgitation    a. mild by echo 03/2016.  Marland Kitchen Psoriasis   . Rheumatic fever    as a child  . Seizure ([place]) 01/2011; 07/2011; 07/2015      "idiopathic; been on Kepra since 07/2011"  . Sinus bradycardia - baseline HR 50s at times.   . Sleep apnea   . 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 Thrombosed hemorrhoids     Past Surgical History:  Procedure Laterality Date  . APPENDECTOMY    . BACK SURGERY    . CARDIAC CATHETERIZATION N/A 03/30/2016   Procedure: Right/Left Heart Cath and Coronary Angiography;  Surgeon: Jettie Booze, MD;  Location: [facility];  Service: Cardiovascular;  Laterality: N/A;  . CARDIAC CATHETERIZATION N/A 03/30/2016   Procedure: Coronary Stent Intervention;  Surgeon: Jettie Booze, MD;  Location: [facility];  Service: Cardiovascular;  Laterality: N/A;  . CORONARY ANGIOPLASTY    . FOREARM FRACTURE SURGERY  ~ 2003-2004   "got steel plate in there"  . I&D hemorrhoirds    . KNEE ARTHROSCOPY Bilateral   . MOHS SURGERY Left    temple  . ORIF SCAPULAR FRACTURE Right    right  . POSTERIOR LUMBAR FUSION  2004  . TONSILLECTOMY      Current Medications:  Current Meds  Medication Sig  . acetaminophen (TYLENOL) 500 MG tablet Take 1,000 mg by mouth every 8 (eight) hours as needed.  Marland Kitchen aspirin 81 MG tablet Take 81 mg by mouth daily.  . clobetasol ointment (TEMOVATE) 5.80 % Apply 1 application topically as needed (skin irritation). Daily  . desonide (DESOWEN) 0.05 % cream Apply 1 application topically as needed.  . diphenhydrAMINE (BENADRYL) 2 % cream Apply topically as needed for itching.  Marland Kitchen HUMIRA PEN 40 MG/0.8ML PNKT Inject 40 mg into the skin every 21 ( twenty-one) days.   Marland Kitchen levETIRAcetam (KEPPRA) 750 MG tablet Take 1 tablet (750 mg total) by mouth 2 (two) times daily.  . nitroGLYCERIN (NITROSTAT) 0.4 MG SL tablet Place 1 tablet (0.4 mg total) under the tongue every 5 (five) minutes as needed for  chest pain (up to 3 doses).  Marland Kitchen PROCTOZONE-HC 2.5 % rectal cream Apply 1 application topically daily as needed for hemorrhoids or itching.   . rosuvastatin (CRESTOR) 20 MG tablet Take 1 tablet (20 mg total) by mouth daily.  . ticagrelor (BRILINTA) 90 MG TABS tablet Take 1 tablet (90 mg total) by mouth 2 (two) times daily.    Allergies:   Patient has no known allergies.   Social History   Social History  . Marital status: Married    Spouse name: N/A  . Number of children: N/A  . Years of education: N/A   Social History Main Topics  . Smoking status: Never Smoker  . Smokeless tobacco: Never Used  . Alcohol use 9.0 oz/week    10 Cans of beer, 5 Shots of liquor per week  . Drug use: No  . Sexual activity: Not Currently   Other Topics Concern  . None   Social History Narrative  . None     Family History:  The patient's family history includes Congestive Heart Failure in his father; Depression in his sister; Renal Disease in his mother.   ROS:   Please see the history of present illness.    ROS All other systems reviewed and are negative.  No flowsheet data found.     PHYSICAL EXAM:   VS:  BP (!) 160/78   Pulse 62   Ht 5' 5.5" (1.664 m)   Wt 174 lb 1.9 oz (79 kg)   SpO2 96%   BMI 28.53 kg/m    GEN: Well nourished, well developed, in no acute distress  HEENT: normal  Neck: no JVD, carotid bruits, or masses Cardiac: RRR; no rubs, or gallops,no edema.  Intact distal pulses bilaterally. 2/6 late peaking SM with loss of A2 component of seco Respiratory:  clear to auscultation bilaterally, normal work of breathing GI: soft, nontender, nondistended, + BS MS: no deformity or atrophy  Skin: warm and dry, no rash Neuro:  Alert and Oriented x 3, Strength and sensation are intact Psych: euthymic mood, full affect  Wt Readings from Last 3 Encounters:  10/25/16 174 lb 1.9 oz (79 kg)  08/31/16 175 lb 1 oz (79.4 kg)  08/13/16 180 lb 1.9 oz (81.7 kg)      Studies/Labs  Reviewed:   EKG:  EKG is not ordered today.    Recent Labs: 03/31/2016: Hemoglobin 13.7 08/06/2016: ALT 19 10/25/2016: BUN 13; Creatinine, Ser 0.99; Platelets 201; Potassium 4.2; Sodium 140   Lipid Panel    Component Value Date/Time   CHOL 130 08/06/2016 0831   TRIG 82 08/06/2016 0831   HDL 50 08/06/2016 0831   CHOLHDL 2.6 08/06/2016 0831  VLDL 16 08/06/2016 0831   LDLCALC 64 08/06/2016 0831    Additional studies/ records that were reviewed today include:  Echo report 10/2016 and cath from 03/2016    ASSESSMENT:    1. Coronary artery disease involving native coronary artery of native heart with angina pectoris ([place])   2. Aortic valve stenosis, etiology of cardiac valve disease unspecified   3. Bradycardia, drug induced   4. Dyslipidemia      PLAN:  In order of problems listed above:  1. Aortic stenosis - moderate on echo 03/2016 but valve easily crossed at cath. It was felt that most of the gradient was subvalvular possible from a subaortic membrane.  Repeat echo this month showed moderate to severe AS with mean AVG of 65mmHg.  I have recommended that we get a TEE to further assess his AV to determine if the gradient is subvalvular and if so the etiology.  This will also allow [place] to also planimeter the AV for AVA.  Risks and benefits of TEE have been discussed, including the risk of anesthesia, esophageal perforation, bleeding, infection and death.  He understands and agrees to proceed.  2. ASCAD with cath 03/2016 for unstable angina showing 90% prox-mid RCA, 40% prox-mid LAD, 25% D2, 25% prox-mid Cx, elevated LVEDP of 33. He is s/p PCI of the RCA.   He has not had any further anginal symptoms.  He will continue ASA/Brilinta/statin.  3. Sinus bradycardia - this has resolved  4.   Dyslipidemia - LDL goal < 70.  Continue statin therapy. I will check an FLP and ALT.   Medication Adjustments/Labs and Tests Ordered: Current medicines are reviewed at length with the patient today.   Concerns regarding medicines are outlined above.  Medication changes, Labs and Tests ordered today are listed in the Patient Instructions below.  Patient Instructions  Medication Instructions:  Your physician recommends that you continue on your current medications as directed. Please refer to the Current Medication list given to you today.   Labwork: TODAY: pre-procedure labs  Testing/Procedures: Your physician has requested that you have a TEE on Friday, March 23. During a TEE, sound waves are used to create images of your heart. It provides your doctor with information about the size and shape of your heart and how well your heart's chambers and valves are working. In this test, a transducer is attached to the end of a flexible tube that's guided down your throat and into your esophagus (the tube leading from you mouth to your stomach) to get a more detailed image of your heart. You are not awake for the procedure. Please see the instruction sheet given to you today. For further information please visit [email or web address].  Follow-Up: Your physician wants you to follow-up in: 6 months with Dr. Radford Pax. You will receive a reminder letter in the mail two months in advance. If you don't receive a letter, please call our office to schedule the follow-up appointment.   Any Other Special Instructions Will Be Listed Below (If Applicable).     If you need a refill on your cardiac medications before your next appointment, please call your pharmacy.      Signed, Fransico Him, MD  10/28/2016 9:47 PM    [address], [place], [place]  [address] Phone: [phone]; Fax: [phone]

## 2016-10-26 NOTE — Progress Notes (Signed)
TEE originally scheduled 3/23 with Dr. Radford Pax.  Patient called, stated it needs to be rescheduled. Rescheduled TEE for 3/19 with Dr. Sallyanne Kuster. SchedulerJocelyn Lamer  Case # [id number]

## 2016-10-28 ENCOUNTER — Ambulatory Visit (INDEPENDENT_AMBULATORY_CARE_PROVIDER_SITE_OTHER): Payer: 59 | Admitting: Psychiatry

## 2016-10-28 DIAGNOSIS — F4323 Adjustment disorder with mixed anxiety and depressed mood: Secondary | ICD-10-CM | POA: Diagnosis not present

## 2016-10-28 DIAGNOSIS — R001 Bradycardia, unspecified: Secondary | ICD-10-CM | POA: Insufficient documentation

## 2016-10-28 DIAGNOSIS — T50905A Adverse effect of unspecified drugs, medicaments and biological substances, initial encounter: Secondary | ICD-10-CM

## 2016-10-28 DIAGNOSIS — I25119 Atherosclerotic heart disease of native coronary artery with unspecified angina pectoris: Secondary | ICD-10-CM | POA: Insufficient documentation

## 2016-11-01 ENCOUNTER — Ambulatory Visit (HOSPITAL_BASED_OUTPATIENT_CLINIC_OR_DEPARTMENT_OTHER)
Admission: RE | Admit: 2016-11-01 | Discharge: 2016-11-01 | Disposition: A | Discharge status: Home / Self Care | Payer: Medicare Other | Source: Ambulatory Visit | Attending: Cardiovascular Disease | Admitting: Cardiovascular Disease

## 2016-11-01 ENCOUNTER — Ambulatory Visit (HOSPITAL_COMMUNITY)
Admission: RE | Admit: 2016-11-01 | Discharge: 2016-11-01 | Disposition: A | Discharge status: Home / Self Care | Payer: Medicare Other | Source: Ambulatory Visit | Attending: Cardiovascular Disease | Admitting: Cardiovascular Disease

## 2016-11-01 ENCOUNTER — Encounter (HOSPITAL_COMMUNITY)
Admission: RE | Disposition: A | Discharge status: Home / Self Care | Payer: Self-pay | Source: Ambulatory Visit | Attending: Cardiovascular Disease

## 2016-11-01 ENCOUNTER — Encounter (HOSPITAL_COMMUNITY): Payer: Self-pay | Admitting: *Deleted

## 2016-11-01 DIAGNOSIS — I350 Nonrheumatic aortic (valve) stenosis: Secondary | ICD-10-CM | POA: Diagnosis not present

## 2016-11-01 DIAGNOSIS — Z79899 Other long term (current) drug therapy: Secondary | ICD-10-CM | POA: Diagnosis not present

## 2016-11-01 DIAGNOSIS — G40909 Epilepsy, unspecified, not intractable, without status epilepticus: Secondary | ICD-10-CM | POA: Insufficient documentation

## 2016-11-01 DIAGNOSIS — Z7902 Long term (current) use of antithrombotics/antiplatelets: Secondary | ICD-10-CM | POA: Diagnosis not present

## 2016-11-01 DIAGNOSIS — E785 Hyperlipidemia, unspecified: Secondary | ICD-10-CM | POA: Diagnosis not present

## 2016-11-01 DIAGNOSIS — I5032 Chronic diastolic (congestive) heart failure: Secondary | ICD-10-CM | POA: Diagnosis not present

## 2016-11-01 DIAGNOSIS — I2510 Atherosclerotic heart disease of native coronary artery without angina pectoris: Secondary | ICD-10-CM | POA: Diagnosis not present

## 2016-11-01 DIAGNOSIS — L409 Psoriasis, unspecified: Secondary | ICD-10-CM | POA: Insufficient documentation

## 2016-11-01 DIAGNOSIS — Z7982 Long term (current) use of aspirin: Secondary | ICD-10-CM | POA: Insufficient documentation

## 2016-11-01 DIAGNOSIS — I352 Nonrheumatic aortic (valve) stenosis with insufficiency: Secondary | ICD-10-CM | POA: Diagnosis not present

## 2016-11-01 DIAGNOSIS — Z955 Presence of coronary angioplasty implant and graft: Secondary | ICD-10-CM | POA: Insufficient documentation

## 2016-11-01 HISTORY — PX: TEE WITHOUT CARDIOVERSION: SHX5443

## 2016-11-01 SURGERY — ECHOCARDIOGRAM, TRANSESOPHAGEAL
Anesthesia: Moderate Sedation

## 2016-11-01 MED ORDER — FENTANYL CITRATE (PF) 100 MCG/2ML IJ SOLN
INTRAMUSCULAR | Status: AC
  Filled 2016-11-01: qty 2

## 2016-11-01 MED ORDER — FENTANYL CITRATE (PF) 100 MCG/2ML IJ SOLN
INTRAMUSCULAR | Status: DC | PRN
  Administered 2016-11-01 (×4): 25 ug via INTRAVENOUS

## 2016-11-01 MED ORDER — MIDAZOLAM HCL 5 MG/ML IJ SOLN
INTRAMUSCULAR | Status: AC
  Filled 2016-11-01: qty 2

## 2016-11-01 MED ORDER — SODIUM CHLORIDE 0.9 % IV SOLN
INTRAVENOUS | Status: DC
Start: 2016-11-01 — End: 2016-11-01
  Administered 2016-11-01: 11:00:00 via INTRAVENOUS

## 2016-11-01 MED ORDER — DIPHENHYDRAMINE HCL 50 MG/ML IJ SOLN
INTRAMUSCULAR | Status: AC
  Filled 2016-11-01: qty 1

## 2016-11-01 MED ORDER — MIDAZOLAM HCL 10 MG/2ML IJ SOLN
INTRAMUSCULAR | Status: DC | PRN
  Administered 2016-11-01 (×4): 2 mg via INTRAVENOUS

## 2016-11-01 NOTE — H&P (View-Only) (Signed)
Cardiology Office Note    Date:  10/28/2016   ID:  SHENOUDA GENOVA, DOB 1945-11-28, MRN [id number]  PCP:  Melinda Crutch, MD  Cardiologist:  Fransico Him, MD   Chief Complaint  Patient presents with  . Aortic Stenosis  . Coronary Artery Disease    History of Present Illness:  Douglas Lane is a 71 y.o. male with dyslipidemia, seizure disorder, sleep apnea, arthritis, hemorrhoids,rheumatic fever as a child, and moderate AS/mild MR, severe basal septal hypertrophy &diastolic dysfunction by echo 03/19/16 and ASCAD with  90% prox-mid RCA s/p DES, 40% prox-mid LAD, 25% D2, 25% prox-mid Cx, elevated LVEDP of 33 by cath 03/2016. There was a large gradient across the aortic valve, but based on the echo and how easily the catheter crossed the valve, Dr. Irish Lack suspected much of the gradient was subvalvular.  He is currently on ASA, Brilinta, and statin. He is not on BB due to history of bradycardia.   He is now here for followup today.  He is doing well.  He has not had any further chest pain.  He denies any SOB, DOE, LE edema, dizziness, palpitations or syncope.  He is tolerating his meds without any problems.  Repeat echo this month showed normal LVF with moderate to severe AS with mean AVG of 58mmHg.  He is wanting to get knee surgery in the near future and wants cardiac clearance.   Past Medical History:  Diagnosis Date  . Aortic stenosis    a. moderate by echo 03/2016; based on cath 03/2016 it is suspected that much of his gradient is subvalvular.  . Arthritis    "pain in my knees" (03/30/2016)  . CAD S/P percutaneous coronary angioplasty 03/31/2016   a. CP/USA -> LHC 03/31/16: 90% prox-mid RCA s/p DES, 40% prox-mid LAD, 25% D2, 25% prox-mid Cx, elevated LVEDP 33.  . Chronic diastolic CHF (congestive heart failure) ([place]) 03/31/2016  . Chronic lower back pain   . Dyslipidemia   . Heart murmur    "since I was a kid" (03/30/2016)  . LVH (left ventricular hypertrophy)    a. severe basal septal  hypertrophy by echo 03/2016.  . Mitral regurgitation    a. mild by echo 03/2016.  Marland Kitchen Psoriasis   . Rheumatic fever    as a child  . Seizure ([place]) 01/2011; 07/2011; 07/2015      "idiopathic; been on Kepra since 07/2011"  . Sinus bradycardia - baseline HR 50s at times.   . Sleep apnea   . 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 Thrombosed hemorrhoids     Past Surgical History:  Procedure Laterality Date  . APPENDECTOMY    . BACK SURGERY    . CARDIAC CATHETERIZATION N/A 03/30/2016   Procedure: Right/Left Heart Cath and Coronary Angiography;  Surgeon: Jettie Booze, MD;  Location: [facility];  Service: Cardiovascular;  Laterality: N/A;  . CARDIAC CATHETERIZATION N/A 03/30/2016   Procedure: Coronary Stent Intervention;  Surgeon: Jettie Booze, MD;  Location: [facility];  Service: Cardiovascular;  Laterality: N/A;  . CORONARY ANGIOPLASTY    . FOREARM FRACTURE SURGERY  ~ 2003-2004   "got steel plate in there"  . I&D hemorrhoirds    . KNEE ARTHROSCOPY Bilateral   . MOHS SURGERY Left    temple  . ORIF SCAPULAR FRACTURE Right    right  . POSTERIOR LUMBAR FUSION  2004  . TONSILLECTOMY      Current Medications:  Current Meds  Medication Sig  . acetaminophen (TYLENOL) 500 MG tablet Take 1,000 mg by mouth every 8 (eight) hours as needed.  Marland Kitchen aspirin 81 MG tablet Take 81 mg by mouth daily.  . clobetasol ointment (TEMOVATE) 7.61 % Apply 1 application topically as needed (skin irritation). Daily  . desonide (DESOWEN) 0.05 % cream Apply 1 application topically as needed.  . diphenhydrAMINE (BENADRYL) 2 % cream Apply topically as needed for itching.  Marland Kitchen HUMIRA PEN 40 MG/0.8ML PNKT Inject 40 mg into the skin every 21 ( twenty-one) days.   Marland Kitchen levETIRAcetam (KEPPRA) 750 MG tablet Take 1 tablet (750 mg total) by mouth 2 (two) times daily.  . nitroGLYCERIN (NITROSTAT) 0.4 MG SL tablet Place 1 tablet (0.4 mg total) under the tongue every 5 (five) minutes as needed for  chest pain (up to 3 doses).  Marland Kitchen PROCTOZONE-HC 2.5 % rectal cream Apply 1 application topically daily as needed for hemorrhoids or itching.   . rosuvastatin (CRESTOR) 20 MG tablet Take 1 tablet (20 mg total) by mouth daily.  . ticagrelor (BRILINTA) 90 MG TABS tablet Take 1 tablet (90 mg total) by mouth 2 (two) times daily.    Allergies:   Patient has no known allergies.   Social History   Social History  . Marital status: Married    Spouse name: N/A  . Number of children: N/A  . Years of education: N/A   Social History Main Topics  . Smoking status: Never Smoker  . Smokeless tobacco: Never Used  . Alcohol use 9.0 oz/week    10 Cans of beer, 5 Shots of liquor per week  . Drug use: No  . Sexual activity: Not Currently   Other Topics Concern  . None   Social History Narrative  . None     Family History:  The patient's family history includes Congestive Heart Failure in his father; Depression in his sister; Renal Disease in his mother.   ROS:   Please see the history of present illness.    ROS All other systems reviewed and are negative.  No flowsheet data found.     PHYSICAL EXAM:   VS:  BP (!) 160/78   Pulse 62   Ht 5' 5.5" (1.664 m)   Wt 174 lb 1.9 oz (79 kg)   SpO2 96%   BMI 28.53 kg/m    GEN: Well nourished, well developed, in no acute distress  HEENT: normal  Neck: no JVD, carotid bruits, or masses Cardiac: RRR; no rubs, or gallops,no edema.  Intact distal pulses bilaterally. 2/6 late peaking SM with loss of A2 component of seco Respiratory:  clear to auscultation bilaterally, normal work of breathing GI: soft, nontender, nondistended, + BS MS: no deformity or atrophy  Skin: warm and dry, no rash Neuro:  Alert and Oriented x 3, Strength and sensation are intact Psych: euthymic mood, full affect  Wt Readings from Last 3 Encounters:  10/25/16 174 lb 1.9 oz (79 kg)  08/31/16 175 lb 1 oz (79.4 kg)  08/13/16 180 lb 1.9 oz (81.7 kg)      Studies/Labs  Reviewed:   EKG:  EKG is not ordered today.    Recent Labs: 03/31/2016: Hemoglobin 13.7 08/06/2016: ALT 19 10/25/2016: BUN 13; Creatinine, Ser 0.99; Platelets 201; Potassium 4.2; Sodium 140   Lipid Panel    Component Value Date/Time   CHOL 130 08/06/2016 0831   TRIG 82 08/06/2016 0831   HDL 50 08/06/2016 0831   CHOLHDL 2.6 08/06/2016 0831  VLDL 16 08/06/2016 0831   LDLCALC 64 08/06/2016 0831    Additional studies/ records that were reviewed today include:  Echo report 10/2016 and cath from 03/2016    ASSESSMENT:    1. Coronary artery disease involving native coronary artery of native heart with angina pectoris ([place])   2. Aortic valve stenosis, etiology of cardiac valve disease unspecified   3. Bradycardia, drug induced   4. Dyslipidemia      PLAN:  In order of problems listed above:  1. Aortic stenosis - moderate on echo 03/2016 but valve easily crossed at cath. It was felt that most of the gradient was subvalvular possible from a subaortic membrane.  Repeat echo this month showed moderate to severe AS with mean AVG of 64mmHg.  I have recommended that we get a TEE to further assess his AV to determine if the gradient is subvalvular and if so the etiology.  This will also allow [place] to also planimeter the AV for AVA.  Risks and benefits of TEE have been discussed, including the risk of anesthesia, esophageal perforation, bleeding, infection and death.  He understands and agrees to proceed.  2. ASCAD with cath 03/2016 for unstable angina showing 90% prox-mid RCA, 40% prox-mid LAD, 25% D2, 25% prox-mid Cx, elevated LVEDP of 33. He is s/p PCI of the RCA.   He has not had any further anginal symptoms.  He will continue ASA/Brilinta/statin.  3. Sinus bradycardia - this has resolved  4.   Dyslipidemia - LDL goal < 70.  Continue statin therapy. I will check an FLP and ALT.   Medication Adjustments/Labs and Tests Ordered: Current medicines are reviewed at length with the patient today.   Concerns regarding medicines are outlined above.  Medication changes, Labs and Tests ordered today are listed in the Patient Instructions below.  Patient Instructions  Medication Instructions:  Your physician recommends that you continue on your current medications as directed. Please refer to the Current Medication list given to you today.   Labwork: TODAY: pre-procedure labs  Testing/Procedures: Your physician has requested that you have a TEE on Friday, March 23. During a TEE, sound waves are used to create images of your heart. It provides your doctor with information about the size and shape of your heart and how well your heart's chambers and valves are working. In this test, a transducer is attached to the end of a flexible tube that's guided down your throat and into your esophagus (the tube leading from you mouth to your stomach) to get a more detailed image of your heart. You are not awake for the procedure. Please see the instruction sheet given to you today. For further information please visit [email or web address].  Follow-Up: Your physician wants you to follow-up in: 6 months with Dr. Radford Pax. You will receive a reminder letter in the mail two months in advance. If you don't receive a letter, please call our office to schedule the follow-up appointment.   Any Other Special Instructions Will Be Listed Below (If Applicable).     If you need a refill on your cardiac medications before your next appointment, please call your pharmacy.      Signed, Fransico Him, MD  10/28/2016 9:47 PM    [address], [place], [place]  [address] Phone: [phone]; Fax: [phone]

## 2016-11-01 NOTE — Op Note (Signed)
INDICATIONS: aortic stenosis  PROCEDURE:   Informed consent was obtained prior to the procedure. The risks, benefits and alternatives for the procedure were discussed and the patient comprehended these risks.  Risks include, but are not limited to, cough, sore throat, vomiting, nausea, somnolence, esophageal and stomach trauma or perforation, bleeding, low blood pressure, aspiration, pneumonia, infection, trauma to the teeth and death.    After a procedural time-out, the oropharynx was anesthetized with 20% benzocaine spray.   During this procedure the patient was administered a total of Versed 8 mg and Fentanyl 100 mcg to achieve and maintain moderate conscious sedation.  The patient's heart rate, blood pressure, and oxygen saturationweare monitored continuously during the procedure. The period of conscious sedation was 19 minutes, of which I was present face-to-face 100% of this time.  The transesophageal probe was inserted in the esophagus and stomach without difficulty and multiple views were obtained.  The patient was kept under observation until the patient left the procedure room.  The patient left the procedure room in stable condition.   Agitated microbubble saline contrast was not administered.  COMPLICATIONS:    There were no immediate complications.  FINDINGS:   Severe aortic stenosis.  Mild-moderate aortic insufficiency.  RECOMMENDATIONS:     Consider SAVR/TAVR.  Time Spent Directly with the Patient:  30 minutes   Douglas Lane 11/01/2016, 12:05 PM

## 2016-11-01 NOTE — Discharge Instructions (Signed)

## 2016-11-01 NOTE — Interval H&P Note (Signed)
History and Physical Interval Note:  11/01/2016 11:38 AM  Douglas Lane  has presented today for surgery, with the diagnosis of AORTIC STENOSIS  The various methods of treatment have been discussed with the patient and family. After consideration of risks, benefits and other options for treatment, the patient has consented to  Procedure(s): TRANSESOPHAGEAL ECHOCARDIOGRAM (TEE) (N/A) as a surgical intervention .  The patient's history has been reviewed, patient examined, no change in status, stable for surgery.  I have reviewed the patient's chart and labs.  Questions were answered to the patient's satisfaction.     [name]

## 2016-11-02 ENCOUNTER — Encounter: Payer: Self-pay | Admitting: Cardiology

## 2016-11-02 ENCOUNTER — Encounter (HOSPITAL_COMMUNITY): Payer: Self-pay | Admitting: Cardiovascular Disease

## 2016-11-03 ENCOUNTER — Ambulatory Visit (INDEPENDENT_AMBULATORY_CARE_PROVIDER_SITE_OTHER): Payer: 59 | Admitting: Psychiatry

## 2016-11-03 DIAGNOSIS — F4323 Adjustment disorder with mixed anxiety and depressed mood: Secondary | ICD-10-CM

## 2016-11-05 ENCOUNTER — Encounter: Payer: Self-pay | Admitting: Cardiology

## 2016-11-08 ENCOUNTER — Other Ambulatory Visit: Payer: Self-pay

## 2016-11-08 DIAGNOSIS — I350 Nonrheumatic aortic (valve) stenosis: Secondary | ICD-10-CM

## 2016-11-10 ENCOUNTER — Encounter: Payer: Self-pay | Admitting: Cardiology

## 2016-11-10 ENCOUNTER — Encounter: Payer: Self-pay | Admitting: Neurology

## 2016-11-11 ENCOUNTER — Ambulatory Visit (INDEPENDENT_AMBULATORY_CARE_PROVIDER_SITE_OTHER): Payer: 59 | Admitting: Psychiatry

## 2016-11-11 DIAGNOSIS — F4323 Adjustment disorder with mixed anxiety and depressed mood: Secondary | ICD-10-CM

## 2016-11-22 ENCOUNTER — Encounter: Payer: Self-pay | Admitting: Surgery

## 2016-11-22 ENCOUNTER — Institutional Professional Consult (permissible substitution) (INDEPENDENT_AMBULATORY_CARE_PROVIDER_SITE_OTHER): Payer: Medicare Other | Admitting: Surgery

## 2016-11-22 VITALS — BP 135/79 | HR 67 | Resp 20 | Ht 67.0 in | Wt 170.0 lb

## 2016-11-22 DIAGNOSIS — I350 Nonrheumatic aortic (valve) stenosis: Secondary | ICD-10-CM

## 2016-11-22 NOTE — Progress Notes (Signed)
Cardiothoracic Surgery Consultation   PCP is Duane Lope, MD Referring Provider is Quintella Reichert, MD  Chief Complaint  Patient presents with  . Aortic Stenosis    Surgical eval, ECHO 10/21/16, TEE 11/01/16, Cardiac Cath 03/30/16    HPI:  The patient is a 71 year old gentleman with dyslipidemia, CAD s/p PCI of a 90% proximal to mid RCA with DES in 03/2016, and know aortic stenosis. Prior to his cath and PCI he presented with exertional chest discomfort and shortness of breath. An echo in 02/2015 has shown moderate AS with a mean gradient of 31 mm Hg. He had a gated nuclear stress test on 03/19/2016 which was an intermediate risk study with an inferior infarct and no reversible ischemia prompting the cath and subsequent PCI. He also had 40% diffuse proximal LAD narrowing and 50% ostial PDA stenosis. The mean AV gradient was 41.3 mm Hg with an AVA of 0.85. The valve was easy to cross so there was some concern about the gradient being in the subvalvular region. His 2D echo on 03/19/2016 had shown a mean gradient of 34 mm Hg with an LVOT peak gradient of only 3 mm Hg. He has done well since his PCI. He says that he has occasional exertional chest pressure but nothing like before. He has some fatigue and shortness of breath with exertion such as walking up hills or walking his wild dog but he stops and it resolves quickly. He has had no dizziness or syncope. He had a repeat 2D echo on 10/21/2016 showing a mean gradient of 34 mm Hg and a DI of 0.22. The AV is thickened and calcified with restricted motion. A TEE was performed and this showed severe AS with a mean gradient of 53 mm Hg and a peak of 92 mm Hg. There was mild AI. The LVOT peak gradient was only 4 mm Hg. There was no subvalvular obstruction. The LVEF was normal at 55-60%.  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 He is married and lives with his wife. He can still do work around [employer] and  mow his yard. He walks his dog. He has been under a fair amount of stress taking care of his wife's parents who are over 26 years old.  Past Medical History:  Diagnosis Date  . Aortic stenosis    a. moderate by echo 03/2016; based on cath 03/2016 it is suspected that much of his gradient is subvalvular.  . Arthritis    "pain in my knees" (03/30/2016)  . CAD S/P percutaneous coronary angioplasty 03/31/2016   a. CP/USA -> LHC 03/31/16: 90% prox-mid RCA s/p DES, 40% prox-mid LAD, 25% D2, 25% prox-mid Cx, elevated LVEDP 33.  . Chronic diastolic CHF (congestive heart failure) (HCC) 03/31/2016  . Chronic lower back pain   . Dyslipidemia   . Heart murmur    "since I was a kid" (03/30/2016)  . LVH (left ventricular hypertrophy)    a. severe basal septal hypertrophy by echo 03/2016.  . Mitral regurgitation    a. mild by echo 03/2016.  Marland Kitchen Psoriasis   . Rheumatic fever    as a child  . 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 Sinus bradycardia - baseline HR 50s at times.   . Sleep apnea   . 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  Thrombosed hemorrhoids     Past Surgical History:  Procedure Laterality Date  . APPENDECTOMY    . BACK SURGERY    . CARDIAC CATHETERIZATION N/A 03/30/2016   Procedure: Right/Left Heart Cath and Coronary Angiography;  Surgeon: Corky Crafts, MD;  Location: [facility] INVASIVE CV LAB;  Service: Cardiovascular;  Laterality: N/A;  . CARDIAC CATHETERIZATION N/A 03/30/2016   Procedure: Coronary Stent Intervention;  Surgeon: Corky Crafts, MD;  Location: [facility] INVASIVE CV LAB;  Service: Cardiovascular;  Laterality: N/A;  . CORONARY ANGIOPLASTY    . FOREARM FRACTURE SURGERY  ~ 2003-2004   "got steel plate in there"  . I&D hemorrhoirds    . KNEE ARTHROSCOPY Bilateral   . MOHS SURGERY Left    temple  . ORIF SCAPULAR FRACTURE Right    right  . POSTERIOR LUMBAR FUSION  2004  . TEE WITHOUT CARDIOVERSION N/A 11/01/2016    Procedure: TRANSESOPHAGEAL ECHOCARDIOGRAM (TEE);  Surgeon: Thurmon Fair, MD;  Location: [facility] ENDOSCOPY;  Service: Cardiovascular;  Laterality: N/A;  . TONSILLECTOMY      Family History  Problem Relation Age of Onset  . Renal Disease Mother   . Congestive Heart Failure Father   . Depression Sister     Social History Social History  Substance Use Topics  . Smoking status: Never Smoker  . Smokeless tobacco: Never Used  . Alcohol use 9.0 oz/week    10 Cans of beer, 5 Shots of liquor per week    Current Outpatient Prescriptions  Medication Sig Dispense Refill  . acetaminophen (TYLENOL) 500 MG tablet Take 1,000 mg by mouth every 8 (eight) hours as needed.    Marland Kitchen aspirin 81 MG tablet Take 81 mg by mouth daily.    . clobetasol ointment (TEMOVATE) 0.05 % Apply 1 application topically as needed (skin irritation). Daily  1  . desonide (DESOWEN) 0.05 % cream Apply 1 application topically as needed.    . diphenhydrAMINE (BENADRYL) 2 % cream Apply topically as needed for itching.    Marland Kitchen HUMIRA PEN 40 MG/0.8ML PNKT Inject 40 mg into the skin every 28 (twenty-eight) days.     Marland Kitchen levETIRAcetam (KEPPRA) 750 MG tablet Take 1 tablet (750 mg total) by mouth 2 (two) times daily. 180 tablet 3  . nitroGLYCERIN (NITROSTAT) 0.4 MG SL tablet Place 1 tablet (0.4 mg total) under the tongue every 5 (five) minutes as needed for chest pain (up to 3 doses). 25 tablet 3  . PROCTOZONE-HC 2.5 % rectal cream Apply 1 application topically daily as needed for hemorrhoids or itching.     . rosuvastatin (CRESTOR) 20 MG tablet Take 1 tablet (20 mg total) by mouth daily. 30 tablet 11  . ticagrelor (BRILINTA) 90 MG TABS tablet Take 1 tablet (90 mg total) by mouth 2 (two) times daily. 180 tablet 3   No current facility-administered medications for this visit.     No Known Allergies  Review of Systems  Constitutional: Positive for activity change and fatigue. Negative for appetite change, chills, fever and unexpected weight  change.  HENT: Negative.        Sees his dentist every six months  Eyes:       Floaters  Respiratory: Positive for shortness of breath.   Cardiovascular: Negative for palpitations and leg swelling.       Occasional chest tightness  Gastrointestinal: Negative.   Endocrine: Negative.   Genitourinary: Negative.   Musculoskeletal: Positive for arthralgias and joint swelling.  Skin: Negative.   Allergic/Immunologic: Negative.  Neurological: Positive for seizures. Negative for dizziness and syncope.  Hematological: Bruises/bleeds easily.       Since on Brilinta  Psychiatric/Behavioral:       A lot of family stress    BP 135/79   Pulse 67   Resp 20   Ht 5\' 7"  (1.702 m)   Wt 170 lb (77.1 kg)   SpO2 96%   BMI 26.63 kg/m  Physical Exam  Constitutional: He is oriented to person, place, and time. He appears well-developed and well-nourished. No distress.  HENT:  Head: Normocephalic and atraumatic.  Mouth/Throat: Oropharynx is clear and moist.  Teeth in good condition  Eyes: Conjunctivae are normal. Pupils are equal, round, and reactive to light.  Neck: Normal range of motion. Neck supple. No JVD present. No thyromegaly present.  Cardiovascular: Normal rate, regular rhythm and intact distal pulses.   Murmur heard. 3/6 systolic murmur RSB, soft S2  Pulmonary/Chest: Effort normal and breath sounds normal. No respiratory distress. He has no wheezes. He has no rales.  Abdominal: Soft. Bowel sounds are normal. He exhibits no distension and no mass. There is no tenderness.  Musculoskeletal: He exhibits deformity.  Both knees  Lymphadenopathy:    He has no cervical adenopathy.  Neurological: He is alert and oriented to person, place, and time. He has normal strength. No cranial nerve deficit or sensory deficit.  Skin: Skin is warm and dry.  Psychiatric: He has a normal mood and affect.     Diagnostic Tests:   LUE MAYEAUX  Cardiac catheterization  Order# [id number]  Reading  physician: Corky Crafts, MD Ordering physician: Corky Crafts, MD Study date: 03/30/16  Physicians   Panel Physicians Referring Physician Case Authorizing Physician  Corky Crafts, MD (Primary)    Procedures   Coronary Stent Intervention  Right/Left Heart Cath and Coronary Angiography  Conclusion     Prox RCA to Mid RCA lesion, 90 %stenosed. A STENT SYNERGY DES [id number] drug eluting stent was successfully placed.  Post intervention, there is a 0% residual stenosis.  LV end diastolic pressure is moderately elevated.  There is moderate aortic valve stenosis. There is a large gradient across the aortic valve, but based on the echo and how easily the catheter crossed the valve, I suspect much of the gradient is subvalvular.  There is no pulmonic valve stenosis.  Cardiac index 2.6. Normal pulmonary artery pressures.   Continue dual antiplatelet therapy. He is a candidate for the TWILIGHT study.  I spoke with Dr. Mayford Knife during the case regarding the aortic stenosis. There is a large gradient present but is mostly subvalvular most likely. The pigtail catheter in JR catheter across the valve without even a wire.  He'll need diuresis as well to help with his elevated LVEDP.   Indications   Abnormal nuclear stress test [R94.39 (ICD-10-CM)]  Aortic stenosis [I35.0 (ICD-10-CM)]  Procedural Details/Technique   Technical Details The risks, benefits, and details of the procedure were explained to the patient. The patient verbalized understanding and wanted to proceed. Informed written consent was obtained.  PROCEDURE TECHNIQUE: After Xylocaine anesthesia, a 7 French sheath was placed in the right common femoral vein. A 7 French balloontipped Swan-Ganz catheter was advanced to the pulmonary artery under fluoroscopic guidance. Hemodynamic pressures were obtained. Oxygen saturations were obtained. After Xylocaine anesthesia, a 25F sheath was placed in the right radial artery  with a single anterior needle wall stick. Right coronary angiography was done using a Judkins R4 guide catheter. Left coronary  angiography was done using a Judkins L3.5 guide catheter. Left heart cath was done using a pigtail catheter.   IV Heparin and tirofiban were given for anticoagulation. Oral Brilinta was given at the end of the case. A JR4 guide catheter was used to engage the RCA. A pro-water wire was placed across the area disease. A 2.25 balloon was used to predilate. A 3.0 x 38 Synergy drug-eluting stent was deployed at high pressure. A 3.5 noncompliant balloon was used to post dilate. There is an excellent angiographic result. There is no residual stenosis.      Contrast: 100     Estimated blood loss <50 mL. . During this procedure the patient was administered the following to achieve and maintain moderate conscious sedation: Versed 4 mg, Fentanyl 75 mcg, while the patient's heart rate, blood pressure, and oxygen saturation were continuously monitored. The period of conscious sedation was 100 minutes, of which I was present face-to-face 100% of this time.    Complications   Complications documented before study signed (03/30/2016 10:23 AM EDT)    No complications were associated with this study.  Documented by Corky Crafts, MD - 03/30/2016 10:13 AM EDT    Coronary Findings   Dominance: Right  Left Anterior Descending  Prox LAD to Mid LAD lesion, 40% stenosed.  Second Diagonal [employer] 2nd Diag to 2nd Diag lesion, 25% stenosed.  Left Circumflex  Ost Cx to Prox Cx lesion, 25% stenosed.  Right Coronary Artery  Prox RCA to Mid RCA lesion, 90% stenosed.  Angioplasty: Pre-stent angioplasty was performed using a BALLOON EMERGE MR 2.25X20. Maximum pressure: 14 atm. A STENT SYNERGY DES [id number] drug eluting stent was successfully placed. Stent strut is well apposed. Post-stent angioplasty was performed using a BALLOON [place] EMERGE MR 3.5X20. Maximum pressure: 18 atm. The  pre-interventional distal flow is normal (TIMI 3). The post-interventional distal flow is normal (TIMI 3). The intervention was successful . No complications occurred at this lesion.  There is no residual stenosis post intervention.  Right Posterior Descending Artery  RPDA lesion, 50% stenosed.  Right Heart   Right Heart Pressures Elevated LV EDP consistent with volume overload.    Right Atrium Right atrial pressure is elevated.    Pulmonic Valve There is no pulmonic valve stenosis.    Left Heart   Left Ventricle LV end diastolic pressure is moderately elevated.    Aortic Valve There is moderate aortic valve stenosis. There is a large gradient across the aortic valve, but based on the echo and how easily the catheter crossed the valve, I suspect much of the gradient is subvalvular.    Coronary Diagrams   Diagnostic Diagram       Post-Intervention Diagram       Implants     Permanent Stent  Stent Synergy Des 3x38 - [id number] - Implanted    Inventory item: Stent Synergy Des 3x38 Model/Cat number: [id number]  Manufacturer: [employer] Lot number: [id number]  Device identifier: [id number] Device identifier type: GS1  GUDID Information   Request status Successful    Brand name: SYNERGYT Version/Model: [id number]  Company name: [employer] MRI safety info as of 03/30/16: MR Conditional  Contains dry or latex rubber: No    GMDN P.T. name: Coronary angioplasty balloon catheter, basic    As of 03/30/2016   Status: Implanted      PACS Images   Show images for Cardiac catheterization   Link to Procedure Log   Procedure Log  Hemo Data    Most Recent Value  Fick Cardiac Output 5.17 L/min  Fick Cardiac Output Index 2.68 (L/min)/BSA  Aortic Mean Gradient 41.8 mmHg  Aortic Peak Gradient 53 mmHg  Aortic Valve Area 0.85  Aortic Value Area Index 0.44 cm2/BSA  RA A Wave 11 mmHg  RA V Wave 11 mmHg  RA Mean 9 mmHg  RV Systolic  Pressure 29 mmHg  RV Diastolic Pressure 3 mmHg  RV EDP 9 mmHg  PA Systolic Pressure 30 mmHg  PA Diastolic Pressure 8 mmHg  PA Mean 17 mmHg  PW A Wave 24 mmHg  PW V Wave 21 mmHg  PW Mean 17 mmHg  AO Systolic Pressure 131 mmHg  AO Diastolic Pressure 69 mmHg  AO Mean 96 mmHg  LV Systolic Pressure 180 mmHg  LV Diastolic Pressure 19 mmHg  LV EDP 33 mmHg  Arterial Occlusion Pressure Extended Systolic Pressure 129 mmHg  Arterial Occlusion Pressure Extended Diastolic Pressure 70 mmHg  Arterial Occlusion Pressure Extended Mean Pressure 95 mmHg  Left Ventricular Apex Extended Systolic Pressure 182 mmHg  Left Ventricular Apex Extended Diastolic Pressure 11 mmHg  Left Ventricular Apex Extended EDP Pressure 27 mmHg  QP/QS 1  TPVR Index 6.34 HRUI  TSVR Index 35.81 HRUI  TPVR/TSVR Ratio 0.18              * [facility]*                        [address]                        [place], [place] [address]                            [phone]  ------------------------------------------------------------------- Transthoracic Echocardiography  Patient:    Deiontay, Papendick MR #:       [id number] Study Date: 10/21/2016 Gender:     M Age:        70 Height:     166.4 cm Weight:     79.4 kg BSA:        1.94 m^2 Pt. Status: Room:   ORDERING     Armanda Magic, MD  REFERRING    Armanda Magic, MD  ATTENDING    Dietrich Pates, M.D.  PERFORMING   [facility], Outpatient  SONOGRAPHER  Bethany McMahill, RDCS  cc:  ------------------------------------------------------------------- LV EF: 55% -   60%  ------------------------------------------------------------------- Indications:      CAD (I25.10).  ------------------------------------------------------------------- History:   PMH:  Seizures, Rheumatic Fever (as child)  Murmur. Coronary artery disease.  Congestive heart failure.  Aortic valve disease.  Risk factors:  Family history of coronary artery disease.     ------------------------------------------------------------------- Study Conclusions  - Left ventricle: The cavity size was normal. Wall thickness was   increased in a pattern of mild LVH. Systolic function was normal.   The estimated ejection fraction was in the range of 55% to 60%.   The study is not technically sufficient to allow evaluation of LV   diastolic function. - Aortic valve: AV is thickened, calcified with restricted motion   Peak and mean gradients through the valve are 57 to 37 mm Hg   respectively consistent with moderate to severe AS. There is   trivial AI. SInce echo report of August 2017 this is very mildly   increased. - Mitral valve: There was mild regurgitation. -  Left atrium: The atrium was mildly dilated.  ------------------------------------------------------------------- Study data:  Comparison was made to the study of 03/19/2016.  Study status:  Routine.  Procedure:  Transthoracic echocardiography. Image quality was adequate.          Transthoracic echocardiography.  M-mode, complete 2D, 3D, spectral Doppler, and color Doppler.  Birthdate:  Patient birthdate: [birth year].  Age: Patient is 71 yr old.  Sex:  Gender: male.    BMI: 28.7 kg/m^2. Blood pressure:     138/78  Patient status:  Outpatient.  Study date:  Study date: 10/21/2016. Study time: 09:21 AM.  [facility]: [facility]  -------------------------------------------------------------------  ------------------------------------------------------------------- Left ventricle:  The cavity size was normal. Wall thickness was increased in a pattern of mild LVH. Systolic function was normal. The estimated ejection fraction was in the range of 55% to 60%. The study is not technically sufficient to allow evaluation of LV diastolic function.  ------------------------------------------------------------------- Aortic valve:  AV is thickened, calcified with restricted motion Peak and mean  gradients through the valve are 57 to 37 mm Hg respectively consistent with moderate to severe AS. There is trivial AI. SInce echo report of August 2017 this is very mildly increased.  Doppler:     VTI ratio of LVOT to aortic valve: 0.26. Valve area (VTI): 1.7 cm^2. Indexed valve area (VTI): 0.88 cm^2/m^2. Peak velocity ratio of LVOT to aortic valve: 0.25. Valve area (Vmax): 1.65 cm^2. Indexed valve area (Vmax): 0.85 cm^2/m^2. Mean velocity ratio of LVOT to aortic valve: 0.22. Valve area (Vmean): 1.44 cm^2. Indexed valve area (Vmean): 0.74 cm^2/m^2. Mean gradient (S): 34 mm Hg. Peak gradient (S): 55 mm Hg.  ------------------------------------------------------------------- Mitral valve:   Mildly thickened leaflets . Leaflet separation was normal.  Doppler:  Transvalvular velocity was within the normal range. There was no evidence for stenosis. There was mild regurgitation.  ------------------------------------------------------------------- Left atrium:  The atrium was mildly dilated.  ------------------------------------------------------------------- Right ventricle:  The cavity size was normal. Wall thickness was normal. Systolic function was normal.  ------------------------------------------------------------------- Pulmonic valve:    Structurally normal valve.   Cusp separation was normal.  Doppler:  Transvalvular velocity was within the normal range. There was no regurgitation.  ------------------------------------------------------------------- Tricuspid valve:   Structurally normal valve.   Leaflet separation was normal.  Doppler:  Transvalvular velocity was within the normal range. There was trivial regurgitation.  ------------------------------------------------------------------- Right atrium:  The atrium was normal in size.  ------------------------------------------------------------------- Pericardium:  There was no pericardial  effusion.  ------------------------------------------------------------------- Systemic veins: Inferior vena cava: The vessel was normal in size. The respirophasic diameter changes were in the normal range (= 50%), consistent with normal central venous pressure.  ------------------------------------------------------------------- Measurements   Left ventricle                           Value          Reference  LV ID, ED, PLAX chordal                  47.4  mm       43 - 52  LV ID, ES, PLAX chordal                  28.6  mm       23 - 38  LV fx shortening, PLAX chordal           40    %        >=29  LV PW thickness, ED                      13    mm       ----------  IVS/LV PW ratio, ED                      1.05           <=1.3  Stroke volume, 2D                        169   ml       ----------  Stroke volume/bsa, 2D                    87    ml/m^2   ----------  LV e&', lateral                           7.51  cm/s     ----------  LV E/e&', lateral                         7.5            ----------  LV e&', medial                            4.46  cm/s     ----------  LV E/e&', medial                          12.62          ----------  LV e&', average                           5.99  cm/s     ----------  LV E/e&', average                         9.41           ----------    Ventricular septum                       Value          Reference  IVS thickness, ED                        13.7  mm       ----------    LVOT                                     Value          Reference  LVOT ID, S                               29    mm       ----------  LVOT area                                6.61  cm^2     ----------  LVOT peak velocity, S  92.4  cm/s     ----------  LVOT mean velocity, S                    59.5  cm/s     ----------  LVOT VTI, S                              25.5  cm       ----------    Aortic valve                             Value          Reference  Aortic  valve peak velocity, S            370   cm/s     ----------  Aortic valve mean velocity, S            273   cm/s     ----------  Aortic valve VTI, S                      99.4  cm       ----------  Aortic mean gradient, S                  34    mm Hg    ----------  Aortic peak gradient, S                  55    mm Hg    ----------  VTI ratio, LVOT/AV                       0.26           ----------  Aortic valve area, VTI                   1.7   cm^2     ----------  Aortic valve area/bsa, VTI               0.88  cm^2/m^2 ----------  Velocity ratio, peak, LVOT/AV            0.25           ----------  Aortic valve area, peak velocity         1.65  cm^2     ----------  Aortic valve area/bsa, peak              0.85  cm^2/m^2 ----------  velocity  Velocity ratio, mean, LVOT/AV            0.22           ----------  Aortic valve area, mean velocity         1.44  cm^2     ----------  Aortic valve area/bsa, mean              0.74  cm^2/m^2 ----------  velocity  Aortic regurg pressure half-time         719   ms       ----------    Aorta                                    Value          Reference  Aortic root ID, ED  40    mm       ----------  Ascending aorta ID, A-P, S               34    mm       ----------    Left atrium                              Value          Reference  LA ID, A-P, ES                           46    mm       ----------  LA ID/bsa, A-P                   (H)     2.37  cm/m^2   <=2.2  LA volume, S                             55.8  ml       ----------  LA volume/bsa, S                         28.8  ml/m^2   ----------  LA volume, ES, 1-p A4C                   32.1  ml       ----------  LA volume/bsa, ES, 1-p A4C               16.6  ml/m^2   ----------  LA volume, ES, 1-p A2C                   86.8  ml       ----------  LA volume/bsa, ES, 1-p A2C               44.8  ml/m^2   ----------    Mitral valve                             Value          Reference   Mitral E-wave peak velocity              56.3  cm/s     ----------  Mitral A-wave peak velocity              54.3  cm/s     ----------  Mitral deceleration time         (H)     338   ms       150 - 230  Mitral E/A ratio, peak                   1              ----------    Right atrium                             Value          Reference  RA ID, S-I, ES, A4C              (H)     50.8  mm  34 - 49  RA area, ES, A4C                         17.4  cm^2     8.3 - 19.5  RA volume, ES, A/L                       50.1  ml       ----------  RA volume/bsa, ES, A/L                   25.9  ml/m^2   ----------    Right ventricle                          Value          Reference  TAPSE                                    17.8  mm       ----------  RV s&', lateral, S                        15.8  cm/s     ----------  Legend: (L)  and  (H)  mark values outside specified reference range.  ------------------------------------------------------------------- Prepared and Electronically Authenticated by  Dietrich Pates, M.D. 2018-03-08T21:29:19          *[place]*                   *[facility]                         [address]                        [place], [place] [address]                            [phone]  ------------------------------------------------------------------- Transesophageal Echocardiography  Patient:    Aashish, Eckhart MR #:       [id number] Study Date: 11/01/2016 Gender:     M Age:        70 Height:     166.4 cm Weight:     76.7 kg BSA:        1.9 m^2 Pt. Status: Room:   SONOGRAPHER  Perley Jain, RDCS  ORDERING     Thurmon Fair, MD  PERFORMING   Thurmon Fair, MD  cc:  ------------------------------------------------------------------- LV EF: 55% -   60%  ------------------------------------------------------------------- Indications:      Aortic stenosis  424.1.  ------------------------------------------------------------------- Study Conclusions  - Left ventricle: The cavity size was normal. There was mild   concentric hypertrophy. Systolic function was normal. The   estimated ejection fraction was in the range of 55% to 60%. Wall   motion was normal; there were no regional wall motion   abnormalities. Doppler parameters are consistent with abnormal   left ventricular relaxation (grade 1 diastolic dysfunction). - Aortic valve: Valve mobility was severely restricted. There was   severe stenosis. There was mild regurgitation. Valve area (VTI):   0.67 cm^2. Valve area (Vmax): 0.67 cm^2. Valve area (Vmean): 0.69   cm^2. - Mitral valve: No evidence of vegetation. - Left atrium:  No evidence of thrombus in the atrial cavity or   appendage. No spontaneous echo contrast was observed. - Right atrium: No evidence of thrombus in the atrial cavity or   appendage. - Tricuspid valve: No evidence of vegetation. - Pulmonic valve: No evidence of vegetation.  ------------------------------------------------------------------- Study data:   Study status:  Routine.  Consent:  The risks, benefits, and alternatives to the procedure were explained to the patient and informed consent was obtained.  Procedure:  The patient reported no pain pre or post test. Initial setup. The patient was brought to the laboratory. Surface ECG leads were monitored. Sedation. Conscious sedation was administered by cardiology staff. Transesophageal echocardiography. An adult multiplane transesophageal probe was inserted by the attending cardiologistwithout difficulty. Image quality was adequate.  Study completion:  The patient tolerated the procedure well. There were no complications.  Administered medications:   Fentanyl, , IV.  Midazolam, 8mg , IV.          Diagnostic transesophageal echocardiography.  2D and color Doppler.  Birthdate:  Patient birthdate:  October 25, 1945.  Age:  Patient is 71 yr old.  Sex:  Gender: male.    BMI: 27.7 kg/m^2.  Blood pressure:     154/76  Patient status:  Outpatient.  Study date:  Study date: 11/01/2016. Study time: 11:42 AM.  Location:  Endoscopy.  -------------------------------------------------------------------  ------------------------------------------------------------------- Left ventricle:  The cavity size was normal. There was mild concentric hypertrophy. Systolic function was normal. The estimated ejection fraction was in the range of 55% to 60%. Wall motion was normal; there were no regional wall motion abnormalities. Doppler parameters are consistent with abnormal left ventricular relaxation (grade 1 diastolic dysfunction).  ------------------------------------------------------------------- Aortic valve:   Probably trileaflet; severely thickened, severely calcified leaflets. Valve mobility was severely restricted. Doppler:   There was severe stenosis.   There was mild regurgitation.    VTI ratio of LVOT to aortic valve: 0.2. Valve area (VTI): 0.67 cm^2. Indexed valve area (VTI): 0.35 cm^2/m^2. Peak velocity ratio of LVOT to aortic valve: 0.2. Valve area (Vmax): 0.67 cm^2. Indexed valve area (Vmax): 0.35 cm^2/m^2. Mean velocity ratio of LVOT to aortic valve: 0.21. Valve area (Vmean): 0.69 cm^2. Indexed valve area (Vmean): 0.36 cm^2/m^2.    Mean gradient (S): 53 mm Hg. Peak gradient (S): 92 mm Hg.  ------------------------------------------------------------------- Aorta:  The aorta was not dilated and trivially diseased.  ------------------------------------------------------------------- Mitral valve:   Structurally normal valve.   Leaflet separation was normal.  No evidence of vegetation.  Doppler:  There was no regurgitation.  ------------------------------------------------------------------- Left atrium:  The atrium was normal in size.  No evidence of thrombus in the atrial  cavity or appendage. No spontaneous echo contrast was observed.  Emptying velocity was normal.  ------------------------------------------------------------------- Right ventricle:  The cavity size was normal. Systolic function was normal.  ------------------------------------------------------------------- Pulmonic valve:    Structurally normal valve.   Cusp separation was normal.  No evidence of vegetation.  ------------------------------------------------------------------- Tricuspid valve:   Structurally normal valve.   Leaflet separation was normal.  No evidence of vegetation.  Doppler:  There was no regurgitation.  ------------------------------------------------------------------- Right atrium:  The atrium was normal in size.  No evidence of thrombus in the atrial cavity or appendage.  ------------------------------------------------------------------- Pericardium:  There was no pericardial effusion.   ------------------------------------------------------------------- Post procedure conclusions Ascending Aorta:  - The aorta was not dilated and trivially diseased.  ------------------------------------------------------------------- Measurements   Left ventricle  Value  Stroke volume, 2D                        71    ml  Stroke volume/bsa, 2D                    37    ml/m^2    LVOT                                     Value  LVOT ID, S                               20.6  mm  LVOT area                                3.33  cm^2  LVOT peak velocity, S                    96.8  cm/s  LVOT mean velocity, S                    69.3  cm/s  LVOT VTI, S                              24.9  cm  LVOT peak gradient, S                    4     mm Hg  Stroke volume (SV), LVOT DP              83    ml  Stroke index (SV/bsa), LVOT DP           43.7  ml/m^2    Aortic valve                             Value  Aortic valve peak velocity, S            480    cm/s  Aortic valve mean velocity, S            337   cm/s  Aortic valve VTI, S                      123   cm  Aortic mean gradient, S                  53    mm Hg  Aortic peak gradient, S                  92    mm Hg  VTI ratio, LVOT/AV                       0.2  Aortic valve area, VTI                   0.67  cm^2  Aortic valve area/bsa, VTI               0.35  cm^2/m^2  Velocity ratio, peak, LVOT/AV            0.2  Aortic valve area, peak  velocity         0.67  cm^2  Aortic valve area/bsa, peak velocity     0.35  cm^2/m^2  Velocity ratio, mean, LVOT/AV            0.21  Aortic valve area, mean velocity         0.69  cm^2  Aortic valve area/bsa, mean velocity     0.36  cm^2/m^2  Legend: (L)  and  (H)  mark values outside specified reference range.  ------------------------------------------------------------------- Prepared and Electronically Authenticated by  Thurmon Fair, MD 2018-03-19T17:26:24   RISK SCORES About the STS Risk Calculator Procedure: AV Replacement + CAB  Risk of Mortality: 2.06%  Morbidity or Mortality: 14.811%  Long Length of Stay: 6.673%  Short Length of Stay: 39.421%  Permanent Stroke: 1.642%  Prolonged Ventilation: 8.944%  DSW Infection: 0.295%  Renal Failure: 3.223%  Reoperation: 8.012%     Impression:  This 71 year old gentleman has stage D, severe, symptomatic aortic stenosis with some fatigue and shortness of breath with exertion but he is able to do his normal daily activities without difficulty. He is probably NYHA functional class 1-2. I have personally reviewed his 2D echos, TEE and cath. 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 The mean AV gradient by TEE is 53 mm Hg consistent with severe AS and there is no evidence of subvalvular stenosis. 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  It has been 8 months since his PCI and I would have to assume that the RCA is still widely patent since it was a good result and he has not had return of significant chest discomfort. I think AVR is indicated in this patient. His STS PROM score is only 2.1% for AVR and CABG and less for AVR alone so I don't think he is a candidate for TAVR since he is clearly within the low risk group. I discussed that with him and explained that TAVR is not currently approved for the low risk group but may be in the near future. I discussed open surgical AVR, the alternative of continued observation pending the results of the TAVR low risk trial, benefits and risks of open surgery. I discussed the risk of continued deterioration of LV function and worsening symptoms with continued observation. He would like to think about it for a while and talk with his wife. He is concerned about his recovery with his severe bilateral knee DJD.  Plan:  He will think about open surgical AVR and will call us back if he decides to proceed so that it can be scheduled. Otherwise if he decides to continue observation he will contact Dr. Norris Cross office so that close follow up can be arranged.   I spent 60 minutes performing this consultation and > 50% of this time was spent face to face counseling and coordinating the care of this patient's severe aortic stenosis.   Alleen Borne, MD [facility] [phone]

## 2016-12-14 ENCOUNTER — Ambulatory Visit (INDEPENDENT_AMBULATORY_CARE_PROVIDER_SITE_OTHER): Payer: 59 | Admitting: Psychiatry

## 2016-12-14 DIAGNOSIS — F4323 Adjustment disorder with mixed anxiety and depressed mood: Secondary | ICD-10-CM

## 2017-01-25 ENCOUNTER — Other Ambulatory Visit: Payer: Self-pay | Admitting: *Deleted

## 2017-01-25 DIAGNOSIS — I350 Nonrheumatic aortic (valve) stenosis: Secondary | ICD-10-CM

## 2017-02-02 ENCOUNTER — Ambulatory Visit (HOSPITAL_COMMUNITY): Admission: RE | Admit: 2017-02-02 | Payer: Medicare Other | Source: Ambulatory Visit

## 2017-02-02 ENCOUNTER — Ambulatory Visit (HOSPITAL_COMMUNITY)
Admission: RE | Admit: 2017-02-02 | Discharge: 2017-02-02 | Disposition: A | Discharge status: Home / Self Care | Payer: Medicare Other | Source: Ambulatory Visit | Attending: Surgery | Admitting: Surgery

## 2017-02-02 ENCOUNTER — Encounter (HOSPITAL_COMMUNITY): Payer: Self-pay

## 2017-02-02 DIAGNOSIS — I350 Nonrheumatic aortic (valve) stenosis: Secondary | ICD-10-CM | POA: Diagnosis not present

## 2017-02-02 DIAGNOSIS — I700 Atherosclerosis of aorta: Secondary | ICD-10-CM | POA: Diagnosis not present

## 2017-02-02 LAB — POCT I-STAT CREATININE: Creatinine, Ser: 0.8 mg/dL (ref 0.61–1.24)

## 2017-02-02 MED ORDER — IOPAMIDOL (ISOVUE-370) INJECTION 76%
INTRAVENOUS | Status: AC
  Administered 2017-02-02: 80 mL
  Filled 2017-02-02: qty 100

## 2017-02-08 ENCOUNTER — Ambulatory Visit (INDEPENDENT_AMBULATORY_CARE_PROVIDER_SITE_OTHER): Payer: Medicare Other | Admitting: Surgery

## 2017-02-08 ENCOUNTER — Encounter: Payer: Self-pay | Admitting: Surgery

## 2017-02-08 VITALS — BP 129/82 | HR 67 | Ht 67.0 in | Wt 172.0 lb

## 2017-02-08 DIAGNOSIS — I350 Nonrheumatic aortic (valve) stenosis: Secondary | ICD-10-CM | POA: Diagnosis not present

## 2017-02-09 ENCOUNTER — Encounter: Payer: Self-pay | Admitting: Surgery

## 2017-02-09 ENCOUNTER — Other Ambulatory Visit: Payer: Self-pay | Admitting: *Deleted

## 2017-02-09 DIAGNOSIS — I350 Nonrheumatic aortic (valve) stenosis: Secondary | ICD-10-CM

## 2017-02-09 NOTE — Progress Notes (Signed)
HPI:  Douglas Lane returns to discuss the results of his gated cardiac CT and to make further plans for treating his stage D, severe, symptomatic aortic stenosis. He feels about the same with NYHA class 1-2 symptoms of exertional fatigue and shortness of breath. His cardiac CT shows anatomy favorable for a 29 mm Sapien 3 valve.   Current Outpatient Prescriptions  Medication Sig Dispense Refill  . acetaminophen (TYLENOL) 500 MG tablet Take 1,000 mg by mouth every 8 (eight) hours as needed.    Marland Kitchen aspirin 81 MG tablet Take 81 mg by mouth daily.    Marland Kitchen desonide (DESOWEN) 0.05 % cream Apply 1 application topically as needed.    . diphenhydrAMINE (BENADRYL) 2 % cream Apply topically as needed for itching.    Marland Kitchen HUMIRA PEN 40 MG/0.8ML PNKT Inject 40 mg into the skin every 28 (twenty-eight) days.     Marland Kitchen levETIRAcetam (KEPPRA) 750 MG tablet Take 1 tablet (750 mg total) by mouth 2 (two) times daily. 180 tablet 3  . nitroGLYCERIN (NITROSTAT) 0.4 MG SL tablet Place 1 tablet (0.4 mg total) under the tongue every 5 (five) minutes as needed for chest pain (up to 3 doses). 25 tablet 3  . PROCTOZONE-HC 2.5 % rectal cream Apply 1 application topically daily as needed for hemorrhoids or itching.     . rosuvastatin (CRESTOR) 20 MG tablet Take 1 tablet (20 mg total) by mouth daily. 30 tablet 11  . ticagrelor (BRILINTA) 90 MG TABS tablet Take 1 tablet (90 mg total) by mouth 2 (two) times daily. 180 tablet 3  . clobetasol ointment (TEMOVATE) 6.29 % Apply 1 application topically as needed (skin irritation). Daily  1   No current facility-administered medications for this visit.      Physical Exam: BP 129/82 (BP Location: Left Arm, Patient Position: Sitting, Cuff Size: Large)   Pulse 67   Ht 5\' 7"  (1.702 m)   Wt 172 lb (78 kg)   SpO2 94% Comment: ON RA  BMI 26.94 kg/m  He looks well but has a hard time ambulating due to his bilateral knee arthritis Cardiac exam shows a regular rate and rhythm with a 3/6  systolic murmur RSB Lungs are clear There is no peripheral edema  Diagnostic Tests:  CT CORONARY MORPH W/CTA COR W/SCORE W/CA W/CM &/OR WO/CM (Accession [id number]) (Order [id number])  Imaging  Date: 02/02/2017 Department: [facility] CT IMAGING Released By: Garth Bigness D Authorizing: Gaye Pollack, MD  Exam Information   Status Exam Begun  Exam Ended   Final [99] 02/02/2017 10:06 AM 02/02/2017 10:40 AM  PACS Images   Show images for CT CORONARY MORPH W/CTA COR W/SCORE W/CA W/CM &/OR WO/CM  Addendum   ADDENDUM REPORT: 02/02/2017 14:48  CLINICAL DATA:  71 year old male with severe aortic stenosis being evaluated for TAVR procedure.  EXAM: Cardiac TAVR CT  TECHNIQUE: The patient was scanned on a Philips 256 scanner. A 120 kV retrospective scan was triggered in the descending thoracic aorta at 111 HU's. Gantry rotation speed was 270 msecs and collimation was .9 mm. No beta blockade or nitro were given. The 3D data set was reconstructed in 5% intervals of the R-R cycle. Systolic and diastolic phases were analyzed on a dedicated work station using MPR, MIP and VRT modes. The patient received 80 cc of contrast.  FINDINGS: Aortic Valve: Trileaflet with partially co-joined left and right leaflets, severely calcified and thickened aortic valve with severely restricted leaflet opening.  Only minimal calcifications are extending into LVOT.  Aorta: Normal size with no dissection and trivial calcifications in the aortic arch.  Sinotubular Junction:  30 x 29 mm  Ascending Thoracic Aorta:  34 x 33 mm  Aortic Arch:  28 x 27 mm  Descending Thoracic Aorta:  27 x 27 mm  Sinus of Valsalva Measurements:  Non-coronary:  38 mm  Right -coronary:  34 mm  Left -coronary:  36 mm  Coronary Artery Height above Annulus:  Left Main:  15 mm  Right Coronary:  18 mm  Virtual Basal Annulus Measurements:  Maximum/Minimum Diameter:  30 x 23  mm  Perimeter:  86 mm  Area:  555 mm2  Optimum Fluoroscopic Angle for Delivery:  LAO 5 CAU 4  No thrombus in the left atrial appendage.  IMPRESSION: 1. Trileaflet aortic valve partially co-joined left and right leaflets. Leaflets are severely calcified and thickened with severely restricted leaflet opening. Only minimal calcifications are extending into LVOT. Annular measurements suitable for delivery of a 29 mm Edwards-SAPIEN 3 valve.  2.  Sufficient coronary to annulus distance.  3. Optimum Fluoroscopic Angle for Delivery:  LAO 5 CAU 4  4.  No thrombus in the left atrial appendage.  [name]   Electronically Signed   By: [name]   On: 02/02/2017 14:48        Impression:  This 71 year old gentleman has stage D, severe, symptomatic aortic stenosis with NYHA class 1-2 symptoms of  fatigue and shortness of breath with exertion. 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 The mean AV gradient by TEE is 53 mm Hg consistent with severe AS and there is no evidence of subvalvular stenosis. 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 It has been 8 months since his PCI and I would have to assume that the RCA is still widely patent since it was a good result and he has not had return of significant chest discomfort. I think AVR is indicated in this patient. His STS PROM is only 2.1% for AVR and CABG but I think he would have a difficult time recovering from that due to his severe bilateral knee DJD that limits his ambulation and will require a knee replacement once his aortic stenosis is treated. I think he is probably intermediate risk for open surgical treatment of his AS due to his limited mobility. Given his age of 27 and since his annular size would allow a 29 mm Sapien 3 valve I think TAVR would be the  best treatment for him. I reviewed the CT images with him and his wife and answered their questions.   The patient and his wife were counseled at length regarding treatment alternatives for management of severe symptomatic aortic stenosis. The risks and benefits of surgical intervention has been discussed in detail. Long-term prognosis with medical therapy was discussed. Alternative approaches such as conventional surgical aortic valve replacement, transcatheter aortic valve replacement, and palliative medical therapy were compared and contrasted at length. This discussion was placed in the context of the patient's own specific clinical presentation and past medical history. All of their questions been addressed. The patient is eager to proceed with surgical management as soon as possible.   The patient has been advised of a variety of complications that might develop including but not limited to risks of death, stroke, paravalvular  leak, aortic dissection or other major vascular complications, aortic annulus rupture, device embolization, cardiac rupture or perforation, mitral regurgitation, acute myocardial infarction, arrhythmia, heart block or bradycardia requiring permanent pacemaker placement, congestive heart failure, respiratory failure, renal failure, pneumonia, infection, other late complications related to structural valve deterioration or migration, or other complications that might ultimately cause a temporary or permanent loss of functional independence or other long term morbidity. The patient provides full informed consent for the procedure as described and all questions were answered.    Plan:  He will be scheduled for a CTA of the chest, abdomen and pelvis for further TAVR planning.  He will be scheduled for evaluation by one of the interventional cardiologists on the heart valve team and will have a second surgical evaluation.  He will have a PT evaluation and PFT's.  TAVR to be  scheduled after that if the other members of the heart valve team agree.  I spent 15 minutes performing this established patient evaluation and > 50% of this time was spent face to face counseling and coordinating the care of this patient's severe aortic stenosis.   Gaye Pollack, MD [facility] [phone]

## 2017-02-10 ENCOUNTER — Other Ambulatory Visit: Payer: Self-pay | Admitting: *Deleted

## 2017-02-10 DIAGNOSIS — I350 Nonrheumatic aortic (valve) stenosis: Secondary | ICD-10-CM

## 2017-02-22 ENCOUNTER — Telehealth: Payer: Self-pay

## 2017-02-22 MED ORDER — CLOPIDOGREL BISULFATE 75 MG PO TABS: 75.0000 mg | ORAL_TABLET | Freq: Every day | ORAL | 3 refills | Status: DC

## 2017-02-22 NOTE — Telephone Encounter (Signed)
Left message for pt to call the office to discuss transitioning from Brilinta to plavix in preparation for TAVR.

## 2017-02-22 NOTE — Telephone Encounter (Signed)
Follow up ° ° °Pt calling back °

## 2017-02-22 NOTE — Telephone Encounter (Signed)
I spoke with the pt and made him aware that we plan to transition him from Brilinta to Plavix prior to TAVR.  I instructed the pt to continue Brilinta through 03/15/17 and starting 03/16/17 he will start Plavix 75mg  daily.  Pt verbalized understanding of instructions and Rx sent to the pharmacy to place on hold.

## 2017-03-01 ENCOUNTER — Encounter: Payer: Self-pay | Admitting: Neurology

## 2017-03-01 ENCOUNTER — Ambulatory Visit (INDEPENDENT_AMBULATORY_CARE_PROVIDER_SITE_OTHER): Payer: Medicare Other | Admitting: Neurology

## 2017-03-01 VITALS — BP 130/62 | HR 70 | Ht 66.0 in | Wt 175.0 lb

## 2017-03-01 DIAGNOSIS — F321 Major depressive disorder, single episode, moderate: Secondary | ICD-10-CM

## 2017-03-01 DIAGNOSIS — G40009 Localization-related (focal) (partial) idiopathic epilepsy and epileptic syndromes with seizures of localized onset, not intractable, without status epilepticus: Secondary | ICD-10-CM | POA: Diagnosis not present

## 2017-03-01 MED ORDER — LEVETIRACETAM 750 MG PO TABS: 750.0000 mg | ORAL_TABLET | Freq: Two times a day (BID) | ORAL | 3 refills | Status: DC

## 2017-03-01 NOTE — Progress Notes (Signed)
NEUROLOGY FOLLOW UP OFFICE NOTE  JOVAUGHN WOJTASZEK [id number]  HISTORY OF PRESENT ILLNESS: I had the pleasure of seeing Stepen Prins in follow-up in the neurology clinic on 03/01/2017.  The patient was last seen 6 months ago for recurrent seizures. He had been seizure-free for a year off Keppra, and had tapered of clorazepate which he was taking for anxiety. He had a generalized convulsion on 07/31/14, 3 days after stopping clorazepate. He was restarted on Keppra 500mg  BID. His wife had also reported that after stopping the Keppra, he had prolonged episodes of memory lapses where he could not recall the last few hours. He had called our office to report an episode of memory lapse on 02/22/16. He was at church, he recalls standing at the end of service, then next thing he recalls is being in the car on the way home. He called his wife and told her where he was, but has no recollection of previous 30-76mins. She told him that he sounded confused, which he did because he had planned to make stops but he did not do so. He denied any associated headache, focal numbness/tingling/weakness, dizziness. There was a cup of coffee in the car that he probably got on his way out, but did not recall getting this. He denied any missed medications, sleep deprivation, infection. There had been plenty of stress, related to wife's parents and travelling. Dose of Keppra was increased to 750mg  BID.   He is wondering about tapering down Keppra if he does not need to be "on all this medicine." He denies any side effects to the Keppra. He will be undergoing TVAR for severe aortic stenosis over the next month or so. He denies any headaches, dizziness, olfactory/gustatory hallucinations, focal numbness/tingling/weakness. He is undergoing a lot of stress with his cardiac issues and family issues, mood is not great. He tried counseling for 4 times but did not find it helpful. His left knee bothers him more than the right. No falls.  HPI  08/20/2014: This is a 71 yo RH man with a history of 2 seizures in 2012 that occurred 6 months apart, presenting after ER visit for a seizure last 08/03/14. The first seizure occurred in June 2012. He recalls it was a stressful period just soon after he retired. He had a nocturnal convulsion and was brought to [facility]. He was not started on medication initially as it was his first seizure. On December 2012, he had another nocturnal convulsion and was started on Keppra 500mg  BID with no further seizures for 3 years. He and his neurologist in [facility] agreed to start weaning off Keppra, and this was completely discontinued the spring/summer of 2015. Around 2-1/2 years ago, he had also been reporting anxiety to his neurologist, and was started on clorazepate 7.5mg , weaned to 1/2 tab for 6 months, then discontinued 3 days prior to the most recent nocturnal seizure on 12/16 off AEDs. He was brought to [facility] ER, there is no bloodwork or drug screen for review, CT head unremarkable.He was found to have posterior dislocation and comminuted fracture of the right humeral head. He had soft tissue injury extending along the right anterior proximal arm. This was reduced in the ER and he continues to wear a sling. He was restarted on Keppra 500mg  BID with no side effects except for mild drowsiness.   His wife reports that since coming off the [place] last year, he has had 3 incidences of memory lapses, where he could not  recall the last few hours. His wife reports that he would look confused but he can function and drive. The last episode occurred 6 weeks ago, he did not recall being in church. His wife reports he was talking fine but had a different look about him. This lasted about 1-2 hours. He had neuropsychological testing for memory loss and was told he has "short-term memory problems." In hindsight, when asked about an MRI done in January 2012, he had an episode where he had transient memory loss while he was  at a [place]. I personally reviewed MRI brain without contrast done which showed mild chronic microvascular changes in the bilateral subcortical regions and right central pons.  He denies any episodes of staring/unresponsiveness, no olfactory/gustatory hallucinations, deja vu, rising epigastric sensation, focal numbness/tingling/weakness, myoclonic jerks. He has 1-4 drinks daily (beer or whisky), with no change in pattern for many years. He denies any sleep deprivation prior to the seizures, but reports these being stressful periods. 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 There is no history of febrile convulsions, CNS infections such as meningitis/encephalitis, significant traumatic brain injury, neurosurgical procedures, or family history of seizures.   PAST MEDICAL HISTORY: Past Medical History:  Diagnosis Date  . Aortic stenosis    a. moderate by echo 03/2016; based on cath 03/2016 it is suspected that much of his gradient is subvalvular.  . Arthritis    "pain in my knees" (03/30/2016)  . CAD S/P percutaneous coronary angioplasty 03/31/2016   a. CP/USA -> LHC 03/31/16: 90% prox-mid RCA s/p DES, 40% prox-mid LAD, 25% D2, 25% prox-mid Cx, elevated LVEDP 33.  . Chronic diastolic CHF (congestive heart failure) ([place]) 03/31/2016  . Chronic lower back pain   . Dyslipidemia   . Heart murmur    "since I was a kid" (03/30/2016)  . LVH (left ventricular hypertrophy)    a. severe basal septal hypertrophy by echo 03/2016.  . Mitral regurgitation    a. mild by echo 03/2016.  Marland Kitchen Psoriasis   . Rheumatic fever    as a child  . 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 Sinus bradycardia - baseline HR 50s at times.   . Sleep apnea   . 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 Thrombosed hemorrhoids     MEDICATIONS: Current Outpatient Prescriptions on  File Prior to Visit  Medication Sig Dispense Refill  . acetaminophen (TYLENOL) 500 MG tablet Take 1,000 mg by mouth every 8 (eight) hours as needed.    Marland Kitchen aspirin 81 MG tablet Take 81 mg by mouth daily.    . clobetasol ointment (TEMOVATE) 4.82 % Apply 1 application topically as needed (skin irritation). Daily  1  . clopidogrel (PLAVIX) 75 MG tablet Take 1 tablet (75 mg total) by mouth daily. 90 tablet 3  . desonide (DESOWEN) 0.05 % cream Apply 1 application topically as needed.    . diphenhydrAMINE (BENADRYL) 2 % cream Apply topically as needed for itching.    Marland Kitchen HUMIRA PEN 40 MG/0.8ML PNKT Inject 40 mg into the skin every 28 (twenty-eight) days.     Marland Kitchen levETIRAcetam (KEPPRA) 750 MG tablet Take 1 tablet (750 mg total) by mouth 2 (two) times daily. 180 tablet 3  . nitroGLYCERIN (NITROSTAT) 0.4 MG SL tablet Place 1 tablet (0.4 mg total) under the tongue  every 5 (five) minutes as needed for chest pain (up to 3 doses). 25 tablet 3  . PROCTOZONE-HC 2.5 % rectal cream Apply 1 application topically daily as needed for hemorrhoids or itching.     . rosuvastatin (CRESTOR) 20 MG tablet Take 1 tablet (20 mg total) by mouth daily. 30 tablet 11  . ticagrelor (BRILINTA) 90 MG TABS tablet Take 1 tablet (90 mg total) by mouth 2 (two) times daily. 180 tablet 3   No current facility-administered medications on file prior to visit.     ALLERGIES: No Known Allergies  FAMILY HISTORY: Family History  Problem Relation Age of Onset  . Renal Disease Mother   . Congestive Heart Failure Father   . Depression Sister     SOCIAL HISTORY: Social History   Social History  . Marital status: Married    Spouse name: N/A  . Number of children: N/A  . Years of education: N/A   Occupational History  . Not on file.   Social History Main Topics  . Smoking status: Never Smoker  . Smokeless tobacco: Never Used  . Alcohol use 9.0 oz/week    10 Cans of beer, 5 Shots of liquor per week  . Drug use: No  . Sexual  activity: Not Currently   Other Topics Concern  . Not on file   Social History Narrative  . No narrative on file    REVIEW OF SYSTEMS: Constitutional: No fevers, chills, or sweats, no generalized fatigue, change in appetite Eyes: No visual changes, double vision, eye pain Ear, nose and throat: No hearing loss, ear pain, nasal congestion, sore throat Cardiovascular: No chest pain, palpitations Respiratory:  No shortness of breath at rest or with exertion, wheezes GastrointestinaI: No nausea, vomiting, diarrhea, abdominal pain, fecal incontinence Genitourinary:  No dysuria, urinary retention or frequency Musculoskeletal:  +joint pain, left knee pain Integumentary: No rash, pruritus, skin lesions Neurological: as above Psychiatric: No depression, insomnia, anxiety Endocrine: No palpitations, fatigue, diaphoresis, mood swings, change in appetite, change in weight, increased thirst Hematologic/Lymphatic:  No anemia, purpura, petechiae. Allergic/Immunologic: no itchy/runny eyes, nasal congestion, recent allergic reactions, rashes  PHYSICAL EXAM: Vitals:   03/01/17 0957  BP: 130/62  Pulse: 70   General: No acute distress Head:  Normocephalic/atraumatic Neck: supple, no paraspinal tenderness, full range of motion Heart:  Regular rate and rhythm Lungs:  Clear to auscultation bilaterally Back: No paraspinal tenderness Skin/Extremities: No rash, no edema Neurological Exam: alert and oriented to person, place, and time. No aphasia or dysarthria. Fund of knowledge is appropriate.  Recent and remote memory are intact. Attention and concentration are normal.    Able to name objects and repeat phrases. Cranial nerves: Pupils equal, round, reactive to light.  Extraocular movements intact with no nystagmus. Visual fields full. Facial sensation intact. No facial asymmetry. Tongue, uvula, palate midline.  Motor: Bulk and tone normal, muscle strength 5/5 throughout with no pronator drift.  Sensation  to light touch intact.  No extinction to double simultaneous stimulation.  Deep tendon reflexes 2+ throughout, toes downgoing.  Finger to nose testing intact.  Gait slow and cautious due to bilateral knee pain, unable to tandem walk due to pain. Romberg negative.  IMPRESSION: This is a 71 yo RH man with a history of 2 seizures in 2012. As he had been seizure-free for 3 years, Keppra was tapered off last spring/summer 2015. He had been taking clorazepate for anxiety and was tapered off this 3 days prior to the nocturnal seizure in  08/03/14. No further convulsions since then. 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 He had another episode of gap in time last July 2017, Keppra dose increased to 750mg  BID. He is asking about reducing dose, we discussed rationale behind staying on this dose, he is not having side effects, he expressed understanding. We again discussed family stress and depression, he may benefit from starting an SSRI at some point. He is aware of [place] driving laws that indicate one has to stop driving after a seizure, until 6 months seizure-free. He will follow-up in 6 months or earlier if needed.   Thank you for allowing me to participate in his care.  Please do not hesitate to call for any questions or concerns.  The duration of this appointment visit was 25 minutes of face-to-face time with the patient.  Greater than 50% of this time was spent in counseling, explanation of diagnosis, planning of further management, and coordination of care.   Ellouise Newer, M.D.   CC: Dr. Harrington Challenger

## 2017-03-01 NOTE — Patient Instructions (Signed)
1. Continue Keppra 750mg  twice a day 2. Continue to monitor mood 3. Wishing you well with the procedure! 4. Follow-up in 6 months, call for any changes  Seizure Precautions: 1. If medication has been prescribed for you to prevent seizures, take it exactly as directed.  Do not stop taking the medicine without talking to your doctor first, even if you have not had a seizure in a long time.   2. Avoid activities in which a seizure would cause danger to yourself or to others.  Don't operate dangerous machinery, swim alone, or climb in high or dangerous places, such as on ladders, roofs, or girders.  Do not drive unless your doctor says you may.  3. If you have any warning that you may have a seizure, lay down in a safe place where you can't hurt yourself.    4.  No driving for 6 months from last seizure, as per [facility].   Please refer to the following link on the [place] website for more information: http://www.epilepsyfoundation.org/answerplace/Social/driving/drivingu.cfm   5.  Maintain good sleep hygiene. Avoid alcohol  6.  Contact your doctor if you have any problems that may be related to the medicine you are taking.  7.  Call 911 and bring the patient back to the ED if:        A.  The seizure lasts longer than 5 minutes.       B.  The patient doesn't awaken shortly after the seizure  C.  The patient has new problems such as difficulty seeing, speaking or moving  D.  The patient was injured during the seizure  E.  The patient has a temperature over 102 F (39C)  F.  The patient vomited and now is having trouble breathing

## 2017-03-08 ENCOUNTER — Encounter: Payer: Self-pay | Admitting: Physical Therapy

## 2017-03-08 ENCOUNTER — Ambulatory Visit (HOSPITAL_COMMUNITY)
Admission: RE | Admit: 2017-03-08 | Discharge: 2017-03-08 | Disposition: A | Discharge status: Home / Self Care | Payer: Medicare Other | Source: Ambulatory Visit | Attending: Surgery | Admitting: Surgery

## 2017-03-08 ENCOUNTER — Ambulatory Visit (HOSPITAL_BASED_OUTPATIENT_CLINIC_OR_DEPARTMENT_OTHER)
Admission: RE | Admit: 2017-03-08 | Discharge: 2017-03-08 | Disposition: A | Discharge status: Home / Self Care | Payer: Medicare Other | Source: Ambulatory Visit | Attending: Surgery | Admitting: Surgery

## 2017-03-08 ENCOUNTER — Ambulatory Visit (HOSPITAL_COMMUNITY): Payer: Medicare Other

## 2017-03-08 ENCOUNTER — Ambulatory Visit: Payer: Medicare Other | Attending: Surgery | Admitting: Physical Therapy

## 2017-03-08 DIAGNOSIS — I700 Atherosclerosis of aorta: Secondary | ICD-10-CM | POA: Diagnosis not present

## 2017-03-08 DIAGNOSIS — I350 Nonrheumatic aortic (valve) stenosis: Secondary | ICD-10-CM

## 2017-03-08 DIAGNOSIS — R293 Abnormal posture: Secondary | ICD-10-CM | POA: Diagnosis present

## 2017-03-08 DIAGNOSIS — I771 Stricture of artery: Secondary | ICD-10-CM | POA: Insufficient documentation

## 2017-03-08 DIAGNOSIS — R2689 Other abnormalities of gait and mobility: Secondary | ICD-10-CM | POA: Insufficient documentation

## 2017-03-08 DIAGNOSIS — I2510 Atherosclerotic heart disease of native coronary artery without angina pectoris: Secondary | ICD-10-CM | POA: Insufficient documentation

## 2017-03-08 DIAGNOSIS — N2889 Other specified disorders of kidney and ureter: Secondary | ICD-10-CM | POA: Insufficient documentation

## 2017-03-08 DIAGNOSIS — I517 Cardiomegaly: Secondary | ICD-10-CM | POA: Diagnosis not present

## 2017-03-08 HISTORY — DX: Atherosclerosis of aorta: I70.0

## 2017-03-08 LAB — PULMONARY FUNCTION TEST
DL/VA % pred: 75 %
DL/VA: 3.26 ml/min/mmHg/L
DLCO UNC % PRED: 73 %
DLCO UNC: 19.69 ml/min/mmHg
FEF 25-75 POST: 2 L/s
FEF 25-75 PRE: 1.94 L/s
FEF2575-%Change-Post: 3 %
FEF2575-%PRED-POST: 101 %
FEF2575-%PRED-PRE: 98 %
FEV1-%Change-Post: 2 %
FEV1-%PRED-POST: 103 %
FEV1-%Pred-Pre: 101 %
FEV1-POST: 2.76 L
FEV1-Pre: 2.7 L
FEV1FVC-%Change-Post: 3 %
FEV1FVC-%PRED-PRE: 98 %
FEV6-%CHANGE-POST: -2 %
FEV6-%PRED-POST: 105 %
FEV6-%Pred-Pre: 108 %
FEV6-Post: 3.65 L
FEV6-Pre: 3.74 L
FEV6FVC-%CHANGE-POST: -1 %
FEV6FVC-%PRED-PRE: 107 %
FEV6FVC-%Pred-Post: 105 %
FVC-%Change-Post: -1 %
FVC-%PRED-POST: 100 %
FVC-%Pred-Pre: 101 %
FVC-Post: 3.7 L
FVC-Pre: 3.74 L
POST FEV1/FVC RATIO: 74 %
PRE FEV1/FVC RATIO: 72 %
Post FEV6/FVC ratio: 98 %
Pre FEV6/FVC Ratio: 100 %
RV % PRED: 99 %
RV: 2.28 L
TLC % pred: 98 %
TLC: 6.17 L

## 2017-03-08 LAB — VAS US CAROTID
LCCADDIAS: -10 cm/s
LCCAPSYS: 82 cm/s
LEFT ECA DIAS: -7 cm/s
LEFT VERTEBRAL DIAS: 12 cm/s
LICADDIAS: -14 cm/s
LICADSYS: -47 cm/s
LICAPDIAS: -20 cm/s
LICAPSYS: -72 cm/s
Left CCA dist sys: -64 cm/s
Left CCA prox dias: 10 cm/s
RIGHT ECA DIAS: -2 cm/s
RIGHT VERTEBRAL DIAS: 10 cm/s
Right CCA prox dias: 15 cm/s
Right CCA prox sys: 63 cm/s
Right cca dist sys: -56 cm/s

## 2017-03-08 MED ORDER — IOPAMIDOL (ISOVUE-370) INJECTION 76%
INTRAVENOUS | Status: AC
  Administered 2017-03-08: 100 mL
  Filled 2017-03-08: qty 100

## 2017-03-08 MED ORDER — ALBUTEROL SULFATE (2.5 MG/3ML) 0.083% IN NEBU
2.5000 mg | INHALATION_SOLUTION | Freq: Once | RESPIRATORY_TRACT | Status: AC
  Administered 2017-03-08: 2.5 mg via RESPIRATORY_TRACT

## 2017-03-08 NOTE — Therapy (Signed)
[place], [place], [address] Phone: [phone]   Fax:  [phone]  Physical Therapy Evaluation  Patient Details  Name: Douglas Lane MRN: [id number] Date of Birth: [birth year] Referring Provider: Dr. Gilford Raid  Encounter Date: 03/08/2017      PT End of Session - 03/08/17 1545    Visit Number 1   PT Start Time 1500   PT Stop Time 1537   PT Time Calculation (min) 37 min      Past Medical History:  Diagnosis Date  . Aortic stenosis    a. moderate by echo 03/2016; based on cath 03/2016 it is suspected that much of his gradient is subvalvular.  . Arthritis    "pain in my knees" (03/30/2016)  . CAD S/P percutaneous coronary angioplasty 03/31/2016   a. CP/USA -> LHC 03/31/16: 90% prox-mid RCA s/p DES, 40% prox-mid LAD, 25% D2, 25% prox-mid Cx, elevated LVEDP 33.  . Chronic diastolic CHF (congestive heart failure) ([place]) 03/31/2016  . Chronic lower back pain   . Dyslipidemia   . Heart murmur    "since I was a kid" (03/30/2016)  . LVH (left ventricular hypertrophy)    a. severe basal septal hypertrophy by echo 03/2016.  . Mitral regurgitation    a. mild by echo 03/2016.  Marland Kitchen Psoriasis   . Rheumatic fever    as a child  . Seizure ([place]) 01/2011; 07/2011; 07/2015      "idiopathic; been on Kepra since 07/2011"  . Sinus bradycardia - baseline HR 50s at times.   . Sleep apnea   . 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 Thrombosed hemorrhoids     Past Surgical History:  Procedure Laterality Date  . APPENDECTOMY    . BACK SURGERY    . CARDIAC CATHETERIZATION N/A 03/30/2016   Procedure: Right/Left Heart Cath and Coronary Angiography;  Surgeon: Jettie Booze, MD;  Location: [facility];  Service: Cardiovascular;  Laterality: N/A;  . CARDIAC CATHETERIZATION N/A 03/30/2016   Procedure: Coronary Stent Intervention;  Surgeon: Jettie Booze, MD;  Location: [facility];  Service:  Cardiovascular;  Laterality: N/A;  . CORONARY ANGIOPLASTY    . FOREARM FRACTURE SURGERY  ~ 2003-2004   "got steel plate in there"  . I&D hemorrhoirds    . KNEE ARTHROSCOPY Bilateral   . MOHS SURGERY Left    temple  . ORIF SCAPULAR FRACTURE Right    right  . POSTERIOR LUMBAR FUSION  2004  . TEE WITHOUT CARDIOVERSION N/A 11/01/2016   Procedure: TRANSESOPHAGEAL ECHOCARDIOGRAM (TEE);  Surgeon: Sanda Klein, MD;  Location: [facility] ENDOSCOPY;  Service: Cardiovascular;  Laterality: N/A;  . TONSILLECTOMY      There were no vitals filed for this visit.       Subjective Assessment - 03/08/17 1507    Subjective Pt reports shortness of breath and fatigue with stairs, inclines and walking dog. He has seen limitations since Dec 2017/Jan 2018. Pt also ackowledges some chest pain intermittently with strenuous activity.    Patient Stated Goals to fix heart and return to exercise program   Currently in Pain? No/denies            [facility] PT Assessment - 03/08/17 0001      Assessment   Medical Diagnosis severe aortic stenosis   Referring Provider Dr. Gilford Raid   Onset Date/Surgical Date --  approximately 6 months ago     Precautions  Precautions Other (comment)   Precaution Comments don't overexert yourself     Restrictions   Weight Bearing Restrictions No     Balance Screen   Has the patient fallen in the past 6 months No   Has the patient had a decrease in activity level because of a fear of falling?  No   Is the patient reluctant to leave their home because of a fear of falling?  No     Home Social worker Private residence   Home Access Stairs to enter   Entrance Stairs-Number of Steps 3   Entrance Stairs-Rails --  post   [place] Two level;Able to live on main level with bedroom/bathroom     Posture/Postural Control   Posture/Postural Control Postural limitations   Postural Limitations Forward head;Rounded Shoulders  mild     ROM / Strength   AROM  / PROM / Strength AROM;Strength     AROM   Overall AROM Comments grossly WNL throughout     Strength   Overall Strength Comments grossly 5/5 throughout   Strength Assessment Site Hand   Right/Left hand Right;Left   Right Hand Grip (lbs) 86  R hand dominant   Left Hand Grip (lbs) 85     Ambulation/Gait   Gait Comments Pt demonstrated bil. genu varus. Pt's gait distance as limited by 24% for age/gender in 6 minute walk.           [facility] Pre-Surgical Assessment - 03/08/17 0001    5 Meter Walk Test- trial 1 5 sec   5 Meter Walk Test- trial 2 5 sec.    5 Meter Walk Test- trial 3 5 sec.   5 meter walk test average 5 sec   4 Stage Balance Test tolerated for:  10 sec.   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   ADL/IADL Independent with: Bathing;Dressing;Meal prep;Finances   ADL/IADL Needs Assistance with: Valla Leaver work   6 Minute Walk- Baseline yes   BP (mmHg) 110/76   HR (bpm) 63   02 Sat (%RA) 97 %   Modified Borg Scale for Dyspnea 0- Nothing at all   Perceived Rate of Exertion (Borg) 6-   6 Minute Walk Post Test yes   BP (mmHg) 149/83   HR (bpm) 88   02 Sat (%RA) 95 %   Modified Borg Scale for Dyspnea 2- Mild shortness of breath   Perceived Rate of Exertion (Borg) 13- Somewhat hard   Aerobic Endurance Distance Walked 1310          Objective measurements completed on examination: See above findings.                               Plan - 03/08/17 1545    Clinical Impression Statement see below   PT Frequency One time visit     Clinical Impression Statement: Pt is a 71 yo ale presenting to OP PT for evaluation prior to possible TAVR surgery due to severe aortic stenosis. Pt reports onset of fatigue and shortness of breath approximately 6 months ago. Pt also reports intermittent chest pain. Symptoms are limiting his ability to maintain exercise/gym routine previously established in cardiac rehab. Pt  presents with good ROM and strength, good balance and is not at high fall risk 4 stage balance test, good walking speed and fair aerobic endurance per 6 minute walk  test.  Pt ambulated a total of 1310 feet in 6 minute walk. BP increased significantly with 6 minute walk test. Based on the Short Physical Performance Battery, patient has a frailty rating of 8/12 with </= 5/12 considered frail.   Patient demonstrated the following deficits and impairments:     Visit Diagnosis: Other abnormalities of gait and mobility  Abnormal posture      G-Codes - March 18, 2017 1545    Functional Assessment Tool Used (Outpatient Only) 6 minute walk 1310'   Functional Limitation Mobility: Walking and moving around   Mobility: Walking and Moving Around Current Status [phone]) At least 20 percent but less than 40 percent impaired, limited or restricted   Mobility: Walking and Moving Around Goal Status [phone]) At least 20 percent but less than 40 percent impaired, limited or restricted   Mobility: Walking and Moving Around Discharge Status [phone]) At least 20 percent but less than 40 percent impaired, limited or restricted       Problem List Patient Active Problem List   Diagnosis Date Noted  . Coronary artery disease involving native coronary artery of native heart with angina pectoris ([place]) 10/28/2016  . Bradycardia, drug induced 10/28/2016  . Dyslipidemia 03/31/2016  . Aortic stenosis 03/31/2016  . Mitral regurgitation 03/31/2016  . CAD S/P percutaneous coronary angioplasty 03/31/2016  . Abnormal nuclear stress test   . Heart murmur 03/06/2015  . Abnormal EKG 03/06/2015  . LVH (left ventricular hypertrophy) 03/06/2015  . Rheumatic fever   . Localization-related idiopathic epilepsy and epileptic syndromes with seizures of localized onset, not intractable, without status epilepticus ([place]) 08/23/2014  . Hemorrhoid, thrombosed. 12/24/2011    [place], PT 2017/03/18, 3:46 PM  [facility] [address] [place], [place], [address] Phone: [phone]   Fax:  [phone]  Name: Douglas Lane MRN: [id number] Date of Birth: September 22, 1945

## 2017-03-08 NOTE — Progress Notes (Signed)
VASCULAR LAB PRELIMINARY  PRELIMINARY  PRELIMINARY  PRELIMINARY  Carotid duplex completed.    Preliminary report:  Bilateral - No obvious evidence of extracranial ICA stenosis. Vertebral artery flow is antegrade.  Stephens Shreve, RVS 03/08/2017, 2:21 PM

## 2017-03-16 NOTE — Pre-Procedure Instructions (Signed)
Douglas Lane  03/16/2017      [facility] [address], [address] AT [facility] OF [address] [place] [address] Phone: [phone] Fax: [phone]    Your procedure is scheduled on March 22, 2017.  Report to [facility] Admitting at 530 AM.  Call this number if you have problems the morning of surgery:  [phone]   Remember:  Do not eat food or drink liquids after midnight.  Take these medicines the morning of surgery with A SIP OF WATER levetiracetam (keppra), acetaminophen (tylenol) -if needed  Stop taking Plavix and Brilinta as instructed by your surgeon.  7 days prior to surgery STOP taking any Aspirin (unless otherwise instructed by your surgeon), Aleve, Naproxen, Ibuprofen, Motrin, Advil, Goody's, BC's, all herbal medications, fish oil, and all vitamins   Do not wear jewelry, make-up or nail polish.  Do not wear lotions, powders, or perfumes, or deoderant.  Do not shave 48 hours prior to surgery.  Men may shave face and neck.  Do not bring valuables to the hospital.  [facility] is not responsible for any belongings or valuables.  Contacts, dentures or bridgework may not be worn into surgery.  Leave your suitcase in the car.  After surgery it may be brought to your room.  For patients admitted to the hospital, discharge time will be determined by your treatment team.  Patients discharged the day of surgery will not be allowed to drive home.   Special instructions:   [facility]- Preparing For Surgery  Before surgery, you can play an important role. Because skin is not sterile, your skin needs to be as free of germs as possible. You can reduce the number of germs on your skin by washing with CHG (chlorahexidine gluconate) Soap before surgery.  CHG is an antiseptic cleaner which kills germs and bonds with the skin to continue killing germs even after washing.  Please do not use if you have an  allergy to CHG or antibacterial soaps. If your skin becomes reddened/irritated stop using the CHG.  Do not shave (including legs and underarms) for at least 48 hours prior to first CHG shower. It is OK to shave your face.  Please follow these instructions carefully.   1. Shower the NIGHT BEFORE SURGERY and the MORNING OF SURGERY with CHG.   2. If you chose to wash your hair, wash your hair first as usual with your normal shampoo.  3. After you shampoo, rinse your hair and body thoroughly to remove the shampoo.  4. Use CHG as you would any other liquid soap. You can apply CHG directly to the skin and wash gently with a scrungie or a clean washcloth.   5. Apply the CHG Soap to your body ONLY FROM THE NECK DOWN.  Do not use on open wounds or open sores. Avoid contact with your eyes, ears, mouth and genitals (private parts). Wash genitals (private parts) with your normal soap.  6. Wash thoroughly, paying special attention to the area where your surgery will be performed.  7. Thoroughly rinse your body with warm water from the neck down.  8. DO NOT shower/wash with your normal soap after using and rinsing off the CHG Soap.  9. Pat yourself dry with a CLEAN TOWEL.   10. Wear CLEAN PAJAMAS   11. Place CLEAN SHEETS on your bed the night of your first shower and DO NOT SLEEP  WITH PETS.    Day of Surgery: Do not apply any deodorants/lotions. Please wear clean clothes to the hospital/surgery center.     Please read over the following fact sheets that you were given. Pain Booklet, Coughing and Deep Breathing, MRSA Information and Surgical Site Infection Prevention

## 2017-03-17 ENCOUNTER — Encounter (HOSPITAL_COMMUNITY)
Admission: RE | Admit: 2017-03-17 | Discharge: 2017-03-17 | Disposition: A | Discharge status: Home / Self Care | Payer: Medicare Other | Source: Ambulatory Visit | Attending: Cardiovascular Disease | Admitting: Cardiovascular Disease

## 2017-03-17 ENCOUNTER — Encounter: Payer: Self-pay | Admitting: Cardiothoracic Surgery

## 2017-03-17 ENCOUNTER — Encounter (HOSPITAL_COMMUNITY): Payer: Self-pay | Admitting: *Deleted

## 2017-03-17 ENCOUNTER — Encounter (HOSPITAL_COMMUNITY): Payer: Self-pay

## 2017-03-17 ENCOUNTER — Institutional Professional Consult (permissible substitution) (INDEPENDENT_AMBULATORY_CARE_PROVIDER_SITE_OTHER): Payer: Medicare Other | Admitting: Cardiothoracic Surgery

## 2017-03-17 VITALS — BP 146/76 | HR 62 | Resp 20 | Ht 66.0 in | Wt 177.0 lb

## 2017-03-17 DIAGNOSIS — I350 Nonrheumatic aortic (valve) stenosis: Secondary | ICD-10-CM | POA: Diagnosis not present

## 2017-03-17 DIAGNOSIS — I700 Atherosclerosis of aorta: Secondary | ICD-10-CM | POA: Diagnosis not present

## 2017-03-17 DIAGNOSIS — Z01818 Encounter for other preprocedural examination: Secondary | ICD-10-CM | POA: Diagnosis not present

## 2017-03-17 LAB — URINALYSIS, ROUTINE W REFLEX MICROSCOPIC
BILIRUBIN URINE: NEGATIVE
Glucose, UA: NEGATIVE mg/dL
Hgb urine dipstick: NEGATIVE
Ketones, ur: NEGATIVE mg/dL
Leukocytes, UA: NEGATIVE
NITRITE: NEGATIVE
PROTEIN: NEGATIVE mg/dL
SPECIFIC GRAVITY, URINE: 1.018 (ref 1.005–1.030)
pH: 5 (ref 5.0–8.0)

## 2017-03-17 LAB — TYPE AND SCREEN
ABO/RH(D): O POS
ANTIBODY SCREEN: NEGATIVE

## 2017-03-17 LAB — BLOOD GAS, ARTERIAL
ACID-BASE EXCESS: 0.8 mmol/L (ref 0.0–2.0)
BICARBONATE: 24.3 mmol/L (ref 20.0–28.0)
Drawn by: 449841
FIO2: 21
O2 Saturation: 94.7 %
PATIENT TEMPERATURE: 98.6
pCO2 arterial: 34.6 mmHg (ref 32.0–48.0)
pH, Arterial: 7.46 — ABNORMAL HIGH (ref 7.350–7.450)
pO2, Arterial: 66.7 mmHg — ABNORMAL LOW (ref 83.0–108.0)

## 2017-03-17 LAB — APTT: APTT: 28 s (ref 24–36)

## 2017-03-17 LAB — COMPREHENSIVE METABOLIC PANEL
ALBUMIN: 4 g/dL (ref 3.5–5.0)
ALT: 19 U/L (ref 17–63)
ANION GAP: 7 (ref 5–15)
AST: 28 U/L (ref 15–41)
Alkaline Phosphatase: 68 U/L (ref 38–126)
BUN: 14 mg/dL (ref 6–20)
CO2: 23 mmol/L (ref 22–32)
Calcium: 9 mg/dL (ref 8.9–10.3)
Chloride: 107 mmol/L (ref 101–111)
Creatinine, Ser: 0.82 mg/dL (ref 0.61–1.24)
GFR calc Af Amer: >60 mL/min (ref >60)
GFR calc non Af Amer: >60 mL/min (ref >60)
GLUCOSE: 88 mg/dL (ref 65–99)
POTASSIUM: 4.4 mmol/L (ref 3.5–5.1)
SODIUM: 137 mmol/L (ref 135–145)
Total Bilirubin: 1.6 mg/dL — ABNORMAL HIGH (ref 0.3–1.2)
Total Protein: 6.1 g/dL — ABNORMAL LOW (ref 6.5–8.1)

## 2017-03-17 LAB — CBC
HCT: 43.3 % (ref 39.0–52.0)
Hemoglobin: 14.5 g/dL (ref 13.0–17.0)
MCH: 29.9 pg (ref 26.0–34.0)
MCHC: 33.5 g/dL (ref 30.0–36.0)
MCV: 89.3 fL (ref 78.0–100.0)
PLATELETS: 165 10*3/uL (ref 150–400)
RBC: 4.85 MIL/uL (ref 4.22–5.81)
RDW: 14.2 % (ref 11.5–15.5)
WBC: 6.4 10*3/uL (ref 4.0–10.5)

## 2017-03-17 LAB — PROTIME-INR
INR: 0.98
Prothrombin Time: 13 seconds (ref 11.4–15.2)

## 2017-03-17 LAB — SURGICAL PCR SCREEN
MRSA, PCR: NEGATIVE
STAPHYLOCOCCUS AUREUS: NEGATIVE

## 2017-03-17 LAB — ABO/RH: ABO/RH(D): O POS

## 2017-03-17 NOTE — Progress Notes (Signed)
[address]       ,[place] [address]             [phone]                    Traeton E Ricciardelli [facility] Medical Record #[id number] Date of Birth: 27-Jan-1946  Referring: Sueanne Margarita, MD Primary Care: Lawerance Cruel, MD  Chief Complaint:    Chief Complaint  Patient presents with   Aortic Stenosis    2nd TAVR eval, review all studies, surgery scheduled for 03/22/17    History of Present Illness:    RYKKER COVIELLO 71 y.o. male is seen in the office  today for Second opinion on aortic valve replacement. Patient has been followed for murmur since he was in [place], more recently been followed by Dr. Radford Pax for known aortic stenosis. He notes increasing symptoms with exertion over the past 3-4 months, although he remains fairly active. HE does have stage D, severe, symptomatic aortic stenosis.  with NYHA class 2 symptoms of exertional fatigue and shortness of breath.      Current Activity/ Functional Status:  Patient is independent with mobility/ambulation, transfers, ADL's, IADL's.   Zubrod Score: At the time of surgery this patients most appropriate activity status/level should be described as: []     0    Normal activity, no symptoms []     1    Restricted in physical strenuous activity but ambulatory, able to do out light work []     2    Ambulatory and capable of self care, unable to do work activities, up and about               >50 % of waking hours                              []     3    Only limited self care, in bed greater than 50% of waking hours []     4    Completely disabled, no self care, confined to bed or chair []     5    Moribund   Past Medical History:  Diagnosis Date   Aortic stenosis    a. moderate by echo 03/2016; based on cath 03/2016 it is suspected that much of his gradient is subvalvular.   Arthritis    "pain in my knees" (03/30/2016)   CAD S/P percutaneous coronary angioplasty 03/31/2016   a. CP/[place] -> LHC 03/31/16:  90% prox-mid RCA s/p DES, 40% prox-mid LAD, 25% D2, 25% prox-mid Cx, elevated LVEDP 33.   Chronic diastolic CHF (congestive heart failure) ([place]) 03/31/2016   Chronic lower back pain    Dyslipidemia    Dyspnea    with exertion   Heart murmur    "since I was a kid" (03/30/2016)   LVH (left ventricular hypertrophy)    a. severe basal septal hypertrophy by echo 03/2016.   Mitral regurgitation    a. mild by echo 03/2016.   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    Sleep apnea    Squamous cell cancer of skin of left temple    Thrombosed hemorrhoids     Past Surgical History:  Procedure  Laterality Date   APPENDECTOMY     BACK SURGERY     CARDIAC CATHETERIZATION N/A 03/30/2016   Procedure: Right/Left Heart Cath and Coronary Angiography;  Surgeon: Jettie Booze, MD;  Location: [facility];  Service: Cardiovascular;  Laterality: N/A;   CARDIAC CATHETERIZATION N/A 03/30/2016   Procedure: Coronary Stent Intervention;  Surgeon: Jettie Booze, MD;  Location: [facility];  Service: Cardiovascular;  Laterality: N/A;   COLONOSCOPY     CORONARY ANGIOPLASTY     FOREARM FRACTURE SURGERY Right ~ 2003-2004   "got steel plate in there"   I&D hemorrhoirds     KNEE ARTHROSCOPY Bilateral    MOHS SURGERY Left    temple   ORIF SCAPULAR FRACTURE Right    right   POSTERIOR LUMBAR FUSION  2004   TEE WITHOUT CARDIOVERSION N/A 11/01/2016   Procedure: TRANSESOPHAGEAL ECHOCARDIOGRAM (TEE);  Surgeon: Sanda Klein, MD;  Location: [facility];  Service: Cardiovascular;  Laterality: N/A;   TONSILLECTOMY      Family History  Problem Relation Age of Onset   Renal Disease Mother    Congestive Heart Failure Father    Depression Sister     Social History   Social History   Marital status: Married    Spouse  name: N/A   Number of children: N/A   Years of education: N/A   Occupational History   Not on file.   Social History Main Topics   Smoking status: Never Smoker   Smokeless tobacco: Never Used   Alcohol use 8.4 oz/week    7 Shots of liquor, 7 Cans of beer per week   Drug use: No   Sexual activity: Not Currently   Other Topics Concern   Not on file   Social History Narrative   No narrative on file    History  Smoking Status   Never Smoker  Smokeless Tobacco   Never Used    History  Alcohol Use   8.4 oz/week   7 Shots of liquor, 7 Cans of beer per week     No Known Allergies  Current Outpatient Prescriptions  Medication Sig Dispense Refill   acetaminophen (TYLENOL) 500 MG tablet Take 1,000 mg by mouth daily as needed for moderate pain.      aspirin 81 MG tablet Take 81 mg by mouth daily.     clobetasol ointment (TEMOVATE) 6.50 % Apply 1 application topically daily as needed (skin irritation).   1   clopidogrel (PLAVIX) 75 MG tablet Take 1 tablet (75 mg total) by mouth daily. 90 tablet 3   desonide (DESOWEN) 0.05 % cream Apply 1 application topically daily as needed (psoriasis).      diphenhydrAMINE (BENADRYL) 2 % cream Apply 1 application topically daily as needed for itching.      diphenhydrAMINE (BENADRYL) 25 mg capsule Take 25 mg by mouth daily as needed for allergies.     HUMIRA PEN 40 MG/0.8ML PNKT Inject 40 mg into the skin every 28 (twenty-eight) days.      HYDROCORTISONE EX Apply 1 application topically daily as needed (itching).     levETIRAcetam (KEPPRA) 750 MG tablet Take 1 tablet (750 mg total) by mouth 2 (two) times daily. 180 tablet 3   nitroGLYCERIN (NITROSTAT) 0.4 MG SL tablet Place 1 tablet (0.4 mg total) under the tongue every 5 (five) minutes as needed for chest pain (up to 3 doses). 25 tablet 3   PROCTOZONE-HC 2.5 % rectal cream Apply 1 application topically daily  as needed for hemorrhoids or itching.      rosuvastatin  (CRESTOR) 20 MG tablet Take 1 tablet (20 mg total) by mouth daily. 30 tablet 11   No current facility-administered medications for this visit.     Pertinent items are noted in HPI.   Review of Systems:     Cardiac Review of Systems: Y or N  Chest Pain [  y  ]  Resting SOB [ n  ] Exertional SOB  Blue.Reese  ]  Orthopnea [ n ]   Pedal Edema [ n  ]    Palpitations [ n ] Syncope  [  n]   Presyncope [n   ]  General Review of Systems: [Y] = yes [  ]=no Constitional: recent weight change [  ];  Wt loss over the last 3 months [   ] anorexia [  ]; fatigue [  ]; nausea [  ]; night sweats [  ]; fever [  ]; or chills [  ];          Dental: poor dentition[  ]; Last Dentist visit:   Eye : blurred vision [  ]; diplopia [   ]; vision changes [  ];  Amaurosis fugax[  ]; Resp: cough [  ];  wheezing[  ];  hemoptysis[ n ]; shortness of breath[ y ]; paroxysmal nocturnal dyspnea[  ]; dyspnea on exertion[  ]; or orthopnea[  ];  GI:  gallstones[  ], vomiting[  ];  dysphagia[  ]; melena[n  ];  hematochezia [n  ]; heartburn[  ];   Hx of  Colonoscopy[  ]; GU: kidney stones [  ]; hematuria[  ];   dysuria [  ];  nocturia[  ];  history of     obstruction [  ]; urinary frequency [  ]             Skin: rash, swelling[  ];, hair loss[  ];  peripheral edema[  ];  or itching[  ]; Musculosketetal: myalgias[  ];  joint swelling[  ];  joint erythema[  ];  joint pain[  ];  back pain[  ];  Heme/Lymph: bruising[  ];  bleeding[  ];  anemia[  ];  Neuro: TIA[ n ];  headaches[n  ];  stroke[n  ];  vertigo[  ];  seizures[  ];   paresthesias[  ];  difficulty walking[ y ];  Psych:depression[  ]; anxiety[  ];  Endocrine: diabetes[  ];  thyroid dysfunction[  ];  Immunizations: Flu up to date [  ]; Pneumococcal up to date [  ];  Other:  Physical Exam: BP (!) 146/76    Pulse 62    Resp 20    Ht 5' 6"  (1.676 m)    Wt 177 lb (80.3 kg)    SpO2 97% Comment: RA   BMI 28.57 kg/m   PHYSICAL EXAMINATION: General appearance: alert and  cooperative Head: Normocephalic, without obvious abnormality, atraumatic Neck: no adenopathy, no carotid bruit, no JVD, supple, symmetrical, trachea midline and thyroid not enlarged, symmetric, no tenderness/mass/nodules Lymph nodes: Cervical, supraclavicular, and axillary nodes normal. Resp: clear to auscultation bilaterally Back: symmetric, no curvature. ROM normal. No CVA tenderness. Cardio: regular rate and rhythm, S1, S2 normal, 3/6 harsh systolic murmur best ordered along the right sternal border, click, rub or gallop GI: soft, non-tender; bowel sounds normal; no masses,  no organomegaly Extremities: extremities normal, atraumatic, no cyanosis or edema and Homans sign is negative, no sign of DVT Neurologic:  Grossly normal  Diagnostic Studies & Laboratory data:     Recent Radiology Findings:      Dg Chest 2 View  Result Date: 03/17/2017 CLINICAL DATA:  Preoperative examination prior to aortic valve replacement. History of aortic stenosis, mitral regurgitation, coronary artery disease status post angioplasty, CHF. EXAM: CHEST  2 VIEW COMPARISON:  Chest x-ray of Dec 19, 2015 FINDINGS: The lungs are adequately inflated. There is no focal infiltrate. The heart and pulmonary vascularity are normal. The mediastinum is normal in width. There is calcification in the wall of the aortic arch. There is no pleural effusion. There are multiple old posterior rib fractures on the right. There is multilevel degenerative disc disease of the thoracic spine. IMPRESSION: There is no pneumonia, CHF, nor other acute cardiopulmonary abnormality. Thoracic aortic atherosclerosis. Electronically Signed   By: David  Martinique M.D.   On: 03/17/2017 13:02   Ct Angio Chest Aorta W/cm &/or Wo/cm  Result Date: 03/08/2017 CLINICAL DATA:  71 year old male with history of severe aortic stenosis. Preprocedural study prior to potential transcatheter aortic valve replacement (TAVR) procedure. EXAM: CT ANGIOGRAPHY CHEST, ABDOMEN  AND PELVIS TECHNIQUE: Multidetector CT imaging through the chest, abdomen and pelvis was performed using the standard protocol during bolus administration of intravenous contrast. Multiplanar reconstructed images and MIPs were obtained and reviewed to evaluate the vascular anatomy. CONTRAST:  100 mL of Isovue 370. COMPARISON:  Cardiac CTA 02/02/2017. CT of the chest, abdomen and pelvis 12/21/2011. FINDINGS: CTA CHEST FINDINGS Cardiovascular: Heart size is borderline enlarged with concentric left ventricular hypertrophy. There is no significant pericardial fluid, thickening or pericardial calcification. There is aortic atherosclerosis, as well as atherosclerosis of the great vessels of the mediastinum and the coronary arteries, including calcified atherosclerotic plaque in the left main, left anterior descending, left circumflex and right coronary arteries. Severe thickening calcification of the aortic valve. Mediastinum/Lymph Nodes: No pathologically enlarged mediastinal or hilar lymph nodes. Esophagus is unremarkable in appearance. No axillary lymphadenopathy. Lungs/Pleura: No suspicious-appearing pulmonary nodules or masses. No acute consolidative airspace disease. No pleural effusions. Musculoskeletal/Soft Tissues: There are no aggressive appearing lytic or blastic lesions noted in the visualized portions of the skeleton. Multiple old healed right-sided rib fractures. CTA ABDOMEN AND PELVIS FINDINGS Hepatobiliary: No cystic or solid hepatic lesions. No intra or extrahepatic biliary ductal dilatation. Gallbladder is nearly decompressed, but otherwise unremarkable in appearance. Pancreas: No pancreatic mass. No pancreatic ductal dilatation. No pancreatic or peripancreatic fluid or inflammatory changes. Spleen: Unremarkable. Adrenals/Urinary Tract: In the medial aspect of the interpolar region of the right kidney there is an avidly enhancing mass which measures 2.6 x 3.7 x 3.4 cm (axial image 107 of series 5 and  coronal image 86 of series 8), compatible with a solid renal neoplasm. This is separate from the right renal vein which is widely patent, and is completely encapsulated within Gerota's fascia. 2 cm exophytic simple cyst in the lower pole of the right kidney. 1.4 cm exophytic simple cyst in the interpolar region of the left kidney. No hydroureteronephrosis. Urinary bladder is normal in appearance. Bilateral adrenal glands are normal in appearance. Stomach/Bowel: Normal appearance of the stomach. No pathologic dilatation of small bowel or colon. A few scattered colonic diverticulae are noted, without surrounding inflammatory changes to suggest an acute diverticulitis at this time. Vascular/Lymphatic: Aortic atherosclerosis, with vascular findings and measurements pertinent to potential TAVR procedure, as detailed below. No aneurysm or dissection identified in the abdominal or pelvic vasculature. Celiac axis, superior mesenteric artery and inferior mesenteric artery are  all widely patent without hemodynamically significant stenosis. Single renal artery is are also patent bilaterally. No lymphadenopathy noted in the abdomen or pelvis. Reproductive: Prostate gland and seminal vesicles are unremarkable in appearance. Other: No significant volume of ascites.  No pneumoperitoneum. Musculoskeletal: Status post PLIF from L3-S1 with interbody grafts at L3-L4, L4-L5 and L5-S1. Osteopenia throughout the areas of spinal fixation in the lumbar spine. There are no aggressive appearing lytic or blastic lesions noted in the visualized portions of the skeleton. VASCULAR MEASUREMENTS PERTINENT TO TAVR: AORTA: Minimal Aortic Diameter -  17 x 14 mm Severity of Aortic Calcification -  moderate RIGHT PELVIS: Right Common Iliac Artery - Minimal Diameter - 10.6 x 10.4 mm Tortuosity - mild Calcification - mild Right External Iliac Artery - Minimal Diameter - 9.8 x 9.7 mm Tortuosity - severe Calcification - none Right Common Femoral Artery -  Minimal Diameter - 10.6 x 8.6 mm Tortuosity - mild Calcification - mild LEFT PELVIS: Left Common Iliac Artery - Minimal Diameter - 12.0 x 10.7 mm Tortuosity - mild Calcification - mild Left External Iliac Artery - Minimal Diameter - 9.2 x 9.9 mm Tortuosity - severe Calcification - none Left Common Femoral Artery - Minimal Diameter - 10.5 x 9.2 mm Tortuosity - mild Calcification - mild Review of the MIP images confirms the above findings. IMPRESSION: 1. Vascular findings and measurements pertinent to potential TAVR procedure, as detailed above. This patient does appear to have suitable pelvic arterial access bilaterally, particularly in terms of vascular size, however, please note the extreme tortuosity of the external iliac arteries bilaterally. 2. Severe thickening calcification of the aortic valve, compatible with the reported clinical history of severe aortic stenosis. 3. Concentric left ventricular hypertrophy. 4. Aortic atherosclerosis, in addition to left main and 3 vessel coronary artery disease. Assessment for potential risk factor modification, dietary therapy or pharmacologic therapy may be warranted, if clinically indicated. 5. Avidly enhancing mass in the medial aspect of the interpolar region of the right kidney. In retrospect, there was likely a lesion of similar size on prior CT of the chest, abdomen and pelvis 12/21/2011, however, at the time of that examination the contrast bolus was so suboptimal that the lesion was not readily apparent. 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 This lesion is completely encapsulated within Gerota's fascia and does not involve the right renal vein at this time. Accordingly, follow-up CT of the abdomen with and without IV contrast is recommended in 6 months to reassess this lesion and ensure stability. 6. Additional incidental findings, as above. Aortic  Atherosclerosis (ICD10-I70.0). Electronically Signed   By: Vinnie Langton M.D.   On: 03/08/2017 14:17   Ct Angio Abd/pel W/ And/or W/o  Result Date: 03/08/2017 CLINICAL DATA:  71 year old male with history of severe aortic stenosis. Preprocedural study prior to potential transcatheter aortic valve replacement (TAVR) procedure. EXAM: CT ANGIOGRAPHY CHEST, ABDOMEN AND PELVIS TECHNIQUE: Multidetector CT imaging through the chest, abdomen and pelvis was performed using the standard protocol during bolus administration of intravenous contrast. Multiplanar reconstructed images and MIPs were obtained and reviewed to evaluate the vascular anatomy. CONTRAST:  100 mL of Isovue 370. COMPARISON:  Cardiac CTA 02/02/2017. CT of the chest, abdomen and pelvis 12/21/2011. FINDINGS: CTA CHEST FINDINGS Cardiovascular: Heart size is borderline enlarged with concentric left ventricular hypertrophy. There is no significant pericardial fluid, thickening or pericardial calcification. There is aortic atherosclerosis, as well as  atherosclerosis of the great vessels of the mediastinum and the coronary arteries, including calcified atherosclerotic plaque in the left main, left anterior descending, left circumflex and right coronary arteries. Severe thickening calcification of the aortic valve. Mediastinum/Lymph Nodes: No pathologically enlarged mediastinal or hilar lymph nodes. Esophagus is unremarkable in appearance. No axillary lymphadenopathy. Lungs/Pleura: No suspicious-appearing pulmonary nodules or masses. No acute consolidative airspace disease. No pleural effusions. Musculoskeletal/Soft Tissues: There are no aggressive appearing lytic or blastic lesions noted in the visualized portions of the skeleton. Multiple old healed right-sided rib fractures. CTA ABDOMEN AND PELVIS FINDINGS Hepatobiliary: No cystic or solid hepatic lesions. No intra or extrahepatic biliary ductal dilatation. Gallbladder is nearly decompressed, but otherwise  unremarkable in appearance. Pancreas: No pancreatic mass. No pancreatic ductal dilatation. No pancreatic or peripancreatic fluid or inflammatory changes. Spleen: Unremarkable. Adrenals/Urinary Tract: In the medial aspect of the interpolar region of the right kidney there is an avidly enhancing mass which measures 2.6 x 3.7 x 3.4 cm (axial image 107 of series 5 and coronal image 86 of series 8), compatible with a solid renal neoplasm. This is separate from the right renal vein which is widely patent, and is completely encapsulated within Gerota's fascia. 2 cm exophytic simple cyst in the lower pole of the right kidney. 1.4 cm exophytic simple cyst in the interpolar region of the left kidney. No hydroureteronephrosis. Urinary bladder is normal in appearance. Bilateral adrenal glands are normal in appearance. Stomach/Bowel: Normal appearance of the stomach. No pathologic dilatation of small bowel or colon. A few scattered colonic diverticulae are noted, without surrounding inflammatory changes to suggest an acute diverticulitis at this time. Vascular/Lymphatic: Aortic atherosclerosis, with vascular findings and measurements pertinent to potential TAVR procedure, as detailed below. No aneurysm or dissection identified in the abdominal or pelvic vasculature. Celiac axis, superior mesenteric artery and inferior mesenteric artery are all widely patent without hemodynamically significant stenosis. Single renal artery is are also patent bilaterally. No lymphadenopathy noted in the abdomen or pelvis. Reproductive: Prostate gland and seminal vesicles are unremarkable in appearance. Other: No significant volume of ascites.  No pneumoperitoneum. Musculoskeletal: Status post PLIF from L3-S1 with interbody grafts at L3-L4, L4-L5 and L5-S1. Osteopenia throughout the areas of spinal fixation in the lumbar spine. There are no aggressive appearing lytic or blastic lesions noted in the visualized portions of the skeleton. VASCULAR  MEASUREMENTS PERTINENT TO TAVR: AORTA: Minimal Aortic Diameter -  17 x 14 mm Severity of Aortic Calcification -  moderate RIGHT PELVIS: Right Common Iliac Artery - Minimal Diameter - 10.6 x 10.4 mm Tortuosity - mild Calcification - mild Right External Iliac Artery - Minimal Diameter - 9.8 x 9.7 mm Tortuosity - severe Calcification - none Right Common Femoral Artery - Minimal Diameter - 10.6 x 8.6 mm Tortuosity - mild Calcification - mild LEFT PELVIS: Left Common Iliac Artery - Minimal Diameter - 12.0 x 10.7 mm Tortuosity - mild Calcification - mild Left External Iliac Artery - Minimal Diameter - 9.2 x 9.9 mm Tortuosity - severe Calcification - none Left Common Femoral Artery - Minimal Diameter - 10.5 x 9.2 mm Tortuosity - mild Calcification - mild Review of the MIP images confirms the above findings. IMPRESSION: 1. Vascular findings and measurements pertinent to potential TAVR procedure, as detailed above. This patient does appear to have suitable pelvic arterial access bilaterally, particularly in terms of vascular size, however, please note the extreme tortuosity of the external iliac arteries bilaterally. 2. Severe thickening calcification of the aortic valve, compatible with the  reported clinical history of severe aortic stenosis. 3. Concentric left ventricular hypertrophy. 4. Aortic atherosclerosis, in addition to left main and 3 vessel coronary artery disease. Assessment for potential risk factor modification, dietary therapy or pharmacologic therapy may be warranted, if clinically indicated. 5. Avidly enhancing mass in the medial aspect of the interpolar region of the right kidney. In retrospect, there was likely a lesion of similar size on prior CT of the chest, abdomen and pelvis 12/21/2011, however, at the time of that examination the contrast bolus was so suboptimal that the lesion was not readily apparent. Accordingly, although this is likely a solid neoplasm, this is likely to represent a benign lesion  such as a lipid poor angiomyolipoma or oncocytoma. This lesion is completely encapsulated within Gerota's fascia and does not involve the right renal vein at this time. Accordingly, follow-up CT of the abdomen with and without IV contrast is recommended in 6 months to reassess this lesion and ensure stability. 6. Additional incidental findings, as above. Aortic Atherosclerosis (ICD10-I70.0). Electronically Signed   By: Vinnie Langton M.D.   On: 03/08/2017 14:17     I have independently reviewed the above radiologic studies.  Recent Lab Findings: Lab Results  Component Value Date   WBC 6.4 03/17/2017   HGB 14.5 03/17/2017   HCT 43.3 03/17/2017   PLT 165 03/17/2017   GLUCOSE 88 03/17/2017   CHOL 130 08/06/2016   TRIG 82 08/06/2016   HDL 50 08/06/2016   LDLCALC 64 08/06/2016   ALT 19 03/17/2017   AST 28 03/17/2017   NA 137 03/17/2017   K 4.4 03/17/2017   CL 107 03/17/2017   CREATININE 0.82 03/17/2017   BUN 14 03/17/2017   CO2 23 03/17/2017   INR 0.98 03/17/2017       *[facility]*                        [address], [place] [address]                            [phone]  ------------------------------------------------------------------- Transthoracic Echocardiography  Patient:    Kemo, Spruce MR #:       [id number] Study Date: 10/21/2016 Gender:     M Age:        65 Height:     166.4 cm Weight:     79.4 kg BSA:        1.94 m^2 Pt. Status: Room:   ORDERING     Fransico Him, MD  REFERRING    Fransico Him, MD  ATTENDING    Dorris Carnes, M.D.  PERFORMING   [facility], Outpatient  SONOGRAPHER  Bethany McMahill, RDCS  cc:  ------------------------------------------------------------------- LV EF: 55% -   60%  ------------------------------------------------------------------- Indications:      CAD (I25.10).  ------------------------------------------------------------------- History:   PMH:  Seizures, Rheumatic Fever  (as child)  Murmur. Coronary artery disease.  Congestive heart failure.  Aortic valve disease.  Risk factors:  Family history of coronary artery disease.   ------------------------------------------------------------------- Study Conclusions  - Left ventricle: The cavity size was normal. Wall thickness was   increased in a pattern of mild LVH. Systolic function was normal.   The estimated ejection fraction was in the range of 55% to 60%.   The  study is not technically sufficient to allow evaluation of LV   diastolic function. - Aortic valve: AV is thickened, calcified with restricted motion   Peak and mean gradients through the valve are 57 to 37 mm Hg   respectively consistent with moderate to severe AS. There is   trivial AI. SInce echo report of August 2017 this is very mildly   increased. - Mitral valve: There was mild regurgitation. - Left atrium: The atrium was mildly dilated.  ------------------------------------------------------------------- Study data:  Comparison was made to the study of 03/19/2016.  Study status:  Routine.  Procedure:  Transthoracic echocardiography. Image quality was adequate.          Transthoracic echocardiography.  M-mode, complete 2D, 3D, spectral Doppler, and color Doppler.  Birthdate:  Patient birthdate: 10-22-1945.  Age: Patient is 71 yr old.  Sex:  Gender: male.    BMI: 28.7 kg/m^2. Blood pressure:     138/78  Patient status:  Outpatient.  Study date:  Study date: 10/21/2016. Study time: 09:21 AM.  Location: [facility]  -------------------------------------------------------------------  ------------------------------------------------------------------- Left ventricle:  The cavity size was normal. Wall thickness was increased in a pattern of mild LVH. Systolic function was normal. The estimated ejection fraction was in the range of 55% to 60%. The study is not technically sufficient to allow evaluation of LV diastolic  function.  ------------------------------------------------------------------- Aortic valve:  AV is thickened, calcified with restricted motion Peak and mean gradients through the valve are 57 to 37 mm Hg respectively consistent with moderate to severe AS. There is trivial AI. SInce echo report of August 2017 this is very mildly increased.  Doppler:     VTI ratio of LVOT to aortic valve: 0.26. Valve area (VTI): 1.7 cm^2. Indexed valve area (VTI): 0.88 cm^2/m^2. Peak velocity ratio of LVOT to aortic valve: 0.25. Valve area (Vmax): 1.65 cm^2. Indexed valve area (Vmax): 0.85 cm^2/m^2. Mean velocity ratio of LVOT to aortic valve: 0.22. Valve area (Vmean): 1.44 cm^2. Indexed valve area (Vmean): 0.74 cm^2/m^2. Mean gradient (S): 34 mm Hg. Peak gradient (S): 55 mm Hg.  ------------------------------------------------------------------- Mitral valve:   Mildly thickened leaflets . Leaflet separation was normal.  Doppler:  Transvalvular velocity was within the normal range. There was no evidence for stenosis. There was mild regurgitation.  ------------------------------------------------------------------- Left atrium:  The atrium was mildly dilated.  ------------------------------------------------------------------- Right ventricle:  The cavity size was normal. Wall thickness was normal. Systolic function was normal.  ------------------------------------------------------------------- Pulmonic valve:    Structurally normal valve.   Cusp separation was normal.  Doppler:  Transvalvular velocity was within the normal range. There was no regurgitation.  ------------------------------------------------------------------- Tricuspid valve:   Structurally normal valve.   Leaflet separation was normal.  Doppler:  Transvalvular velocity was within the normal range. There was trivial regurgitation.  ------------------------------------------------------------------- Right atrium:  The  atrium was normal in size.  ------------------------------------------------------------------- Pericardium:  There was no pericardial effusion.  ------------------------------------------------------------------- Systemic veins: Inferior vena cava: The vessel was normal in size. The respirophasic diameter changes were in the normal range (= 50%), consistent with normal central venous pressure.  ------------------------------------------------------------------- Measurements   Left ventricle                           Value          Reference  LV ID, ED, PLAX chordal                  47.4  mm       43 - 52  LV ID, ES, PLAX chordal                  28.6  mm       23 - 38  LV fx shortening, PLAX chordal           40    %        >=29  LV PW thickness, ED                      13    mm       ----------  IVS/LV PW ratio, ED                      1.05           <=1.3  Stroke volume, 2D                        169   ml       ----------  Stroke volume/bsa, 2D                    87    ml/m^2   ----------  LV e&', lateral                           7.51  cm/s     ----------  LV E/e&', lateral                         7.5            ----------  LV e&', medial                            4.46  cm/s     ----------  LV E/e&', medial                          12.62          ----------  LV e&', average                           5.99  cm/s     ----------  LV E/e&', average                         9.41           ----------    Ventricular septum                       Value          Reference  IVS thickness, ED                        13.7  mm       ----------    LVOT                                     Value          Reference  LVOT ID, S  29    mm       ----------  LVOT area                                6.61  cm^2     ----------  LVOT peak velocity, S                    92.4  cm/s     ----------  LVOT mean velocity, S                    59.5  cm/s     ----------  LVOT  VTI, S                              25.5  cm       ----------    Aortic valve                             Value          Reference  Aortic valve peak velocity, S            370   cm/s     ----------  Aortic valve mean velocity, S            273   cm/s     ----------  Aortic valve VTI, S                      99.4  cm       ----------  Aortic mean gradient, S                  34    mm Hg    ----------  Aortic peak gradient, S                  55    mm Hg    ----------  VTI ratio, LVOT/AV                       0.26           ----------  Aortic valve area, VTI                   1.7   cm^2     ----------  Aortic valve area/bsa, VTI               0.88  cm^2/m^2 ----------  Velocity ratio, peak, LVOT/AV            0.25           ----------  Aortic valve area, peak velocity         1.65  cm^2     ----------  Aortic valve area/bsa, peak              0.85  cm^2/m^2 ----------  velocity  Velocity ratio, mean, LVOT/AV            0.22           ----------  Aortic valve area, mean velocity         1.44  cm^2     ----------  Aortic valve area/bsa, mean              0.74  cm^2/m^2 ----------  velocity  Aortic regurg pressure half-time         719   ms       ----------    Aorta                                    Value          Reference  Aortic root ID, ED                       40    mm       ----------  Ascending aorta ID, A-P, S               34    mm       ----------    Left atrium                              Value          Reference  LA ID, A-P, ES                           46    mm       ----------  LA ID/bsa, A-P                   (H)     2.37  cm/m^2   <=2.2  LA volume, S                             55.8  ml       ----------  LA volume/bsa, S                         28.8  ml/m^2   ----------  LA volume, ES, 1-p A4C                   32.1  ml       ----------  LA volume/bsa, ES, 1-p A4C               16.6  ml/m^2   ----------  LA volume, ES, 1-p A2C                   86.8  ml       ----------   LA volume/bsa, ES, 1-p A2C               44.8  ml/m^2   ----------    Mitral valve                             Value          Reference  Mitral E-wave peak velocity              56.3  cm/s     ----------  Mitral A-wave peak velocity              54.3  cm/s     ----------  Mitral deceleration time         (H)     338   ms       150 - 230  Mitral E/A ratio, peak  1              ----------    Right atrium                             Value          Reference  RA ID, S-I, ES, A4C              (H)     50.8  mm       34 - 49  RA area, ES, A4C                         17.4  cm^2     8.3 - 19.5  RA volume, ES, A/L                       50.1  ml       ----------  RA volume/bsa, ES, A/L                   25.9  ml/m^2   ----------    Right ventricle                          Value          Reference  TAPSE                                    17.8  mm       ----------  RV s&', lateral, S                        15.8  cm/s     ----------  Legend: (L)  and  (H)  mark values outside specified reference range.  ------------------------------------------------------------------- Prepared and Electronically Authenticated by  Dorris Carnes, M.D. 2018-03-08T21:29:19 Procedures   Coronary Stent Intervention  Right/Left Heart Cath and Coronary Angiography  Conclusion     Prox RCA to Mid RCA lesion, 90 %stenosed. A STENT SYNERGY DES [id number] drug eluting stent was successfully placed.  Post intervention, there is a 0% residual stenosis.  LV end diastolic pressure is moderately elevated.  There is moderate aortic valve stenosis. There is a large gradient across the aortic valve, but based on the echo and how easily the catheter crossed the valve, I suspect much of the gradient is subvalvular.  There is no pulmonic valve stenosis.  Cardiac index 2.6. Normal pulmonary artery pressures.   Continue dual antiplatelet therapy. He is a candidate for the TWILIGHT study.  I spoke with Dr.  Radford Pax during the case regarding the aortic stenosis. There is a large gradient present but is mostly subvalvular most likely. The pigtail catheter in [place] catheter across the valve without even a wire.  He'll need diuresis as well to help with his elevated LVEDP.   Indications   Abnormal nuclear stress test [R94.39 (ICD-10-CM)]  Aortic stenosis [I35.0 (ICD-10-CM)]  Procedural Details/Technique   Technical Details The risks, benefits, and details of the procedure were explained to the patient. The patient verbalized understanding and wanted to proceed. Informed written consent was obtained.  PROCEDURE TECHNIQUE: After Xylocaine anesthesia, a 7 French sheath was placed in the right common femoral vein. A 7 French balloontipped Swan-Ganz catheter was advanced to the pulmonary  artery under fluoroscopic guidance. Hemodynamic pressures were obtained. Oxygen saturations were obtained. After Xylocaine anesthesia, a 86F sheath was placed in the right radial artery with a single anterior needle wall stick. Right coronary angiography was done using a Judkins R4 guide catheter. Left coronary angiography was done using a Judkins L3.5 guide catheter. Left heart cath was done using a pigtail catheter.   IV Heparin and tirofiban were given for anticoagulation. Oral Brilinta was given at the end of the case. A JR4 guide catheter was used to engage the RCA. A pro-water wire was placed across the area disease. A 2.25 balloon was used to predilate. A 3.0 x 38 Synergy drug-eluting stent was deployed at high pressure. A 3.5 noncompliant balloon was used to post dilate. There is an excellent angiographic result. There is no residual stenosis.      Contrast: 100     Estimated blood loss <50 mL. . During this procedure the patient was administered the following to achieve and maintain moderate conscious sedation: Versed 4 mg, Fentanyl 75 mcg, while the patient's heart rate, blood pressure, and oxygen saturation were  continuously monitored. The period of conscious sedation was 100 minutes, of which I was present face-to-face 100% of this time.    Complications   Complications documented before study signed (03/30/2016 10:23 AM EDT)    No complications were associated with this study.  Documented by Jettie Booze, MD - 03/30/2016 10:13 AM EDT    Coronary Findings   Dominance: Right  Left Anterior Descending  Prox LAD to Mid LAD lesion, 40% stenosed.  Second Diagonal [employer] 2nd Diag to 2nd Diag lesion, 25% stenosed.  Left Circumflex  Ost Cx to Prox Cx lesion, 25% stenosed.  Right Coronary Artery  Prox RCA to Mid RCA lesion, 90% stenosed.  Angioplasty: Pre-stent angioplasty was performed using a BALLOON EMERGE MR 2.25X20. Maximum pressure: 14 atm. A STENT SYNERGY DES [id number] drug eluting stent was successfully placed. Stent strut is well apposed. Post-stent angioplasty was performed using a BALLOON [place] EMERGE MR 3.5X20. Maximum pressure: 18 atm. The pre-interventional distal flow is normal (TIMI 3). The post-interventional distal flow is normal (TIMI 3). The intervention was successful . No complications occurred at this lesion.  There is no residual stenosis post intervention.  Right Posterior Descending Artery  RPDA lesion, 50% stenosed.  Right Heart   Right Heart Pressures Elevated LV EDP consistent with volume overload.    Right Atrium Right atrial pressure is elevated.    Pulmonic Valve There is no pulmonic valve stenosis.    Left Heart   Left Ventricle LV end diastolic pressure is moderately elevated.    Aortic Valve There is moderate aortic valve stenosis. There is a large gradient across the aortic valve, but based on the echo and how easily the catheter crossed the valve, I suspect much of the gradient is subvalvular.    Coronary Diagrams   Diagnostic Diagram       Post-Intervention Diagram       Implants     Permanent Stent  Stent Synergy Des 3x38 - [id number] -  Implanted    Inventory item: Stent Synergy Des 3x38 Model/Cat number: [id number]  Manufacturer: [employer] Lot number: [id number]  Device identifier: [id number] Device identifier type: GS1  GUDID Information   Request status Successful    Brand name: SYNERGY Version/Model: [id number]  Company name: [employer] MRI safety info as of 03/30/16: MR Conditional  Contains dry or latex rubber:  No    GMDN P.T. name: Coronary angioplasty balloon catheter, basic    As of 03/30/2016   Status: Implanted      PACS Images   Show images for Cardiac catheterization   Link to Procedure Log   Procedure Log    Hemo Data    Most Recent Value  Fick Cardiac Output 5.17 L/min  Fick Cardiac Output Index 2.68 (L/min)/BSA  Aortic Mean Gradient 41.8 mmHg  Aortic Peak Gradient 53 mmHg  Aortic Valve Area 0.85  Aortic Value Area Index 0.44 cm2/BSA  RA A Wave 11 mmHg  RA V Wave 11 mmHg  RA Mean 9 mmHg  RV Systolic Pressure 29 mmHg  RV Diastolic Pressure 3 mmHg  RV EDP 9 mmHg  PA Systolic Pressure 30 mmHg  PA Diastolic Pressure 8 mmHg  PA Mean 17 mmHg  PW A Wave 24 mmHg  PW V Wave 21 mmHg  PW Mean 17 mmHg  AO Systolic Pressure 356 mmHg  AO Diastolic Pressure 69 mmHg  AO Mean 96 mmHg  LV Systolic Pressure 701 mmHg  LV Diastolic Pressure 19 mmHg  LV EDP 33 mmHg  Arterial Occlusion Pressure Extended Systolic Pressure 410 mmHg  Arterial Occlusion Pressure Extended Diastolic Pressure 70 mmHg  Arterial Occlusion Pressure Extended Mean Pressure 95 mmHg  Left Ventricular Apex Extended Systolic Pressure 301 mmHg  Left Ventricular Apex Extended Diastolic Pressure 11 mmHg  Left Ventricular Apex Extended EDP Pressure 27 mmHg  QP/QS 1  TPVR Index 6.34 HRUI  TSVR Index 35.81 HRUI  TPVR/TSVR Ratio 0.18    I have independently reviewed the above  cath films and reviewed the findings with the  patient . Cath was one year ago.    Assessment / Plan:   Moderately  severe aortic stenosis with symptoms - with the patient's increasing symptoms of congestive heart failure in light of moderate to moderate severe aortic stenosis the patient does intervention to correct his aortic stenosis. He has not a high risk surgical patient, but with his knee problems mobility in the postoperative period is an issue. The patient understands that both open surgery and A half specific risk, he is aware that TVAR  a presents slightly higher risk of perivalvular leak, stroke and heart block.  His cath was approximately one year ago, when a stent was placed in the right coronary artery, since he has had no "ischemic event".  The patient has reviewed the risks and options of treatment with Dr. Mohammed Kindle and Dr. Burt Knack and wishes to proceed with TVAR.    Question TH:YHOOI neoplasm, right kidney CT scan in 6 months is suggested by radiology   I  spent 30 minutes counseling the patient face to face and 50% or more the  time was spent in counseling and coordination of care. The total time spent in the appointment was 55 minutes.  Grace Isaac MD      [address] [place] [address] Office [phone]   Beeper [phone]  03/17/2017 1:44 PM

## 2017-03-17 NOTE — Progress Notes (Addendum)
Douglas Lane reports that he has slight chest discomfort and shortness of breath when he exerts himself, "it goes away when I stop."  I spoke to [place] at Dr. Vivi Martens office  who said that patient should stay on Aspirin and Plavix until am of surgery

## 2017-03-18 LAB — HEMOGLOBIN A1C
Hgb A1c MFr Bld: 5.5 % (ref 4.8–5.6)
Mean Plasma Glucose: 111 mg/dL

## 2017-03-21 MED ORDER — MAGNESIUM SULFATE 50 % IJ SOLN
40.0000 meq | INTRAMUSCULAR | Status: DC
  Filled 2017-03-21: qty 10

## 2017-03-21 MED ORDER — DOPAMINE-DEXTROSE 3.2-5 MG/ML-% IV SOLN
0.0000 ug/kg/min | INTRAVENOUS | Status: DC
  Filled 2017-03-21: qty 250

## 2017-03-21 MED ORDER — DEXTROSE 5 % IV SOLN
0.0000 ug/min | INTRAVENOUS | Status: DC
  Filled 2017-03-21: qty 4

## 2017-03-21 MED ORDER — VANCOMYCIN HCL 10 G IV SOLR
1250.0000 mg | INTRAVENOUS | Status: AC
  Administered 2017-03-22: 1250 mg via INTRAVENOUS
  Filled 2017-03-21: qty 1250

## 2017-03-21 MED ORDER — NOREPINEPHRINE BITARTRATE 1 MG/ML IV SOLN
0.0000 ug/min | INTRAVENOUS | Status: DC
  Filled 2017-03-21: qty 4

## 2017-03-21 MED ORDER — SODIUM CHLORIDE 0.9 % IV SOLN
30.0000 ug/min | INTRAVENOUS | Status: DC
Start: 2017-03-22 — End: 2017-03-22
  Filled 2017-03-21: qty 2

## 2017-03-21 MED ORDER — POTASSIUM CHLORIDE 2 MEQ/ML IV SOLN
80.0000 meq | INTRAVENOUS | Status: DC
  Filled 2017-03-21: qty 40

## 2017-03-21 MED ORDER — SODIUM CHLORIDE 0.9 % IV SOLN
INTRAVENOUS | Status: DC
Start: 2017-03-22 — End: 2017-03-22

## 2017-03-21 MED ORDER — DEXMEDETOMIDINE HCL IN NACL 400 MCG/100ML IV SOLN
0.1000 ug/kg/h | INTRAVENOUS | Status: AC
  Administered 2017-03-22: 1.5 ug/kg/h via INTRAVENOUS
  Filled 2017-03-21: qty 100

## 2017-03-21 MED ORDER — NITROGLYCERIN IN D5W 200-5 MCG/ML-% IV SOLN
2.0000 ug/min | INTRAVENOUS | Status: DC
  Filled 2017-03-21: qty 250

## 2017-03-21 MED ORDER — CHLORHEXIDINE GLUCONATE 0.12 % MT SOLN
15.0000 mL | Freq: Once | OROMUCOSAL | Status: AC
  Administered 2017-03-22: 15 mL via OROMUCOSAL
  Filled 2017-03-21: qty 15

## 2017-03-21 MED ORDER — DEXTROSE 5 % IV SOLN
1.5000 g | INTRAVENOUS | Status: AC
  Administered 2017-03-22: 1.5 g via INTRAVENOUS
  Filled 2017-03-21: qty 1.5

## 2017-03-21 MED ORDER — SODIUM CHLORIDE 0.9 % IV SOLN
INTRAVENOUS | Status: DC
  Filled 2017-03-21: qty 30

## 2017-03-21 MED ORDER — SODIUM CHLORIDE 0.9 % IV SOLN
INTRAVENOUS | Status: DC
  Filled 2017-03-21: qty 1

## 2017-03-21 NOTE — H&P (Signed)
[address]       [place] [address]             [phone]      Cardiothoracic Surgical Admission History and Physical   PCP is Duane Lope, MD Referring Provider is Quintella Reichert, MD      Chief Complaint  Patient presents with  . Aortic Stenosis        HPI:  The patient is a 71 year old gentleman with dyslipidemia, CAD s/p PCI of a 90% proximal to mid RCA with DES in 03/2016, and known aortic stenosis. Prior to his cath and PCI he presented with exertional chest discomfort and shortness of breath. An echo in 02/2015 has shown moderate AS with a mean gradient of 31 mm Hg. He had a gated nuclear stress test on 03/19/2016 which was an intermediate risk study with an inferior infarct and no reversible ischemia prompting the cath and subsequent PCI. He also had 40% diffuse proximal LAD narrowing and 50% ostial PDA stenosis. The mean AV gradient was 41.3 mm Hg with an AVA of 0.85. The valve was easy to cross so there was some concern about the gradient being in the subvalvular region. His 2D echo on 03/19/2016 had shown a mean gradient of 34 mm Hg with an LVOT peak gradient of only 3 mm Hg. He has done well since his PCI. He says that he has occasional exertional chest pressure but nothing like before. He has some fatigue and shortness of breath with exertion such as walking up hills or walking his wild dog but he stops and it resolves quickly. He has had no dizziness or syncope. He had a repeat 2D echo on 10/21/2016 showing a mean gradient of 34 mm Hg and a DI of 0.22. The AV is thickened and calcified with restricted motion. A TEE was performed on 11/01/2016 and this showed severe AS with a mean gradient of 53 mm Hg and a peak of 92 mm Hg. There was mild AI. The LVOT peak gradient was only 4 mm Hg. There was no subvalvular obstruction. The LVEF was normal at 55-60%.  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 He is  married and lives with his wife. He can still do work around [employer] and mow his yard. He walks his dog. He has been under a fair amount of stress taking care of his wife's parents who are over 38 years old.      Past Medical History:  Diagnosis Date  . Aortic stenosis    a. moderate by echo 03/2016; based on cath 03/2016 it is suspected that much of his gradient is subvalvular.  . Arthritis    "pain in my knees" (03/30/2016)  . CAD S/P percutaneous coronary angioplasty 03/31/2016   a. CP/USA -> LHC 03/31/16: 90% prox-mid RCA s/p DES, 40% prox-mid LAD, 25% D2, 25% prox-mid Cx, elevated LVEDP 33.  . Chronic diastolic CHF (congestive heart failure) (HCC) 03/31/2016  . Chronic lower back pain   . Dyslipidemia   . Heart murmur    "since I was a kid" (03/30/2016)  . LVH (left ventricular hypertrophy)    a. severe basal septal hypertrophy by echo 03/2016.  . Mitral regurgitation    a. mild by echo 03/2016.  Marland Kitchen Psoriasis   . Rheumatic fever    as a child  . Seizure (HCC) 01/2011; 07/2011; 07/2015      "  idiopathic; been on Kepra since 07/2011"  . Sinus bradycardia - baseline HR 50s at times.   . Sleep apnea   . 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 Thrombosed hemorrhoids          Past Surgical History:  Procedure Laterality Date  . APPENDECTOMY    . BACK SURGERY    . CARDIAC CATHETERIZATION N/A 03/30/2016   Procedure: Right/Left Heart Cath and Coronary Angiography;  Surgeon: Corky Crafts, MD;  Location: [facility] INVASIVE CV LAB;  Service: Cardiovascular;  Laterality: N/A;  . CARDIAC CATHETERIZATION N/A 03/30/2016   Procedure: Coronary Stent Intervention;  Surgeon: Corky Crafts, MD;  Location: [facility] INVASIVE CV LAB;  Service: Cardiovascular;  Laterality: N/A;  . CORONARY ANGIOPLASTY    . FOREARM FRACTURE SURGERY  ~ 2003-2004   "got steel plate in there"  . I&D hemorrhoirds    . KNEE ARTHROSCOPY Bilateral   . MOHS SURGERY Left    temple   . ORIF SCAPULAR FRACTURE Right    right  . POSTERIOR LUMBAR FUSION  2004  . TEE WITHOUT CARDIOVERSION N/A 11/01/2016   Procedure: TRANSESOPHAGEAL ECHOCARDIOGRAM (TEE);  Surgeon: Thurmon Fair, MD;  Location: [facility] ENDOSCOPY;  Service: Cardiovascular;  Laterality: N/A;  . TONSILLECTOMY           Family History  Problem Relation Age of Onset  . Renal Disease Mother   . Congestive Heart Failure Father   . Depression Sister     Social History       Social History   Substance Use Topics   . Smoking status: Never Smoker   . Smokeless tobacco: Never Used   . Alcohol use 9.0 oz/week    10 Cans of beer, 5 Shots of liquor per week          Current Outpatient Prescriptions  Medication Sig Dispense Refill  . acetaminophen (TYLENOL) 500 MG tablet Take 1,000 mg by mouth every 8 (eight) hours as needed.    Marland Kitchen aspirin 81 MG tablet Take 81 mg by mouth daily.    . clobetasol ointment (TEMOVATE) 0.05 % Apply 1 application topically as needed (skin irritation). Daily  1  . desonide (DESOWEN) 0.05 % cream Apply 1 application topically as needed.    . diphenhydrAMINE (BENADRYL) 2 % cream Apply topically as needed for itching.    Marland Kitchen HUMIRA PEN 40 MG/0.8ML PNKT Inject 40 mg into the skin every 28 (twenty-eight) days.     Marland Kitchen levETIRAcetam (KEPPRA) 750 MG tablet Take 1 tablet (750 mg total) by mouth 2 (two) times daily. 180 tablet 3  . nitroGLYCERIN (NITROSTAT) 0.4 MG SL tablet Place 1 tablet (0.4 mg total) under the tongue every 5 (five) minutes as needed for chest pain (up to 3 doses). 25 tablet 3  . PROCTOZONE-HC 2.5 % rectal cream Apply 1 application topically daily as needed for hemorrhoids or itching.     . rosuvastatin (CRESTOR) 20 MG tablet Take 1 tablet (20 mg total) by mouth daily. 30 tablet 11  . ticagrelor (BRILINTA) 90 MG TABS tablet Take 1 tablet (90 mg total) by mouth 2 (two) times daily. 180 tablet 3   No current facility-administered medications for this  visit.     No Known Allergies  Review of Systems  Constitutional: Positive for activity change and fatigue. Negative for appetite change, chills, fever and unexpected weight change.  HENT: Negative.        Sees his dentist every six months  Eyes:  Floaters  Respiratory: Positive for shortness of breath.   Cardiovascular: Negative for palpitations and leg swelling.       Occasional chest tightness  Gastrointestinal: Negative.   Endocrine: Negative.   Genitourinary: Negative.   Musculoskeletal: Positive for arthralgias and joint swelling.  Skin: Negative.   Allergic/Immunologic: Negative.   Neurological: Positive for seizures. Negative for dizziness and syncope.  Hematological: Bruises/bleeds easily.       Since on Brilinta  Psychiatric/Behavioral:       A lot of family stress    BP 135/79   Pulse 67   Resp 20   Ht 5\' 7"  (1.702 m)   Wt 170 lb (77.1 kg)   SpO2 96%   BMI 26.63 kg/m  Physical Exam  Constitutional: He is oriented to person, place, and time. He appears well-developed and well-nourished. No distress.  HENT:  Head: Normocephalic and atraumatic.  Mouth/Throat: Oropharynx is clear and moist.  Teeth in good condition  Eyes: Conjunctivae are normal. Pupils are equal, round, and reactive to light.  Neck: Normal range of motion. Neck supple. No JVD present. No thyromegaly present.  Cardiovascular: Normal rate, regular rhythm and intact distal pulses.   Murmur heard. 3/6 systolic murmur RSB, soft S2  Pulmonary/Chest: Effort normal and breath sounds normal. No respiratory distress. He has no wheezes. He has no rales.  Abdominal: Soft. Bowel sounds are normal. He exhibits no distension and no mass. There is no tenderness.  Musculoskeletal: He exhibits deformity.  Both knees  Lymphadenopathy:    He has no cervical adenopathy.  Neurological: He is alert and oriented to person, place, and time. He has normal strength. No cranial nerve deficit or sensory  deficit.  Skin: Skin is warm and dry.  Psychiatric: He has a normal mood and affect.     Diagnostic Tests:   LONELL CAMPANELLI  Cardiac catheterization  Order# [id number]  Reading physician: Corky Crafts, MD Ordering physician: Corky Crafts, MD Study date: 03/30/16  Physicians   Panel Physicians Referring Physician Case Authorizing Physician  Corky Crafts, MD (Primary)    Procedures   Coronary Stent Intervention  Right/Left Heart Cath and Coronary Angiography  Conclusion     Prox RCA to Mid RCA lesion, 90 %stenosed. A STENT SYNERGY DES [id number] drug eluting stent was successfully placed.  Post intervention, there is a 0% residual stenosis.  LV end diastolic pressure is moderately elevated.  There is moderate aortic valve stenosis. There is a large gradient across the aortic valve, but based on the echo and how easily the catheter crossed the valve, I suspect much of the gradient is subvalvular.  There is no pulmonic valve stenosis.  Cardiac index 2.6. Normal pulmonary artery pressures.  Continue dual antiplatelet therapy. He is a candidate for the TWILIGHT study.  I spoke with Dr. Mayford Knife during the case regarding the aortic stenosis. There is a large gradient present but is mostly subvalvular most likely. The pigtail catheter in JR catheter across the valve without even a wire.  He'll need diuresis as well to help with his elevated LVEDP.   Indications   Abnormal nuclear stress test [R94.39 (ICD-10-CM)]  Aortic stenosis [I35.0 (ICD-10-CM)]  Procedural Details/Technique   Technical Details The risks, benefits, and details of the procedure were explained to the patient. The patient verbalized understanding and wanted to proceed. Informed written consent was obtained.  PROCEDURE TECHNIQUE: After Xylocaine anesthesia, a 7 French sheath was placed in the right common femoral vein. A 7 [place]  balloontipped Swan-Ganz catheter was advanced to  the pulmonary artery under fluoroscopic guidance. Hemodynamic pressures were obtained. Oxygen saturations were obtained. After Xylocaine anesthesia, a 14F sheath was placed in the right radial artery with a single anterior needle wall stick. Right coronary angiography was done using a Judkins R4 guide catheter. Left coronary angiography was done using a Judkins L3.5 guide catheter. Left heart cath was done using a pigtail catheter.   IV Heparin and tirofiban were given for anticoagulation. Oral Brilinta was given at the end of the case. A JR4 guide catheter was used to engage the RCA. A pro-water wire was placed across the area disease. A 2.25 balloon was used to predilate. A 3.0 x 38 Synergy drug-eluting stent was deployed at high pressure. A 3.5 noncompliant balloon was used to post dilate. There is an excellent angiographic result. There is no residual stenosis.      Contrast: 100     Estimated blood loss <50 mL. . During this procedure the patient was administered the following to achieve and maintain moderate conscious sedation: Versed 4 mg, Fentanyl 75 mcg, while the patient's heart rate, blood pressure, and oxygen saturation were continuously monitored. The period of conscious sedation was 100 minutes, of which I was present face-to-face 100% of this time.    Complications   Complications documented before study signed (03/30/2016 10:23 AM EDT)    No complications were associated with this study.  Documented by Corky Crafts, MD - 03/30/2016 10:13 AM EDT    Coronary Findings   Dominance: Right  Left Anterior Descending  Prox LAD to Mid LAD lesion, 40% stenosed.  Second Diagonal [employer] 2nd Diag to 2nd Diag lesion, 25% stenosed.  Left Circumflex  Ost Cx to Prox Cx lesion, 25% stenosed.  Right Coronary Artery  Prox RCA to Mid RCA lesion, 90% stenosed.  Angioplasty: Pre-stent angioplasty was performed using a BALLOON EMERGE MR 2.25X20. Maximum pressure: 14 atm. A STENT  SYNERGY DES [id number] drug eluting stent was successfully placed. Stent strut is well apposed. Post-stent angioplasty was performed using a BALLOON [place] EMERGE MR 3.5X20. Maximum pressure: 18 atm. The pre-interventional distal flow is normal (TIMI 3). The post-interventional distal flow is normal (TIMI 3). The intervention was successful . No complications occurred at this lesion.  There is no residual stenosis post intervention.  Right Posterior Descending Artery  RPDA lesion, 50% stenosed.  Right Heart   Right Heart Pressures Elevated LV EDP consistent with volume overload.    Right Atrium Right atrial pressure is elevated.    Pulmonic Valve There is no pulmonic valve stenosis.    Left Heart   Left Ventricle LV end diastolic pressure is moderately elevated.    Aortic Valve There is moderate aortic valve stenosis. There is a large gradient across the aortic valve, but based on the echo and how easily the catheter crossed the valve, I suspect much of the gradient is subvalvular.    Coronary Diagrams   Diagnostic Diagram       Post-Intervention Diagram       Implants        Permanent Stent  Stent Synergy Des 3x38 - [id number] - Implanted    Inventory item: Stent Synergy Des 3x38 Model/Cat number: [id number]  Manufacturer: [employer] Lot number: [id number]  Device identifier: [id number] Device identifier type: GS1  GUDID Information   Request status Successful    Brand name: SYNERGYT Version/Model: [id number]  Company name: [employer] MRI safety info  as of 03/30/16: MR Conditional  Contains dry or latex rubber: No    GMDN P.T. name: Coronary angioplasty balloon catheter, basic    As of 03/30/2016   Status: Implanted      PACS Images   Show images for Cardiac catheterization   Link to Procedure Log   Procedure Log    Hemo Data    Most Recent Value  Fick Cardiac Output 5.17 L/min  Fick Cardiac Output Index  2.68 (L/min)/BSA  Aortic Mean Gradient 41.8 mmHg  Aortic Peak Gradient 53 mmHg  Aortic Valve Area 0.85  Aortic Value Area Index 0.44 cm2/BSA  RA A Wave 11 mmHg  RA V Wave 11 mmHg  RA Mean 9 mmHg  RV Systolic Pressure 29 mmHg  RV Diastolic Pressure 3 mmHg  RV EDP 9 mmHg  PA Systolic Pressure 30 mmHg  PA Diastolic Pressure 8 mmHg  PA Mean 17 mmHg  PW A Wave 24 mmHg  PW V Wave 21 mmHg  PW Mean 17 mmHg  AO Systolic Pressure 131 mmHg  AO Diastolic Pressure 69 mmHg  AO Mean 96 mmHg  LV Systolic Pressure 180 mmHg  LV Diastolic Pressure 19 mmHg  LV EDP 33 mmHg  Arterial Occlusion Pressure Extended Systolic Pressure 129 mmHg  Arterial Occlusion Pressure Extended Diastolic Pressure 70 mmHg  Arterial Occlusion Pressure Extended Mean Pressure 95 mmHg  Left Ventricular Apex Extended Systolic Pressure 182 mmHg  Left Ventricular Apex Extended Diastolic Pressure 11 mmHg  Left Ventricular Apex Extended EDP Pressure 27 mmHg  QP/QS 1  TPVR Index 6.34 HRUI  TSVR Index 35.81 HRUI  TPVR/TSVR Ratio 0.18      *[facility]* [address] [place], [place] [address] [phone]  ------------------------------------------------------------------- Transthoracic Echocardiography  Patient: Jeromie, Meziere MR #: [id number] Study Date: 10/21/2016 Gender: M Age: 87 Height: 166.4 cm Weight: 79.4 kg BSA: 1.94 m^2 Pt. Status: Room:  ORDERING Armanda Magic, MD REFERRING Armanda Magic, MD ATTENDING Dietrich Pates, M.D. PERFORMING [facility], Outpatient SONOGRAPHER Bethany McMahill, RDCS  cc:  ------------------------------------------------------------------- LV EF: 55% - 60%  ------------------------------------------------------------------- Indications: CAD  (I25.10).  ------------------------------------------------------------------- History: PMH: Seizures, Rheumatic Fever (as child) Murmur. Coronary artery disease. Congestive heart failure. Aortic valve disease. Risk factors: Family history of coronary artery disease.  ------------------------------------------------------------------- Study Conclusions  - Left ventricle: The cavity size was normal. Wall thickness was increased in a pattern of mild LVH. Systolic function was normal. The estimated ejection fraction was in the range of 55% to 60%. The study is not technically sufficient to allow evaluation of LV diastolic function. - Aortic valve: AV is thickened, calcified with restricted motion Peak and mean gradients through the valve are 57 to 37 mm Hg respectively consistent with moderate to severe AS. There is trivial AI. SInce echo report of August 2017 this is very mildly increased. - Mitral valve: There was mild regurgitation. - Left atrium: The atrium was mildly dilated.  ------------------------------------------------------------------- Study data: Comparison was made to the study of 03/19/2016. Study status: Routine. Procedure: Transthoracic echocardiography. Image quality was adequate. Transthoracic echocardiography. M-mode, complete 2D, 3D, spectral Doppler, and color Doppler. Birthdate: Patient birthdate: [birth year]. Age: Patient is 71 yr old. Sex: Gender: male. BMI: 28.7 kg/m^2. Blood pressure: 138/78 Patient status: Outpatient. Study date: Study date: 10/21/2016. Study time: 09:21 AM. Location: [facility]  -------------------------------------------------------------------  ------------------------------------------------------------------- Left ventricle: The cavity size was normal. Wall thickness was increased in a pattern of mild LVH. Systolic function was normal. The estimated ejection  fraction  was in the range of 55% to 60%. The study is not technically sufficient to allow evaluation of LV diastolic function.  ------------------------------------------------------------------- Aortic valve: AV is thickened, calcified with restricted motion Peak and mean gradients through the valve are 57 to 37 mm Hg respectively consistent with moderate to severe AS. There is trivial AI. SInce echo report of August 2017 this is very mildly increased. Doppler: VTI ratio of LVOT to aortic valve: 0.26. Valve area (VTI): 1.7 cm^2. Indexed valve area (VTI): 0.88 cm^2/m^2. Peak velocity ratio of LVOT to aortic valve: 0.25. Valve area (Vmax): 1.65 cm^2. Indexed valve area (Vmax): 0.85 cm^2/m^2. Mean velocity ratio of LVOT to aortic valve: 0.22. Valve area (Vmean): 1.44 cm^2. Indexed valve area (Vmean): 0.74 cm^2/m^2. Mean gradient (S): 34 mm Hg. Peak gradient (S): 55 mm Hg.  ------------------------------------------------------------------- Mitral valve: Mildly thickened leaflets . Leaflet separation was normal. Doppler: Transvalvular velocity was within the normal range. There was no evidence for stenosis. There was mild regurgitation.  ------------------------------------------------------------------- Left atrium: The atrium was mildly dilated.  ------------------------------------------------------------------- Right ventricle: The cavity size was normal. Wall thickness was normal. Systolic function was normal.  ------------------------------------------------------------------- Pulmonic valve: Structurally normal valve. Cusp separation was normal. Doppler: Transvalvular velocity was within the normal range. There was no regurgitation.  ------------------------------------------------------------------- Tricuspid valve: Structurally normal valve. Leaflet separation was normal. Doppler: Transvalvular velocity was within the normal range. There was  trivial regurgitation.  ------------------------------------------------------------------- Right atrium: The atrium was normal in size.  ------------------------------------------------------------------- Pericardium: There was no pericardial effusion.  ------------------------------------------------------------------- Systemic veins: Inferior vena cava: The vessel was normal in size. The respirophasic diameter changes were in the normal range (= 50%), consistent with normal central venous pressure.  ------------------------------------------------------------------- Measurements  Left ventricle Value Reference LV ID, ED, PLAX chordal 47.4 mm 43 - 52 LV ID, ES, PLAX chordal 28.6 mm 23 - 38 LV fx shortening, PLAX chordal 40 % >=29 LV PW thickness, ED 13 mm ---------- IVS/LV PW ratio, ED 1.05 <=1.3 Stroke volume, 2D 169 ml ---------- Stroke volume/bsa, 2D 87 ml/m^2 ---------- LV e&', lateral 7.51 cm/s ---------- LV E/e&', lateral 7.5 ---------- LV e&', medial 4.46 cm/s ---------- LV E/e&', medial 12.62 ---------- LV e&', average 5.99 cm/s ---------- LV E/e&', average 9.41 ----------  Ventricular septum Value Reference IVS thickness, ED 13.7 mm ----------  LVOT Value Reference LVOT ID, S 29 mm ---------- LVOT area 6.61 cm^2 ---------- LVOT peak velocity, S  92.4 cm/s ---------- LVOT mean velocity, S 59.5 cm/s ---------- LVOT VTI, S 25.5 cm ----------  Aortic valve Value Reference Aortic valve peak velocity, S 370 cm/s ---------- Aortic valve mean velocity, S 273 cm/s ---------- Aortic valve VTI, S 99.4 cm ---------- Aortic mean gradient, S 34 mm Hg ---------- Aortic peak gradient, S 55 mm Hg ---------- VTI ratio, LVOT/AV 0.26 ---------- Aortic valve area, VTI 1.7 cm^2 ---------- Aortic valve area/bsa, VTI 0.88 cm^2/m^2 ---------- Velocity ratio, peak, LVOT/AV 0.25 ---------- Aortic valve area, peak velocity 1.65 cm^2 ---------- Aortic valve area/bsa, peak 0.85 cm^2/m^2 ---------- velocity Velocity ratio, mean, LVOT/AV 0.22 ---------- Aortic valve area, mean velocity 1.44 cm^2 ---------- Aortic valve area/bsa, mean 0.74 cm^2/m^2 ---------- velocity Aortic regurg pressure half-time 719 ms ----------  Aorta Value Reference Aortic root ID, ED 40 mm ---------- Ascending aorta ID, A-P, S 34 mm ----------  Left atrium Value Reference LA ID, A-P, ES 46 mm ---------- LA ID/bsa, A-P (H) 2.37 cm/m^2 <=2.2 LA volume, S 55.8 ml ---------- LA volume/bsa, S 28.8 ml/m^2 ---------- LA volume, ES, 1-p A4C  32.1 ml ---------- LA volume/bsa,  ES, 1-p A4C 16.6 ml/m^2 ---------- LA volume, ES, 1-p A2C 86.8 ml ---------- LA volume/bsa, ES, 1-p A2C 44.8 ml/m^2 ----------  Mitral valve Value Reference Mitral E-wave peak velocity 56.3 cm/s ---------- Mitral A-wave peak velocity 54.3 cm/s ---------- Mitral deceleration time (H) 338 ms 150 - 230 Mitral E/A ratio, peak 1 ----------  Right atrium Value Reference RA ID, S-I, ES, A4C (H) 50.8 mm 34 - 49 RA area, ES, A4C 17.4 cm^2 8.3 - 19.5 RA volume, ES, A/L 50.1 ml ---------- RA volume/bsa, ES, A/L 25.9 ml/m^2 ----------  Right ventricle Value Reference TAPSE 17.8 mm ---------- RV s&', lateral, S 15.8 cm/s ----------  Legend: (L) and (H) mark values outside specified reference range.  ------------------------------------------------------------------- Prepared and Electronically Authenticated by  Dietrich Pates, M.D. 2018-03-08T21:29:19    *[place]* *[facility]* [address] [place], [place] [address] [phone]  ------------------------------------------------------------------- Transesophageal Echocardiography  Patient: Demon, Hotop MR #: [id number] Study Date: 11/01/2016 Gender: M Age: 41 Height: 166.4 cm Weight: 76.7 kg BSA: 1.9 m^2 Pt. Status: Room:  SONOGRAPHER Perley Jain, RDCS ORDERING Thurmon Fair, MD PERFORMING Thurmon Fair,  MD  cc:  ------------------------------------------------------------------- LV EF: 55% - 60%  ------------------------------------------------------------------- Indications: Aortic stenosis 424.1.  ------------------------------------------------------------------- Study Conclusions  - Left ventricle: The cavity size was normal. There was mild concentric hypertrophy. Systolic function was normal. The estimated ejection fraction was in the range of 55% to 60%. Wall motion was normal; there were no regional wall motion abnormalities. Doppler parameters are consistent with abnormal left ventricular relaxation (grade 1 diastolic dysfunction). - Aortic valve: Valve mobility was severely restricted. There was severe stenosis. There was mild regurgitation. Valve area (VTI): 0.67 cm^2. Valve area (Vmax): 0.67 cm^2. Valve area (Vmean): 0.69 cm^2. - Mitral valve: No evidence of vegetation. - Left atrium: No evidence of thrombus in the atrial cavity or appendage. No spontaneous echo contrast was observed. - Right atrium: No evidence of thrombus in the atrial cavity or appendage. - Tricuspid valve: No evidence of vegetation. - Pulmonic valve: No evidence of vegetation.  ------------------------------------------------------------------- Study data: Study status: Routine. Consent: The risks, benefits, and alternatives to the procedure were explained to the patient and informed consent was obtained. Procedure: The patient reported no pain pre or post test. Initial setup. The patient was brought to the laboratory. Surface ECG leads were monitored. Sedation. Conscious sedation was administered by cardiology staff. Transesophageal echocardiography. An adult multiplane transesophageal probe was inserted by the attending cardiologistwithout difficulty. Image quality was adequate. Study completion: The patient tolerated the procedure well. There  were no complications. Administered medications: Fentanyl, , IV. Midazolam, 8mg , IV. Diagnostic transesophageal echocardiography. 2D and color Doppler. Birthdate: Patient birthdate: 09-30-1945. Age: Patient is 71 yr old. Sex: Gender: male. BMI: 27.7 kg/m^2. Blood pressure: 154/76 Patient status: Outpatient. Study date: Study date: 11/01/2016. Study time: 11:42 AM. Location: Endoscopy.  -------------------------------------------------------------------  ------------------------------------------------------------------- Left ventricle: The cavity size was normal. There was mild concentric hypertrophy. Systolic function was normal. The estimated ejection fraction was in the range of 55% to 60%. Wall motion was normal; there were no regional wall motion abnormalities. Doppler parameters are consistent with abnormal left ventricular relaxation (grade 1 diastolic dysfunction).  ------------------------------------------------------------------- Aortic valve: Probably trileaflet; severely thickened, severely calcified leaflets. Valve mobility was severely restricted. Doppler: There was severe stenosis. There was mild regurgitation. VTI ratio of LVOT to aortic valve: 0.2. Valve area (VTI): 0.67 cm^2. Indexed valve area (VTI): 0.35 cm^2/m^2. Peak velocity ratio of  LVOT to aortic valve: 0.2. Valve area (Vmax): 0.67 cm^2. Indexed valve area (Vmax): 0.35 cm^2/m^2. Mean velocity ratio of LVOT to aortic valve: 0.21. Valve area (Vmean): 0.69 cm^2. Indexed valve area (Vmean): 0.36 cm^2/m^2. Mean gradient (S): 53 mm Hg. Peak gradient (S): 92 mm Hg.  ------------------------------------------------------------------- Aorta: The aorta was not dilated and trivially diseased.  ------------------------------------------------------------------- Mitral valve: Structurally normal valve. Leaflet separation was normal. No evidence of  vegetation. Doppler: There was no regurgitation.  ------------------------------------------------------------------- Left atrium: The atrium was normal in size. No evidence of thrombus in the atrial cavity or appendage. No spontaneous echo contrast was observed. Emptying velocity was normal.  ------------------------------------------------------------------- Right ventricle: The cavity size was normal. Systolic function was normal.  ------------------------------------------------------------------- Pulmonic valve: Structurally normal valve. Cusp separation was normal. No evidence of vegetation.  ------------------------------------------------------------------- Tricuspid valve: Structurally normal valve. Leaflet separation was normal. No evidence of vegetation. Doppler: There was no regurgitation.  ------------------------------------------------------------------- Right atrium: The atrium was normal in size. No evidence of thrombus in the atrial cavity or appendage.  ------------------------------------------------------------------- Pericardium: There was no pericardial effusion.  ------------------------------------------------------------------- Post procedure conclusions Ascending Aorta:  - The aorta was not dilated and trivially diseased.  ------------------------------------------------------------------- Measurements  Left ventricle Value Stroke volume, 2D 71 ml Stroke volume/bsa, 2D 37 ml/m^2  LVOT Value LVOT ID, S 20.6 mm LVOT area 3.33 cm^2 LVOT peak velocity, S 96.8 cm/s LVOT mean velocity, S 69.3 cm/s LVOT VTI, S 24.9 cm LVOT peak gradient, S 4 mm  Hg Stroke volume (SV), LVOT DP 83 ml Stroke index (SV/bsa), LVOT DP 43.7 ml/m^2  Aortic valve Value Aortic valve peak velocity, S 480 cm/s Aortic valve mean velocity, S 337 cm/s Aortic valve VTI, S 123 cm Aortic mean gradient, S 53 mm Hg Aortic peak gradient, S 92 mm Hg VTI ratio, LVOT/AV 0.2 Aortic valve area, VTI 0.67 cm^2 Aortic valve area/bsa, VTI 0.35 cm^2/m^2 Velocity ratio, peak, LVOT/AV 0.2 Aortic valve area, peak velocity 0.67 cm^2 Aortic valve area/bsa, peak velocity 0.35 cm^2/m^2 Velocity ratio, mean, LVOT/AV 0.21 Aortic valve area, mean velocity 0.69 cm^2 Aortic valve area/bsa, mean velocity 0.36 cm^2/m^2  Legend: (L) and (H) mark values outside specified reference range.  ------------------------------------------------------------------- Prepared and Electronically Authenticated by  Thurmon Fair, MD 2018-03-19T17:26:24   RISK SCORES About the STS Risk Calculator Procedure: AV Replacement + CAB  Risk of Mortality: 2.06%  Morbidity or Mortality: 14.811%  Long Length of Stay: 6.673%  Short Length of Stay: 39.421%  Permanent Stroke: 1.642%  Prolonged Ventilation: 8.944%  DSW Infection: 0.295%  Renal Failure: 3.223%  Reoperation: 8.012%     Impression:  This 71 year old gentleman has stage D, severe, symptomatic aortic stenosis with some fatigue and shortness of breath with exertion but he is able to do his normal daily activities without difficulty. He is probably NYHA functional class 1-2. I have personally reviewed his 2D echos, TEE and cath. 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 The mean AV gradient by TEE is 53 mm  Hg consistent with severe AS and there is no evidence of subvalvular stenosis. 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 It has been 12 months since his PCI and hopefully the RCA is still widely patent since it was a good result and he has not had return of significant chest discomfort. His STS PROM is only 2.1% for AVR and CABG  but I think he would have a difficult time recovering from that due to his severe bilateral knee DJD that limits his ambulation and will require a knee replacement once his aortic stenosis is treated. I think he is probably intermediate risk for open surgical treatment of his AS due to his limited mobility. Given his age of 59 and since his annular size would allow a 29 mm Sapien 3 valve I think TAVR would be the best treatment for him. His cardiac CT shows anatomy favorable for a 29 mm Sapien 3 valve. His abdominal and pelvic CT shows pelvic arterial anatomy suitable for transfemoral insertion. There is an avidly enhancing right renal mass which in retrospect was probably there on the CT in 12/2011 and therefore most likely benign. A follow up scan in 6 months with IV contrast was recommended.  I reviewed the CT images with him and his wife and answered their questions. I will discuss with interventional cardiology doing a right coronary artery angiogram at the time of the procedure to assess the RCA stent.   The patient and his wife were counseled at length regarding treatment alternatives for management of severe symptomatic aortic stenosis. The risks and benefits of surgical intervention has been discussed in detail. Long-term prognosis with medical therapy was discussed. Alternative approaches such as conventional surgical aortic valve replacement, transcatheter aortic valve replacement, and palliative medical therapy were compared and contrasted at length. This discussion was placed in the  context of the patient's own specific clinical presentation and past medical history. All of their questions been addressed.   The patient has been advised of a variety of complications that might develop including but not limited to risks of death, stroke, paravalvular leak, aortic dissection or other major vascular complications, aortic annulus rupture, device embolization, cardiac rupture or perforation, mitral regurgitation, acute myocardial infarction, arrhythmia, heart block or bradycardia requiring permanent pacemaker placement, congestive heart failure, respiratory failure, renal failure, pneumonia, infection, other late complications related to structural valve deterioration or migration, or other complications that might ultimately cause a temporary or permanent loss of functional independence or other long term morbidity. The patient provides full informed consent for the procedure as described and all questions were answered.     Plan:  Transfemoral TAVR and possible right coronary artery angiogram.     Alleen Borne, MD [facility] [phone]

## 2017-03-22 ENCOUNTER — Inpatient Hospital Stay (HOSPITAL_COMMUNITY): Payer: Medicare Other | Admitting: Anesthesiology

## 2017-03-22 ENCOUNTER — Encounter (HOSPITAL_COMMUNITY): Payer: Self-pay | Admitting: Urology

## 2017-03-22 ENCOUNTER — Encounter (HOSPITAL_COMMUNITY)
Admission: RE | Disposition: A | Discharge status: Home / Self Care | Payer: Self-pay | Source: Ambulatory Visit | Attending: Cardiovascular Disease

## 2017-03-22 ENCOUNTER — Inpatient Hospital Stay (HOSPITAL_COMMUNITY): Payer: Medicare Other

## 2017-03-22 ENCOUNTER — Inpatient Hospital Stay (HOSPITAL_COMMUNITY)
Admission: RE | Admit: 2017-03-22 | Discharge: 2017-03-23 | DRG: 267 | Disposition: A | Discharge status: Home / Self Care | Payer: Medicare Other | Source: Ambulatory Visit | Attending: Cardiovascular Disease | Admitting: Cardiovascular Disease

## 2017-03-22 ENCOUNTER — Other Ambulatory Visit: Payer: Self-pay

## 2017-03-22 DIAGNOSIS — I350 Nonrheumatic aortic (valve) stenosis: Secondary | ICD-10-CM

## 2017-03-22 DIAGNOSIS — R011 Cardiac murmur, unspecified: Secondary | ICD-10-CM | POA: Diagnosis present

## 2017-03-22 DIAGNOSIS — Z9049 Acquired absence of other specified parts of digestive tract: Secondary | ICD-10-CM

## 2017-03-22 DIAGNOSIS — Z8249 Family history of ischemic heart disease and other diseases of the circulatory system: Secondary | ICD-10-CM

## 2017-03-22 DIAGNOSIS — Z7982 Long term (current) use of aspirin: Secondary | ICD-10-CM | POA: Diagnosis not present

## 2017-03-22 DIAGNOSIS — Z955 Presence of coronary angioplasty implant and graft: Secondary | ICD-10-CM

## 2017-03-22 DIAGNOSIS — I25119 Atherosclerotic heart disease of native coronary artery with unspecified angina pectoris: Secondary | ICD-10-CM | POA: Diagnosis present

## 2017-03-22 DIAGNOSIS — I2510 Atherosclerotic heart disease of native coronary artery without angina pectoris: Secondary | ICD-10-CM

## 2017-03-22 DIAGNOSIS — Z818 Family history of other mental and behavioral disorders: Secondary | ICD-10-CM

## 2017-03-22 DIAGNOSIS — Z79899 Other long term (current) drug therapy: Secondary | ICD-10-CM

## 2017-03-22 DIAGNOSIS — I00 Rheumatic fever without heart involvement: Secondary | ICD-10-CM | POA: Diagnosis present

## 2017-03-22 DIAGNOSIS — R9431 Abnormal electrocardiogram [ECG] [EKG]: Secondary | ICD-10-CM | POA: Diagnosis present

## 2017-03-22 DIAGNOSIS — E785 Hyperlipidemia, unspecified: Secondary | ICD-10-CM | POA: Diagnosis present

## 2017-03-22 DIAGNOSIS — I517 Cardiomegaly: Secondary | ICD-10-CM | POA: Diagnosis present

## 2017-03-22 DIAGNOSIS — Z9889 Other specified postprocedural states: Secondary | ICD-10-CM

## 2017-03-22 DIAGNOSIS — G4730 Sleep apnea, unspecified: Secondary | ICD-10-CM | POA: Diagnosis present

## 2017-03-22 DIAGNOSIS — M170 Bilateral primary osteoarthritis of knee: Secondary | ICD-10-CM | POA: Diagnosis present

## 2017-03-22 DIAGNOSIS — Z841 Family history of disorders of kidney and ureter: Secondary | ICD-10-CM | POA: Diagnosis not present

## 2017-03-22 DIAGNOSIS — R001 Bradycardia, unspecified: Secondary | ICD-10-CM | POA: Diagnosis present

## 2017-03-22 DIAGNOSIS — R9439 Abnormal result of other cardiovascular function study: Secondary | ICD-10-CM | POA: Diagnosis present

## 2017-03-22 DIAGNOSIS — Z952 Presence of prosthetic heart valve: Secondary | ICD-10-CM | POA: Diagnosis not present

## 2017-03-22 DIAGNOSIS — Z954 Presence of other heart-valve replacement: Secondary | ICD-10-CM | POA: Diagnosis not present

## 2017-03-22 DIAGNOSIS — Z006 Encounter for examination for normal comparison and control in clinical research program: Secondary | ICD-10-CM

## 2017-03-22 DIAGNOSIS — Z981 Arthrodesis status: Secondary | ICD-10-CM

## 2017-03-22 DIAGNOSIS — I5032 Chronic diastolic (congestive) heart failure: Secondary | ICD-10-CM | POA: Diagnosis present

## 2017-03-22 HISTORY — PX: TEE WITHOUT CARDIOVERSION: SHX5443

## 2017-03-22 HISTORY — PX: TRANSCATHETER AORTIC VALVE REPLACEMENT, TRANSFEMORAL: SHX6400

## 2017-03-22 LAB — POCT I-STAT 4, (NA,K, GLUC, HGB,HCT)
GLUCOSE: 115 mg/dL — AB (ref 65–99)
HCT: 40 % (ref 39.0–52.0)
HEMOGLOBIN: 13.6 g/dL (ref 13.0–17.0)
POTASSIUM: 4.5 mmol/L (ref 3.5–5.1)
Sodium: 138 mmol/L (ref 135–145)

## 2017-03-22 LAB — POCT I-STAT 3, ART BLOOD GAS (G3+)
ACID-BASE DEFICIT: 4 mmol/L — AB (ref 0.0–2.0)
Acid-Base Excess: 1 mmol/L (ref 0.0–2.0)
Acid-base deficit: 3 mmol/L — ABNORMAL HIGH (ref 0.0–2.0)
Bicarbonate: 25.8 mmol/L (ref 20.0–28.0)
Bicarbonate: 25.5 mmol/L (ref 20.0–28.0)
Bicarbonate: 27.3 mmol/L (ref 20.0–28.0)
O2 SAT: 96 %
O2 Saturation: 97 %
O2 Saturation: 97 %
PCO2 ART: 65.5 mmHg — AB (ref 32.0–48.0)
PCO2 ART: 66.2 mmHg — AB (ref 32.0–48.0)
PH ART: 7.204 — AB (ref 7.350–7.450)
PH ART: 7.194 — AB (ref 7.350–7.450)
PH ART: 7.342 — AB (ref 7.350–7.450)
TCO2: 28 mmol/L (ref 0–100)
TCO2: 28 mmol/L (ref 0–100)
TCO2: 29 mmol/L (ref 0–100)
pCO2 arterial: 50.3 mmHg — ABNORMAL HIGH (ref 32.0–48.0)
pO2, Arterial: 110 mmHg — ABNORMAL HIGH (ref 83.0–108.0)
pO2, Arterial: 106 mmHg (ref 83.0–108.0)
pO2, Arterial: 103 mmHg (ref 83.0–108.0)

## 2017-03-22 LAB — ECHOCARDIOGRAM LIMITED
AO mean calculated velocity dopler: 172 cm/s
AOVTI: 69.9 cm
AV Area VTI: 1.37 cm2
AV Mean grad: 14 mmHg
AV Peak grad: 26 mmHg
AV VEL mean LVOT/AV: 0.39
AV area mean vel ind: 0.65 cm2/m2
AVAREAMEANV: 1.23 cm2
AVAREAVTIIND: 0.73 cm2/m2
AVPKVEL: 253 cm/s
Ao pk vel: 0.43 m/s
CHL CUP AV PEAK INDEX: 0.72
CHL CUP AV VEL: 1.38
CHL CUP DOP CALC LVOT VTI: 30.8 cm
LDCA: 3.14 cm2
LVOT SV: 97 mL
LVOT peak VTI: 0.44 cm
LVOTD: 20 mm
LVOTPV: 110 cm/s
Valve area index: 0.73
Valve area: 1.38 cm2

## 2017-03-22 LAB — POCT I-STAT, CHEM 8
BUN: 17 mg/dL (ref 6–20)
BUN: 16 mg/dL (ref 6–20)
BUN: 16 mg/dL (ref 6–20)
CALCIUM ION: 1.2 mmol/L (ref 1.15–1.40)
CALCIUM ION: 1.23 mmol/L (ref 1.15–1.40)
CHLORIDE: 101 mmol/L (ref 101–111)
Calcium, Ion: 1.21 mmol/L (ref 1.15–1.40)
Chloride: 103 mmol/L (ref 101–111)
Chloride: 103 mmol/L (ref 101–111)
Creatinine, Ser: 0.8 mg/dL (ref 0.61–1.24)
Creatinine, Ser: 0.7 mg/dL (ref 0.61–1.24)
Creatinine, Ser: 0.7 mg/dL (ref 0.61–1.24)
GLUCOSE: 106 mg/dL — AB (ref 65–99)
Glucose, Bld: 132 mg/dL — ABNORMAL HIGH (ref 65–99)
Glucose, Bld: 130 mg/dL — ABNORMAL HIGH (ref 65–99)
HCT: 37 % — ABNORMAL LOW (ref 39.0–52.0)
HCT: 36 % — ABNORMAL LOW (ref 39.0–52.0)
HEMATOCRIT: 37 % — AB (ref 39.0–52.0)
HEMOGLOBIN: 12.6 g/dL — AB (ref 13.0–17.0)
Hemoglobin: 12.2 g/dL — ABNORMAL LOW (ref 13.0–17.0)
Hemoglobin: 12.6 g/dL — ABNORMAL LOW (ref 13.0–17.0)
POTASSIUM: 4.3 mmol/L (ref 3.5–5.1)
POTASSIUM: 4.5 mmol/L (ref 3.5–5.1)
Potassium: 4.4 mmol/L (ref 3.5–5.1)
SODIUM: 139 mmol/L (ref 135–145)
SODIUM: 140 mmol/L (ref 135–145)
SODIUM: 136 mmol/L (ref 135–145)
TCO2: 24 mmol/L (ref 0–100)
TCO2: 26 mmol/L (ref 0–100)
TCO2: 26 mmol/L (ref 0–100)

## 2017-03-22 LAB — APTT: APTT: 35 s (ref 24–36)

## 2017-03-22 LAB — CBC
HCT: 42 % (ref 39.0–52.0)
Hemoglobin: 13.6 g/dL (ref 13.0–17.0)
MCH: 29.1 pg (ref 26.0–34.0)
MCHC: 32.4 g/dL (ref 30.0–36.0)
MCV: 89.7 fL (ref 78.0–100.0)
PLATELETS: 151 10*3/uL (ref 150–400)
RBC: 4.68 MIL/uL (ref 4.22–5.81)
RDW: 14.3 % (ref 11.5–15.5)
WBC: 6.8 10*3/uL (ref 4.0–10.5)

## 2017-03-22 LAB — PROTIME-INR
INR: 1.1
PROTHROMBIN TIME: 14.3 s (ref 11.4–15.2)

## 2017-03-22 SURGERY — IMPLANTATION, AORTIC VALVE, TRANSCATHETER, FEMORAL APPROACH
Anesthesia: Monitor Anesthesia Care | Site: Chest

## 2017-03-22 MED ORDER — ASPIRIN 81 MG PO CHEW
81.0000 mg | CHEWABLE_TABLET | Freq: Every day | ORAL | Status: DC
  Administered 2017-03-22 – 2017-03-23 (×2): 81 mg via ORAL
  Filled 2017-03-22 (×2): qty 1

## 2017-03-22 MED ORDER — MORPHINE SULFATE (PF) 4 MG/ML IV SOLN: 2.0000 mg | INTRAVENOUS | Status: DC | PRN

## 2017-03-22 MED ORDER — HYDROCORTISONE 2.5 % RE CREA: 1.0000 "application " | TOPICAL_CREAM | Freq: Every day | RECTAL | Status: DC | PRN

## 2017-03-22 MED ORDER — SODIUM CHLORIDE 0.9 % IV SOLN
INTRAVENOUS | Status: DC | PRN
  Administered 2017-03-22 (×3): 500 mL

## 2017-03-22 MED ORDER — METOPROLOL TARTRATE 5 MG/5ML IV SOLN: 2.5000 mg | INTRAVENOUS | Status: DC | PRN

## 2017-03-22 MED ORDER — PROPOFOL 10 MG/ML IV BOLUS
INTRAVENOUS | Status: AC
  Filled 2017-03-22: qty 20

## 2017-03-22 MED ORDER — CHLORHEXIDINE GLUCONATE 4 % EX LIQD: 60.0000 mL | Freq: Once | CUTANEOUS | Status: DC

## 2017-03-22 MED ORDER — ORAL CARE MOUTH RINSE
15.0000 mL | Freq: Two times a day (BID) | OROMUCOSAL | Status: DC
  Administered 2017-03-22: 15 mL via OROMUCOSAL

## 2017-03-22 MED ORDER — DEXAMETHASONE SODIUM PHOSPHATE 10 MG/ML IJ SOLN
INTRAMUSCULAR | Status: DC | PRN
  Administered 2017-03-22: 5 mg via INTRAVENOUS

## 2017-03-22 MED ORDER — DIPHENHYDRAMINE HCL 50 MG/ML IJ SOLN
INTRAMUSCULAR | Status: AC
  Filled 2017-03-22: qty 1

## 2017-03-22 MED ORDER — ALBUMIN HUMAN 5 % IV SOLN: 250.0000 mL | INTRAVENOUS | Status: AC | PRN

## 2017-03-22 MED ORDER — LACTATED RINGERS IV SOLN
INTRAVENOUS | Status: DC | PRN
  Administered 2017-03-22 (×3): via INTRAVENOUS

## 2017-03-22 MED ORDER — SODIUM CHLORIDE 0.9 % IV SOLN
0.0000 ug/min | INTRAVENOUS | Status: DC
  Filled 2017-03-22: qty 2

## 2017-03-22 MED ORDER — PROTAMINE SULFATE 10 MG/ML IV SOLN
INTRAVENOUS | Status: DC | PRN
  Administered 2017-03-22: 20 mg via INTRAVENOUS
  Administered 2017-03-22: 10 mg via INTRAVENOUS
  Administered 2017-03-22 (×2): 25 mg via INTRAVENOUS
  Administered 2017-03-22: 20 mg via INTRAVENOUS

## 2017-03-22 MED ORDER — LIDOCAINE HCL (PF) 1 % IJ SOLN
INTRAMUSCULAR | Status: AC
  Filled 2017-03-22: qty 30

## 2017-03-22 MED ORDER — MIDAZOLAM HCL 2 MG/2ML IJ SOLN: 2.0000 mg | INTRAMUSCULAR | Status: DC | PRN

## 2017-03-22 MED ORDER — MIDAZOLAM HCL 5 MG/5ML IJ SOLN
INTRAMUSCULAR | Status: DC | PRN
  Administered 2017-03-22: 2 mg via INTRAVENOUS

## 2017-03-22 MED ORDER — PROTAMINE SULFATE 10 MG/ML IV SOLN
INTRAVENOUS | Status: AC
  Filled 2017-03-22: qty 10

## 2017-03-22 MED ORDER — CLOPIDOGREL BISULFATE 75 MG PO TABS
75.0000 mg | ORAL_TABLET | Freq: Every day | ORAL | Status: DC
  Administered 2017-03-23: 75 mg via ORAL
  Filled 2017-03-22: qty 1

## 2017-03-22 MED ORDER — HEPARIN SODIUM (PORCINE) 1000 UNIT/ML IJ SOLN
INTRAMUSCULAR | Status: DC | PRN
  Administered 2017-03-22: 10000 [IU] via INTRAVENOUS

## 2017-03-22 MED ORDER — DEXAMETHASONE SODIUM PHOSPHATE 10 MG/ML IJ SOLN
INTRAMUSCULAR | Status: AC
  Filled 2017-03-22: qty 1

## 2017-03-22 MED ORDER — LEVETIRACETAM 250 MG PO TABS
750.0000 mg | ORAL_TABLET | Freq: Two times a day (BID) | ORAL | Status: DC
  Administered 2017-03-22 – 2017-03-23 (×2): 750 mg via ORAL
  Filled 2017-03-22 (×2): qty 3

## 2017-03-22 MED ORDER — PHENYLEPHRINE HCL 10 MG/ML IJ SOLN
INTRAMUSCULAR | Status: DC | PRN
  Administered 2017-03-22: 25 ug/min via INTRAVENOUS

## 2017-03-22 MED ORDER — LIDOCAINE 1 % OPTIME INJ - NO CHARGE
INTRAMUSCULAR | Status: DC | PRN
  Administered 2017-03-22: 30 mL

## 2017-03-22 MED ORDER — OXYCODONE HCL 5 MG PO TABS: 5.0000 mg | ORAL_TABLET | ORAL | Status: DC | PRN

## 2017-03-22 MED ORDER — TRAMADOL HCL 50 MG PO TABS: 50.0000 mg | ORAL_TABLET | ORAL | Status: DC | PRN

## 2017-03-22 MED ORDER — SODIUM CHLORIDE 0.9% FLUSH: 3.0000 mL | INTRAVENOUS | Status: DC | PRN

## 2017-03-22 MED ORDER — ROCURONIUM BROMIDE 10 MG/ML (PF) SYRINGE
PREFILLED_SYRINGE | INTRAVENOUS | Status: AC
  Filled 2017-03-22: qty 5

## 2017-03-22 MED ORDER — MIDAZOLAM HCL 2 MG/2ML IJ SOLN
INTRAMUSCULAR | Status: AC
  Filled 2017-03-22: qty 2

## 2017-03-22 MED ORDER — SODIUM CHLORIDE 0.9% FLUSH
3.0000 mL | Freq: Two times a day (BID) | INTRAVENOUS | Status: DC
  Administered 2017-03-22: 3 mL via INTRAVENOUS

## 2017-03-22 MED ORDER — CHLORHEXIDINE GLUCONATE 4 % EX LIQD: 30.0000 mL | CUTANEOUS | Status: DC

## 2017-03-22 MED ORDER — SODIUM CHLORIDE 0.9 % IV SOLN: 250.0000 mL | INTRAVENOUS | Status: DC | PRN

## 2017-03-22 MED ORDER — IODIXANOL 320 MG/ML IV SOLN
INTRAVENOUS | Status: DC | PRN
  Administered 2017-03-22: 150 mL via INTRAVENOUS

## 2017-03-22 MED ORDER — 0.9 % SODIUM CHLORIDE (POUR BTL) OPTIME
TOPICAL | Status: DC | PRN
  Administered 2017-03-22: 1000 mL

## 2017-03-22 MED ORDER — VANCOMYCIN HCL IN DEXTROSE 1-5 GM/200ML-% IV SOLN
1000.0000 mg | Freq: Once | INTRAVENOUS | Status: AC
  Administered 2017-03-22: 1000 mg via INTRAVENOUS
  Filled 2017-03-22 (×2): qty 200

## 2017-03-22 MED ORDER — HEPARIN SODIUM (PORCINE) 1000 UNIT/ML IJ SOLN
INTRAMUSCULAR | Status: AC
  Filled 2017-03-22: qty 1

## 2017-03-22 MED ORDER — DEXTROSE 5 % IV SOLN
1.5000 g | Freq: Two times a day (BID) | INTRAVENOUS | Status: DC
  Administered 2017-03-22 – 2017-03-23 (×2): 1.5 g via INTRAVENOUS
  Filled 2017-03-22 (×3): qty 1.5

## 2017-03-22 MED ORDER — ONDANSETRON HCL 4 MG/2ML IJ SOLN: 4.0000 mg | Freq: Four times a day (QID) | INTRAMUSCULAR | Status: DC | PRN

## 2017-03-22 MED ORDER — NITROGLYCERIN IN D5W 200-5 MCG/ML-% IV SOLN: 0.0000 ug/min | INTRAVENOUS | Status: DC

## 2017-03-22 MED ORDER — LACTATED RINGERS IV SOLN: 500.0000 mL | Freq: Once | INTRAVENOUS | Status: DC | PRN

## 2017-03-22 MED ORDER — METOPROLOL TARTRATE 25 MG/10 ML ORAL SUSPENSION: 12.5000 mg | Freq: Two times a day (BID) | ORAL | Status: DC

## 2017-03-22 MED ORDER — SODIUM CHLORIDE 0.9 % IV SOLN
1.0000 mL/kg/h | INTRAVENOUS | Status: AC
  Administered 2017-03-22: 1 mL/kg/h via INTRAVENOUS

## 2017-03-22 MED ORDER — LIDOCAINE 2% (20 MG/ML) 5 ML SYRINGE
INTRAMUSCULAR | Status: AC
  Filled 2017-03-22: qty 5

## 2017-03-22 MED ORDER — METOPROLOL TARTRATE 12.5 MG HALF TABLET: 12.5000 mg | ORAL_TABLET | Freq: Two times a day (BID) | ORAL | Status: DC

## 2017-03-22 MED ORDER — DIPHENHYDRAMINE HCL 25 MG PO CAPS: 25.0000 mg | ORAL_CAPSULE | Freq: Every day | ORAL | Status: DC | PRN

## 2017-03-22 MED ORDER — DIPHENHYDRAMINE HCL 50 MG/ML IJ SOLN
INTRAMUSCULAR | Status: DC | PRN
Start: 2017-03-22 — End: 2017-03-22
  Administered 2017-03-22: 12.5 mg via INTRAVENOUS

## 2017-03-22 MED ORDER — ROSUVASTATIN CALCIUM 20 MG PO TABS
20.0000 mg | ORAL_TABLET | Freq: Every day | ORAL | Status: DC
  Administered 2017-03-22: 20 mg via ORAL
  Filled 2017-03-22: qty 1

## 2017-03-22 MED ORDER — PROPOFOL 500 MG/50ML IV EMUL
INTRAVENOUS | Status: DC | PRN
  Administered 2017-03-22: 10 ug/kg/min via INTRAVENOUS

## 2017-03-22 MED ORDER — PANTOPRAZOLE SODIUM 40 MG PO TBEC: 40.0000 mg | DELAYED_RELEASE_TABLET | Freq: Every day | ORAL | Status: DC

## 2017-03-22 MED ORDER — CHLORHEXIDINE GLUCONATE 0.12 % MT SOLN: 15.0000 mL | OROMUCOSAL | Status: AC

## 2017-03-22 MED ORDER — FAMOTIDINE IN NACL 20-0.9 MG/50ML-% IV SOLN
20.0000 mg | Freq: Two times a day (BID) | INTRAVENOUS | Status: AC
  Administered 2017-03-22 – 2017-03-23 (×2): 20 mg via INTRAVENOUS
  Filled 2017-03-22 (×2): qty 50

## 2017-03-22 MED ORDER — FENTANYL CITRATE (PF) 250 MCG/5ML IJ SOLN
INTRAMUSCULAR | Status: AC
  Filled 2017-03-22: qty 5

## 2017-03-22 MED ORDER — FENTANYL CITRATE (PF) 250 MCG/5ML IJ SOLN
INTRAMUSCULAR | Status: DC | PRN
  Administered 2017-03-22: 100 ug via INTRAVENOUS

## 2017-03-22 SURGICAL SUPPLY — 112 items
ADAPTER UNIV SWAN GANZ BIP (ADAPTER) ×2 IMPLANT
ADAPTER UNV SWAN GANZ BIP (ADAPTER) ×2
ADH SKN CLS APL DERMABOND .7 (GAUZE/BANDAGES/DRESSINGS) ×2
ADPR CATH UNV NS SG CATH (ADAPTER) ×1
ATTRACTOMAT 16X20 MAGNETIC DRP (DRAPES) IMPLANT
BAG BANDED W/RUBBER/TAPE 36X54 (MISCELLANEOUS) ×4 IMPLANT
BAG DECANTER FOR FLEXI CONT (MISCELLANEOUS) IMPLANT
BAG SNAP BAND KOVER 36X36 (MISCELLANEOUS) ×8 IMPLANT
BLADE 10 SAFETY STRL DISP (BLADE) ×4 IMPLANT
BLADE CLIPPER SURG (BLADE) IMPLANT
BLADE STERNUM SYSTEM 6 (BLADE) ×4 IMPLANT
CABLE ADAPT CONN TEMP 6FT (ADAPTER) ×4 IMPLANT
CABLE PACING FASLOC BIEGE (MISCELLANEOUS) ×4 IMPLANT
CABLE PACING FASLOC BLUE (MISCELLANEOUS) ×4 IMPLANT
CANISTER SUCT 3000ML PPV (MISCELLANEOUS) IMPLANT
CANNULA FEM VENOUS REMOTE 22FR (CANNULA) IMPLANT
CANNULA OPTISITE PERFUSION 16F (CANNULA) IMPLANT
CANNULA OPTISITE PERFUSION 18F (CANNULA) IMPLANT
CATH DIAG EXPO 6F VENT PIG 145 (CATHETERS) ×8 IMPLANT
CATH EXPO 5FR AL1 (CATHETERS) ×4 IMPLANT
CATH EXPO 5FR FR4 (CATHETERS) ×4 IMPLANT
CATH INFINITI 5FR JL4 (CATHETERS) ×4 IMPLANT
CATH S G BIP PACING (SET/KITS/TRAYS/PACK) ×8 IMPLANT
CATHETER LAUNCHER 6FR AL2 SH (CATHETERS) ×4 IMPLANT
CLIP VESOCCLUDE MED 24/CT (CLIP) ×4 IMPLANT
CLIP VESOCCLUDE SM WIDE 24/CT (CLIP) ×4 IMPLANT
CONT SPEC 4OZ CLIKSEAL STRL BL (MISCELLANEOUS) ×8 IMPLANT
COVER BACK TABLE 60X90IN (DRAPES) ×4 IMPLANT
COVER BACK TABLE 80X110 HD (DRAPES) ×4 IMPLANT
COVER DOME SNAP 22 D (MISCELLANEOUS) ×4 IMPLANT
COVER MAYO STAND STRL (DRAPES) ×4 IMPLANT
COVER PROBE W GEL 5X96 (DRAPES) ×4 IMPLANT
CRADLE DONUT ADULT HEAD (MISCELLANEOUS) ×4 IMPLANT
DERMABOND ADVANCED (GAUZE/BANDAGES/DRESSINGS) ×4
DERMABOND ADVANCED .7 DNX12 (GAUZE/BANDAGES/DRESSINGS) ×4 IMPLANT
DEVICE CLOSURE PERCLS PRGLD 6F (VASCULAR PRODUCTS) ×4 IMPLANT
DRAPE INCISE IOBAN 66X45 STRL (DRAPES) IMPLANT
DRAPE SLUSH MACHINE 52X66 (DRAPES) ×4 IMPLANT
DRSG TEGADERM 4X4.75 (GAUZE/BANDAGES/DRESSINGS) ×8 IMPLANT
ELECT REM PT RETURN 9FT ADLT (ELECTROSURGICAL) ×8
ELECTRODE REM PT RTRN 9FT ADLT (ELECTROSURGICAL) ×4 IMPLANT
FELT TEFLON 6X6 (MISCELLANEOUS) ×4 IMPLANT
FEMORAL VENOUS CANN RAP (CANNULA) IMPLANT
GAUZE SPONGE 4X4 12PLY STRL (GAUZE/BANDAGES/DRESSINGS) ×4 IMPLANT
GAUZE SPONGE 4X4 12PLY STRL LF (GAUZE/BANDAGES/DRESSINGS) ×8 IMPLANT
GLOVE BIO SURGEON STRL SZ7.5 (GLOVE) ×4 IMPLANT
GLOVE BIO SURGEON STRL SZ8 (GLOVE) ×8 IMPLANT
GLOVE EUDERMIC 7 POWDERFREE (GLOVE) ×4 IMPLANT
GLOVE ORTHO TXT STRL SZ7.5 (GLOVE) ×4 IMPLANT
GOWN STRL REUS W/ TWL LRG LVL3 (GOWN DISPOSABLE) ×6 IMPLANT
GOWN STRL REUS W/ TWL XL LVL3 (GOWN DISPOSABLE) ×12 IMPLANT
GOWN STRL REUS W/TWL LRG LVL3 (GOWN DISPOSABLE) ×9
GOWN STRL REUS W/TWL XL LVL3 (GOWN DISPOSABLE) ×18
GUIDEWIRE AMPLATZ STIFF 0.35 (WIRE) ×4 IMPLANT
GUIDEWIRE SAF TJ AMPL .035X180 (WIRE) ×4 IMPLANT
GUIDEWIRE SAFE TJ AMPLATZ EXST (WIRE) ×8 IMPLANT
GUIDEWIRE STRAIGHT .035 260CM (WIRE) ×4 IMPLANT
INSERT FOGARTY 61MM (MISCELLANEOUS) ×4 IMPLANT
INSERT FOGARTY SM (MISCELLANEOUS) IMPLANT
INSERT FOGARTY XLG (MISCELLANEOUS) IMPLANT
KIT BASIN OR (CUSTOM PROCEDURE TRAY) ×4 IMPLANT
KIT DILATOR VASC 18G NDL (KITS) IMPLANT
KIT HEART LEFT (KITS) ×4 IMPLANT
KIT ROOM TURNOVER OR (KITS) ×4 IMPLANT
KIT SUCTION CATH 14FR (SUCTIONS) ×8 IMPLANT
NEEDLE HYPO 25GX1X1/2 BEV (NEEDLE) ×4 IMPLANT
NEEDLE PERC 18GX7CM (NEEDLE) ×4 IMPLANT
NS IRRIG 1000ML POUR BTL (IV SOLUTION) ×12 IMPLANT
PACK AORTA (CUSTOM PROCEDURE TRAY) ×4 IMPLANT
PAD ARMBOARD 7.5X6 YLW CONV (MISCELLANEOUS) ×8 IMPLANT
PAD ELECT DEFIB RADIOL ZOLL (MISCELLANEOUS) ×4 IMPLANT
PATCH TACHOSII LRG 9.5X4.8 (VASCULAR PRODUCTS) IMPLANT
PERCLOSE PROGLIDE 6F (VASCULAR PRODUCTS) ×8
SET MICROPUNCTURE 5F STIFF (MISCELLANEOUS) ×4 IMPLANT
SHEATH AVANTI 11CM 8FR (MISCELLANEOUS) ×4 IMPLANT
SHEATH BRITE TIP 6FR 35CM (SHEATH) ×4 IMPLANT
SHEATH PINNACLE 6F 10CM (SHEATH) ×8 IMPLANT
SHEATH PINNACLE 8F 10CM (SHEATH) ×4 IMPLANT
SLEEVE REPOSITIONING LENGTH 30 (MISCELLANEOUS) ×4 IMPLANT
SPONGE LAP 4X18 X RAY DECT (DISPOSABLE) ×4 IMPLANT
STOPCOCK 4 WAY LG BORE MALE ST (IV SETS) ×4 IMPLANT
STOPCOCK MORSE 400PSI 3WAY (MISCELLANEOUS) ×24 IMPLANT
SUT ETHIBOND X763 2 0 SH 1 (SUTURE) IMPLANT
SUT GORETEX CV 4 TH 22 36 (SUTURE) IMPLANT
SUT GORETEX CV4 TH-18 (SUTURE) IMPLANT
SUT GORETEX TH-18 36 INCH (SUTURE) IMPLANT
SUT MNCRL AB 3-0 PS2 18 (SUTURE) IMPLANT
SUT PROLENE 3 0 SH1 36 (SUTURE) IMPLANT
SUT PROLENE 4 0 RB 1 (SUTURE)
SUT PROLENE 4-0 RB1 .5 CRCL 36 (SUTURE) IMPLANT
SUT PROLENE 5 0 C 1 36 (SUTURE) IMPLANT
SUT PROLENE 6 0 C 1 30 (SUTURE) IMPLANT
SUT SILK  1 MH (SUTURE) ×2
SUT SILK 1 MH (SUTURE) ×2 IMPLANT
SUT SILK 2 0 SH CR/8 (SUTURE) IMPLANT
SUT VIC AB 2-0 CT1 27 (SUTURE)
SUT VIC AB 2-0 CT1 TAPERPNT 27 (SUTURE) IMPLANT
SUT VIC AB 2-0 CTX 36 (SUTURE) IMPLANT
SUT VIC AB 3-0 SH 8-18 (SUTURE) IMPLANT
SYR 10ML LL (SYRINGE) ×12 IMPLANT
SYR 30ML LL (SYRINGE) ×8 IMPLANT
SYR 50ML LL SCALE MARK (SYRINGE) ×4 IMPLANT
SYR CONTROL 10ML LL (SYRINGE) ×4 IMPLANT
TOWEL OR 17X26 10 PK STRL BLUE (TOWEL DISPOSABLE) ×8 IMPLANT
TRANSDUCER W/STOPCOCK (MISCELLANEOUS) ×8 IMPLANT
TRAY FOLEY SILVER 14FR TEMP (SET/KITS/TRAYS/PACK) ×4 IMPLANT
TUBE SUCT INTRACARD DLP 20F (MISCELLANEOUS) IMPLANT
TUBING HIGH PRESSURE 120CM (CONNECTOR) ×4 IMPLANT
VALVE HEART TRANSCATH SZ3 26MM (Prosthesis & Implant Heart) ×4 IMPLANT
WIRE .035 3MM-J 145CM (WIRE) ×4 IMPLANT
WIRE AMPLATZ SS-J .035X180CM (WIRE) ×4 IMPLANT
WIRE BENTSON .035X145CM (WIRE) ×4 IMPLANT

## 2017-03-22 NOTE — Transfer of Care (Signed)
Immediate Anesthesia Transfer of Care Note  Patient: Douglas Lane  Procedure(s) Performed: Procedure(s): TRANSCATHETER AORTIC VALVE REPLACEMENT, TRANSFEMORAL (N/A) TRANSESOPHAGEAL ECHOCARDIOGRAM (TEE) (N/A)  Patient Location: ICU  Anesthesia Type:MAC  Level of Consciousness: awake, alert  and oriented  Airway & Oxygen Therapy: Patient Spontanous Breathing and Patient connected to nasal cannula oxygen  Post-op Assessment: Report given to RN, Post -op Vital signs reviewed and stable and Patient moving all extremities X 4  Post vital signs: Reviewed and stable  Last Vitals:  Vitals:   03/22/17 0938 03/22/17 0943  BP:    Pulse: (!) 138 (!) 118  Resp:    Temp:      Last Pain:  Vitals:   03/22/17 0547  TempSrc: Oral      Patients Stated Pain Goal: 3 (42/68/34 1962)  Complications: No apparent anesthesia complications

## 2017-03-22 NOTE — Anesthesia Procedure Notes (Addendum)
Arterial Line Insertion Start/End8/02/2017 7:34 AM, 03/22/2017 7:39 AM Performed by: Oleta Mouse  Patient location: OR. Preanesthetic checklist: patient identified, IV checked, site marked, risks and benefits discussed, surgical consent, monitors and equipment checked, pre-op evaluation, timeout performed and anesthesia consent Patient sedated Right, radial was placed Catheter size: 20 Fr Hand hygiene performed  and maximum sterile barriers used   Attempts: 1 Procedure performed without using ultrasound guided technique. Following insertion, dressing applied and Biopatch. Post procedure assessment: normal and unchanged  Patient tolerated the procedure well with no immediate complications.

## 2017-03-22 NOTE — Anesthesia Procedure Notes (Addendum)
Arterial Line Insertion Start/End8/02/2017 7:45 AM, 03/22/2017 7:45 AM Performed by: Oleta Mouse, anesthesiologist  Patient location: OR. Preanesthetic checklist: patient identified, IV checked, site marked, risks and benefits discussed, surgical consent, monitors and equipment checked and timeout performed Patient sedated Right, radial was placed Catheter size: 20 G  Attempts: 1 Procedure performed without using ultrasound guided technique. Following insertion, dressing applied and Biopatch. Post procedure assessment: normal  Patient tolerated the procedure well with no immediate complications.

## 2017-03-22 NOTE — Anesthesia Procedure Notes (Signed)
Central Venous Catheter Insertion Performed by: Oleta Mouse, anesthesiologist Start/End8/02/2017 7:19 AM, 03/22/2017 7:23 AM Patient location: Pre-op. Preanesthetic checklist: patient identified, IV checked, site marked, risks and benefits discussed, surgical consent, monitors and equipment checked, pre-op evaluation, timeout performed and anesthesia consent Lidocaine 1% used for infiltration and patient sedated Hand hygiene performed  and maximum sterile barriers used  Catheter size: 8 Fr Total catheter length 16. Central line was placed.Double lumen Procedure performed using ultrasound guided technique. Ultrasound Notes:anatomy identified, needle tip was noted to be adjacent to the nerve/plexus identified, no ultrasound evidence of intravascular and/or intraneural injection and image(s) printed for medical record Attempts: 1 Following insertion, dressing applied, line sutured and Biopatch. Post procedure assessment: blood return through all ports, free fluid flow and no air  Patient tolerated the procedure well with no immediate complications.

## 2017-03-22 NOTE — Op Note (Signed)
[facility]   MULTIDISCIPLINARY HEART VALVE TEAM   TAVR OPERATIVE NOTE   Date of Procedure:  03/22/2017  Preoperative Diagnosis: Severe Aortic Stenosis   Postoperative Diagnosis: Same   Procedure:    Transcatheter Aortic Valve Replacement - Percutaneous Right Transfemoral Approach  Edwards Sapien 3 THV (size 26 mm, model # 9600TFX, serial # [id number])   Co-Surgeons: Gaye Pollack, MD and Sherren Mocha, MD   Anesthesiologist:  Laurie Panda, MD  Echocardiographer:  [name], MD  Pre-operative Echo Findings:  Severe aortic stenosis  Moderate aortic insufficiency  Normal left ventricular systolic function  Post-operative Echo Findings:  mild paravalvular leak  Normal left ventricular systolic function   BRIEF CLINICAL NOTE AND INDICATIONS FOR SURGERY  The patient is a 71 year old gentleman with dyslipidemia, CAD s/p PCI of a 90% proximal to mid RCA with DES in 03/2016, and known aortic stenosis. Prior to his cath and PCI he presented with exertional chest discomfort and shortness of breath. An echo in 02/2015 has shown moderate AS with a mean gradient of 31 mm Hg. He had a gated nuclear stress test on 03/19/2016 which was an intermediate risk study with an inferior infarct and no reversible ischemia prompting the cath and subsequent PCI. He also had 40% diffuse proximal LAD narrowing and 50% ostial PDA stenosis. The mean AV gradient was 41.3 mm Hg with an AVA of 0.85. The valve was easy to cross so there was some concern about the gradient being in the subvalvular region. His 2D echo on 03/19/2016 had shown a mean gradient of 34 mm Hg with an LVOT peak gradient of only 3 mm Hg. He has done well since his PCI. He says that he has occasional exertional chest pressure but nothing like before. He has some fatigue and shortness of breath with exertion such as walking up hills or walking his wild dog but he stops and it resolves quickly. He has had no dizziness or  syncope. He had a repeat 2D echo on 10/21/2016 showing a mean gradient of 34 mm Hg and a DI of 0.22. The AV is thickened and calcified with restricted motion. A TEE was performed on 11/01/2016 and this showed severe AS with a mean gradient of 53 mm Hg and a peak of 92 mm Hg. There was mild AI. The LVOT peak gradient was only 4 mm Hg. There was no subvalvular obstruction. The LVEF was normal at 55-60%.  has stage D, severe, symptomatic aortic stenosis with some fatigue and shortness of breath with exertion but he is able to do his normal daily activities without difficulty. He is probably NYHA functional class 1-2. I have personally reviewed his 2D echos, TEE and cath. 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 The mean AV gradient by TEE is 53 mm Hg consistent with severe AS and there is no evidence of subvalvular stenosis. 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 It has been 12 months since his PCI and hopefully the RCA is still widely patent since it was a good result and he has not had return of significant chest discomfort. His STS PROM is only 2.1% for AVR and CABG but I think he would have a difficult time recovering from that due to his severe bilateral knee DJD that limits his ambulation and will require a knee replacement once his  aortic stenosis is treated. I think he is probably intermediate risk for open surgical treatment of his AS due to his limited mobility.  I think TAVR would be the best treatment for him. His cardiac CT shows anatomy favorable for a Sapien 3 valve. I reviewed the CT images with him and his wife and answered their questions. I will discuss with interventional cardiology doing a right coronary artery angiogram at the time of the procedure to assess the RCA stent.   The patient and his wife werecounseled at length regarding  treatment alternatives for management of severe symptomatic aortic stenosis. The risks and benefits of surgical intervention has been discussed in detail. Long-term prognosis with medical therapy was discussed. Alternative approaches such as conventional surgical aortic valve replacement, transcatheter aortic valve replacement, and palliative medical therapy were compared and contrasted at length. This discussion was placed in the context of the patient's own specific clinical presentation and past medical history. All of their questions been addressed.   The patient has been advised of a variety of complications that might develop including but not limited to risks of death, stroke, paravalvular leak, aortic dissection or other major vascular complications, aortic annulus rupture, device embolization, cardiac rupture or perforation, mitral regurgitation, acute myocardial infarction, arrhythmia, heart block or bradycardia requiring permanent pacemaker placement, congestive heart failure, respiratory failure, renal failure, pneumonia, infection, other late complications related to structural valve deterioration or migration, or other complications that might ultimately cause a temporary or permanent loss of functional independence or other long term morbidity. The patient provides full informed consent for the procedure as described and all questions were answered.    DETAILS OF THE OPERATIVE PROCEDURE  PREPARATION:    The patient is brought to the operating room on the above mentioned date and central monitoring was established by the anesthesia team including placement of a central venous line and radial arterial line. The patient is placed in the supine position on the operating table.  Intravenous antibiotics are administered. The patient is monitored closely throughout the procedure under conscious sedation  Baseline transthoracic echocardiogram was performed. The patient's chest, abdomen, both  groins, and both lower extremities are prepared and draped in a sterile manner. A time out procedure is performed.   PERIPHERAL ACCESS:    Using the modified Seldinger technique, femoral arterial and venous access was obtained with placement of 6 Fr sheaths on the left side.  A pigtail diagnostic catheter was passed through the left arterial sheath under fluoroscopic guidance into the aortic root.  A temporary transvenous pacemaker catheter was passed through the left femoral venous sheath under fluoroscopic guidance into the right ventricle.  The pacemaker was tested to ensure stable lead placement and pacemaker capture. Aortic root angiography was performed in order to determine the optimal angiographic angle for valve deployment. Cardiac cath was performed via the left femoral arterial sheath by Dr. Burt Knack and will be included in his note.   TRANSFEMORAL ACCESS:   Percutaneous transfemoral access and sheath placement was performed by Dr. Burt Knack using ultrasound guidance.  The right common femoral artery was cannulated using a micropuncture needle and appropriate location was verified using hand injection angiogram.  A pair of Abbott Perclose percutaneous closure devices were placed and a 6 French sheath replaced into the femoral artery.  The patient was heparinized systemically and ACT verified > 250 seconds.    A 14 Fr transfemoral E-sheath was introduced into the right femoral artery after progressively dilating over an Amplatz superstiff wire.  A pigtail catheter passed across the aortic valve and position was confirmed in the LV apex. Simultaneous LV and Ao pressures were recorded.  The peak to peak gradient was 60 mm Hg. The pigtail catheter was exchanged for an Amplatz Extra-stiff wire in the LV apex.  Echocardiography was utilized to confirm appropriate wire position and no sign of entanglement in the mitral subvalvular apparatus.   BALLOON AORTIC VALVULOPLASTY:   Not  performed  TRANSCATHETER HEART VALVE DEPLOYMENT:   An Edwards Sapien 3 transcatheter heart valve (size 26 mm, model #9600TFX, serial #[id number]) was prepared and crimped per manufacturer's guidelines, and the proper orientation of the valve is confirmed on the [employer] delivery system. The valve was advanced through the introducer sheath using normal technique until in an appropriate position in the abdominal aorta beyond the sheath tip. The balloon was then retracted and using the fine-tuning wheel was centered on the valve. The valve was then advanced across the aortic arch using appropriate flexion of the catheter. The valve was carefully positioned across the aortic valve annulus. The Commander catheter was retracted using normal technique. Once final position of the valve has been confirmed by angiographic assessment, the valve is deployed while temporarily holding ventilation and during rapid ventricular pacing to maintain systolic blood pressure < 50 mmHg and pulse pressure < 10 mmHg. The balloon inflation is held for >3 seconds after reaching full deployment volume. Once the balloon has fully deflated the balloon is retracted into the ascending aorta and valve function is assessed using echocardiography. There is felt to be mild paravalvular leak at one location and no central aortic insufficiency. An aortic root injection showed mild AI.  The patient's hemodynamic recovery following valve deployment is good.  The deployment balloon and guidewire are both removed.    PROCEDURE COMPLETION:   The sheath was removed and femoral artery closure performed by Dr Burt Knack.  Protamine was administered once femoral arterial repair was complete. The temporary pacemaker, pigtail catheters and femoral sheaths were removed with manual pressure used for hemostasis.   The patient tolerated the procedure well and is transported to the surgical intensive care in stable condition. There were no immediate  intraoperative complications. All sponge instrument and needle counts are verified correct at completion of the operation.   No blood products were administered during the operation.  The patient received a total of 92 mL of intravenous contrast during the procedure.   Gaye Pollack, MD 03/22/2017 10:55 AM

## 2017-03-22 NOTE — Interval H&P Note (Signed)
History and Physical Interval Note:  03/22/2017 6:55 AM  Douglas Lane  has presented today for surgery, with the diagnosis of SEVERE AS  The various methods of treatment have been discussed with the patient and family. After consideration of risks, benefits and other options for treatment, the patient has consented to  Procedure(s): TRANSCATHETER AORTIC VALVE REPLACEMENT, TRANSFEMORAL (N/A) TRANSESOPHAGEAL ECHOCARDIOGRAM (TEE) (N/A) as a surgical intervention .  The patient's history has been reviewed, patient examined, no change in status, stable for surgery.  I have reviewed the patient's chart and labs.  Questions were answered to the patient's satisfaction.     Gaye Pollack

## 2017-03-22 NOTE — Anesthesia Preprocedure Evaluation (Addendum)
Anesthesia Evaluation  Patient identified by MRN, date of birth, ID band Patient awake    Reviewed: Allergy & Precautions, NPO status , Patient's Chart, lab work & pertinent test results  History of Anesthesia Complications Negative for: history of anesthetic complications  Airway Mallampati: III  TM Distance: >3 FB Neck ROM: Full    Dental  (+) Teeth Intact   Pulmonary shortness of breath, neg sleep apnea, neg COPD, neg recent URI,    breath sounds clear to auscultation       Cardiovascular (-) angina+ CAD, + Cardiac Stents and +CHF  + Valvular Problems/Murmurs AS  Rhythm:Regular + Systolic murmurs    Neuro/Psych Seizures -, Well Controlled,     GI/Hepatic negative GI ROS, Neg liver ROS,   Endo/Other  negative endocrine ROS  Renal/GU negative Renal ROS     Musculoskeletal  (+) Arthritis ,   Abdominal   Peds  Hematology negative hematology ROS (+)   Anesthesia Other Findings   Reproductive/Obstetrics                             Anesthesia Physical Anesthesia Plan  ASA: IV  Anesthesia Plan: MAC   Post-op Pain Management:    Induction: Intravenous  PONV Risk Score and Plan: 2 and Ondansetron and Dexamethasone  Airway Management Planned: Simple Face Mask and Nasal Cannula  Additional Equipment: Arterial line and Ultrasound Guidance Line Placement  Intra-op Plan:   Post-operative Plan: Extubation in OR  Informed Consent: I have reviewed the patients History and Physical, chart, labs and discussed the procedure including the risks, benefits and alternatives for the proposed anesthesia with the patient or authorized representative who has indicated his/her understanding and acceptance.     Plan Discussed with: CRNA and Surgeon  Anesthesia Plan Comments:        Anesthesia Quick Evaluation

## 2017-03-22 NOTE — Progress Notes (Signed)
PM Round:  Pt doing well Wife at bedside. Hemodynamically stable. Groin sites clear with mild bruising right groin no hematoma.   Douglas Lane 03/22/2017 5:33 PM

## 2017-03-22 NOTE — Progress Notes (Signed)
Smokey RN discontinued left sheath at bedside held pressure for 20 minutes. Level 0. Right sheath pulled prior to arrival to unit  Dermabond. Level 1. bilateral pedal Pulses 2+.

## 2017-03-22 NOTE — Op Note (Signed)
[facility]   MULTIDISCIPLINARY HEART VALVE TEAM   TAVR OPERATIVE NOTE   Date of Procedure:  03/22/2017  Preoperative Diagnosis: Severe Aortic Stenosis   Postoperative Diagnosis: Same   Procedure:    Transcatheter Aortic Valve Replacement - Percutaneous Transfemoral Approach  Edwards Sapien 3 THV (size 26 mm, model # 9600TFX, serial # [id number])   Co-Surgeons:  Gaye Pollack, MD and Sherren Mocha, MD  Anesthesiologist:  Laurie Panda, MD  Echocardiographer:  [name], MD  Pre-operative Echo Findings:  Severe aortic stenosis with moderate AI  Normal left ventricular systolic function  Post-operative Echo Findings:  Mild paravalvular leak  Normal left ventricular systolic function  BRIEF CLINICAL NOTE AND INDICATIONS FOR SURGERY  71 year old gentleman with coronary artery disease and previous stenting of the right coronary artery has developed progressive symptoms of severe aortic stenosis. He describes New York Heart Association functional class II symptoms of chronic diastolic heart failure. He underwent extensive preoperative testing and evaluation by 2 cardiac surgeons (Dr. Cyndia Bent and Dr. Servando Snare). After review of all the data he is referred for TAVR via a transfemoral approach. His last coronary angiogram was performed one year ago when he underwent PCI of the right coronary artery. We elected to perform coronary angiography in the operating room at the time of his TAVR procedure.  During the course of the patient's preoperative work up they have been evaluated comprehensively by a multidisciplinary team of specialists coordinated through the [facility] in the [facility].  They have been demonstrated to suffer from symptomatic severe aortic stenosis as noted above. The patient has been counseled extensively as to the relative risks and benefits of all options for the treatment of severe aortic  stenosis including long term medical therapy, conventional surgery for aortic valve replacement, and transcatheter aortic valve replacement.  The patient has been independently evaluated by two cardiac surgeons including Dr. Cyndia Bent and Dr. Servando Snare, and they are felt to be at intermediate risk for conventional surgical aortic valve replacement. Based upon review of all of the patient's preoperative diagnostic tests they are felt to be candidate for transcatheter aortic valve replacement using the transfemoral approach as an alternative to conventional surgery.    Following the decision to proceed with transcatheter aortic valve replacement, a discussion has been held regarding what types of management strategies would be attempted intraoperatively in the event of life-threatening complications, including whether or not the patient would be considered a candidate for the use of cardiopulmonary bypass and/or conversion to open sternotomy for attempted surgical intervention.  The patient has been advised of a variety of complications that might develop peculiar to this approach including but not limited to risks of death, stroke, paravalvular leak, aortic dissection or other major vascular complications, aortic annulus rupture, device embolization, cardiac rupture or perforation, acute myocardial infarction, arrhythmia, heart block or bradycardia requiring permanent pacemaker placement, congestive heart failure, respiratory failure, renal failure, pneumonia, infection, other late complications related to structural valve deterioration or migration, or other complications that might ultimately cause a temporary or permanent loss of functional independence or other long term morbidity.  The patient provides full informed consent for the procedure as described and all questions were answered preoperatively.    DETAILS OF THE OPERATIVE PROCEDURE  PREPARATION:   The patient is brought to the operating room on the  above mentioned date and central monitoring was established by the anesthesia team including placement of Swan-Ganz catheter and radial arterial  line. The patient is placed in the supine position on the operating table.  Intravenous antibiotics are administered. The patient is monitored closely throughout the procedure under conscious sedation.  Baseline transthoracic echocardiogram was performed. The patient's chest, abdomen, both groins, and both lower extremities are prepared and draped in a sterile manner. A time out procedure is performed.   PERIPHERAL ACCESS:   Using the modified Seldinger technique with ultrasound access, femoral arterial and venous access was obtained with placement of 6 Fr sheaths on the left side.  The patient underwent selective coronary angiography prior to TAVR. For the right coronary artery, a no torque 5 French catheter was used. For the left coronary artery a JL4 catheter was used. Multiple views were obtained of the left coronary artery. A pigtail diagnostic catheter was passed through the left femoral arterial sheath under fluoroscopic guidance into the aortic root.  A temporary transvenous pacemaker catheter was passed through the left femoral venous sheath under fluoroscopic guidance into the right ventricle.  The pacemaker was tested to ensure stable lead placement and pacemaker capture. Aortic root angiography was performed in order to determine the optimal angiographic angle for valve deployment.   TRANSFEMORAL ACCESS:  A micropuncture technique is used to access the femoral artery under ultrasound and fluoroscopic guidance. 2 Perclose devices are deployed at 10' and 2' positions to 'PreClose' the femoral artery. An 8 French sheath is placed and then an Amplatz Superstiff wire is advanced through the sheath. This is changed out for a 14 French transfemoral E-Sheath after progressively dilating over the Superstiff wire.  A pigtail catheter is passed across the native  aortic valve into the left ventricle. This was exchanged out for a pigtail catheter and position was confirmed in the LV apex. Simultaneous LV and Ao pressures were recorded.  The pigtail catheter was exchanged for an Amplatz Extra-stiff wire in the LV apex.  Echocardiography was utilized to confirm appropriate wire position and no sign of entanglement in the mitral subvalvular apparatus.  CORONARY FINDINGS: Right coronary artery: Large, dominant vessel. The stented segment in the mid vessel is widely patent with no evidence of restenosis. There are minor luminal irregularities but no significant stenoses in the remaining portions of the proximal, mid, or distal RCA. The PDA and PLA branches are patent.  Left Main: Widely patent vessel with mild calcification and no obstructive disease. Vessel divides into the LAD and left circumflex  LAD: The LAD reaches the left ventricular apex. The vessel is patent to the apex without high-grade stenosis. There is a 30-40% proximal vessel stenosis that is unchanged from the previous cath study. The diagonal branches are patent without significant disease.  Left circumflex: Circumflex is large caliber vessel. There are mild diffuse irregularities. The proximal/ostial circumflex has 30% stenosis. There are 3 OM branches without significant stenoses.  TRANSCATHETER HEART VALVE DEPLOYMENT:  An Edwards Sapien 3 transcatheter heart valve (size 26 mm, model #9600TFX, serial #[id number]) was prepared and crimped per manufacturer's guidelines, and the proper orientation of the valve is confirmed on the [employer] delivery system. The valve was advanced through the introducer sheath using normal technique until in an appropriate position in the abdominal aorta beyond the sheath tip. The balloon was then retracted and using the fine-tuning wheel was centered on the valve. The valve was then advanced across the aortic arch using appropriate flexion of the catheter. The valve  was carefully positioned across the aortic valve annulus. The Commander catheter was retracted using normal technique. Once  final position of the valve has been confirmed by angiographic assessment, the valve is deployed while temporarily holding ventilation and during rapid ventricular pacing to maintain systolic blood pressure < 50 mmHg and pulse pressure < 10 mmHg. The balloon inflation is held for >3 seconds after reaching full deployment volume. Once the balloon has fully deflated the balloon is retracted into the ascending aorta and valve function is assessed using echocardiography. There is felt to be mild paravalvular leak and no central aortic insufficiency.  The patient's hemodynamic recovery following valve deployment is good.  The deployment balloon and guidewire are both removed. Echo demostrated acceptable post-procedural gradients, stable mitral valve function, and mild aortic insufficiency. Aortography confirmed no greater than mild aortic insufficiency.    PROCEDURE COMPLETION:  The sheath was removed and femoral artery closure is performed using the 2 previously deployed Perclose devices.  Protamine was administered once femoral arterial repair was complete. The temporary pacemaker, pigtail catheters and femoral sheaths were removed with manual pressure used for hemostasis.   The patient tolerated the procedure well and is transported to the surgical intensive care in stable condition. There were no immediate intraoperative complications. All sponge instrument and needle counts are verified correct at completion of the operation.   The patient received a total of 96 mL of intravenous contrast during the procedure.   Sherren Mocha, MD 03/22/2017 10:46 AM

## 2017-03-22 NOTE — Progress Notes (Signed)
  Echocardiogram 2D Echocardiogram has been performed.  Jennette Dubin 03/22/2017, 10:08 AM

## 2017-03-23 ENCOUNTER — Telehealth: Payer: Self-pay | Admitting: Cardiovascular Disease

## 2017-03-23 ENCOUNTER — Inpatient Hospital Stay (HOSPITAL_COMMUNITY): Payer: Medicare Other

## 2017-03-23 DIAGNOSIS — Z952 Presence of prosthetic heart valve: Secondary | ICD-10-CM

## 2017-03-23 DIAGNOSIS — I350 Nonrheumatic aortic (valve) stenosis: Principal | ICD-10-CM

## 2017-03-23 DIAGNOSIS — Z954 Presence of other heart-valve replacement: Secondary | ICD-10-CM

## 2017-03-23 LAB — BASIC METABOLIC PANEL
Anion gap: 9 (ref 5–15)
BUN: 18 mg/dL (ref 6–20)
CO2: 23 mmol/L (ref 22–32)
CREATININE: 0.92 mg/dL (ref 0.61–1.24)
Calcium: 8.3 mg/dL — ABNORMAL LOW (ref 8.9–10.3)
Chloride: 105 mmol/L (ref 101–111)
GFR calc Af Amer: >60 mL/min (ref >60)
Glucose, Bld: 111 mg/dL — ABNORMAL HIGH (ref 65–99)
POTASSIUM: 3.9 mmol/L (ref 3.5–5.1)
SODIUM: 137 mmol/L (ref 135–145)

## 2017-03-23 LAB — CBC
HCT: 37.7 % — ABNORMAL LOW (ref 39.0–52.0)
Hemoglobin: 12.2 g/dL — ABNORMAL LOW (ref 13.0–17.0)
MCH: 29 pg (ref 26.0–34.0)
MCHC: 32.4 g/dL (ref 30.0–36.0)
MCV: 89.5 fL (ref 78.0–100.0)
PLATELETS: 141 10*3/uL — AB (ref 150–400)
RBC: 4.21 MIL/uL — AB (ref 4.22–5.81)
RDW: 14.2 % (ref 11.5–15.5)
WBC: 9 10*3/uL (ref 4.0–10.5)

## 2017-03-23 LAB — ECHOCARDIOGRAM LIMITED
Height: 66 in
Weight: 2906.54 oz

## 2017-03-23 LAB — MAGNESIUM: Magnesium: 1.8 mg/dL (ref 1.7–2.4)

## 2017-03-23 NOTE — Progress Notes (Signed)
CARDIAC REHAB PHASE I   PRE:  Rate/Rhythm: 71 SR  BP:  Supine: 119/48  Sitting:   Standing:    SaO2: 96%RA  MODE:  Ambulation: 370 ft   POST:  Rate/Rhythm: 90 SR  BP:  Supine:   Sitting: 124/55  Standing:    SaO2: 94%RA 1125-1155 Pt walked 370 ft on RA with fairly steady gait. No DOE noted. Sats good on RA. To sitting on side of bed after walk. Reviewed walking for ex and discussed attending CRP 2 again in [place]. Will refer. Pt has attended heart healthy diet classes in Phase 2 and feels comfortable with diet. Left heart healthy diet for review.    Graylon Good, RN BSN  03/23/2017 11:51 AM

## 2017-03-23 NOTE — Progress Notes (Signed)
  Echocardiogram 2D Echocardiogram has been performed.  Jennette Dubin 03/23/2017, 11:32 AM

## 2017-03-23 NOTE — Discharge Summary (Signed)
Discharge Summary    Patient ID: Douglas Lane,  MRN: [id number], DOB/AGE: 01-05-1946 71 y.o.  Admit date: 03/22/2017 Discharge date: 03/23/2017   Primary Care Provider: Lawerance Cruel Primary Cardiologist: Dr. Radford Pax  Discharge Diagnoses    Principal Problem:   Severe aortic stenosis Active Problems:   Rheumatic fever   Heart murmur   Abnormal EKG   LVH (left ventricular hypertrophy)   Abnormal nuclear stress test   Dyslipidemia   CAD S/P percutaneous coronary angioplasty   Coronary artery disease involving native coronary artery of native heart with angina pectoris (HCC)   S/P TAVR (transcatheter aortic valve replacement)   Allergies Allergies  Allergen Reactions  . No Known Allergies      History of Present Illness     The patient is a 71 year old gentleman with dyslipidemia, CAD s/p PCI of a 90% proximal to mid RCA with DES in 03/2016, and known aortic stenosis. Prior to his cath and PCI he presented with exertional chest discomfort and shortness of breath. An echo in 02/2015 has shown moderate AS with a mean gradient of 31 mm Hg. He had a gated nuclear stress test on 03/19/2016 which was an intermediate risk study with an inferior infarct and no reversible ischemia prompting the cath and subsequent PCI. He also had 40% diffuse proximal LAD narrowing and 50% ostial PDA stenosis. The mean AV gradient was 41.3 mm Hg with an AVA of 0.85. The valve was easy to cross so there was some concern about the gradient being in the subvalvular region. His 2D echo on 03/19/2016 had shown a mean gradient of 34 mm Hg with an LVOT peak gradient of only 3 mm Hg. He has done well since his PCI. He says that he has occasional exertional chest pressure but nothing like before. He has some fatigue and shortness of breath with exertion such as walking up hills or walking his wild dog but he stops and it resolves quickly. He has had no dizziness or syncope. He had a repeat 2D echo on 10/21/2016  showing a mean gradient of 34 mm Hg and a DI of 0.22. The AV is thickened and calcified with restricted motion. A TEE was performed on 11/01/2016 and this showed severe AS with a mean gradient of 53 mm Hg and a peak of 92 mm Hg. There was mild AI. The LVOT peak gradient was only 4 mm Hg. There was no subvalvular obstruction. The LVEF was normal at 55-60%.  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 He is married and lives with his wife. He can still do work around [employer] and mow his yard. He walks his dog. He has been under a fair amount of stress taking care of his wife's parents who are over 14 years old.  Hospital Course     Consultants: None   Patient underwent TAVR and tolerated the procedure well.   Transcatheter Aortic Valve Replacement - Percutaneous Transfemoral Approach             Edwards Sapien 3 THV (size 26 mm, model # 9600TFX, serial # [id number])  Lopressor was held due to bradycardia in the upper 50s. Will continue ASA and plavix for 6 months, then 81 mg ASA indefinitely. Echo was repeated on POD #1 and reviewed by Dr. Burt Knack. Groin site with mild bruising, no hematoma.  He will continue medical management of his CAD.  Lopressor was held at discharge due to bradycardia.  Patient seen and examined by Dr. Burt Knack today and was stable for discharge. All follow up has been arranged.  _____________  Discharge Vitals Blood pressure (!) 128/59, pulse 61, temperature 97.8 F (36.6 C), temperature source Oral, resp. rate 15, height 5\' 6"  (1.676 m), weight 181 lb 10.5 oz (82.4 kg), SpO2 92 %.  Filed Weights   03/22/17 0547 03/23/17 0700  Weight: 177 lb (80.3 kg) 181 lb 10.5 oz (82.4 kg)    Labs & Radiologic Studies    CBC  Recent Labs  03/22/17 1044 03/23/17 0402  WBC 6.8 9.0  HGB 13.6  13.6 12.2*  HCT 42.0  40.0 37.7*  MCV 89.7 89.5  PLT 151 338*   Basic Metabolic Panel  Recent Labs  03/22/17 0954  03/22/17 1044 03/23/17 0402  NA 136 138 137  K 4.5 4.5 3.9  CL 101  --  105  CO2  --   --  23  GLUCOSE 130* 115* 111*  BUN 16  --  18  CREATININE 0.70  --  0.92  CALCIUM  --   --  8.3*  MG  --   --  1.8   Liver Function Tests No results for input(s): AST, ALT, ALKPHOS, BILITOT, PROT, ALBUMIN in the last 72 hours. No results for input(s): LIPASE, AMYLASE in the last 72 hours. Cardiac Enzymes No results for input(s): CKTOTAL, CKMB, CKMBINDEX, TROPONINI in the last 72 hours. BNP Invalid input(s): POCBNP D-Dimer No results for input(s): DDIMER in the last 72 hours. Hemoglobin A1C No results for input(s): HGBA1C in the last 72 hours. Fasting Lipid Panel No results for input(s): CHOL, HDL, LDLCALC, TRIG, CHOLHDL, LDLDIRECT in the last 72 hours. Thyroid Function Tests No results for input(s): TSH, T4TOTAL, T3FREE, THYROIDAB in the last 72 hours.  Invalid input(s): FREET3 _____________  Dg Chest 2 View  Result Date: 03/17/2017 CLINICAL DATA:  Preoperative examination prior to aortic valve replacement. History of aortic stenosis, mitral regurgitation, coronary artery disease status post angioplasty, CHF. EXAM: CHEST  2 VIEW COMPARISON:  Chest x-ray of Dec 19, 2015 FINDINGS: The lungs are adequately inflated. There is no focal infiltrate. The heart and pulmonary vascularity are normal. The mediastinum is normal in width. There is calcification in the wall of the aortic arch. There is no pleural effusion. There are multiple old posterior rib fractures on the right. There is multilevel degenerative disc disease of the thoracic spine. IMPRESSION: There is no pneumonia, CHF, nor other acute cardiopulmonary abnormality. Thoracic aortic atherosclerosis. Electronically Signed   By: David  Martinique M.D.   On: 03/17/2017 13:02   Dg Chest Port 1 View  Result Date: 03/22/2017 CLINICAL DATA:  Status post transcatheter aortic valve replacement. EXAM: PORTABLE CHEST 1 VIEW COMPARISON:  03/17/2017 FINDINGS:  Evidence for a transcatheter aortic valve replacement. There is a right jugular catheter with the tip in the lower SVC region. Patchy densities in the right upper lung may be related to atelectasis. Negative for pneumothorax. Heart size is grossly stable. IMPRESSION: Patchy densities in right upper lung may be related to atelectasis. Recommend attention on follow-up. Central line tip in the lower SVC region. Negative for a pneumothorax. Electronically Signed   By: Markus Daft M.D.   On: 03/22/2017 11:41   Ct Angio Chest Aorta W/cm &/or Wo/cm  Result Date: 03/08/2017 CLINICAL DATA:  71 year old male with history of severe aortic stenosis. Preprocedural study prior to potential transcatheter aortic valve replacement (TAVR) procedure.  EXAM: CT ANGIOGRAPHY CHEST, ABDOMEN AND PELVIS TECHNIQUE: Multidetector CT imaging through the chest, abdomen and pelvis was performed using the standard protocol during bolus administration of intravenous contrast. Multiplanar reconstructed images and MIPs were obtained and reviewed to evaluate the vascular anatomy. CONTRAST:  100 mL of Isovue 370. COMPARISON:  Cardiac CTA 02/02/2017. CT of the chest, abdomen and pelvis 12/21/2011. FINDINGS: CTA CHEST FINDINGS Cardiovascular: Heart size is borderline enlarged with concentric left ventricular hypertrophy. There is no significant pericardial fluid, thickening or pericardial calcification. There is aortic atherosclerosis, as well as atherosclerosis of the great vessels of the mediastinum and the coronary arteries, including calcified atherosclerotic plaque in the left main, left anterior descending, left circumflex and right coronary arteries. Severe thickening calcification of the aortic valve. Mediastinum/Lymph Nodes: No pathologically enlarged mediastinal or hilar lymph nodes. Esophagus is unremarkable in appearance. No axillary lymphadenopathy. Lungs/Pleura: No suspicious-appearing pulmonary nodules or masses. No acute consolidative  airspace disease. No pleural effusions. Musculoskeletal/Soft Tissues: There are no aggressive appearing lytic or blastic lesions noted in the visualized portions of the skeleton. Multiple old healed right-sided rib fractures. CTA ABDOMEN AND PELVIS FINDINGS Hepatobiliary: No cystic or solid hepatic lesions. No intra or extrahepatic biliary ductal dilatation. Gallbladder is nearly decompressed, but otherwise unremarkable in appearance. Pancreas: No pancreatic mass. No pancreatic ductal dilatation. No pancreatic or peripancreatic fluid or inflammatory changes. Spleen: Unremarkable. Adrenals/Urinary Tract: In the medial aspect of the interpolar region of the right kidney there is an avidly enhancing mass which measures 2.6 x 3.7 x 3.4 cm (axial image 107 of series 5 and coronal image 86 of series 8), compatible with a solid renal neoplasm. This is separate from the right renal vein which is widely patent, and is completely encapsulated within Gerota's fascia. 2 cm exophytic simple cyst in the lower pole of the right kidney. 1.4 cm exophytic simple cyst in the interpolar region of the left kidney. No hydroureteronephrosis. Urinary bladder is normal in appearance. Bilateral adrenal glands are normal in appearance. Stomach/Bowel: Normal appearance of the stomach. No pathologic dilatation of small bowel or colon. A few scattered colonic diverticulae are noted, without surrounding inflammatory changes to suggest an acute diverticulitis at this time. Vascular/Lymphatic: Aortic atherosclerosis, with vascular findings and measurements pertinent to potential TAVR procedure, as detailed below. No aneurysm or dissection identified in the abdominal or pelvic vasculature. Celiac axis, superior mesenteric artery and inferior mesenteric artery are all widely patent without hemodynamically significant stenosis. Single renal artery is are also patent bilaterally. No lymphadenopathy noted in the abdomen or pelvis. Reproductive: Prostate  gland and seminal vesicles are unremarkable in appearance. Other: No significant volume of ascites.  No pneumoperitoneum. Musculoskeletal: Status post PLIF from L3-S1 with interbody grafts at L3-L4, L4-L5 and L5-S1. Osteopenia throughout the areas of spinal fixation in the lumbar spine. There are no aggressive appearing lytic or blastic lesions noted in the visualized portions of the skeleton. VASCULAR MEASUREMENTS PERTINENT TO TAVR: AORTA: Minimal Aortic Diameter -  17 x 14 mm Severity of Aortic Calcification -  moderate RIGHT PELVIS: Right Common Iliac Artery - Minimal Diameter - 10.6 x 10.4 mm Tortuosity - mild Calcification - mild Right External Iliac Artery - Minimal Diameter - 9.8 x 9.7 mm Tortuosity - severe Calcification - none Right Common Femoral Artery - Minimal Diameter - 10.6 x 8.6 mm Tortuosity - mild Calcification - mild LEFT PELVIS: Left Common Iliac Artery - Minimal Diameter - 12.0 x 10.7 mm Tortuosity - mild Calcification - mild Left External Iliac Artery -  Minimal Diameter - 9.2 x 9.9 mm Tortuosity - severe Calcification - none Left Common Femoral Artery - Minimal Diameter - 10.5 x 9.2 mm Tortuosity - mild Calcification - mild Review of the MIP images confirms the above findings. IMPRESSION: 1. Vascular findings and measurements pertinent to potential TAVR procedure, as detailed above. This patient does appear to have suitable pelvic arterial access bilaterally, particularly in terms of vascular size, however, please note the extreme tortuosity of the external iliac arteries bilaterally. 2. Severe thickening calcification of the aortic valve, compatible with the reported clinical history of severe aortic stenosis. 3. Concentric left ventricular hypertrophy. 4. Aortic atherosclerosis, in addition to left main and 3 vessel coronary artery disease. Assessment for potential risk factor modification, dietary therapy or pharmacologic therapy may be warranted, if clinically indicated. 5. Avidly enhancing  mass in the medial aspect of the interpolar region of the right kidney. In retrospect, there was likely a lesion of similar size on prior CT of the chest, abdomen and pelvis 12/21/2011, however, at the time of that examination the contrast bolus was so suboptimal that the lesion was not readily apparent. 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 This lesion is completely encapsulated within Gerota's fascia and does not involve the right renal vein at this time. Accordingly, follow-up CT of the abdomen with and without IV contrast is recommended in 6 months to reassess this lesion and ensure stability. 6. Additional incidental findings, as above. Aortic Atherosclerosis (ICD10-I70.0). Electronically Signed   By: Vinnie Langton M.D.   On: 03/08/2017 14:17   Ct Angio Abd/pel W/ And/or W/o  Result Date: 03/08/2017 CLINICAL DATA:  71 year old male with history of severe aortic stenosis. Preprocedural study prior to potential transcatheter aortic valve replacement (TAVR) procedure. EXAM: CT ANGIOGRAPHY CHEST, ABDOMEN AND PELVIS TECHNIQUE: Multidetector CT imaging through the chest, abdomen and pelvis was performed using the standard protocol during bolus administration of intravenous contrast. Multiplanar reconstructed images and MIPs were obtained and reviewed to evaluate the vascular anatomy. CONTRAST:  100 mL of Isovue 370. COMPARISON:  Cardiac CTA 02/02/2017. CT of the chest, abdomen and pelvis 12/21/2011. FINDINGS: CTA CHEST FINDINGS Cardiovascular: Heart size is borderline enlarged with concentric left ventricular hypertrophy. There is no significant pericardial fluid, thickening or pericardial calcification. There is aortic atherosclerosis, as well as atherosclerosis of the great vessels of the mediastinum and the coronary arteries, including calcified atherosclerotic plaque in the left main, left  anterior descending, left circumflex and right coronary arteries. Severe thickening calcification of the aortic valve. Mediastinum/Lymph Nodes: No pathologically enlarged mediastinal or hilar lymph nodes. Esophagus is unremarkable in appearance. No axillary lymphadenopathy. Lungs/Pleura: No suspicious-appearing pulmonary nodules or masses. No acute consolidative airspace disease. No pleural effusions. Musculoskeletal/Soft Tissues: There are no aggressive appearing lytic or blastic lesions noted in the visualized portions of the skeleton. Multiple old healed right-sided rib fractures. CTA ABDOMEN AND PELVIS FINDINGS Hepatobiliary: No cystic or solid hepatic lesions. No intra or extrahepatic biliary ductal dilatation. Gallbladder is nearly decompressed, but otherwise unremarkable in appearance. Pancreas: No pancreatic mass. No pancreatic ductal dilatation. No pancreatic or peripancreatic fluid or inflammatory changes. Spleen: Unremarkable. Adrenals/Urinary Tract: In the medial aspect of the interpolar region of the right kidney there is an avidly enhancing mass which measures 2.6 x 3.7 x 3.4 cm (axial image 107 of series 5 and coronal image 86 of series 8), compatible with a solid renal neoplasm.  This is separate from the right renal vein which is widely patent, and is completely encapsulated within Gerota's fascia. 2 cm exophytic simple cyst in the lower pole of the right kidney. 1.4 cm exophytic simple cyst in the interpolar region of the left kidney. No hydroureteronephrosis. Urinary bladder is normal in appearance. Bilateral adrenal glands are normal in appearance. Stomach/Bowel: Normal appearance of the stomach. No pathologic dilatation of small bowel or colon. A few scattered colonic diverticulae are noted, without surrounding inflammatory changes to suggest an acute diverticulitis at this time. Vascular/Lymphatic: Aortic atherosclerosis, with vascular findings and measurements pertinent to potential TAVR  procedure, as detailed below. No aneurysm or dissection identified in the abdominal or pelvic vasculature. Celiac axis, superior mesenteric artery and inferior mesenteric artery are all widely patent without hemodynamically significant stenosis. Single renal artery is are also patent bilaterally. No lymphadenopathy noted in the abdomen or pelvis. Reproductive: Prostate gland and seminal vesicles are unremarkable in appearance. Other: No significant volume of ascites.  No pneumoperitoneum. Musculoskeletal: Status post PLIF from L3-S1 with interbody grafts at L3-L4, L4-L5 and L5-S1. Osteopenia throughout the areas of spinal fixation in the lumbar spine. There are no aggressive appearing lytic or blastic lesions noted in the visualized portions of the skeleton. VASCULAR MEASUREMENTS PERTINENT TO TAVR: AORTA: Minimal Aortic Diameter -  17 x 14 mm Severity of Aortic Calcification -  moderate RIGHT PELVIS: Right Common Iliac Artery - Minimal Diameter - 10.6 x 10.4 mm Tortuosity - mild Calcification - mild Right External Iliac Artery - Minimal Diameter - 9.8 x 9.7 mm Tortuosity - severe Calcification - none Right Common Femoral Artery - Minimal Diameter - 10.6 x 8.6 mm Tortuosity - mild Calcification - mild LEFT PELVIS: Left Common Iliac Artery - Minimal Diameter - 12.0 x 10.7 mm Tortuosity - mild Calcification - mild Left External Iliac Artery - Minimal Diameter - 9.2 x 9.9 mm Tortuosity - severe Calcification - none Left Common Femoral Artery - Minimal Diameter - 10.5 x 9.2 mm Tortuosity - mild Calcification - mild Review of the MIP images confirms the above findings. IMPRESSION: 1. Vascular findings and measurements pertinent to potential TAVR procedure, as detailed above. This patient does appear to have suitable pelvic arterial access bilaterally, particularly in terms of vascular size, however, please note the extreme tortuosity of the external iliac arteries bilaterally. 2. Severe thickening calcification of the  aortic valve, compatible with the reported clinical history of severe aortic stenosis. 3. Concentric left ventricular hypertrophy. 4. Aortic atherosclerosis, in addition to left main and 3 vessel coronary artery disease. Assessment for potential risk factor modification, dietary therapy or pharmacologic therapy may be warranted, if clinically indicated. 5. Avidly enhancing mass in the medial aspect of the interpolar region of the right kidney. In retrospect, there was likely a lesion of similar size on prior CT of the chest, abdomen and pelvis 12/21/2011, however, at the time of that examination the contrast bolus was so suboptimal that the lesion was not readily apparent. 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 This lesion is completely encapsulated within Gerota's fascia and does not involve the right renal vein at this time. Accordingly, follow-up CT of the abdomen with and without IV contrast is recommended in 6 months to reassess this lesion and ensure stability. 6. Additional incidental findings, as above. Aortic Atherosclerosis (ICD10-I70.0). Electronically Signed   By: Vinnie Langton M.D.   On: 03/08/2017  14:17     Diagnostic Studies/Procedures    Echocardiogram 03/23/17: Study Conclusions - Left ventricle: The cavity size was normal. There was severe   concentric hypertrophy. Systolic function was vigorous. The   estimated ejection fraction was in the range of 65% to 70%. Wall   motion was normal; there were no regional wall motion   abnormalities. Features are consistent with a pseudonormal left   ventricular filling pattern, with concomitant abnormal relaxation   and increased filling pressure (grade 2 diastolic dysfunction). - Aortic valve: Valve area (VTI): 2.36 cm^2. Valve area (Vmax):   2.24 cm^2. Valve area (Vmean): 2.29 cm^2. - Left atrium: The atrium was mildly dilated. -  Right atrium: The atrium was mildly dilated. - Pulmonary arteries: Systolic pressure could not be accurately   estimated.   CORONARY FINDINGS 03/22/17: Right coronary artery: Large, dominant vessel. The stented segment in the mid vessel is widely patent with no evidence of restenosis. There are minor luminal irregularities but no significant stenoses in the remaining portions of the proximal, mid, or distal RCA. The PDA and PLA branches are patent.  Left Main: Widely patent vessel with mild calcification and no obstructive disease. Vessel divides into the LAD and left circumflex  LAD: The LAD reaches the left ventricular apex. The vessel is patent to the apex without high-grade stenosis. There is a 30-40% proximal vessel stenosis that is unchanged from the previous cath study. The diagonal branches are patent without significant disease.  Left circumflex: Circumflex is large caliber vessel. There are mild diffuse irregularities. The proximal/ostial circumflex has 30% stenosis. There are 3 OM branches without significant stenoses.    Echocardiogram pre-procedure 03/22/17: Study Conclusions - Left ventricle: The cavity size was normal. There was mild   concentric hypertrophy. Systolic function was normal. The   estimated ejection fraction was in the range of 55% to 60%. Wall   motion was normal; there were no regional wall motion   abnormalities. - Aortic valve: Functionally bicuspid; severely thickened, severely   calcified leaflets. There was severe stenosis. There was mild   regurgitation. Valve area (VTI): 1.38 cm^2. Valve area (Vmax):   1.37 cm^2. Valve area (Vmean): 1.23 cm^2. - Mitral valve: There was mild regurgitation. - Left atrium: The atrium was mildly dilated.  Impressions: - This was a periprocedural TTE during TAVR procedure.   Native aortic valve was severely thickened and calcified with   partially co-joined left and right coronary cusp.   Transaortic gradients were  underestimated by suboptimal imaging   angle with transthoracic acquisition. Peak/mean gradients 39/21   mmHg. Mild aortic regurgitation.   Mild mitral and trivial tricuspid regurgitation.     A 26 mm Edwards-SAPIEN 3 valve was successfully deployed in the   aortic position. Post deployment gradients were 12/5 mmHg. There   was mild paravalvular leak.   Mitral and tricuspid regurgitation remained the same.   There was no pericardial effusion prior or post procedure.    Disposition   Pt is being discharged home today in good condition.  Follow-up Plans & Appointments     Discharge Instructions    Amb Referral to Cardiac Rehabilitation    Complete by:  As directed    Diagnosis:  Valve Replacement   Valve:  Aortic Comment - TAVR   Diet - low sodium heart healthy    Complete by:  As directed    Discharge instructions    Complete by:  As directed    No driving for 2 days.  No lifting over 5 lbs for 1 week. No sexual activity for 1 week.  Keep procedure site clean & dry. If you notice increased pain, swelling, bleeding or pus, call/return!  You may shower, but no soaking baths/hot tubs/pools for 1 week.   Increase activity slowly    Complete by:  As directed       Discharge Medications   Current Discharge Medication List    CONTINUE these medications which have NOT CHANGED   Details  acetaminophen (TYLENOL) 500 MG tablet Take 1,000 mg by mouth daily as needed for moderate pain.     aspirin 81 MG tablet Take 81 mg by mouth daily.    calcium carbonate (TUMS - DOSED IN MG ELEMENTAL CALCIUM) 500 MG chewable tablet Chew 1 tablet by mouth daily.    clopidogrel (PLAVIX) 75 MG tablet Take 1 tablet (75 mg total) by mouth daily. Qty: 90 tablet, Refills: 3    desonide (DESOWEN) 0.05 % cream Apply 1 application topically daily as needed (psoriasis).     diphenhydrAMINE (BENADRYL) 2 % cream Apply 1 application topically daily as needed for itching.     diphenhydrAMINE (BENADRYL)  25 mg capsule Take 25 mg by mouth daily as needed for allergies.    HUMIRA PEN 40 MG/0.8ML PNKT Inject 40 mg into the skin every 28 (twenty-eight) days.     HYDROCORTISONE EX Apply 1 application topically daily as needed (itching).    levETIRAcetam (KEPPRA) 750 MG tablet Take 1 tablet (750 mg total) by mouth 2 (two) times daily. Qty: 180 tablet, Refills: 3   Associated Diagnoses: Localization-related idiopathic epilepsy and epileptic syndromes with seizures of localized onset, not intractable, without status epilepticus (HCC)    nitroGLYCERIN (NITROSTAT) 0.4 MG SL tablet Place 1 tablet (0.4 mg total) under the tongue every 5 (five) minutes as needed for chest pain (up to 3 doses). Qty: 25 tablet, Refills: 3    rosuvastatin (CRESTOR) 20 MG tablet Take 1 tablet (20 mg total) by mouth daily. Qty: 30 tablet, Refills: 11    clobetasol ointment (TEMOVATE) 0.76 % Apply 1 application topically daily as needed (skin irritation).  Refills: 1    esomeprazole (NEXIUM) 20 MG capsule Take 20 mg by mouth daily as needed.    PROCTOZONE-HC 2.5 % rectal cream Apply 1 application topically daily as needed for hemorrhoids or itching.            Outstanding Labs/Studies   Repeat echo (scheduled)  Duration of Discharge Encounter   Greater than 30 minutes including physician time.  Signed, Tami Lin Scotti Kosta PA-C 03/23/2017, 5:23 PM

## 2017-03-23 NOTE — Progress Notes (Signed)
Belongings sent with pt. Wife at bedside. Discharge instructions given with understanding. No questions at this time. Iv, monitor d/c. Pt wheeled out via wheelchair by staff.

## 2017-03-23 NOTE — Progress Notes (Signed)
Progress Note  Patient Name: Douglas Lane Date of Encounter: 03/23/2017  Primary Cardiologist: Radford Pax  Subjective   Feels well. Eager to go home. No chest pain or shortness of breath.  Inpatient Medications    Scheduled Meds: . aspirin  81 mg Oral Daily  . chlorhexidine  15 mL Mouth/Throat NOW  . clopidogrel  75 mg Oral Daily  . levETIRAcetam  750 mg Oral BID  . mouth rinse  15 mL Mouth Rinse BID  . metoprolol tartrate  12.5 mg Oral BID   Or  . metoprolol tartrate  12.5 mg Per Tube BID  . [START ON 03/24/2017] pantoprazole  40 mg Oral Daily  . rosuvastatin  20 mg Oral q1800  . sodium chloride flush  3 mL Intravenous Q12H   Continuous Infusions: . sodium chloride    . albumin human    . cefUROXime (ZINACEF)  IV Stopped (03/22/17 2024)  . famotidine (PEPCID) IV Stopped (03/22/17 2203)  . lactated ringers    . nitroGLYCERIN    . phenylephrine (NEO-SYNEPHRINE) Adult infusion     PRN Meds: sodium chloride, albumin human, diphenhydrAMINE, hydrocortisone, lactated ringers, metoprolol tartrate, midazolam, morphine injection, ondansetron (ZOFRAN) IV, oxyCODONE, sodium chloride flush, traMADol   Vital Signs    Vitals:   03/23/17 0400 03/23/17 0403 03/23/17 0500 03/23/17 0600  BP: (!) 100/49  (!) 88/55 (!) 93/46  Pulse: 69  (!) 54 (!) 59  Resp: 13  11 16   Temp:  98.4 F (36.9 C)    TempSrc:  Oral    SpO2: 96%  92% 90%  Weight:      Height:        Intake/Output Summary (Last 24 hours) at 03/23/17 0641 Last data filed at 03/23/17 0200  Gross per 24 hour  Intake          2545.47 ml  Output             1825 ml  Net           720.47 ml   Filed Weights   03/22/17 0547  Weight: 177 lb (80.3 kg)    Telemetry    NSR, no arrhythmia identified. No brady events or pauses. - Personally Reviewed  ECG    Sinus bradycardia with heart rate 57 bpm, marked LVH with repolarization abnormality. - Personally Reviewed  Physical Exam  Alert, oriented male in no distress GEN:  No acute distress.   Neck: No JVD Cardiac: RRR, grade 2/6 systolic ejection murmur at the right upper sternal border, no diastolic murmur Respiratory: Clear to auscultation bilaterally. GI: Soft, nontender, non-distended  MS: No edema; No deformity. Bilateral groin bruising is present with minimal tenderness. There is no firm hematoma. Neuro:  Nonfocal  Psych: Normal affect   Labs    Chemistry Recent Labs Lab 03/17/17 1159  03/22/17 0915 03/22/17 0954 03/22/17 1044 03/23/17 0402  NA 137  < > 140 136 138 137  K 4.4  < > 4.4 4.5 4.5 3.9  CL 107  < > 103 101  --  105  CO2 23  --   --   --   --  23  GLUCOSE 88  < > 132* 130* 115* 111*  BUN 14  < > 16 16  --  18  CREATININE 0.82  < > 0.70 0.70  --  0.92  CALCIUM 9.0  --   --   --   --  8.3*  PROT 6.1*  --   --   --   --   --  ALBUMIN 4.0  --   --   --   --   --   AST 28  --   --   --   --   --   ALT 19  --   --   --   --   --   ALKPHOS 68  --   --   --   --   --   BILITOT 1.6*  --   --   --   --   --   GFRNONAA >60  --   --   --   --  >60  GFRAA >60  --   --   --   --  >60  ANIONGAP 7  --   --   --   --  9  < > = values in this interval not displayed.   Hematology Recent Labs Lab 03/17/17 1159  03/22/17 0954 03/22/17 1044 03/23/17 0402  WBC 6.4  --   --  6.8 9.0  RBC 4.85  --   --  4.68 4.21*  HGB 14.5  < > 12.6* 13.6  13.6 12.2*  HCT 43.3  < > 37.0* 42.0  40.0 37.7*  MCV 89.3  --   --  89.7 89.5  MCH 29.9  --   --  29.1 29.0  MCHC 33.5  --   --  32.4 32.4  RDW 14.2  --   --  14.3 14.2  PLT 165  --   --  151 141*  < > = values in this interval not displayed.  Cardiac EnzymesNo results for input(s): TROPONINI in the last 168 hours. No results for input(s): TROPIPOC in the last 168 hours.   BNPNo results for input(s): BNP, PROBNP in the last 168 hours.   DDimer No results for input(s): DDIMER in the last 168 hours.   Radiology    Dg Chest Port 1 View  Result Date: 03/22/2017 CLINICAL DATA:  Status post  transcatheter aortic valve replacement. EXAM: PORTABLE CHEST 1 VIEW COMPARISON:  03/17/2017 FINDINGS: Evidence for a transcatheter aortic valve replacement. There is a right jugular catheter with the tip in the lower SVC region. Patchy densities in the right upper lung may be related to atelectasis. Negative for pneumothorax. Heart size is grossly stable. IMPRESSION: Patchy densities in right upper lung may be related to atelectasis. Recommend attention on follow-up. Central line tip in the lower SVC region. Negative for a pneumothorax. Electronically Signed   By: Markus Daft M.D.   On: 03/22/2017 11:41    Cardiac Studies   Postoperative day #1 echocardiogram is currently pending  Patient Profile     71 y.o. male with severe symptomatic aortic stenosis now status post TAVR postoperative day #1  Assessment & Plan    1. Severe symptomatic aortic stenosis: Patient is doing well postoperative day #1 from TAVR. He should continue dual antiplatelet therapy with aspirin and Plavix for 6 months then low-dose aspirin indefinitely. Await postoperative day #1 echo. Possible discharge later today.  2. Coronary artery disease, native vessel, without angina: Coronary angiogram performed yesterday shows stable coronary anatomy was continued patency of his RCA stent. Continue with medical management.  Deatra James, MD  03/23/2017, 6:41 AM

## 2017-03-23 NOTE — Progress Notes (Signed)
1 Day Post-Op Procedure(s) (LRB): TRANSCATHETER AORTIC VALVE REPLACEMENT, TRANSFEMORAL (N/A) TRANSESOPHAGEAL ECHOCARDIOGRAM (TEE) (N/A) Subjective: Did not sleep well but otherwise feels fine. Ambulated this am. No chest pain or shortness of breath.  Objective: Vital signs in last 24 hours: Temp:  [98.4 F (36.9 C)-99.1 F (37.3 C)] 98.4 F (36.9 C) (08/08 0403) Pulse Rate:  [45-242] 54 (08/08 0700) Cardiac Rhythm: Normal sinus rhythm (08/08 0400) Resp:  [7-17] 12 (08/08 0700) BP: (88-131)/(45-94) 113/45 (08/08 0700) SpO2:  [90 %-100 %] 95 % (08/08 0700) Arterial Line BP: (108-161)/(40-70) 135/42 (08/07 1730) Weight:  [82.4 kg (181 lb 10.5 oz)] 82.4 kg (181 lb 10.5 oz) (08/08 0700)  Hemodynamic parameters for last 24 hours:    Intake/Output from previous day: 08/07 0701 - 08/08 0700 In: 2545.5 [P.O.:480; I.V.:1965.5; IV Piggyback:100] Out: 1825 [Urine:1800; Blood:25] Intake/Output this shift: No intake/output data recorded.  General appearance: alert and cooperative Neurologic: intact Heart: regular rate and rhythm, S1, S2 normal, no murmur, click, rub or gallop Lungs: clear to auscultation bilaterally Abdomen: soft, non-tender; bowel sounds normal; no masses,  no organomegaly Extremities: extremities normal, atraumatic, no cyanosis or edema Wound: groin access sites look ok. some ecchymosis on right but no hematoma or bruit.  Pedal pulses palpable bilaterally. Lab Results:  Recent Labs  03/22/17 1044 03/23/17 0402  WBC 6.8 9.0  HGB 13.6  13.6 12.2*  HCT 42.0  40.0 37.7*  PLT 151 141*   BMET:  Recent Labs  03/22/17 0954 03/22/17 1044 03/23/17 0402  NA 136 138 137  K 4.5 4.5 3.9  CL 101  --  105  CO2  --   --  23  GLUCOSE 130* 115* 111*  BUN 16  --  18  CREATININE 0.70  --  0.92  CALCIUM  --   --  8.3*    PT/INR:  Recent Labs  03/22/17 1044  LABPROT 14.3  INR 1.10   ABG    Component Value Date/Time   PHART 7.342 (L) 03/22/2017 1049   HCO3  27.3 03/22/2017 1049   TCO2 29 03/22/2017 1049   ACIDBASEDEF 4.0 (H) 03/22/2017 0957   O2SAT 97.0 03/22/2017 1049   CBG (last 3)  No results for input(s): GLUCAP in the last 72 hours.  Assessment/Plan: S/P Procedure(s) (LRB): TRANSCATHETER AORTIC VALVE REPLACEMENT, TRANSFEMORAL (N/A) TRANSESOPHAGEAL ECHOCARDIOGRAM (TEE) (N/A)  POD 1 Hemodynamically stable in sinus rhythm. Resume Plavix and ASA as before for stent and TAVR valve. 2D echo today DC central line Continue ambulation Possibly home later today after echo.   LOS: 1 day    Douglas Lane 03/23/2017

## 2017-03-24 ENCOUNTER — Encounter (HOSPITAL_COMMUNITY): Payer: Self-pay | Admitting: Cardiovascular Disease

## 2017-03-24 MED FILL — Potassium Chloride Inj 2 mEq/ML: INTRAVENOUS | Qty: 40 | Status: AC

## 2017-03-24 MED FILL — Magnesium Sulfate Inj 50%: INTRAMUSCULAR | Qty: 10 | Status: AC

## 2017-03-24 MED FILL — Heparin Sodium (Porcine) Inj 1000 Unit/ML: INTRAMUSCULAR | Qty: 30 | Status: AC

## 2017-03-25 ENCOUNTER — Encounter (HOSPITAL_COMMUNITY): Payer: Self-pay | Admitting: Cardiovascular Disease

## 2017-03-25 NOTE — Anesthesia Postprocedure Evaluation (Signed)
Anesthesia Post Note  Patient: Douglas Lane  Procedure(s) Performed: Procedure(s) (LRB): TRANSCATHETER AORTIC VALVE REPLACEMENT, TRANSFEMORAL (N/A) TRANSESOPHAGEAL ECHOCARDIOGRAM (TEE) (N/A)     Patient location during evaluation: ICU Anesthesia Type: MAC Level of consciousness: awake and alert Pain management: pain level controlled Vital Signs Assessment: post-procedure vital signs reviewed and stable Respiratory status: spontaneous breathing, nonlabored ventilation, respiratory function stable and patient connected to nasal cannula oxygen Cardiovascular status: stable and blood pressure returned to baseline Postop Assessment: no signs of nausea or vomiting Anesthetic complications: no    Last Vitals:  Vitals:   03/23/17 1600 03/23/17 1700  BP: (!) 107/51 (!) 128/59  Pulse: 60 61  Resp: 18 15  Temp:    SpO2: 93% 92%    Last Pain:  Vitals:   03/23/17 1600  TempSrc:   PainSc: 0-No pain                 Tranika Scholler

## 2017-04-01 ENCOUNTER — Telehealth (HOSPITAL_COMMUNITY): Payer: Self-pay

## 2017-04-01 NOTE — Telephone Encounter (Signed)
Patient insurance is active and benefits verified. Patient insurance is [facility] Medicare - $20.00 co-payment, no deductible, out of pocket $4000/$816.89, no co-insurance and no-preauthorization. Passport/reference # [id number].  Patient will be contacted and scheduled after their follow up appointment with the cardiologist on 04/14/17, upon review by [facility] RN navigator.

## 2017-04-14 ENCOUNTER — Ambulatory Visit (INDEPENDENT_AMBULATORY_CARE_PROVIDER_SITE_OTHER): Payer: Medicare Other | Admitting: Physician Assistant

## 2017-04-14 ENCOUNTER — Encounter: Payer: Self-pay | Admitting: Physician Assistant

## 2017-04-14 ENCOUNTER — Ambulatory Visit: Payer: Self-pay | Admitting: Cardiovascular Disease

## 2017-04-14 ENCOUNTER — Other Ambulatory Visit: Payer: Self-pay

## 2017-04-14 ENCOUNTER — Ambulatory Visit (HOSPITAL_COMMUNITY): Payer: Medicare Other | Attending: Cardiovascular Disease

## 2017-04-14 VITALS — BP 122/70 | HR 68 | Ht 66.0 in | Wt 177.8 lb

## 2017-04-14 DIAGNOSIS — E785 Hyperlipidemia, unspecified: Secondary | ICD-10-CM

## 2017-04-14 DIAGNOSIS — Z952 Presence of prosthetic heart valve: Secondary | ICD-10-CM

## 2017-04-14 DIAGNOSIS — Z953 Presence of xenogenic heart valve: Secondary | ICD-10-CM | POA: Diagnosis not present

## 2017-04-14 DIAGNOSIS — I517 Cardiomegaly: Secondary | ICD-10-CM | POA: Insufficient documentation

## 2017-04-14 DIAGNOSIS — I509 Heart failure, unspecified: Secondary | ICD-10-CM | POA: Insufficient documentation

## 2017-04-14 DIAGNOSIS — I5189 Other ill-defined heart diseases: Secondary | ICD-10-CM | POA: Diagnosis not present

## 2017-04-14 DIAGNOSIS — I2510 Atherosclerotic heart disease of native coronary artery without angina pectoris: Secondary | ICD-10-CM | POA: Diagnosis not present

## 2017-04-14 DIAGNOSIS — I350 Nonrheumatic aortic (valve) stenosis: Secondary | ICD-10-CM

## 2017-04-14 MED ORDER — AMOXICILLIN 500 MG PO CAPS: 500.0000 mg | ORAL_CAPSULE | ORAL | 1 refills | Status: DC | PRN

## 2017-04-14 NOTE — Patient Instructions (Addendum)
Medication Instructions:  Your physician has recommended you make the following change in your medication: 1.) STOP your Plavix on February 7th. 2.) START taking Amoxicillin only before your dental procedures.    Labwork: None Ordered   Testing/Procedures: Your physician has requested that you have an echocardiogram in one year. Echocardiography is a painless test that uses sound waves to create images of your heart. It provides your doctor with information about the size and shape of your heart and how well your heart's chambers and valves are working. This procedure takes approximately one hour. There are no restrictions for this procedure.     Follow-Up:   Any Other Special Instructions Will Be Listed Below (If Applicable).     If you need a refill on your cardiac medications before your next appointment, please call your pharmacy.

## 2017-04-14 NOTE — Progress Notes (Addendum)
Cardiology Office Note    Date:  04/14/2017   ID:  Douglas, Lane 11-14-1945, MRN [id number]  PCP:  Lawerance Cruel, MD  Cardiologist:  Dr. Radford Pax   CC: 1 month follow up s/p TAVR  History of Present Illness:  Douglas Lane is a 71 y.o. male with a history of HLD, CAD s/p DES to RCA (03/2016), and severe AS s/p TAVR (03/22/17) who presents to clinic for follow up.  Prior to his cath and PCI he presented with exertional chest discomfort and shortness of breath. An echo in 02/2015 has shown moderate AS with a mean gradient of 31 mm Hg. He had a gated nuclear stress test on 03/19/2016 which was an intermediate risk study with an inferior infarct and no reversible ischemia prompting the cath and subsequent PCI. He also had 40% diffuse proximal LAD narrowing and 50% ostial PDA stenosis. The mean AV gradient was 41.3 mm Hg with an AVA of 0.85. The valve was easy to cross so there was some concern about the gradient being in the subvalvular region. His 2D echo on 03/19/2016 had shown a mean gradient of 34 mm Hg with an LVOT peak gradient of only 3 mm Hg.  He had a repeat 2D echo on 10/21/2016 showing a mean gradient of 34 mm Hg and a DI of 0.22. The AV is thickened and calcified with restricted motion. A TEE was performed and this showed severe AS with a mean gradient of 53 mm Hg and a peak of 92 mm Hg. There was mild AI. The LVOT peak gradient was only 4 mm Hg. There was no subvalvular obstruction. The LVEF was normal at 55-60%.  He was seen by the multidisciplinary valve teams and felt to be a suitable candidate for TAVR which was successfully completed on 03/22/17 with a 22mm Edwards Sapien 3 THV via the TF approach. Lopressor was held at discharge due to bradycardia.  Today he presents for follow up. [facility] and [facility]. He has been doing 15 minutes on the treadmill and some light weight training. No chest pain or SOB. No LE edema, orthopnea or PND. No dizziness or  syncope. No blood in stool or urine. No palpitations.   Past Medical History:  Diagnosis Date  . Aortic stenosis    a. moderate by echo 03/2016; based on cath 03/2016 it is suspected that much of his gradient is subvalvular.  . Arthritis    "pain in my knees" (03/30/2016)  . CAD S/P percutaneous coronary angioplasty 03/31/2016   a. CP/USA -> LHC 03/31/16: 90% prox-mid RCA s/p DES, 40% prox-mid LAD, 25% D2, 25% prox-mid Cx, elevated LVEDP 33.  . Chronic diastolic CHF (congestive heart failure) ([place]) 03/31/2016  . Chronic lower back pain   . Dyslipidemia   . Dyspnea    with exertion  . Heart murmur    "since I was a kid" (03/30/2016)  . LVH (left ventricular hypertrophy)    a. severe basal septal hypertrophy by echo 03/2016.  . Mitral regurgitation    a. mild by echo 03/2016.  Marland Kitchen Psoriasis   . Rheumatic fever    as a child  . Seizure ([place]) 01/2011; 07/2011; 07/2015      "idiopathic; been on Kepra since 07/2011"  . Sinus bradycardia - baseline HR 50s at times.   . Sleep apnea   . 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 Thrombosed hemorrhoids  Past Surgical History:  Procedure Laterality Date  . APPENDECTOMY    . BACK SURGERY    . CARDIAC CATHETERIZATION N/A 03/30/2016   Procedure: Right/Left Heart Cath and Coronary Angiography;  Surgeon: Jettie Booze, MD;  Location: [facility];  Service: Cardiovascular;  Laterality: N/A;  . CARDIAC CATHETERIZATION N/A 03/30/2016   Procedure: Coronary Stent Intervention;  Surgeon: Jettie Booze, MD;  Location: [facility];  Service: Cardiovascular;  Laterality: N/A;  . COLONOSCOPY    . CORONARY ANGIOPLASTY    . FOREARM FRACTURE SURGERY Right ~ 2003-2004   "got steel plate in there"  . I&D hemorrhoirds    . KNEE ARTHROSCOPY Bilateral   . MOHS SURGERY Left    temple  . ORIF SCAPULAR FRACTURE Right    right  . POSTERIOR LUMBAR FUSION  2004  . TEE WITHOUT CARDIOVERSION N/A 11/01/2016   Procedure: TRANSESOPHAGEAL  ECHOCARDIOGRAM (TEE);  Surgeon: Sanda Klein, MD;  Location: [facility];  Service: Cardiovascular;  Laterality: N/A;  . TEE WITHOUT CARDIOVERSION N/A 03/22/2017   Procedure: TRANSESOPHAGEAL ECHOCARDIOGRAM (TEE);  Surgeon: Sherren Mocha, MD;  Location[place] [place];  Service: Open Heart Surgery;  Laterality: N/A;  . TONSILLECTOMY    . TRANSCATHETER AORTIC VALVE REPLACEMENT, TRANSFEMORAL N/A 03/22/2017   Procedure: TRANSCATHETER AORTIC VALVE REPLACEMENT, TRANSFEMORAL;  Surgeon: Sherren Mocha, MD;  Location: [place];  Service: Open Heart Surgery;  Laterality: N/A;    Current Medications: Outpatient Medications Prior to Visit  Medication Sig Dispense Refill  . acetaminophen (TYLENOL) 500 MG tablet Take 1,000 mg by mouth daily as needed for moderate pain.     Marland Kitchen aspirin 81 MG tablet Take 81 mg by mouth daily.    . calcium carbonate (TUMS - DOSED IN MG ELEMENTAL CALCIUM) 500 MG chewable tablet Chew 1 tablet by mouth daily.    . clobetasol ointment (TEMOVATE) 6.72 % Apply 1 application topically daily as needed (skin irritation).   1  . clopidogrel (PLAVIX) 75 MG tablet Take 1 tablet (75 mg total) by mouth daily. 90 tablet 3  . desonide (DESOWEN) 0.05 % cream Apply 1 application topically daily as needed (psoriasis).     . diphenhydrAMINE (BENADRYL) 2 % cream Apply 1 application topically daily as needed for itching.     . diphenhydrAMINE (BENADRYL) 25 mg capsule Take 25 mg by mouth daily as needed for allergies.    Marland Kitchen esomeprazole (NEXIUM) 20 MG capsule Take 20 mg by mouth daily as needed.    Marland Kitchen HUMIRA PEN 40 MG/0.8ML PNKT Inject 40 mg into the skin every 28 (twenty-eight) days.     Marland Kitchen HYDROCORTISONE EX Apply 1 application topically daily as needed (itching).    Marland Kitchen levETIRAcetam (KEPPRA) 750 MG tablet Take 1 tablet (750 mg total) by mouth 2 (two) times daily. 180 tablet 3  . nitroGLYCERIN (NITROSTAT) 0.4 MG SL tablet Place 1 tablet (0.4 mg total) under the tongue every 5 (five) minutes as needed for chest  pain (up to 3 doses). 25 tablet 3  . PROCTOZONE-HC 2.5 % rectal cream Apply 1 application topically daily as needed for hemorrhoids or itching.     . rosuvastatin (CRESTOR) 20 MG tablet Take 1 tablet (20 mg total) by mouth daily. 30 tablet 11   No facility-administered medications prior to visit.      Allergies:   No known allergies   Social History   Social History  . Marital status: Married    Spouse name: N/A  . Number of children:  N/A  . Years of education: N/A   Social History Main Topics  . Smoking status: Never Smoker  . Smokeless tobacco: Never Used  . Alcohol use 8.4 oz/week    7 Shots of liquor, 7 Cans of beer per week  . Drug use: No  . Sexual activity: Not Currently   Other Topics Concern  . None   Social History Narrative  . None     Family History:  The patient's family history includes Congestive Heart Failure in his father; Depression in his sister; Renal Disease in his mother.      ROS:   Please see the history of present illness.    ROS All other systems reviewed and are negative.   PHYSICAL EXAM:   VS:  BP 122/70   Pulse 68   Ht 5\' 6"  (1.676 m)   Wt 177 lb 12.8 oz (80.6 kg)   SpO2 96%   BMI 28.70 kg/m    GEN: Well nourished, well developed, in no acute distress  HEENT: normal  Neck: no JVD, carotid bruits, or masses Cardiac: RRR; 2/6 SEM @ RUSB, no rubs, or gallops,no edema  Respiratory:  clear to auscultation bilaterally, normal work of breathing GI: soft, nontender, nondistended, + BS MS: no deformity or atrophy  Skin: warm and dry, no rash Neuro:  Alert and Oriented x 3, Strength and sensation are intact Psych: euthymic mood, full affect   Wt Readings from Last 3 Encounters:  04/14/17 177 lb 12.8 oz (80.6 kg)  03/23/17 181 lb 10.5 oz (82.4 kg)  03/17/17 177 lb (80.3 kg)      Studies/Labs Reviewed:   EKG:  EKG is ordered today.  The ekg ordered today demonstrates NSR with LVH and repol abnormality, similar to previous    Recent Labs: 03/17/2017: ALT 19 03/23/2017: BUN 18; Creatinine, Ser 0.92; Hemoglobin 12.2; Magnesium 1.8; Platelets 141; Potassium 3.9; Sodium 137   Lipid Panel    Component Value Date/Time   CHOL 130 08/06/2016 0831   TRIG 82 08/06/2016 0831   HDL 50 08/06/2016 0831   CHOLHDL 2.6 08/06/2016 0831   VLDL 16 08/06/2016 0831   LDLCALC 64 08/06/2016 0831    Additional studies/ records that were reviewed today include:  2D ECHO: 04/14/2017 Study Conclusions - Left ventricle: The cavity size was normal. Wall thickness was   increased in a pattern of mild LVH. Systolic function was normal.   The estimated ejection fraction was in the range of 55% to 60%.   Wall motion was normal; there were no regional wall motion   abnormalities. Features are consistent with a pseudonormal left   ventricular filling pattern, with concomitant abnormal relaxation   and increased filling pressure (grade 2 diastolic dysfunction). - Aortic valve: A bioprosthesis was present. There was trivial   regurgitation. Valve area (VTI): 2.05 cm^2. Valve area (Vmax):   1.88 cm^2. Valve area (Vmean): 2.15 cm^2. - Left atrium: The atrium was mildly dilated. - Right ventricle: The cavity size was mildly dilated. Impressions - Normal LV systolic function; mild LVH; moderate diastolic   dysfunction; s/p TAVR with mean gradient of 14 mmHg and trace AI;   mild LAE and RVE.    ASSESSMENT & PLAN:   Severe AS s/p TAVR: 1 month post operative echo looks good with a mean gradient of 14 mmHg and trace AI. ECG today with no bradyarrhythmias. He is doing quite well and working out at [employer] with no issues. Will stop plavix in 6 months (  middle of 09/2017). Information and Rx given for SBE prophylaxis. We will see him back in 1 year with an echo and follow up.   CAD: no angina. Continue ASA, plavix, statin. BB discontinued after TAVR given sinus brady.   HLD: continue statin.   Medication Adjustments/Labs and Tests  Ordered: Current medicines are reviewed at length with the patient today.  Concerns regarding medicines are outlined above.  Medication changes, Labs and Tests ordered today are listed in the Patient Instructions below. Patient Instructions  Medication Instructions:  Your physician has recommended you make the following change in your medication: 1.) STOP your Plavix on February 7th. 2.) START taking Amoxicillin only before your dental procedures.    Labwork: None Ordered   Testing/Procedures: Your physician has requested that you have an echocardiogram in one year. Echocardiography is a painless test that uses sound waves to create images of your heart. It provides your doctor with information about the size and shape of your heart and how well your heart's chambers and valves are working. This procedure takes approximately one hour. There are no restrictions for this procedure.     Follow-Up:   Any Other Special Instructions Will Be Listed Below (If Applicable).     If you need a refill on your cardiac medications before your next appointment, please call your pharmacy.      Signed, Angelena Form, PA-C  04/14/2017 4:48 PM    [facility] [place], [place], [place]  [address] Phone: [phone]; Fax: [phone]

## 2017-04-15 ENCOUNTER — Encounter: Payer: Self-pay | Admitting: Thoracic Surgery (Cardiothoracic Vascular Surgery)

## 2017-04-21 ENCOUNTER — Encounter: Payer: Self-pay | Admitting: Cardiology

## 2017-04-21 ENCOUNTER — Ambulatory Visit (INDEPENDENT_AMBULATORY_CARE_PROVIDER_SITE_OTHER): Payer: Medicare Other | Admitting: Cardiology

## 2017-04-21 VITALS — BP 134/69 | HR 82 | Ht 66.0 in | Wt 179.4 lb

## 2017-04-21 DIAGNOSIS — I350 Nonrheumatic aortic (valve) stenosis: Secondary | ICD-10-CM

## 2017-04-21 DIAGNOSIS — I340 Nonrheumatic mitral (valve) insufficiency: Secondary | ICD-10-CM | POA: Diagnosis not present

## 2017-04-21 DIAGNOSIS — I25119 Atherosclerotic heart disease of native coronary artery with unspecified angina pectoris: Secondary | ICD-10-CM | POA: Diagnosis not present

## 2017-04-21 DIAGNOSIS — E785 Hyperlipidemia, unspecified: Secondary | ICD-10-CM

## 2017-04-21 NOTE — Patient Instructions (Signed)
Medication Instructions:  1) STOP PLAVIX September 22, 2017.   Labwork: None  Testing/Procedures: None  Follow-Up: Your provider wants you to follow-up in: 6 months with Dr. Radford Pax. You will receive a reminder letter in the mail two months in advance. If you don't receive a letter, please call our office to schedule the follow-up appointment.    Any Other Special Instructions Will Be Listed Below (If Applicable).     If you need a refill on your cardiac medications before your next appointment, please call your pharmacy.

## 2017-04-21 NOTE — Progress Notes (Signed)
Cardiology Office Note:    Date:  04/21/2017   ID:  Douglas Lane, DOB 06-30-1946, MRN [id number]  PCP:  Lawerance Cruel, MD  Cardiologist:  Fransico Him, MD   Referring MD: Lawerance Cruel, MD   Chief Complaint  Patient presents with  . Coronary Artery Disease  . Aortic Stenosis  . Hyperlipidemia    History of Present Illness:    Douglas Lane is a 71 y.o. male with a hx of HLD, CAD s/p DES to RCA (03/2016) and 40% prox LAD and 50% ostial PDA, HTN, chronic diastolic CHFand severe AS s/p TAVR (03/22/17) with a 62mm Edwards Sapien 3 THV via the TF approach. He was seen back in clinic 2 weeks ago and was doing well.  Repeat echo post TAVR showed normal LVF with G2DD with stable TAVR with AVA 2cm2, mean AVG 63mmHg and peak AVG 61mmHg.    He is here today for followup and is doing well.  He says that he noticed an improvement in how he felt almost immediately.  He denies any chest pain or pressure, SOB, DOE, PND, orthopnea, LE edema, dizziness, palpitations or syncope. He has started working at [employer], walking and doing yard work.  Past Medical History:  Diagnosis Date  . Aortic stenosis    severe AS s/p TAVR (03/22/17) with a 64mm Edwards Sapien 3 THV via the TF approach  . Arthritis    "pain in my knees" (03/30/2016)  . CAD S/P percutaneous coronary angioplasty 03/31/2016   a. CP/USA -> LHC 03/31/16: 90% prox-mid RCA s/p DES, 40% prox-mid LAD, 25% D2, 25% prox-mid Cx, elevated LVEDP 33.  . Chronic diastolic CHF (congestive heart failure) ([place]) 03/31/2016  . Chronic lower back pain   . Dyslipidemia   . LVH (left ventricular hypertrophy)    a. severe basal septal hypertrophy by echo 03/2016.  . Mitral regurgitation    a. mild by echo 03/2016.  Marland Kitchen Psoriasis   . Rheumatic fever    as a child  . Seizure ([place]) 01/2011; 07/2011; 07/2015      "idiopathic; been on Kepra since 07/2011"  . Sinus bradycardia - baseline HR 50s at times.   . Sleep apnea   . 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 Thrombosed hemorrhoids     Past Surgical History:  Procedure Laterality Date  . APPENDECTOMY    . BACK SURGERY    . CARDIAC CATHETERIZATION N/A 03/30/2016   Procedure: Right/Left Heart Cath and Coronary Angiography;  Surgeon: Jettie Booze, MD;  Location: [facility];  Service: Cardiovascular;  Laterality: N/A;  . CARDIAC CATHETERIZATION N/A 03/30/2016   Procedure: Coronary Stent Intervention;  Surgeon: Jettie Booze, MD;  Location: [facility];  Service: Cardiovascular;  Laterality: N/A;  . COLONOSCOPY    . CORONARY ANGIOPLASTY    . FOREARM FRACTURE SURGERY Right ~ 2003-2004   "got steel plate in there"  . I&D hemorrhoirds    . KNEE ARTHROSCOPY Bilateral   . MOHS SURGERY Left    temple  . ORIF SCAPULAR FRACTURE Right    right  . POSTERIOR LUMBAR FUSION  2004  . TEE WITHOUT CARDIOVERSION N/A 11/01/2016   Procedure: TRANSESOPHAGEAL ECHOCARDIOGRAM (TEE);  Surgeon: Sanda Klein, MD;  Location: [facility];  Service: Cardiovascular;  Laterality: N/A;  . TEE WITHOUT CARDIOVERSION N/A 03/22/2017   Procedure: TRANSESOPHAGEAL ECHOCARDIOGRAM (TEE);  Surgeon: Sherren Mocha, MD;  Location: [facility];  Service: Open Heart  Surgery;  Laterality: N/A;  . TONSILLECTOMY    . TRANSCATHETER AORTIC VALVE REPLACEMENT, TRANSFEMORAL N/A 03/22/2017   Procedure: TRANSCATHETER AORTIC VALVE REPLACEMENT, TRANSFEMORAL;  Surgeon: Sherren Mocha, MD;  Location: [facility];  Service: Open Heart Surgery;  Laterality: N/A;    Current Medications: Current Meds  Medication Sig  . acetaminophen (TYLENOL) 500 MG tablet Take 1,000 mg by mouth daily as needed for moderate pain.   Marland Kitchen amoxicillin (AMOXIL) 500 MG capsule Take 1 capsule (500 mg total) by mouth as needed. Please take four pills before dental procedures.  Marland Kitchen aspirin 81 MG tablet Take 81 mg by mouth daily.  . calcium carbonate (TUMS - DOSED IN MG ELEMENTAL CALCIUM) 500 MG chewable tablet Chew 1 tablet by mouth daily.  .  clobetasol ointment (TEMOVATE) 1.06 % Apply 1 application topically daily as needed (skin irritation).   . clopidogrel (PLAVIX) 75 MG tablet Take 1 tablet (75 mg total) by mouth daily.  Marland Kitchen desonide (DESOWEN) 0.05 % cream Apply 1 application topically daily as needed (psoriasis).   . diphenhydrAMINE (BENADRYL) 2 % cream Apply 1 application topically daily as needed for itching.   . diphenhydrAMINE (BENADRYL) 25 mg capsule Take 25 mg by mouth daily as needed for allergies.  Marland Kitchen esomeprazole (NEXIUM) 20 MG capsule Take 20 mg by mouth daily as needed.  Marland Kitchen HUMIRA PEN 40 MG/0.8ML PNKT Inject 40 mg into the skin every 28 (twenty-eight) days.   Marland Kitchen HYDROCORTISONE EX Apply 1 application topically daily as needed (itching).  Marland Kitchen levETIRAcetam (KEPPRA) 750 MG tablet Take 1 tablet (750 mg total) by mouth 2 (two) times daily.  . nitroGLYCERIN (NITROSTAT) 0.4 MG SL tablet Place 1 tablet (0.4 mg total) under the tongue every 5 (five) minutes as needed for chest pain (up to 3 doses).  Marland Kitchen PROCTOZONE-HC 2.5 % rectal cream Apply 1 application topically daily as needed for hemorrhoids or itching.   . rosuvastatin (CRESTOR) 20 MG tablet Take 1 tablet (20 mg total) by mouth daily.     Allergies:   No known allergies   Social History   Social History  . Marital status: Married    Spouse name: N/A  . Number of children: N/A  . Years of education: N/A   Social History Main Topics  . Smoking status: Never Smoker  . Smokeless tobacco: Never Used  . Alcohol use 8.4 oz/week    7 Shots of liquor, 7 Cans of beer per week  . Drug use: No  . Sexual activity: Not Currently   Other Topics Concern  . None   Social History Narrative  . None     Family History: The patient's family history includes Congestive Heart Failure in his father; Depression in his sister; Renal Disease in his mother.  ROS:   Please see the history of present illness.     All other systems reviewed and are negative.  EKGs/Labs/Other Studies  Reviewed:    The following studies were reviewed today: 2D echo  EKG:  EKG is not ordered today.    Recent Labs: 03/17/2017: ALT 19 03/23/2017: BUN 18; Creatinine, Ser 0.92; Hemoglobin 12.2; Magnesium 1.8; Platelets 141; Potassium 3.9; Sodium 137   Recent Lipid Panel    Component Value Date/Time   CHOL 130 08/06/2016 0831   TRIG 82 08/06/2016 0831   HDL 50 08/06/2016 0831   CHOLHDL 2.6 08/06/2016 0831   VLDL 16 08/06/2016 0831   LDLCALC 64 08/06/2016 0831    Physical Exam:    VS:  BP 134/69   Pulse 82   Ht 5\' 6"  (1.676 m)   Wt 179 lb 6.4 oz (81.4 kg)   BMI 28.96 kg/m     Wt Readings from Last 3 Encounters:  04/21/17 179 lb 6.4 oz (81.4 kg)  04/14/17 177 lb 12.8 oz (80.6 kg)  03/23/17 181 lb 10.5 oz (82.4 kg)     GEN:  Well nourished, well developed in no acute distress HEENT: Normal NECK: No JVD; No carotid bruits LYMPHATICS: No lymphadenopathy CARDIAC: RRR, no murmurs, rubs, gallops RESPIRATORY:  Clear to auscultation without rales, wheezing or rhonchi  ABDOMEN: Soft, non-tender, non-distended MUSCULOSKELETAL:  No edema; No deformity  SKIN: Warm and dry NEUROLOGIC:  Alert and oriented x 3 PSYCHIATRIC:  Normal affect   ASSESSMENT:    1. Coronary artery disease involving native coronary artery of native heart with angina pectoris ([place])   2. Severe aortic stenosis   3. Non-rheumatic mitral regurgitation   4. Dyslipidemia    PLAN:    In order of problems listed above:  1. ASCAD - s/p DES to RCA (03/2016) and 40% prox LAD and 50% ostial PDA.  He will continue on ASA and statin.    2. Severe AS s/p TAVR (03/22/17) with a 63mm Edwards Sapien 3 THV via the TF approach.  He will continue on Plavix until 09/22/2016 and then he can stop it.  I also reminded him to use SBE prophylaxis for dental procedures.   3. Mild MR by echo 03/2017 with apical murmur on exam  4. Hyperlipidemia with LDL goal < 70.  He will continue on crestor 20mg  daily.  I will get an FLP and ALT  result from PCP.     Medication Adjustments/Labs and Tests Ordered: Current medicines are reviewed at length with the patient today.  Concerns regarding medicines are outlined above.  No orders of the defined types were placed in this encounter.  No orders of the defined types were placed in this encounter.   Signed, Fransico Him, MD  04/21/2017 9:54 AM    [facility]

## 2017-04-22 ENCOUNTER — Telehealth (HOSPITAL_COMMUNITY): Payer: Self-pay

## 2017-04-22 NOTE — Telephone Encounter (Signed)
I called patient to discuss scheduling for cardiac rehab. Patient stated that he had spoke with Dr. Burt Knack and at this time it was not necessary for him to participate in cardiac rehab. Patient stated that Dr. Burt Knack said he was doing well and patient is currently exercising on his own at this time. Patient has previously done this program back in 2017. Referral closed.

## 2017-05-10 NOTE — Telephone Encounter (Signed)
Error

## 2017-06-22 ENCOUNTER — Encounter: Payer: Self-pay | Admitting: Cardiology

## 2017-06-22 ENCOUNTER — Other Ambulatory Visit: Payer: Self-pay | Admitting: Cardiology

## 2017-06-22 NOTE — Telephone Encounter (Signed)
Medication Detail    Disp Refills Start End   rosuvastatin (CRESTOR) 20 MG tablet 30 tablet 9 06/22/2017    Sig: TAKE 1 TABLET(20 MG) BY MOUTH DAILY   Sent to pharmacy as: rosuvastatin (CRESTOR) 20 MG tablet   E-Prescribing Status: Receipt confirmed by pharmacy (06/22/2017 4:29 PM EST)

## 2017-06-25 ENCOUNTER — Encounter: Payer: Self-pay | Admitting: Neurology

## 2017-09-05 ENCOUNTER — Ambulatory Visit: Payer: Medicare Other | Admitting: Neurology

## 2017-09-05 ENCOUNTER — Encounter: Payer: Self-pay | Admitting: Neurology

## 2017-09-05 VITALS — BP 142/68 | HR 75 | Ht 67.0 in | Wt 185.0 lb

## 2017-09-05 DIAGNOSIS — F321 Major depressive disorder, single episode, moderate: Secondary | ICD-10-CM

## 2017-09-05 DIAGNOSIS — G40009 Localization-related (focal) (partial) idiopathic epilepsy and epileptic syndromes with seizures of localized onset, not intractable, without status epilepticus: Secondary | ICD-10-CM | POA: Diagnosis not present

## 2017-09-05 MED ORDER — NORTRIPTYLINE HCL 10 MG PO CAPS: 10.0000 mg | ORAL_CAPSULE | Freq: Every day | ORAL | 6 refills | Status: DC

## 2017-09-05 MED ORDER — LEVETIRACETAM 750 MG PO TABS: 750.0000 mg | ORAL_TABLET | Freq: Two times a day (BID) | ORAL | 3 refills | Status: DC

## 2017-09-05 NOTE — Progress Notes (Signed)
NEUROLOGY FOLLOW UP OFFICE NOTE  Douglas Lane [id number]  DOB: [birth year]  HISTORY OF PRESENT ILLNESS: I had the pleasure of seeing Douglas Lane in follow-up in the neurology clinic on 09/05/2017.  The patient was last seen 6 months ago for recurrent seizures. He had been seizure-free for a year off Keppra, and had tapered of clorazepate which he was taking for anxiety. He had a generalized convulsion on 07/31/14, 3 days after stopping clorazepate. He was restarted on Keppra 500mg  BID. His wife had also reported that after stopping the Keppra, he had prolonged episodes of memory lapses where he could not recall the last few hours. He had called our office to report an episode of memory lapse on 02/22/16. He was at church, he recalls standing at the end of service, then next thing he recalls is being in the car on the way home. He called his wife and told her where he was, but has no recollection of previous 30-83mins. She told him that he sounded confused, which he did because he had planned to make stops but he did not do so. He denied any associated headache, focal numbness/tingling/weakness, dizziness. There was a cup of coffee in the car that he probably got on his way out, but did not recall getting this. He denied any missed medications, sleep deprivation, infection. There had been plenty of stress, related to wife's parents and travelling. Dose of Keppra was increased to 750mg  BID.   Since his last visit, he denies any further seizures or seizure-like symptoms since July 2017. No further episodes lof loss of time. He appears tired today, and again reports continued stress from family issues. His father-in-law recently passed away. He has not been sleeping well. He denies any headaches, dizziness, vision changes, focal numbness/tingling/weakness. He tripped over the door jam a couple of times, no injuries. He underwent TAVR last August 2018 and feels very well from a cardiac standpoint.  HPI  08/20/2014: This is a 72 yo RH man with a history of 2 seizures in 2012 that occurred 6 months apart, presenting after ER visit for a seizure last 08/03/14. The first seizure occurred in June 2012. He recalls it was a stressful period just soon after he retired. He had a nocturnal convulsion and was brought to [facility]. He was not started on medication initially as it was his first seizure. On December 2012, he had another nocturnal convulsion and was started on Keppra 500mg  BID with no further seizures for 3 years. He and his neurologist in [facility] agreed to start weaning off Keppra, and this was completely discontinued the spring/summer of 2015. Around 2-1/2 years ago, he had also been reporting anxiety to his neurologist, and was started on clorazepate 7.5mg , weaned to 1/2 tab for 6 months, then discontinued 3 days prior to the most recent nocturnal seizure on 12/16 off AEDs. He was brought to [facility] ER, there is no bloodwork or drug screen for review, CT head unremarkable.He was found to have posterior dislocation and comminuted fracture of the right humeral head. He had soft tissue injury extending along the right anterior proximal arm. This was reduced in the ER and he continues to wear a sling. He was restarted on Keppra 500mg  BID with no side effects except for mild drowsiness.   His wife reports that since coming off the [place] last year, he has had 3 incidences of memory lapses, where he could not recall the last few hours. His wife reports  that he would look confused but he can function and drive. The last episode occurred 6 weeks ago, he did not recall being in church. His wife reports he was talking fine but had a different look about him. This lasted about 1-2 hours. He had neuropsychological testing for memory loss and was told he has "short-term memory problems." In hindsight, when asked about an MRI done in January 2012, he had an episode where he had transient memory loss while he was  at a [place]. I personally reviewed MRI brain without contrast done which showed mild chronic microvascular changes in the bilateral subcortical regions and right central pons.  He denies any episodes of staring/unresponsiveness, no olfactory/gustatory hallucinations, deja vu, rising epigastric sensation, focal numbness/tingling/weakness, myoclonic jerks. He has 1-4 drinks daily (beer or whisky), with no change in pattern for many years. He denies any sleep deprivation prior to the seizures, but reports these being stressful periods. 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 There is no history of febrile convulsions, CNS infections such as meningitis/encephalitis, significant traumatic brain injury, neurosurgical procedures, or family history of seizures.   PAST MEDICAL HISTORY: Past Medical History:  Diagnosis Date  . Aortic stenosis    severe AS s/p TAVR (03/22/17) with a 62mm Edwards Sapien 3 THV via the TF approach  . Arthritis    "pain in my knees" (03/30/2016)  . CAD S/P percutaneous coronary angioplasty 03/31/2016   a. CP/USA -> LHC 03/31/16: 90% prox-mid RCA s/p DES, 40% prox-mid LAD, 25% D2, 25% prox-mid Cx, elevated LVEDP 33.  . Chronic diastolic CHF (congestive heart failure) ([place]) 03/31/2016  . Chronic lower back pain   . Dyslipidemia   . LVH (left ventricular hypertrophy)    a. severe basal septal hypertrophy by echo 03/2016.  . Mitral regurgitation    a. mild by echo 03/2016.  Marland Kitchen Psoriasis   . Rheumatic fever    as a child  . Seizure ([place]) 01/2011; 07/2011; 07/2015      "idiopathic; been on Kepra since 07/2011"  . Sinus bradycardia - baseline HR 50s at times.   . Sleep apnea   . 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 Thrombosed hemorrhoids     MEDICATIONS: Current Outpatient Medications on File Prior to Visit  Medication Sig Dispense Refill  . acetaminophen (TYLENOL) 500  MG tablet Take 1,000 mg by mouth daily as needed for moderate pain.     Marland Kitchen amoxicillin (AMOXIL) 500 MG capsule Take 1 capsule (500 mg total) by mouth as needed. Please take four pills before dental procedures. 12 capsule 1  . aspirin 81 MG tablet Take 81 mg by mouth daily.    . calcium carbonate (TUMS - DOSED IN MG ELEMENTAL CALCIUM) 500 MG chewable tablet Chew 1 tablet by mouth daily.    . clobetasol ointment (TEMOVATE) 7.34 % Apply 1 application topically daily as needed (skin irritation).   1  . clopidogrel (PLAVIX) 75 MG tablet Take 1 tablet (75 mg total) by mouth daily. 90 tablet 3  . desonide (DESOWEN) 0.05 % cream Apply 1 application topically daily as needed (psoriasis).     . diphenhydrAMINE (BENADRYL) 2 % cream Apply 1 application topically daily as needed for itching.     . diphenhydrAMINE (BENADRYL) 25 mg capsule Take 25 mg by mouth daily as needed for allergies.    Marland Kitchen esomeprazole ([place]) 20  MG capsule Take 20 mg by mouth daily as needed.    Marland Kitchen HUMIRA PEN 40 MG/0.8ML PNKT Inject 40 mg into the skin every 28 (twenty-eight) days.     Marland Kitchen HYDROCORTISONE EX Apply 1 application topically daily as needed (itching).    Marland Kitchen levETIRAcetam (KEPPRA) 750 MG tablet Take 1 tablet (750 mg total) by mouth 2 (two) times daily. 180 tablet 3  . nitroGLYCERIN (NITROSTAT) 0.4 MG SL tablet Place 1 tablet (0.4 mg total) under the tongue every 5 (five) minutes as needed for chest pain (up to 3 doses). 25 tablet 3  . PROCTOZONE-HC 2.5 % rectal cream Apply 1 application topically daily as needed for hemorrhoids or itching.     . rosuvastatin (CRESTOR) 20 MG tablet TAKE 1 TABLET(20 MG) BY MOUTH DAILY 30 tablet 9   No current facility-administered medications on file prior to visit.     ALLERGIES: Allergies  Allergen Reactions  . No Known Allergies     FAMILY HISTORY: Family History  Problem Relation Age of Onset  . Renal Disease Mother   . Congestive Heart Failure Father   . Depression Sister      SOCIAL HISTORY: Social History   Socioeconomic History  . Marital status: Married    Spouse name: Not on file  . Number of children: Not on file  . Years of education: Not on file  . Highest education level: Not on file  Social Needs  . Financial resource strain: Not on file  . Food insecurity - worry: Not on file  . Food insecurity - inability: Not on file  . Transportation needs - medical: Not on file  . Transportation needs - non-medical: Not on file  Occupational History  . Not on file  Tobacco Use  . Smoking status: Never Smoker  . Smokeless tobacco: Never Used  Substance and Sexual Activity  . Alcohol use: Yes    Alcohol/week: 8.4 oz    Types: 7 Shots of liquor, 7 Cans of beer per week  . Drug use: No  . Sexual activity: Not Currently  Other Topics Concern  . Not on file  Social History Narrative  . Not on file    REVIEW OF SYSTEMS: Constitutional: No fevers, chills, or sweats, no generalized fatigue, change in appetite Eyes: No visual changes, double vision, eye pain Ear, nose and throat: No hearing loss, ear pain, nasal congestion, sore throat Cardiovascular: No chest pain, palpitations Respiratory:  No shortness of breath at rest or with exertion, wheezes GastrointestinaI: No nausea, vomiting, diarrhea, abdominal pain, fecal incontinence Genitourinary:  No dysuria, urinary retention or frequency Musculoskeletal:  +joint pain, left knee pain Integumentary: No rash, pruritus, skin lesions Neurological: as above Psychiatric: + depression, insomnia, anxiety Endocrine: No palpitations, fatigue, diaphoresis, mood swings, change in appetite, change in weight, increased thirst Hematologic/Lymphatic:  No anemia, purpura, petechiae. Allergic/Immunologic: no itchy/runny eyes, nasal congestion, recent allergic reactions, rashes  PHYSICAL EXAM: Vitals:   09/05/17 1008  BP: (!) 142/68  Pulse: 75  SpO2: 96%   General: No acute distress, tired-appearing Head:   Normocephalic/atraumatic Neck: supple, no paraspinal tenderness, full range of motion Heart:  Regular rate and rhythm Lungs:  Clear to auscultation bilaterally Back: No paraspinal tenderness Skin/Extremities: No rash, no edema Neurological Exam: alert and oriented to person, place, and time. No aphasia or dysarthria. Fund of knowledge is appropriate.  Recent and remote memory are intact. Attention and concentration are normal.    Able to name objects and repeat phrases. Cranial  nerves: Pupils equal, round, reactive to light.  Extraocular movements intact with no nystagmus. Visual fields full. Facial sensation intact. No facial asymmetry. Tongue, uvula, palate midline.  Motor: Bulk and tone normal, muscle strength 5/5 throughout with no pronator drift.  Sensation to light touch intact.  No extinction to double simultaneous stimulation.  Deep tendon reflexes 2+ throughout, toes downgoing.  Finger to nose testing intact.  Gait slightly wide-based, no ataxia. Romberg negative.  IMPRESSION: This is a 72 yo RH man with a history of 2 seizures in 2012. As he had been seizure-free for 3 years, Keppra was tapered off last spring/summer 2015. He had been taking clorazepate for anxiety and was tapered off this 3 days prior to the nocturnal seizure in 08/03/14. No further convulsions since then. 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 He had another episode of gap in time last July 2017, Keppra dose increased to 750mg  BID. He denies any further seizures or seizure-like symptoms since July 2017, no side effects on Keppra. He is again reporting a lot of depression and poor sleep, and is agreeable to starting an antidepressant that could help with sleep as well. We discussed starting low dose nortriptyline 10mg  qhs, side effects were discussed, we may uptitrate in a month as tolerated. He is aware of [place] driving laws that indicate one has to  stop driving after a seizure, until 6 months seizure-free. He will follow-up in 4 months or earlier if needed.   Thank you for allowing me to participate in his care.  Please do not hesitate to call for any questions or concerns.  The duration of this appointment visit was 25 minutes of face-to-face time with the patient.  Greater than 50% of this time was spent in counseling, explanation of diagnosis, planning of further management, and coordination of care.   Ellouise Newer, M.D.   CC: Dr. Harrington Challenger

## 2017-09-05 NOTE — Patient Instructions (Signed)
1. Continue Keppra 750mg  twice a day 2. Start nortriptyline 10mg  at night. Call/contact our office on how you are feeling in a month, and we can adjust dose as needed. 3. Follow-up in 4 months, call for any changes  Seizure Precautions: 1. If medication has been prescribed for you to prevent seizures, take it exactly as directed.  Do not stop taking the medicine without talking to your doctor first, even if you have not had a seizure in a long time.   2. Avoid activities in which a seizure would cause danger to yourself or to others.  Don't operate dangerous machinery, swim alone, or climb in high or dangerous places, such as on ladders, roofs, or girders.  Do not drive unless your doctor says you may.  3. If you have any warning that you may have a seizure, lay down in a safe place where you can't hurt yourself.    4.  No driving for 6 months from last seizure, as per [facility].   Please refer to the following link on the [place] website for more information: [email or web address]/[email or web address]/[email or web address]   5.  Maintain good sleep hygiene. Avoid alcohol.  6.  Contact your doctor if you have any problems that may be related to the medicine you are taking.  7.  Call 911 and bring the patient back to the ED if:        A.  The seizure lasts longer than 5 minutes.       B.  The patient doesn't awaken shortly after the seizure  C.  The patient has new problems such as difficulty seeing, speaking or moving  D.  The patient was injured during the seizure  E.  The patient has a temperature over 102 F (39C)  F.  The patient vomited and now is having trouble breathing

## 2017-09-21 ENCOUNTER — Encounter: Payer: Self-pay | Admitting: Physician Assistant

## 2017-09-24 IMAGING — CT CT ANGIO CHEST
2 of 9 series · 15 of 36 positions shown · IV contrast (isovue)
Comparison: Cardiac CTA 02/02/2017. CT of the chest, abdomen and
pelvis 12/21/2011.

CLINICAL DATA: 70-year-old male with history of severe aortic
stenosis. Preprocedural study prior to potential transcatheter
aortic valve replacement (TAVR) procedure.

EXAM:
CT ANGIOGRAPHY CHEST, ABDOMEN AND PELVIS
TECHNIQUE: Multidetector CT imaging through the chest, abdomen and pelvis was
performed using the standard protocol during bolus administration of
intravenous contrast. Multiplanar reconstructed images and MIPs were
obtained and reviewed to evaluate the vascular anatomy.
CONTRAST:  100 mL of Isovue 370.

[Series 7: dissection 1.0 i30f 3 · axial · 0.59mm/px · z∈[-606,-65]mm · 14 of 615 slices shown]
[im 37/615  lung]
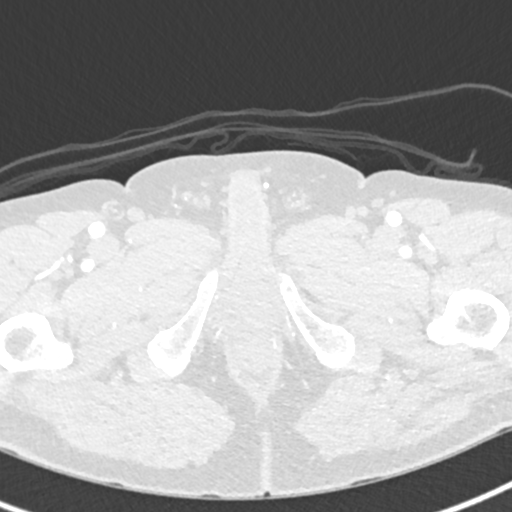
[im 73/615  mediastinal]
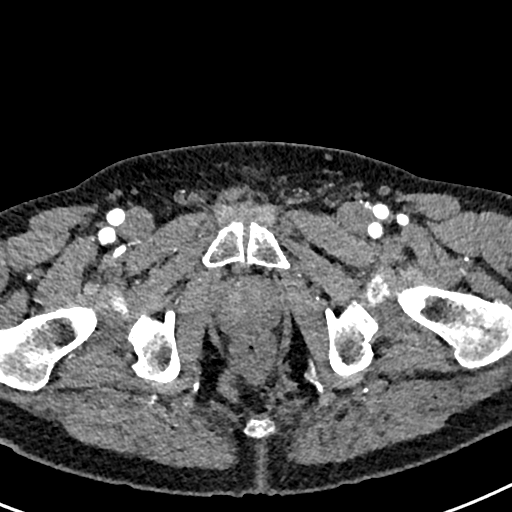
[im 109/615  lung]
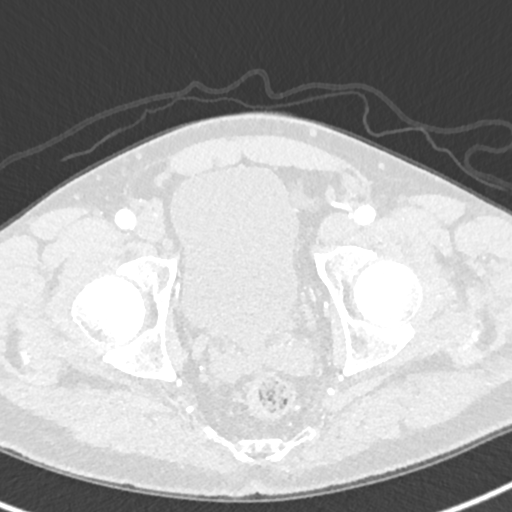
[im 181/615  mediastinal]
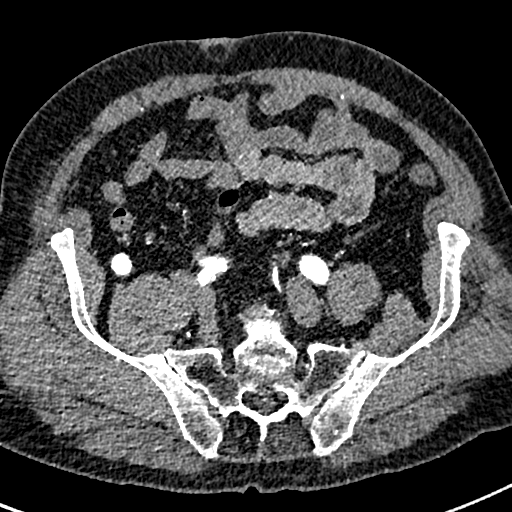
[im 217/615  lung]
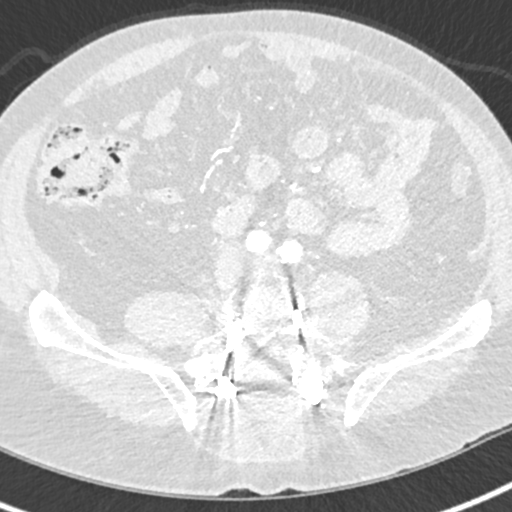
[im 253/615  mediastinal]
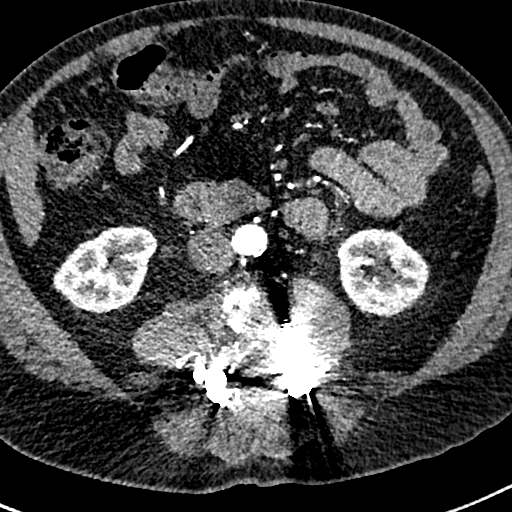
[im 289/615  lung]
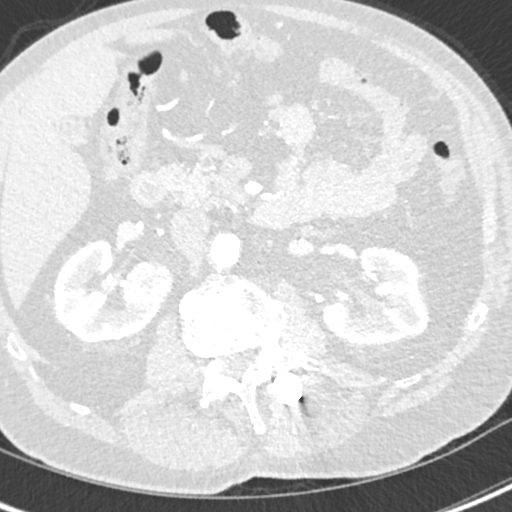
[im 326/615  mediastinal]
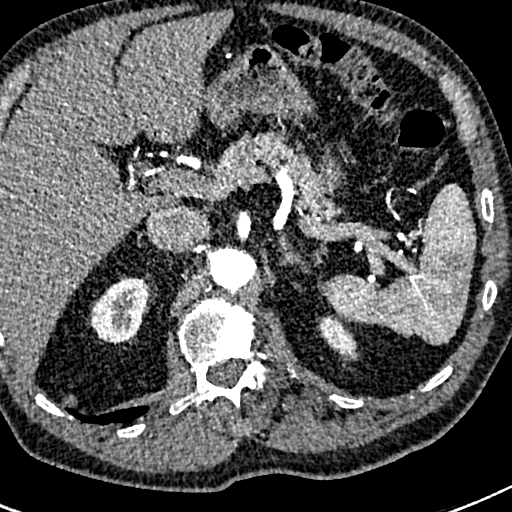
[im 362/615  lung]
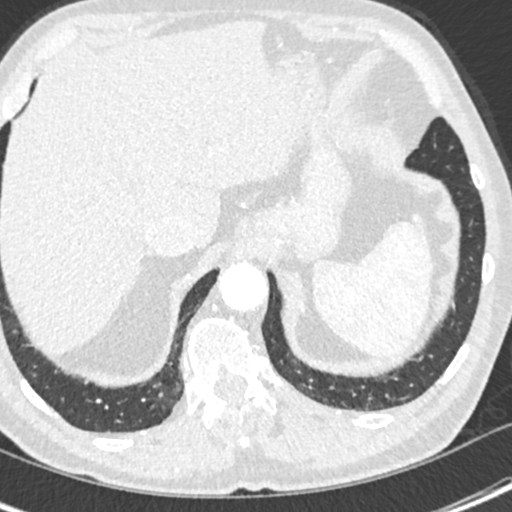
[im 398/615  mediastinal]
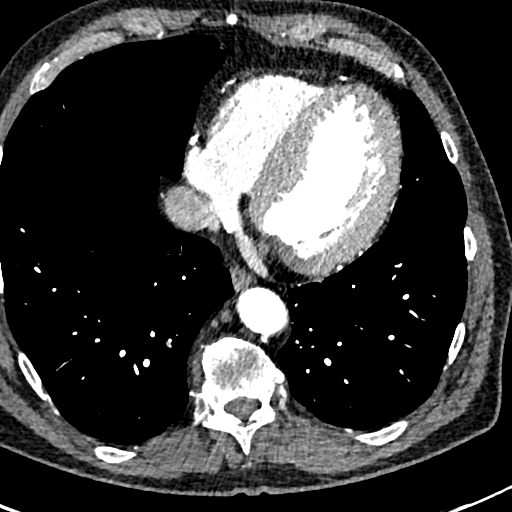
[im 470/615  lung]
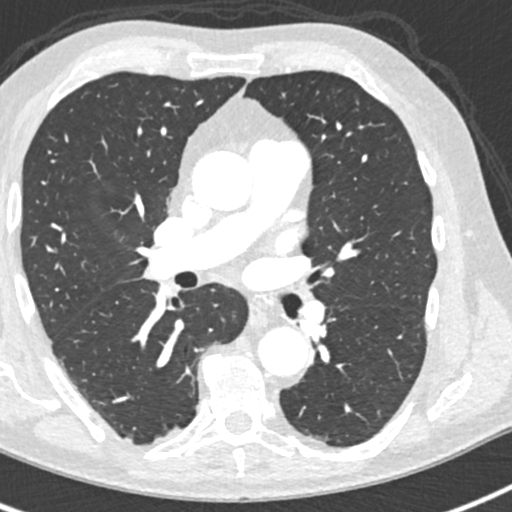
[im 506/615  mediastinal]
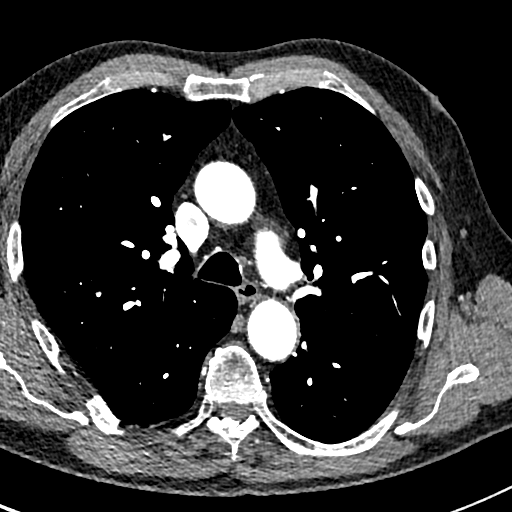
[im 542/615  lung]
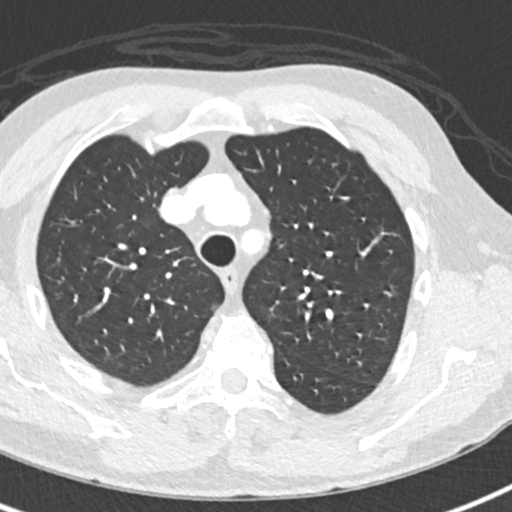
[im 578/615  mediastinal]
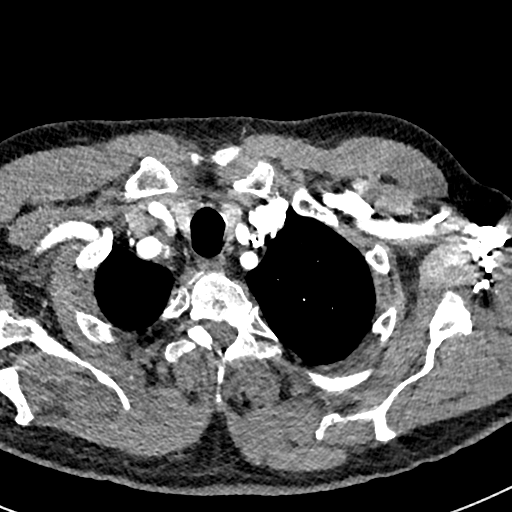

[Series 8: coronals · coronal · 0.90mm/px · 1 of 149 slices shown]
[im 75/149  mediastinal]
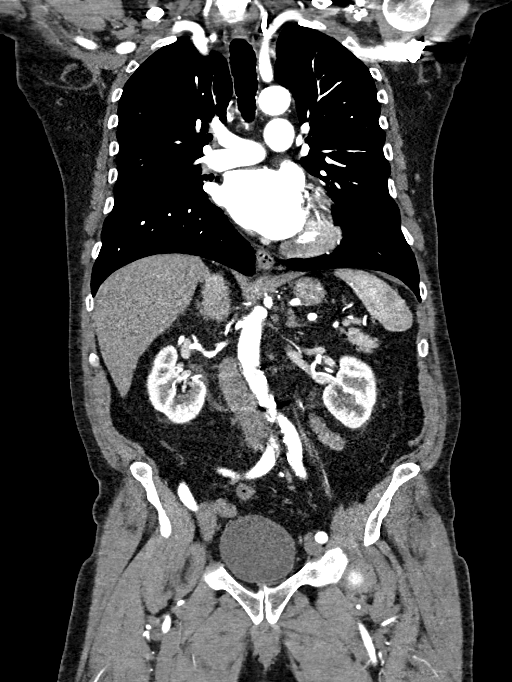

[15 of 36 positions shown; findings below may reference images not displayed]

FINDINGS: CTA CHEST FINDINGS

Cardiovascular: Heart size is borderline enlarged with concentric
left ventricular hypertrophy. There is no significant pericardial
fluid, thickening or pericardial calcification. There is aortic
atherosclerosis, as well as atherosclerosis of the great vessels of
the mediastinum and the coronary arteries, including calcified
atherosclerotic plaque in the left main, left anterior descending,
left circumflex and right coronary arteries. Severe thickening
calcification of the aortic valve.

Mediastinum/Lymph Nodes: No pathologically enlarged mediastinal or
hilar lymph nodes. Esophagus is unremarkable in appearance. No
axillary lymphadenopathy.

Lungs/Pleura: No suspicious-appearing pulmonary nodules or masses.
No acute consolidative airspace disease. No pleural effusions.

Musculoskeletal/Soft Tissues: There are no aggressive appearing
lytic or blastic lesions noted in the visualized portions of the
skeleton. Multiple old healed right-sided rib fractures.

CTA ABDOMEN AND PELVIS FINDINGS

Hepatobiliary: No cystic or solid hepatic lesions. No intra or
extrahepatic biliary ductal dilatation. Gallbladder is nearly
decompressed, but otherwise unremarkable in appearance.

Pancreas: No pancreatic mass. No pancreatic ductal dilatation. No
pancreatic or peripancreatic fluid or inflammatory changes.

Spleen: Unremarkable.

Adrenals/Urinary Tract: In the medial aspect of the interpolar
region of the right kidney there is an avidly enhancing mass which
measures 2.6 x 3.7 x 3.4 cm (axial image 107 of series 5 and coronal
image 86 of series 8), compatible with a solid renal neoplasm. This
is separate from the right renal vein which is widely patent, and is
completely encapsulated within Gerota's fascia. 2 cm exophytic
simple cyst in the lower pole of the right kidney. 1.4 cm exophytic
simple cyst in the interpolar region of the left kidney. No
hydroureteronephrosis. Urinary bladder is normal in appearance.
Bilateral adrenal glands are normal in appearance.

Stomach/Bowel: Normal appearance of the stomach. No pathologic
dilatation of small bowel or colon. A few scattered colonic
diverticulae are noted, without surrounding inflammatory changes to
suggest an acute diverticulitis at this time.

Vascular/Lymphatic: Aortic atherosclerosis, with vascular findings
and measurements pertinent to potential TAVR procedure, as detailed
below. No aneurysm or dissection identified in the abdominal or
pelvic vasculature. Celiac axis, superior mesenteric artery and
inferior mesenteric artery are all widely patent without
hemodynamically significant stenosis. Single renal artery is are
also patent bilaterally. No lymphadenopathy noted in the abdomen or
pelvis.

Reproductive: Prostate gland and seminal vesicles are unremarkable
in appearance.

Other: No significant volume of ascites.  No pneumoperitoneum.

Musculoskeletal: Status post PLIF from L3-S1 with interbody grafts
at L3-L4, L4-L5 and L5-S1. Osteopenia throughout the areas of spinal
fixation in the lumbar spine. There are no aggressive appearing
lytic or blastic lesions noted in the visualized portions of the
skeleton.

VASCULAR MEASUREMENTS PERTINENT TO TAVR:

AORTA:

Minimal Aortic Diameter -  17 x 14 mm

Severity of Aortic Calcification -  moderate

RIGHT PELVIS:

Right Common Iliac Artery -

Minimal Diameter - 10.6 x 10.4 mm

Tortuosity - mild

Calcification - mild

Right External Iliac Artery -

Minimal Diameter - 9.8 x 9.7 mm

Tortuosity - severe

Calcification - none

Right Common Femoral Artery -

Minimal Diameter - 10.6 x 8.6 mm

Tortuosity - mild

Calcification - mild

LEFT PELVIS:

Left Common Iliac Artery -

Minimal Diameter - 12.0 x 10.7 mm

Tortuosity - mild

Calcification - mild

Left External Iliac Artery -

Minimal Diameter - 9.2 x 9.9 mm

Tortuosity - severe

Calcification - none

Left Common Femoral Artery -

Minimal Diameter - 10.5 x 9.2 mm

Tortuosity - mild

Calcification - mild

Review of the MIP images confirms the above findings.
IMPRESSION: 1. Vascular findings and measurements pertinent to potential TAVR
procedure, as detailed above. This patient does appear to have
suitable pelvic arterial access bilaterally, particularly in terms
of vascular size, however, please note the extreme tortuosity of the
external iliac arteries bilaterally.
2. Severe thickening calcification of the aortic valve, compatible
with the reported clinical history of severe aortic stenosis.
3. Concentric left ventricular hypertrophy.
4. Aortic atherosclerosis, in addition to left main and 3 vessel
coronary artery disease. Assessment for potential risk factor
modification, dietary therapy or pharmacologic therapy may be
warranted, if clinically indicated.
5. Avidly enhancing mass in the medial aspect of the interpolar
region of the right kidney. In retrospect, there was likely a lesion
of similar size on prior CT of the chest, abdomen and pelvis
12/21/2011, however, at the time of that examination the contrast
bolus was so suboptimal that the lesion was not readily apparent.
Accordingly, although this is likely a solid neoplasm, this is
likely to represent a benign lesion such as a lipid poor
angiomyolipoma or oncocytoma. This lesion is completely encapsulated
within Gerota's fascia and does not involve the right renal vein at
this time. Accordingly, follow-up CT of the abdomen with and without
IV contrast is recommended in 6 months to reassess this lesion and
ensure stability.
6. Additional incidental findings, as above.
Aortic Atherosclerosis ([id number]).

## 2017-09-29 ENCOUNTER — Encounter: Payer: Self-pay | Admitting: Neurology

## 2017-09-29 ENCOUNTER — Other Ambulatory Visit: Payer: Self-pay | Admitting: Neurology

## 2017-09-29 DIAGNOSIS — F321 Major depressive disorder, single episode, moderate: Secondary | ICD-10-CM

## 2017-09-29 MED ORDER — NORTRIPTYLINE HCL 10 MG PO CAPS: ORAL_CAPSULE | ORAL | 6 refills | Status: DC

## 2017-09-30 ENCOUNTER — Other Ambulatory Visit: Payer: Self-pay

## 2017-09-30 MED ORDER — NORTRIPTYLINE HCL 10 MG PO CAPS: 20.0000 mg | ORAL_CAPSULE | Freq: Every day | ORAL | 11 refills | Status: DC

## 2017-10-03 IMAGING — CR DG CHEST 2V
2 series · 2 of 2 positions shown · non-contrast
Comparison: Chest x-ray of December 19, 2015

CLINICAL DATA: Preoperative examination prior to aortic valve
replacement. History of aortic stenosis, mitral regurgitation,
coronary artery disease status post angioplasty, CHF.

EXAM:
CHEST  2 VIEW

[w chest pa]
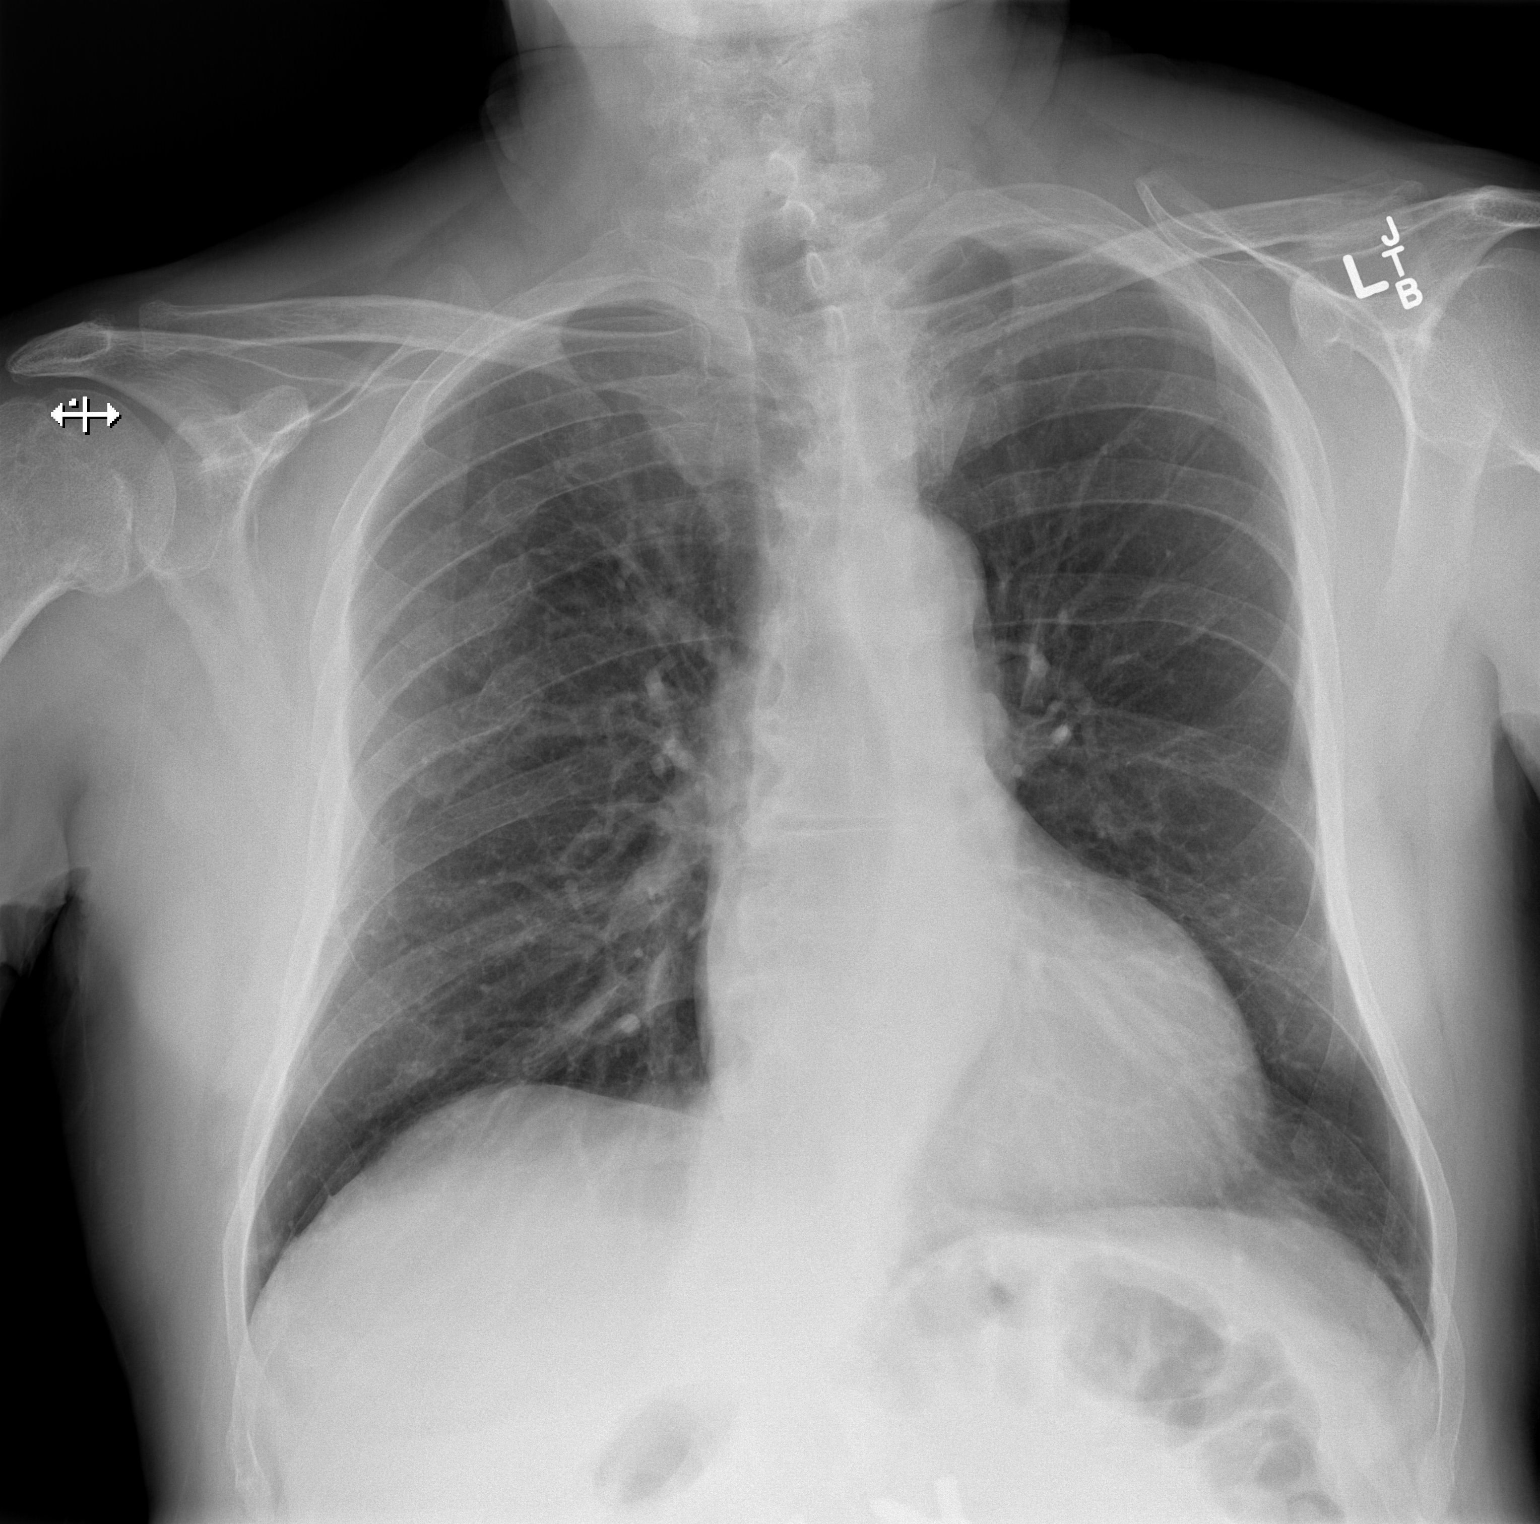

[w chest lat]
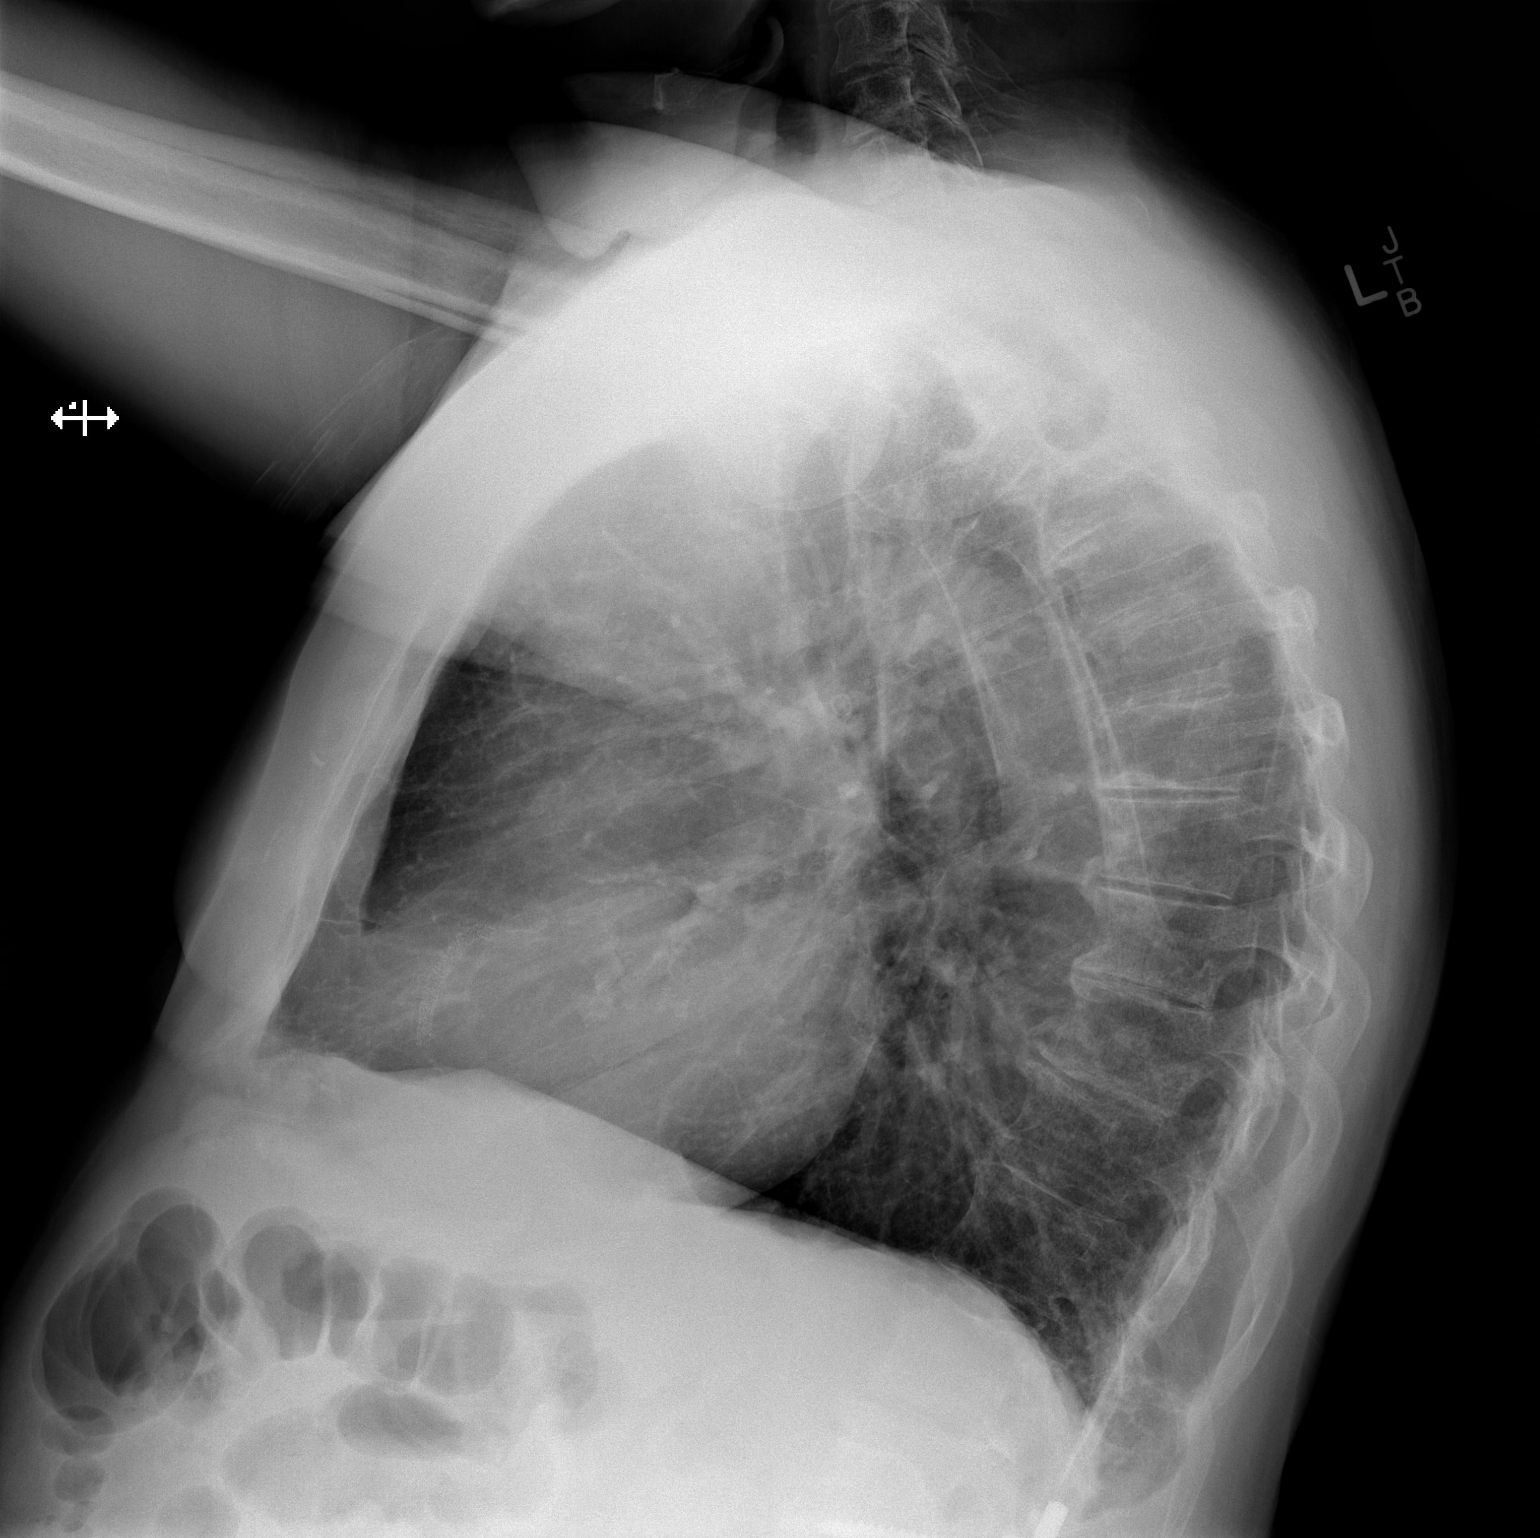

[2 of 2 positions shown; findings below may reference images not displayed]

FINDINGS: The lungs are adequately inflated. There is no focal infiltrate. The
heart and pulmonary vascularity are normal. The mediastinum is
normal in width. There is calcification in the wall of the aortic
arch. There is no pleural effusion. There are multiple old posterior
rib fractures on the right. There is multilevel degenerative disc
disease of the thoracic spine.
IMPRESSION: There is no pneumonia, CHF, nor other acute cardiopulmonary
abnormality.

Thoracic aortic atherosclerosis.

## 2017-10-06 ENCOUNTER — Other Ambulatory Visit: Payer: Self-pay

## 2017-10-06 MED ORDER — NORTRIPTYLINE HCL 10 MG PO CAPS: 20.0000 mg | ORAL_CAPSULE | Freq: Every day | ORAL | 11 refills | Status: DC

## 2017-10-08 IMAGING — DX DG CHEST 1V PORT
1 series · 1 of 1 positions shown · non-contrast
Comparison: 03/17/2017

CLINICAL DATA: Status post transcatheter aortic valve replacement.

EXAM:
PORTABLE CHEST 1 VIEW

[chest ap]
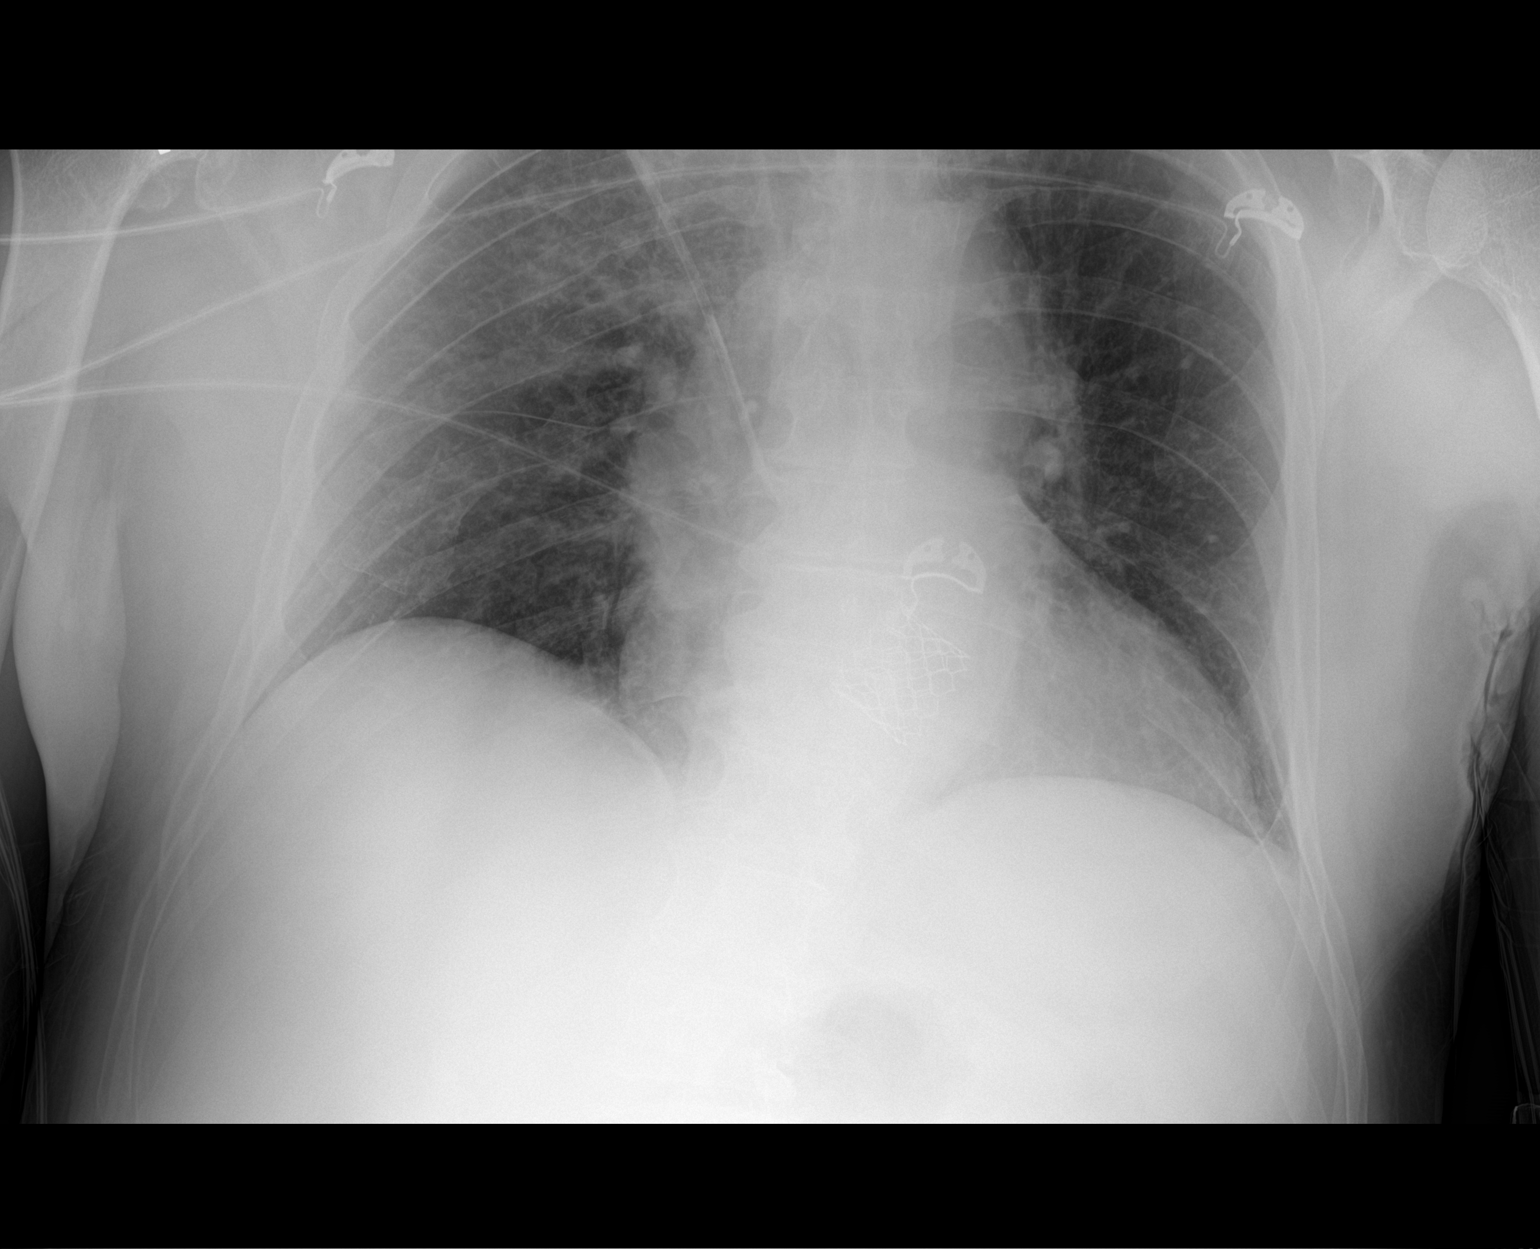

[1 of 1 positions shown; findings below may reference images not displayed]

FINDINGS: Evidence for a transcatheter aortic valve replacement. There is a
right jugular catheter with the tip in the lower SVC region. Patchy
densities in the right upper lung may be related to atelectasis.
Negative for pneumothorax. Heart size is grossly stable.
IMPRESSION: Patchy densities in right upper lung may be related to atelectasis.
Recommend attention on follow-up.

Central line tip in the lower SVC region. Negative for a
pneumothorax.

## 2017-10-11 ENCOUNTER — Other Ambulatory Visit: Payer: Self-pay | Admitting: Physician Assistant

## 2017-10-11 NOTE — Telephone Encounter (Signed)
Pt's pharmacy is requesting a refill on amoxicillin. Please address

## 2017-11-10 ENCOUNTER — Encounter: Payer: Self-pay | Admitting: Neurology

## 2017-11-10 DIAGNOSIS — F5104 Psychophysiologic insomnia: Secondary | ICD-10-CM

## 2017-11-10 DIAGNOSIS — F329 Major depressive disorder, single episode, unspecified: Secondary | ICD-10-CM

## 2017-11-10 DIAGNOSIS — F32A Depression, unspecified: Secondary | ICD-10-CM

## 2017-11-10 MED ORDER — TRAZODONE HCL 50 MG PO TABS: 50.0000 mg | ORAL_TABLET | Freq: Every day | ORAL | 5 refills | Status: DC

## 2017-11-13 DIAGNOSIS — I5032 Chronic diastolic (congestive) heart failure: Secondary | ICD-10-CM | POA: Insufficient documentation

## 2017-11-13 DIAGNOSIS — I10 Essential (primary) hypertension: Secondary | ICD-10-CM | POA: Insufficient documentation

## 2017-11-13 NOTE — Progress Notes (Signed)
Cardiology Office Note:    Date:  11/14/2017   ID:  Douglas Lane, DOB 06/13/1946, MRN [id number]  PCP:  Lawerance Cruel, MD  Cardiologist:  No primary care provider on file.    Referring MD: Lawerance Cruel, MD   Chief Complaint  Patient presents with  . Coronary Artery Disease  . Hypertension  . Congestive Heart Failure  . Aortic Stenosis  . Hyperlipidemia    History of Present Illness:    Douglas Lane is a 72 y.o. male with a hx of HLD, CAD s/p DES to RCA (03/2016) and 40% prox LAD and 50% ostial PDA, HTN, chronic diastolic CHFand severe AS s/p TAVR (03/22/17) with a 43mm Edwards Sapien 3 THV via the TF approach. Echo post TAVR showed normal LVF with G2DD with stable TAVR with AVA 2cm2, mean AVG 35mmHg and peak AVG 29mmHg.    He is here today for followup and is doing well.  He is just getting over a URI last week.  He denies any chest pain or pressure,  PND, orthopnea, LE edema, dizziness, palpitations or syncope. He occasionally has some DOE when going up stairs fast but otherwise no SOB.  He has not been exercising recently due to his cold.  He walks the dog and cuts the grass with no problems.  He is compliant with his meds and is tolerating meds with no SE.      Past Medical History:  Diagnosis Date  . Aortic stenosis    severe AS s/p TAVR (03/22/17) with a 7mm Edwards Sapien 3 THV via the TF approach  . Arthritis    "pain in my knees" (03/30/2016)  . CAD S/P percutaneous coronary angioplasty 03/31/2016   a. CP/USA -> LHC 03/31/16: 90% prox-mid RCA s/p DES, 40% prox-mid LAD, 25% D2, 25% prox-mid Cx, elevated LVEDP 33.  . Chronic diastolic CHF (congestive heart failure) ([place]) 03/31/2016  . Chronic lower back pain   . Dyslipidemia   . LVH (left ventricular hypertrophy)    a. severe basal septal hypertrophy by echo 03/2016.  . Mitral regurgitation    a. mild by echo 03/2016.  Marland Kitchen Psoriasis   . Rheumatic fever    as a child  . 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 Sinus bradycardia - baseline HR 50s at times.   . Sleep apnea   . 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 Thrombosed hemorrhoids     Past Surgical History:  Procedure Laterality Date  . APPENDECTOMY    . BACK SURGERY    . CARDIAC CATHETERIZATION N/A 03/30/2016   Procedure: Right/Left Heart Cath and Coronary Angiography;  Surgeon: Jettie Booze, MD;  Location: [facility];  Service: Cardiovascular;  Laterality: N/A;  . CARDIAC CATHETERIZATION N/A 03/30/2016   Procedure: Coronary Stent Intervention;  Surgeon: Jettie Booze, MD;  Location: [facility];  Service: Cardiovascular;  Laterality: N/A;  . COLONOSCOPY    . CORONARY ANGIOPLASTY    . FOREARM FRACTURE SURGERY Right ~ 2003-2004   "got steel plate in there"  . I&D hemorrhoirds    . KNEE ARTHROSCOPY Bilateral   . MOHS SURGERY Left    temple  . ORIF SCAPULAR FRACTURE Right    right  . POSTERIOR LUMBAR FUSION  2004  . TEE WITHOUT CARDIOVERSION N/A 11/01/2016   Procedure: TRANSESOPHAGEAL ECHOCARDIOGRAM (TEE);  Surgeon: Sanda Klein, MD;  Location: [facility];  Service: Cardiovascular;  Laterality: N/A;  . TEE WITHOUT CARDIOVERSION N/A 03/22/2017   Procedure: TRANSESOPHAGEAL ECHOCARDIOGRAM (TEE);  Surgeon: Sherren Mocha, MD;  Location: [facility];  Service: Open Heart Surgery;  Laterality: N/A;  . TONSILLECTOMY    . TRANSCATHETER AORTIC VALVE REPLACEMENT, TRANSFEMORAL N/A 03/22/2017   Procedure: TRANSCATHETER AORTIC VALVE REPLACEMENT, TRANSFEMORAL;  Surgeon: Sherren Mocha, MD;  Location: [facility];  Service: Open Heart Surgery;  Laterality: N/A;    Current Medications: Current Meds  Medication Sig  . acetaminophen (TYLENOL) 500 MG tablet Take 1,000 mg by mouth daily as needed for moderate pain.   Marland Kitchen amoxicillin (AMOXIL) 500 MG capsule TAKE FOUR CAPSULES BY MOUTH 1 HOUR BEFORE DENTAL APPOINTMENT  . aspirin 81 MG tablet Take 81 mg by mouth daily.  . calcium  carbonate (TUMS - DOSED IN MG ELEMENTAL CALCIUM) 500 MG chewable tablet Chew 1 tablet by mouth daily.  Marland Kitchen desonide (DESOWEN) 0.05 % cream Apply 1 application topically daily as needed (psoriasis).   . diphenhydrAMINE (BENADRYL) 25 mg capsule Take 25 mg by mouth daily as needed for allergies.  Marland Kitchen HUMIRA PEN 40 MG/0.8ML PNKT Inject 40 mg into the skin every 28 (twenty-eight) days.   Marland Kitchen levETIRAcetam (KEPPRA) 750 MG tablet Take 1 tablet (750 mg total) by mouth 2 (two) times daily.  . nitroGLYCERIN (NITROSTAT) 0.4 MG SL tablet Place 1 tablet (0.4 mg total) under the tongue every 5 (five) minutes as needed for chest pain (up to 3 doses).  Marland Kitchen PROCTOZONE-HC 2.5 % rectal cream Apply 1 application topically daily as needed for hemorrhoids or itching.   . rosuvastatin (CRESTOR) 20 MG tablet TAKE 1 TABLET(20 MG) BY MOUTH DAILY  . traZODone (DESYREL) 50 MG tablet Take 1 tablet (50 mg total) by mouth at bedtime.     Allergies:   No known allergies   Social History   Socioeconomic History  . Marital status: Married    Spouse name: Not on file  . Number of children: Not on file  . Years of education: Not on file  . Highest education level: Not on file  Occupational History  . Not on file  Social Needs  . Financial resource strain: Not on file  . Food insecurity:    Worry: Not on file    Inability: Not on file  . Transportation needs:    Medical: Not on file    Non-medical: Not on file  Tobacco Use  . Smoking status: Never Smoker  . Smokeless tobacco: Never Used  Substance and Sexual Activity  . Alcohol use: Yes    Alcohol/week: 8.4 oz    Types: 7 Shots of liquor, 7 Cans of beer per week  . Drug use: No  . Sexual activity: Not Currently  Lifestyle  . Physical activity:    Days per week: Not on file    Minutes per session: Not on file  . Stress: Not on file  Relationships  . Social connections:    Talks on phone: Not on file    Gets together: Not on file    Attends religious service: Not  on file    Active member of club or organization: Not on file    Attends meetings of clubs or organizations: Not on file    Relationship status: Not on file  Other Topics Concern  . Not on file  Social History Narrative  . Not on file     Family History: The patient's family history includes Congestive Heart  Failure in his father; Depression in his sister; Renal Disease in his mother.  ROS:   Please see the history of present illness.    ROS  All other systems reviewed and negative.   EKGs/Labs/Other Studies Reviewed:    The following studies were reviewed today: none  EKG:  EKG is not ordered today.   Recent Labs: 03/17/2017: ALT 19 03/23/2017: BUN 18; Creatinine, Ser 0.92; Hemoglobin 12.2; Magnesium 1.8; Platelets 141; Potassium 3.9; Sodium 137   Recent Lipid Panel    Component Value Date/Time   CHOL 130 08/06/2016 0831   TRIG 82 08/06/2016 0831   HDL 50 08/06/2016 0831   CHOLHDL 2.6 08/06/2016 0831   VLDL 16 08/06/2016 0831   LDLCALC 64 08/06/2016 0831    Physical Exam:    VS:  BP 118/68   Pulse 67   Ht 5\' 7"  (1.702 m)   Wt 184 lb (83.5 kg)   SpO2 96%   BMI 28.82 kg/m     Wt Readings from Last 3 Encounters:  11/14/17 184 lb (83.5 kg)  09/05/17 185 lb (83.9 kg)  04/21/17 179 lb 6.4 oz (81.4 kg)     GEN:  Well nourished, well developed in no acute distress HEENT: Normal NECK: No JVD; No carotid bruits LYMPHATICS: No lymphadenopathy CARDIAC: RRR, no  rubs, gallops.  2/6 SM at RUSB RESPIRATORY:  Clear to auscultation with a few rhonchi with cough ABDOMEN: Soft, non-tender, non-distended MUSCULOSKELETAL:  No edema; No deformity  SKIN: Warm and dry NEUROLOGIC:  Alert and oriented x 3 PSYCHIATRIC:  Normal affect   ASSESSMENT:    1. CAD S/P percutaneous coronary angioplasty   2. Severe aortic stenosis   3. Dyslipidemia   4. Chronic diastolic heart failure ([place])   5. Benign essential HTN    PLAN:    In order of problems listed above:  1.  ASCAD -   Cath 2017 s/p DES to RCA with residual 40% prox LAD and 50% ostial PDA.  He denies any anginal sx.  He will continue on ASA 81mg  daily and statin.   2.  Severe AS s/p TAVR (03/22/17) with a 62mm Edwards Sapien 3 THV via the TF approach.  2D echo post procedure with stable TAVR and mean AVG 27mmHg.  He will continue on ASA.  3.  Hyperlipidemia with LDL goal < 70.  He will continue on Crestory 20mg  daily.    .4  Chronic diastolic CHF - He appears euvolemic on exam today and weight is stable. Currently not on diuretics.   5.  HTN - BP is well controlled on exam today.  He is not on any antihypertensive meds.    Medication Adjustments/Labs and Tests Ordered: Current medicines are reviewed at length with the patient today.  Concerns regarding medicines are outlined above.  No orders of the defined types were placed in this encounter.  No orders of the defined types were placed in this encounter.   Signed, Fransico Him, MD  11/14/2017 9:26 AM    [place]

## 2017-11-14 ENCOUNTER — Encounter: Payer: Self-pay | Admitting: Cardiology

## 2017-11-14 ENCOUNTER — Ambulatory Visit: Payer: Medicare Other | Admitting: Cardiology

## 2017-11-14 VITALS — BP 118/68 | HR 67 | Ht 67.0 in | Wt 184.0 lb

## 2017-11-14 DIAGNOSIS — I350 Nonrheumatic aortic (valve) stenosis: Secondary | ICD-10-CM

## 2017-11-14 DIAGNOSIS — I10 Essential (primary) hypertension: Secondary | ICD-10-CM

## 2017-11-14 DIAGNOSIS — I2510 Atherosclerotic heart disease of native coronary artery without angina pectoris: Secondary | ICD-10-CM | POA: Diagnosis not present

## 2017-11-14 DIAGNOSIS — I5032 Chronic diastolic (congestive) heart failure: Secondary | ICD-10-CM | POA: Diagnosis not present

## 2017-11-14 DIAGNOSIS — Z9861 Coronary angioplasty status: Secondary | ICD-10-CM

## 2017-11-14 DIAGNOSIS — E785 Hyperlipidemia, unspecified: Secondary | ICD-10-CM | POA: Diagnosis not present

## 2017-11-14 NOTE — Patient Instructions (Signed)
Medication Instructions:  Your physician recommends that you continue on your current medications as directed. Please refer to the Current Medication list given to you today.  If you need a refill on your cardiac medications, please contact your pharmacy first.  Labwork: Your physician recommends that you return for lab work in: 1 week for liver function test and cholesterol   Testing/Procedures: None ordered   Follow-Up: Your physician wants you to follow-up in: 8 months with Dr. Radford Pax. You will receive a reminder letter in the mail two months in advance. If you don't receive a letter, please call our office to schedule the follow-up appointment.  Any Other Special Instructions Will Be Listed Below (If Applicable).   Thank you for choosing [facility], RN  [phone]  If you need a refill on your cardiac medications before your next appointment, please call your pharmacy.

## 2017-11-14 NOTE — Addendum Note (Signed)
Addended by: Teressa Senter on: 11/14/2017 09:39 AM   Modules accepted: Orders

## 2017-11-17 ENCOUNTER — Encounter: Payer: Self-pay | Admitting: Cardiology

## 2017-11-17 ENCOUNTER — Encounter: Payer: Self-pay | Admitting: Neurology

## 2017-11-21 ENCOUNTER — Other Ambulatory Visit: Payer: Medicare Other | Admitting: *Deleted

## 2017-11-21 DIAGNOSIS — E785 Hyperlipidemia, unspecified: Secondary | ICD-10-CM

## 2017-11-21 LAB — LIPID PANEL
CHOLESTEROL TOTAL: 143 mg/dL (ref 100–199)
Chol/HDL Ratio: 3.3 ratio (ref 0.0–5.0)
HDL: 44 mg/dL (ref >39)
LDL CALC: 80 mg/dL (ref 0–99)
Triglycerides: 93 mg/dL (ref 0–149)
VLDL Cholesterol Cal: 19 mg/dL (ref 5–40)

## 2017-11-21 LAB — HEPATIC FUNCTION PANEL
ALBUMIN: 4.1 g/dL (ref 3.5–4.8)
ALT: 19 IU/L (ref 0–44)
AST: 26 IU/L (ref 0–40)
Alkaline Phosphatase: 86 IU/L (ref 39–117)
BILIRUBIN TOTAL: 0.9 mg/dL (ref 0.0–1.2)
Bilirubin, Direct: 0.23 mg/dL (ref 0.00–0.40)
TOTAL PROTEIN: 5.8 g/dL — AB (ref 6.0–8.5)

## 2017-11-24 ENCOUNTER — Telehealth: Payer: Self-pay

## 2017-11-24 DIAGNOSIS — E785 Hyperlipidemia, unspecified: Secondary | ICD-10-CM

## 2017-11-24 MED ORDER — ROSUVASTATIN CALCIUM 40 MG PO TABS: 40.0000 mg | ORAL_TABLET | Freq: Every day | ORAL | 3 refills | Status: DC

## 2017-11-24 NOTE — Telephone Encounter (Signed)
Notes recorded by Teressa Senter, RN on 11/24/2017 at 8:43 AM EDT Patient made aware of lab results and Dr. Theodosia Blender recommendation to INCREASE Crestor to 40 mg once a day and patient scheduled for repeat labs on 01/11/18. Patient in agreement with treatment plan and thankful for the call  Notes recorded by Sueanne Margarita, MD on 11/21/2017 at 8:39 PM EDT LDL is not at goal - please increase Crestor to 40mg  daily and repeat FLP and ALT in 6 weeks

## 2017-12-12 ENCOUNTER — Other Ambulatory Visit: Payer: Self-pay | Admitting: Physician Assistant

## 2018-01-03 ENCOUNTER — Ambulatory Visit: Payer: Self-pay | Admitting: Neurology

## 2018-01-11 ENCOUNTER — Other Ambulatory Visit: Payer: Medicare Other | Admitting: *Deleted

## 2018-01-11 DIAGNOSIS — E785 Hyperlipidemia, unspecified: Secondary | ICD-10-CM

## 2018-01-11 LAB — LIPID PANEL
CHOL/HDL RATIO: 2.8 ratio (ref 0.0–5.0)
Cholesterol, Total: 148 mg/dL (ref 100–199)
HDL: 52 mg/dL (ref >39)
LDL Calculated: 82 mg/dL (ref 0–99)
Triglycerides: 69 mg/dL (ref 0–149)
VLDL CHOLESTEROL CAL: 14 mg/dL (ref 5–40)

## 2018-01-11 LAB — ALT: ALT: 18 IU/L (ref 0–44)

## 2018-01-12 ENCOUNTER — Telehealth: Payer: Self-pay

## 2018-01-12 DIAGNOSIS — E785 Hyperlipidemia, unspecified: Secondary | ICD-10-CM

## 2018-01-12 MED ORDER — EZETIMIBE 10 MG PO TABS: 10.0000 mg | ORAL_TABLET | Freq: Every day | ORAL | 11 refills | Status: DC

## 2018-01-12 NOTE — Telephone Encounter (Signed)
Notes recorded by Teressa Senter, RN on 01/12/2018 at 8:41 AM EDT Pt made aware of lab results and Dr. Theodosia Blender recommendation to Start Zetia 10 mg daily to target LDL <70 along with Crestor. Pt in agreement with treatment plan. Pt scheduled for repeat FLP and Alt on 03/22/18. Pt stated understanding and thankful for the call.  Notes recorded by Sueanne Margarita, MD on 01/11/2018 at 3:42 PM EDT LDL still not at goal - add zetia 10mg  daily and repeat FLP and ALT in 6 weeks

## 2018-01-19 ENCOUNTER — Ambulatory Visit: Payer: Medicare Other | Admitting: Neurology

## 2018-01-19 ENCOUNTER — Other Ambulatory Visit: Payer: Self-pay

## 2018-01-19 ENCOUNTER — Encounter: Payer: Self-pay | Admitting: Neurology

## 2018-01-19 VITALS — BP 116/72 | HR 71 | Ht 66.5 in | Wt 185.0 lb

## 2018-01-19 DIAGNOSIS — G40009 Localization-related (focal) (partial) idiopathic epilepsy and epileptic syndromes with seizures of localized onset, not intractable, without status epilepticus: Secondary | ICD-10-CM

## 2018-01-19 NOTE — Patient Instructions (Signed)
1. Continue Keppra 750mg  twice a day 2. If any change in symptoms, contact our office 3. Follow-up in 6 months  Seizure Precautions: 1. If medication has been prescribed for you to prevent seizures, take it exactly as directed.  Do not stop taking the medicine without talking to your doctor first, even if you have not had a seizure in a long time.   2. Avoid activities in which a seizure would cause danger to yourself or to others.  Don't operate dangerous machinery, swim alone, or climb in high or dangerous places, such as on ladders, roofs, or girders.  Do not drive unless your doctor says you may.  3. If you have any warning that you may have a seizure, lay down in a safe place where you can't hurt yourself.    4.  No driving for 6 months from last seizure, as per [facility].   Please refer to the following link on the [facility] website for more information: http://www.epilepsyfoundation.org/answerplace/Social/driving/drivingu.cfm   5.  Maintain good sleep hygiene. Avoid alcohol.  6.  Contact your doctor if you have any problems that may be related to the medicine you are taking.  7.  Call 911 and bring the patient back to the ED if:        A.  The seizure lasts longer than 5 minutes.       B.  The patient doesn't awaken shortly after the seizure  C.  The patient has new problems such as difficulty seeing, speaking or moving  D.  The patient was injured during the seizure  E.  The patient has a temperature over 102 F (39C)  F.  The patient vomited and now is having trouble breathing

## 2018-01-19 NOTE — Progress Notes (Signed)
NEUROLOGY FOLLOW UP OFFICE NOTE  [facility] [place] [id number]  DOB: 1946/05/11  HISTORY OF PRESENT ILLNESS: I had the pleasure of seeing Douglas Lane in follow-up in the neurology clinic on 01/19/2018.  The patient was last seen 5 months ago for recurrent seizures. He had been seizure-free for a year off Keppra, and had tapered of clorazepate which he was taking for anxiety. He had a generalized convulsion on 07/31/14, 3 days after stopping clorazepate. He was restarted on Keppra 500mg  BID. His wife had also reported that after stopping the Keppra, he had prolonged episodes of memory lapses where he could not recall the last few hours. He had been doing well since July 2017 when he had an amnestic episode at church. Keppra dose was increased to 750mg  BID at that time. Since then, he reports another amnestic episode last 12/24/17. He bought a table for his wife the day before and assembled it for her to find it on Saturday. That day, he took his usual nap, but it was longer than typical, 3 hours. When he woke up, he asked his family what that table was doing there and did not recall buying it the day before. As family continued to question him, his recall started coming back. He may have been sleep deprived the night prior. He was reporting sleep issues and depression on last visit, nortriptyline was denied by insurance and he started Trazodone. It seemed to help the first week, but did not seem to help after so he stopped it. After he stopped Trazodone, sleep has overall been better without medication. He occasionally wakes up bothered by stress at home, but not every night. He denies any headaches, dizziness, vision changes, focal numbness/tingling/weakness, olfactory/gustatory hallucinations, no falls.   HPI 08/20/2014: This is a 72 yo RH man with a history of 2 seizures in 2012 that occurred 6 months apart, presenting after ER visit for a seizure last 08/03/14. The first seizure occurred in June 2012. He  recalls it was a stressful period just soon after he retired. He had a nocturnal convulsion and was brought to [facility]. He was not started on medication initially as it was his first seizure. On December 2012, he had another nocturnal convulsion and was started on Keppra 500mg  BID with no further seizures for 3 years. He and his neurologist in [facility] agreed to start weaning off Keppra, and this was completely discontinued the spring/summer of 2015. Around 2-1/2 years ago, he had also been reporting anxiety to his neurologist, and was started on clorazepate 7.5mg , weaned to 1/2 tab for 6 months, then discontinued 3 days prior to the most recent nocturnal seizure on 12/16 off AEDs. He was brought to [facility] ER, there is no bloodwork or drug screen for review, CT head unremarkable.He was found to have posterior dislocation and comminuted fracture of the right humeral head. He had soft tissue injury extending along the right anterior proximal arm. This was reduced in the ER and he continues to wear a sling. He was restarted on Keppra 500mg  BID with no side effects except for mild drowsiness.   His wife reports that since coming off the [place] last year, he has had 3 incidences of memory lapses, where he could not recall the last few hours. His wife reports that he would look confused but he can function and drive. The last episode occurred 6 weeks ago, he did not recall being in church. His wife reports he was talking fine but  had a different look about him. This lasted about 1-2 hours. He had neuropsychological testing for memory loss and was told he has "short-term memory problems." In hindsight, when asked about an MRI done in January 2012, he had an episode where he had transient memory loss while he was at a [employer]. I personally reviewed MRI brain without contrast done which showed mild chronic microvascular changes in the bilateral subcortical regions and right central pons.  He denies any  episodes of staring/unresponsiveness, no olfactory/gustatory hallucinations, deja vu, rising epigastric sensation, focal numbness/tingling/weakness, myoclonic jerks. He has 1-4 drinks daily (beer or whisky), with no change in pattern for many years. He denies any sleep deprivation prior to the seizures, but reports these being stressful periods. 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 There is no history of febrile convulsions, CNS infections such as meningitis/encephalitis, significant traumatic brain injury, neurosurgical procedures, or family history of seizures.   PAST MEDICAL HISTORY: Past Medical History:  Diagnosis Date  . 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 Arthritis    "pain in my knees" (03/30/2016)  . CAD S/P percutaneous coronary angioplasty 03/31/2016   a. CP/USA -> LHC 03/31/16: 90% prox-mid RCA s/p DES, 40% prox-mid LAD, 25% D2, 25% prox-mid Cx, elevated LVEDP 33.  . Chronic diastolic CHF (congestive heart failure) ([place]) 03/31/2016  . Chronic lower back pain   . Dyslipidemia   . LVH (left ventricular hypertrophy)    a. severe basal septal hypertrophy by echo 03/2016.  . Mitral regurgitation    a. mild by echo 03/2016.  Marland Kitchen Psoriasis   . Rheumatic fever    as a child  . Seizure ([place]) 01/2011; 07/2011; 07/2015      "idiopathic; been on Kepra since 07/2011"  . Sinus bradycardia - baseline HR 50s at times.   . Sleep apnea   . 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 Thrombosed hemorrhoids     MEDICATIONS: Current Outpatient Medications on File Prior to Visit  Medication Sig Dispense Refill  . acetaminophen (TYLENOL) 500 MG tablet Take 1,000 mg by mouth daily as needed for moderate pain.     Marland Kitchen amoxicillin (AMOXIL) 500 MG capsule TAKE FOUR CAPSULES BY MOUTH 1 HOUR BEFORE DENTAL APPOINTMENT 12 capsule 0  . aspirin  81 MG tablet Take 81 mg by mouth daily.    . calcium carbonate (TUMS - DOSED IN MG ELEMENTAL CALCIUM) 500 MG chewable tablet Chew 1 tablet by mouth daily.    Marland Kitchen desonide (DESOWEN) 0.05 % cream Apply 1 application topically daily as needed (psoriasis).     . diphenhydrAMINE (BENADRYL) 25 mg capsule Take 25 mg by mouth daily as needed for allergies.    Marland Kitchen ezetimibe (ZETIA) 10 MG tablet Take 1 tablet (10 mg total) by mouth daily. 30 tablet 11  . HUMIRA PEN 40 MG/0.8ML PNKT Inject 40 mg into the skin every 28 (twenty-eight) days.     Marland Kitchen levETIRAcetam (KEPPRA) 750 MG tablet Take 1 tablet (750 mg total) by mouth 2 (two) times daily. 180 tablet 3  . nitroGLYCERIN (NITROSTAT) 0.4 MG SL tablet DISSOLVE 1 TABLET UNDER THE TONGUE EVERY 5 MINUTES AS NEEDED FOR CHEST PAIN( UP TO 3 DOSES) 25 tablet 5  . PROCTOZONE-HC 2.5 % rectal cream Apply 1 application topically daily  as needed for hemorrhoids or itching.     . rosuvastatin (CRESTOR) 40 MG tablet Take 1 tablet (40 mg total) by mouth daily. 90 tablet 3  . traZODone (DESYREL) 50 MG tablet Take 1 tablet (50 mg total) by mouth at bedtime. 30 tablet 5   No current facility-administered medications on file prior to visit.     ALLERGIES: Allergies  Allergen Reactions  . No Known Allergies     FAMILY HISTORY: Family History  Problem Relation Age of Onset  . Renal Disease Mother   . Congestive Heart Failure Father   . Depression Sister     SOCIAL HISTORY: Social History   Socioeconomic History  . Marital status: Married    Spouse name: Not on file  . Number of children: Not on file  . Years of education: Not on file  . Highest education level: Not on file  Occupational History  . Not on file  Social Needs  . Financial resource strain: Not on file  . Food insecurity:    Worry: Not on file    Inability: Not on file  . Transportation needs:    Medical: Not on file    Non-medical: Not on file  Tobacco Use  . Smoking status: Never Smoker  .  Smokeless tobacco: Never Used  Substance and Sexual Activity  . Alcohol use: Yes    Alcohol/week: 8.4 oz    Types: 7 Shots of liquor, 7 Cans of beer per week  . Drug use: No  . Sexual activity: Not Currently  Lifestyle  . Physical activity:    Days per week: Not on file    Minutes per session: Not on file  . Stress: Not on file  Relationships  . Social connections:    Talks on phone: Not on file    Gets together: Not on file    Attends religious service: Not on file    Active member of club or organization: Not on file    Attends meetings of clubs or organizations: Not on file    Relationship status: Not on file  . Intimate partner violence:    Fear of current or ex partner: Not on file    Emotionally abused: Not on file    Physically abused: Not on file    Forced sexual activity: Not on file  Other Topics Concern  . Not on file  Social History Narrative  . Not on file    REVIEW OF SYSTEMS: Constitutional: No fevers, chills, or sweats, no generalized fatigue, change in appetite Eyes: No visual changes, double vision, eye pain Ear, nose and throat: No hearing loss, ear pain, nasal congestion, sore throat Cardiovascular: No chest pain, palpitations Respiratory:  No shortness of breath at rest or with exertion, wheezes GastrointestinaI: No nausea, vomiting, diarrhea, abdominal pain, fecal incontinence Genitourinary:  No dysuria, urinary retention or frequency Musculoskeletal:  +joint pain, left knee pain Integumentary: No rash, pruritus, skin lesions Neurological: as above Psychiatric: + depression, insomnia, anxiety Endocrine: No palpitations, fatigue, diaphoresis, mood swings, change in appetite, change in weight, increased thirst Hematologic/Lymphatic:  No anemia, purpura, petechiae. Allergic/Immunologic: no itchy/runny eyes, nasal congestion, recent allergic reactions, rashes  PHYSICAL EXAM: Vitals:   01/19/18 1028  BP: 116/72  Pulse: 71  SpO2: 96%   General: No  acute distress, tired-appearing Head:  Normocephalic/atraumatic Neck: supple, no paraspinal tenderness, full range of motion Heart:  Regular rate and rhythm Lungs:  Clear to auscultation bilaterally Back: No paraspinal tenderness Skin/Extremities: No rash,  no edema Neurological Exam: alert and oriented to person, place, and time. No aphasia or dysarthria. Fund of knowledge is appropriate.  Recent and remote memory are intact. Attention and concentration are normal.    Able to name objects and repeat phrases. Cranial nerves: Pupils equal, round, reactive to light.  Extraocular movements intact with no nystagmus. Visual fields full. Facial sensation intact. No facial asymmetry. Tongue, uvula, palate midline.  Motor: Bulk and tone normal, muscle strength 5/5 throughout with no pronator drift.  Sensation to light touch intact.  No extinction to double simultaneous stimulation.  Deep tendon reflexes 2+ throughout, toes downgoing.  Finger to nose testing intact.  Gait slightly wide-based, no ataxia. Romberg negative.  IMPRESSION: This is a 73 yo RH man with a history of 2 seizures in 2012. As he had been seizure-free for 3 years, Keppra was tapered off last spring/summer 2015. He had been taking clorazepate for anxiety and was tapered off this 3 days prior to the nocturnal seizure in 08/03/14. No further convulsions since then. 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 He had been event-free since July 2017, until 12/24/17 when he had another amnestic episode. He denies missing medication, he may have been sleep deprived the night prior. We discussed continuing Keppra 750mg  BID for now, if he has more episodes we will increase dose to 1000mg  BID. He is aware of [place] driving laws that indicate one has to stop driving after a seizure, until 6 months seizure-free. He will follow-up in 6 months or earlier if needed.   Thank you for  allowing me to participate in his care.  Please do not hesitate to call for any questions or concerns.  The duration of this appointment visit was 30 minutes of face-to-face time with the patient.  Greater than 50% of this time was spent in counseling, explanation of diagnosis, planning of further management, and coordination of care.   Ellouise Newer, M.D.   CC: Dr. Harrington Challenger

## 2018-01-31 ENCOUNTER — Encounter: Payer: Self-pay | Admitting: Cardiology

## 2018-02-27 ENCOUNTER — Ambulatory Visit (HOSPITAL_COMMUNITY): Payer: Medicare Other | Attending: Cardiovascular Disease

## 2018-02-27 ENCOUNTER — Encounter: Payer: Self-pay | Admitting: Physician Assistant

## 2018-02-27 DIAGNOSIS — Z953 Presence of xenogenic heart valve: Secondary | ICD-10-CM | POA: Insufficient documentation

## 2018-02-27 DIAGNOSIS — I2510 Atherosclerotic heart disease of native coronary artery without angina pectoris: Secondary | ICD-10-CM | POA: Diagnosis present

## 2018-02-27 DIAGNOSIS — Z952 Presence of prosthetic heart valve: Secondary | ICD-10-CM | POA: Diagnosis not present

## 2018-02-27 DIAGNOSIS — I351 Nonrheumatic aortic (valve) insufficiency: Secondary | ICD-10-CM | POA: Insufficient documentation

## 2018-02-27 DIAGNOSIS — E785 Hyperlipidemia, unspecified: Secondary | ICD-10-CM | POA: Insufficient documentation

## 2018-02-27 DIAGNOSIS — I110 Hypertensive heart disease with heart failure: Secondary | ICD-10-CM | POA: Insufficient documentation

## 2018-02-27 DIAGNOSIS — I509 Heart failure, unspecified: Secondary | ICD-10-CM | POA: Insufficient documentation

## 2018-02-28 ENCOUNTER — Encounter: Payer: Self-pay | Admitting: Physician Assistant

## 2018-03-22 ENCOUNTER — Other Ambulatory Visit: Payer: Medicare Other | Admitting: *Deleted

## 2018-03-22 DIAGNOSIS — E785 Hyperlipidemia, unspecified: Secondary | ICD-10-CM

## 2018-03-22 LAB — LIPID PANEL
CHOL/HDL RATIO: 2.4 ratio (ref 0.0–5.0)
Cholesterol, Total: 122 mg/dL (ref 100–199)
HDL: 51 mg/dL (ref >39)
LDL CALC: 58 mg/dL (ref 0–99)
Triglycerides: 66 mg/dL (ref 0–149)
VLDL Cholesterol Cal: 13 mg/dL (ref 5–40)

## 2018-03-22 LAB — ALT: ALT: 39 IU/L (ref 0–44)

## 2018-04-27 ENCOUNTER — Encounter: Payer: Self-pay | Admitting: Cardiovascular Disease

## 2018-04-27 ENCOUNTER — Ambulatory Visit: Payer: Medicare Other | Admitting: Cardiovascular Disease

## 2018-04-27 VITALS — BP 132/66 | HR 59 | Ht 66.5 in | Wt 187.2 lb

## 2018-04-27 DIAGNOSIS — Z952 Presence of prosthetic heart valve: Secondary | ICD-10-CM

## 2018-04-27 DIAGNOSIS — I2510 Atherosclerotic heart disease of native coronary artery without angina pectoris: Secondary | ICD-10-CM | POA: Diagnosis not present

## 2018-04-27 NOTE — Progress Notes (Signed)
Cardiology Office Note:    Date:  04/27/2018   ID:  KOHL POLINSKY, DOB [birth year], MRN [id number]  PCP:  Lawerance Cruel, MD  Cardiologist:  No primary care provider on file.  Electrophysiologist:  None   Referring MD: Lawerance Cruel, MD   Chief Complaint  Patient presents with  . 1 year TAVR follow-up    History of Present Illness:    Douglas Lane is a 72 y.o. male with a hx of coronary artery disease and aortic stenosis.  He presents today for follow-up evaluation.  The patient was treated with TAVR in August 2018.  The patient was treated with a 26 mm Edwards sapien 3 valve via transfemoral approach with no postoperative complications.  The patient is here alone today.  He is been doing fine from a cardiac perspective.  He denies chest pain, shortness of breath, or leg swelling.  He has had no heart palpitations, lightheadedness, or syncope.  He is primarily limited by bilateral knee pain secondary to arthritis.  He has an appointment coming up with Dr. Wynelle Link to consider knee replacement surgery.  He has a trip planned in the near future to [place] and [place].  Past Medical History:  Diagnosis Date  . Aortic stenosis    severe AS s/p TAVR (03/22/17) with a 110mm Edwards Sapien 3 THV via the TF approach  . Arthritis    "pain in my knees" (03/30/2016)  . CAD S/P percutaneous coronary angioplasty 03/31/2016   a. CP/[place] -> LHC 03/31/16: 90% prox-mid RCA s/p DES, 40% prox-mid LAD, 25% D2, 25% prox-mid Cx, elevated LVEDP 33.  . Chronic diastolic CHF (congestive heart failure) ([place]) 03/31/2016  . Chronic lower back pain   . Dyslipidemia   . LVH (left ventricular hypertrophy)    a. severe basal septal hypertrophy by echo 03/2016.  . Mitral regurgitation    a. mild by echo 03/2016.  Marland Kitchen Psoriasis   . Rheumatic fever    as a child  . Seizure ([place]) 01/2011; 07/2011; 07/2015      "idiopathic; been on Kepra since 07/2011"  . Sinus bradycardia - baseline HR 50s at times.   . Sleep  apnea   . 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 Thrombosed hemorrhoids     Past Surgical History:  Procedure Laterality Date  . APPENDECTOMY    . BACK SURGERY    . CARDIAC CATHETERIZATION N/A 03/30/2016   Procedure: Right/Left Heart Cath and Coronary Angiography;  Surgeon: Jettie Booze, MD;  Location: [facility];  Service: Cardiovascular;  Laterality: N/A;  . CARDIAC CATHETERIZATION N/A 03/30/2016   Procedure: Coronary Stent Intervention;  Surgeon: Jettie Booze, MD;  Location: [facility];  Service: Cardiovascular;  Laterality: N/A;  . COLONOSCOPY    . CORONARY ANGIOPLASTY    . FOREARM FRACTURE SURGERY Right ~ 2003-2004   "got steel plate in there"  . I&D hemorrhoirds    . KNEE ARTHROSCOPY Bilateral   . MOHS SURGERY Left    temple  . ORIF SCAPULAR FRACTURE Right    right  . POSTERIOR LUMBAR FUSION  2004  . TEE WITHOUT CARDIOVERSION N/A 11/01/2016   Procedure: TRANSESOPHAGEAL ECHOCARDIOGRAM (TEE);  Surgeon: Sanda Klein, MD;  Location: [facility];  Service: Cardiovascular;  Laterality: N/A;  . TEE WITHOUT CARDIOVERSION N/A 03/22/2017   Procedure: TRANSESOPHAGEAL ECHOCARDIOGRAM (TEE);  Surgeon: Sherren Mocha, MD;  Location: [facility];  Service: Open Heart Surgery;  Laterality: N/A;  .  TONSILLECTOMY    . TRANSCATHETER AORTIC VALVE REPLACEMENT, TRANSFEMORAL N/A 03/22/2017   Procedure: TRANSCATHETER AORTIC VALVE REPLACEMENT, TRANSFEMORAL;  Surgeon: Sherren Mocha, MD;  Location: [facility];  Service: Open Heart Surgery;  Laterality: N/A;    Current Medications: Current Meds  Medication Sig  . acetaminophen (TYLENOL) 500 MG tablet Take 1,000 mg by mouth daily as needed for moderate pain.   Marland Kitchen amoxicillin (AMOXIL) 500 MG capsule TAKE FOUR CAPSULES BY MOUTH 1 HOUR BEFORE DENTAL APPOINTMENT  . aspirin 81 MG tablet Take 81 mg by mouth daily.  . calcium carbonate (TUMS - DOSED IN MG ELEMENTAL CALCIUM) 500 MG chewable tablet Chew 1 tablet by mouth daily.  Marland Kitchen  desonide (DESOWEN) 0.05 % cream Apply 1 application topically daily as needed (psoriasis).   . diphenhydrAMINE (BENADRYL) 25 mg capsule Take 25 mg by mouth daily as needed for allergies.  Marland Kitchen ezetimibe (ZETIA) 10 MG tablet Take 1 tablet (10 mg total) by mouth daily.  Marland Kitchen HUMIRA PEN 40 MG/0.8ML PNKT Inject 40 mg into the skin as directed. Every 5 weeks  . levETIRAcetam (KEPPRA) 750 MG tablet Take 1 tablet (750 mg total) by mouth 2 (two) times daily.  . nitroGLYCERIN (NITROSTAT) 0.4 MG SL tablet DISSOLVE 1 TABLET UNDER THE TONGUE EVERY 5 MINUTES AS NEEDED FOR CHEST PAIN( UP TO 3 DOSES)  . PROCTOZONE-HC 2.5 % rectal cream Apply 1 application topically daily as needed for hemorrhoids or itching.   . rosuvastatin (CRESTOR) 40 MG tablet Take 1 tablet (40 mg total) by mouth daily.     Allergies:   No known allergies   Social History   Socioeconomic History  . Marital status: Married    Spouse name: Not on file  . Number of children: Not on file  . Years of education: Not on file  . Highest education level: Not on file  Occupational History  . Not on file  Social Needs  . Financial resource strain: Not on file  . Food insecurity:    Worry: Not on file    Inability: Not on file  . Transportation needs:    Medical: Not on file    Non-medical: Not on file  Tobacco Use  . Smoking status: Never Smoker  . Smokeless tobacco: Never Used  Substance and Sexual Activity  . Alcohol use: Yes    Alcohol/week: 14.0 standard drinks    Types: 7 Shots of liquor, 7 Cans of beer per week  . Drug use: No  . Sexual activity: Not Currently  Lifestyle  . Physical activity:    Days per week: Not on file    Minutes per session: Not on file  . Stress: Not on file  Relationships  . Social connections:    Talks on phone: Not on file    Gets together: Not on file    Attends religious service: Not on file    Active member of club or organization: Not on file    Attends meetings of clubs or organizations: Not  on file    Relationship status: Not on file  Other Topics Concern  . Not on file  Social History Narrative  . Not on file     Family History: The patient's family history includes Congestive Heart Failure in his father; Depression in his sister; Renal Disease in his mother.  ROS:   Please see the history of present illness.    Positive for diarrhea, back pain, rash, snoring, joint pain.  All other systems reviewed and are  negative.  EKGs/Labs/Other Studies Reviewed:    The following studies were reviewed today: Echo 02-27-2018: Left ventricle:  The cavity size was normal. Wall thickness was increased in a pattern of severe LVH. Features are consistent with a pseudonormal left ventricular filling pattern, with concomitant abnormal relaxation and increased filling pressure (grade 2 diastolic dysfunction).  ------------------------------------------------------------------- Aortic valve:  There is an eccentric jet of aortic insufficiency that appears to be coming from a pericalvular location. A bioprosthesis was present.  Doppler:   There was no stenosis. There was mild regurgitation.    VTI ratio of LVOT to aortic valve: 0.44. Valve area (VTI): 1.38 cm^2. Indexed valve area (VTI): 0.69 cm^2/m^2. Peak velocity ratio of LVOT to aortic valve: 0.39. Valve area (Vmax): 1.22 cm^2. Indexed valve area (Vmax): 0.61 cm^2/m^2. Mean velocity ratio of LVOT to aortic valve: 0.43. Valve area (Vmean): 1.36 cm^2. Indexed valve area (Vmean): 0.68 cm^2/m^2. Mean gradient (S): 22 mm Hg. Peak gradient (S): 44 mm Hg.  ------------------------------------------------------------------- Aorta:  Aortic root: The aortic root was normal in size. Ascending aorta: The ascending aorta was normal in size.  ------------------------------------------------------------------- Mitral valve:   Structurally normal valve.   Leaflet separation was normal.  Doppler:  Transvalvular velocity was within the  normal range. There was no evidence for stenosis. There was no regurgitation.  ------------------------------------------------------------------- Left atrium:  The atrium was normal in size.  ------------------------------------------------------------------- Right ventricle:  The cavity size was normal. Systolic function was normal.  ------------------------------------------------------------------- Pulmonic valve:    The valve appears to be grossly normal. Doppler:  There was no significant regurgitation.  ------------------------------------------------------------------- Tricuspid valve:   Structurally normal valve.   Leaflet separation was normal.  Doppler:  Transvalvular velocity was within the normal range. There was no regurgitation.  ------------------------------------------------------------------- Right atrium:  The atrium was normal in size.  ------------------------------------------------------------------- Pericardium:  There was no pericardial effusion.  ------------------------------------------------------------------- Systemic veins: Inferior vena cava: The vessel was normal in size. The respirophasic diameter changes were in the normal range (= 50%), consistent with normal central venous pressure.  ------------------------------------------------------------------- Measurements   Left ventricle                            Value          Reference  LV ID, ED, PLAX chordal           (L)     40.2  mm       43 - 52  LV ID, ES, PLAX chordal                   27.5  mm       23 - 38  LV fx shortening, PLAX chordal            32    %        >=29  LV PW thickness, ED                       15.5  mm       ---------  IVS/LV PW ratio, ED                       1.19           <=1.3  Stroke volume, 2D                         88  ml       ---------  Stroke volume/bsa, 2D                     44    ml/m^2   ---------  LV e&', lateral                             8.77  cm/s     ---------  LV E/e&', lateral                          7.65           ---------  LV e&', medial                             3.9   cm/s     ---------  LV E/e&', medial                           17.21          ---------  LV e&', average                            6.34  cm/s     ---------  LV E/e&', average                          10.59          ---------    Ventricular septum                        Value          Reference  IVS thickness, ED                         18.5  mm       ---------    LVOT                                      Value          Reference  LVOT ID, S                                20    mm       ---------  LVOT area                                 3.14  cm^2     ---------  LVOT ID                                   20    mm       ---------  LVOT peak velocity, S                     129   cm/s     ---------  LVOT mean velocity, S  92.5  cm/s     ---------  LVOT VTI, S                               27.9  cm       ---------  LVOT peak gradient, S                     7     mm Hg    ---------  Stroke volume (SV), LVOT DP               87.7  ml       ---------  Stroke index (SV/bsa), LVOT DP            43.7  ml/m^2   ---------    Aortic valve                              Value          Reference  Aortic valve peak velocity, S             332   cm/s     ---------  Aortic valve mean velocity, S             214   cm/s     ---------  Aortic valve VTI, S                       63.6  cm       ---------  Aortic mean gradient, S                   22    mm Hg    ---------  Aortic peak gradient, S                   44    mm Hg    ---------  VTI ratio, LVOT/AV                        0.44           ---------  Aortic valve area, VTI                    1.38  cm^2     ---------  Aortic valve area/bsa, VTI                0.69  cm^2/m^2 ---------  Velocity ratio, peak, LVOT/AV             0.39           ---------  Aortic valve area, peak velocity           1.22  cm^2     ---------  Aortic valve area/bsa, peak               0.61  cm^2/m^2 ---------  velocity  Velocity ratio, mean, LVOT/AV             0.43           ---------  Aortic valve area, mean velocity          1.36  cm^2     ---------  Aortic valve area/bsa, mean               0.68  cm^2/m^2 ---------  velocity    Aorta  Value          Reference  Aortic root ID, ED                        37    mm       ---------  Ascending aorta ID, A-P, S                35    mm       ---------    Left atrium                               Value          Reference  LA ID, A-P, ES                            38    mm       ---------  LA ID/bsa, A-P                            1.89  cm/m^2   <=2.2  LA volume, ES, 1-p A4C                    48    ml       ---------  LA volume/bsa, ES, 1-p A4C                23.9  ml/m^2   ---------  LA volume, ES, 1-p A2C                    81    ml       ---------  LA volume/bsa, ES, 1-p A2C                40.3  ml/m^2   ---------    Mitral valve                              Value          Reference  Mitral E-wave peak velocity               67.1  cm/s     ---------  Mitral A-wave peak velocity               59.7  cm/s     ---------  Mitral deceleration time          (H)     391   ms       150 - 230  Mitral E/A ratio, peak                    1.1            ---------    Systemic veins                            Value          Reference  Estimated CVP                             3     mm Hg    ---------    Right ventricle  Value          Reference  RV s&', lateral, S                         12    cm/s     ---------   EKG:  EKG is not ordered today.   Recent Labs: 03/22/2018: ALT 39  Recent Lipid Panel    Component Value Date/Time   CHOL 122 03/22/2018 0732   TRIG 66 03/22/2018 0732   HDL 51 03/22/2018 0732   CHOLHDL 2.4 03/22/2018 0732   CHOLHDL 2.6 08/06/2016 0831   VLDL 16 08/06/2016 0831    LDLCALC 58 03/22/2018 0732    Physical Exam:    VS:  BP 132/66   Pulse (!) 59   Ht 5' 6.5" (1.689 m)   Wt 187 lb 3.2 oz (84.9 kg)   SpO2 96%   BMI 29.76 kg/m     Wt Readings from Last 3 Encounters:  04/27/18 187 lb 3.2 oz (84.9 kg)  01/19/18 185 lb (83.9 kg)  11/14/17 184 lb (83.5 kg)     GEN:  Well nourished, well developed in no acute distress HEENT: Normal NECK: No JVD; No carotid bruits LYMPHATICS: No lymphadenopathy CARDIAC: RRR, 2/6 SEM at the RUSB, no diastolic murmur RESPIRATORY:  Clear to auscultation without rales, wheezing or rhonchi  ABDOMEN: Soft, non-tender, non-distended MUSCULOSKELETAL:  No edema; No deformity  SKIN: Warm and dry NEUROLOGIC:  Alert and oriented x 3 PSYCHIATRIC:  Normal affect   ASSESSMENT:    1. S/P TAVR (transcatheter aortic valve replacement)   2. Coronary artery disease involving native coronary artery of native heart without angina pectoris      PLAN:    In order of problems listed above:  1. The patient's echo images are personally reviewed.  He has vigorous LV function.  The mean transvalvular aortic gradient is between 20 to 23 mmHg.  LVOT velocities are also modestly increased.  I suspect the increase in gradient is most likely flow related, but his previous echo had a mean gradient of 14 mmHg.  I recommended a repeat study in 6 months.  The patient's mild paravalvular regurgitation may be a little more prominent compared to his initial echo, but the location is unchanged and had appears in the mild range.  We discussed these findings at length today and he will continue on his current medical program.  He remains asymptomatic from a cardiac perspective and his echo will be repeated in 6 months.  He will continue with regular follow-up with Dr. Radford Pax.   Medication Adjustments/Labs and Tests Ordered: Current medicines are reviewed at length with the patient today.  Concerns regarding medicines are outlined above.  Orders Placed  This Encounter  Procedures  . ECHOCARDIOGRAM COMPLETE   No orders of the defined types were placed in this encounter.   Patient Instructions  Medication Instructions:  Your provider recommends that you continue on your current medications as directed. Please refer to the Current Medication list given to you today.    Labwork: None  Testing/Procedures: Your provider has requested that you have an echocardiogram in 6 months. Echocardiography is a painless test that uses sound waves to create images of your heart. It provides your doctor with information about the size and shape of your heart and how well your heart's chambers and valves are working. This procedure takes approximately one hour. There are no restrictions for this procedure. You are scheduled  for your echo on 10/26/2018 at 8:30AM. Please arrive by 8:00AM for this appointment.    Follow-Up: Your provider recommends that you schedule a follow-up appointment AS NEEDED with Dr.Braniya Farrugia.  Any Other Special Instructions Will Be Listed Below (If Applicable).     If you need a refill on your cardiac medications before your next appointment, please call your pharmacy.      Signed, Sherren Mocha, MD  04/27/2018 12:49 PM    [place]

## 2018-04-27 NOTE — Patient Instructions (Addendum)
Medication Instructions:  Your provider recommends that you continue on your current medications as directed. Please refer to the Current Medication list given to you today.    Labwork: None  Testing/Procedures: Your provider has requested that you have an echocardiogram in 6 months. Echocardiography is a painless test that uses sound waves to create images of your heart. It provides your doctor with information about the size and shape of your heart and how well your heart's chambers and valves are working. This procedure takes approximately one hour. There are no restrictions for this procedure. You are scheduled for your echo on 10/26/2018 at 8:30AM. Please arrive by 8:00AM for this appointment.    Follow-Up: Your provider recommends that you schedule a follow-up appointment AS NEEDED with Dr.Cooper.  Any Other Special Instructions Will Be Listed Below (If Applicable).     If you need a refill on your cardiac medications before your next appointment, please call your pharmacy.

## 2018-05-08 ENCOUNTER — Encounter: Payer: Self-pay | Admitting: Thoracic Surgery (Cardiothoracic Vascular Surgery)

## 2018-05-10 ENCOUNTER — Telehealth: Payer: Self-pay

## 2018-05-10 NOTE — Telephone Encounter (Signed)
   [facility] Pre-operative Risk Assessment    Request for surgical clearance:  1. What type of surgery is being performed?  R total knee   2. When is this surgery scheduled?  07/17/18   3. What type of clearance is required (medical clearance vs. Pharmacy clearance to hold med vs. Both)?  Medical  4. Are there any medications that need to be held prior to surgery and how long?    5. Practice name and name of physician performing surgery?  [facility]/ Dr Wynelle Link   6. What is your office phone number [phone]    7.   What is your office fax number [phone]  8.   Anesthesia type (None, local, MAC, general) ?  choice   Douglas Lane 05/10/2018, 3:39 PM  _________________________________________________________________   (provider comments below)

## 2018-05-11 NOTE — Telephone Encounter (Signed)
Dr. Burt Knack , I saw your note on Mr [place]well,  And I was clearing for Rt total knee, just to clarify, you do want SBE prophylaxis?  Thanks.

## 2018-05-12 MED ORDER — AMOXICILLIN 500 MG PO CAPS: ORAL_CAPSULE | ORAL | 3 refills | Status: DC

## 2018-05-12 NOTE — Telephone Encounter (Signed)
Called pt to make him aware that he has been cleared for his upcoming sx, Rt Total Knee, but he will need SBE Prophylaxis and to take Amoxicillin 500 mg and to take 4 1 hour before his surgery. Pt verbalized understanding.

## 2018-05-12 NOTE — Telephone Encounter (Signed)
   Primary Cardiologist: No primary care provider on file.  Chart reviewed as part of pre-operative protocol coverage. Patient was contacted 05/12/2018 in reference to pre-operative risk assessment for pending surgery as outlined below.  Douglas Lane was last seen on 04/27/18 by Dr. Burt Knack.  Pt is stable for total knee with his hx of CAD, TAVR in 2018.   Therefore, based on ACC/AHA guidelines, the patient would be at acceptable risk for the planned procedure without further cardiovascular testing.   He does need SBE prophylaxis.  Will call in amoxicillin for pt. 2 Gm prior to procedure.   I will route this recommendation to the requesting party via Epic fax function and remove from pre-op pool.  Please call with questions.  Cecilie Kicks, NP 05/12/2018, 4:22 PM

## 2018-05-12 NOTE — Addendum Note (Signed)
Addended by: Gaetano Net on: 05/12/2018 04:44 PM   Modules accepted: Orders

## 2018-05-16 ENCOUNTER — Encounter: Payer: Self-pay | Admitting: Cardiology

## 2018-05-16 ENCOUNTER — Ambulatory Visit: Payer: Medicare Other | Admitting: Cardiology

## 2018-05-16 VITALS — BP 128/62 | HR 73 | Ht 66.5 in | Wt 186.4 lb

## 2018-05-16 DIAGNOSIS — I350 Nonrheumatic aortic (valve) stenosis: Secondary | ICD-10-CM

## 2018-05-16 DIAGNOSIS — Z9861 Coronary angioplasty status: Secondary | ICD-10-CM

## 2018-05-16 DIAGNOSIS — E785 Hyperlipidemia, unspecified: Secondary | ICD-10-CM

## 2018-05-16 DIAGNOSIS — I5032 Chronic diastolic (congestive) heart failure: Secondary | ICD-10-CM | POA: Diagnosis not present

## 2018-05-16 DIAGNOSIS — I2510 Atherosclerotic heart disease of native coronary artery without angina pectoris: Secondary | ICD-10-CM | POA: Diagnosis not present

## 2018-05-16 DIAGNOSIS — I10 Essential (primary) hypertension: Secondary | ICD-10-CM

## 2018-05-16 NOTE — Patient Instructions (Signed)
Medication Instructions:  Your physician recommends that you continue on your current medications as directed. Please refer to the Current Medication list given to you today.  Follow-Up: Your physician wants you to follow-up in: 6 months with Dr. Radford Pax. You will receive a reminder letter in the mail two months in advance. If you don't receive a letter, please call our office to schedule the follow-up appointment.  If you need a refill on your cardiac medications before your next appointment, please call your pharmacy.

## 2018-05-16 NOTE — Progress Notes (Signed)
Cardiology Office Note:    Date:  05/16/2018   ID:  KARIN GRIFFITH, DOB [birth year], MRN [id number]  PCP:  Lawerance Cruel, MD  Cardiologist:  Fransico Him, MD    Referring MD: Lawerance Cruel, MD   Chief Complaint  Patient presents with  . Coronary Artery Disease  . Hypertension  . Hyperlipidemia  . Aortic Stenosis    History of Present Illness:    Douglas Lane is a 72 y.o. 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 Echo post TAVR showed normal LVF with G2DD with stable TAVR with AVA 2cm2, mean AVG 44mmHg and peak AVG 25mmHg. He is here today for followup and is doing well.  He denies any chest pain or pressure, SOB, DOE, PND, orthopnea, LE edema, dizziness, palpitations or syncope. He is compliant with his meds and is tolerating meds with no SE.    Past Medical History:  Diagnosis Date  . Aortic stenosis    severe AS s/p TAVR (03/22/17) with a 15mm Edwards Sapien 3 THV via the TF approach  . Arthritis    "pain in my knees" (03/30/2016)  . CAD S/P percutaneous coronary angioplasty 03/31/2016   a. CP/USA -> LHC 03/31/16: 90% prox-mid RCA s/p DES, 40% prox-mid LAD, 25% D2, 25% prox-mid Cx, elevated LVEDP 33.  . Chronic diastolic CHF (congestive heart failure) ([place]) 03/31/2016  . Chronic lower back pain   . Dyslipidemia   . LVH (left ventricular hypertrophy)    a. severe basal septal hypertrophy by echo 03/2016.  . Mitral regurgitation    a. mild by echo 03/2016.  Marland Kitchen Psoriasis   . Rheumatic fever    as a child  . Seizure ([place]) 01/2011; 07/2011; 07/2015      "idiopathic; been on Kepra since 07/2011"  . Sinus bradycardia - baseline HR 50s at times.   . Sleep apnea   . 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 Thrombosed hemorrhoids     Past Surgical History:  Procedure Laterality Date  . APPENDECTOMY    .  BACK SURGERY    . CARDIAC CATHETERIZATION N/A 03/30/2016   Procedure: Right/Left Heart Cath and Coronary Angiography;  Surgeon: Jettie Booze, MD;  Location: [facility];  Service: Cardiovascular;  Laterality: N/A;  . CARDIAC CATHETERIZATION N/A 03/30/2016   Procedure: Coronary Stent Intervention;  Surgeon: Jettie Booze, MD;  Location: [facility];  Service: Cardiovascular;  Laterality: N/A;  . COLONOSCOPY    . CORONARY ANGIOPLASTY    . FOREARM FRACTURE SURGERY Right ~ 2003-2004   "got steel plate in there"  . I&D hemorrhoirds    . KNEE ARTHROSCOPY Bilateral   . MOHS SURGERY Left    temple  . ORIF SCAPULAR FRACTURE Right    right  . POSTERIOR LUMBAR FUSION  2004  . TEE WITHOUT CARDIOVERSION N/A 11/01/2016   Procedure: TRANSESOPHAGEAL ECHOCARDIOGRAM (TEE);  Surgeon: Sanda Klein, MD;  Location: [facility];  Service: Cardiovascular;  Laterality: N/A;  . TEE WITHOUT CARDIOVERSION N/A 03/22/2017   Procedure: TRANSESOPHAGEAL ECHOCARDIOGRAM (TEE);  Surgeon: Sherren Mocha, MD;  Location: [facility];  Service: Open Heart Surgery;  Laterality: N/A;  . TONSILLECTOMY    . TRANSCATHETER AORTIC VALVE REPLACEMENT, TRANSFEMORAL N/A 03/22/2017   Procedure: TRANSCATHETER AORTIC VALVE REPLACEMENT, TRANSFEMORAL;  Surgeon: Sherren Mocha, MD;  Location: [facility];  Service: Open Heart Surgery;  Laterality: N/A;    Current Medications: Current Meds  Medication Sig  . acetaminophen (TYLENOL) 500 MG tablet Take 1,000 mg by mouth daily as needed for moderate pain.   Marland Kitchen amoxicillin (AMOXIL) 500 MG capsule TAKE FOUR CAPSULES BY MOUTH 1 HOUR BEFORE YOUR SURGERY  . aspirin 81 MG tablet Take 81 mg by mouth daily.  . calcium carbonate (TUMS - DOSED IN MG ELEMENTAL CALCIUM) 500 MG chewable tablet Chew 1 tablet by mouth daily.  Marland Kitchen desonide (DESOWEN) 0.05 % cream Apply 1 application topically daily as needed (psoriasis).   . diphenhydrAMINE (BENADRYL) 25 mg capsule Take 25 mg by mouth daily as needed  for allergies.  Marland Kitchen ezetimibe (ZETIA) 10 MG tablet Take 1 tablet (10 mg total) by mouth daily.  Marland Kitchen HUMIRA PEN 40 MG/0.8ML PNKT Inject 40 mg into the skin as directed. Every 5 weeks  . levETIRAcetam (KEPPRA) 750 MG tablet Take 1 tablet (750 mg total) by mouth 2 (two) times daily.  . nitroGLYCERIN (NITROSTAT) 0.4 MG SL tablet DISSOLVE 1 TABLET UNDER THE TONGUE EVERY 5 MINUTES AS NEEDED FOR CHEST PAIN( UP TO 3 DOSES)  . PROCTOZONE-HC 2.5 % rectal cream Apply 1 application topically daily as needed for hemorrhoids or itching.   . rosuvastatin (CRESTOR) 40 MG tablet Take 1 tablet (40 mg total) by mouth daily.     Allergies:   No known allergies and Other   Social History   Socioeconomic History  . Marital status: Married    Spouse name: Not on file  . Number of children: Not on file  . Years of education: Not on file  . Highest education level: Not on file  Occupational History  . Not on file  Social Needs  . Financial resource strain: Not on file  . Food insecurity:    Worry: Not on file    Inability: Not on file  . Transportation needs:    Medical: Not on file    Non-medical: Not on file  Tobacco Use  . Smoking status: Never Smoker  . Smokeless tobacco: Never Used  Substance and Sexual Activity  . Alcohol use: Yes    Alcohol/week: 14.0 standard drinks    Types: 7 Shots of liquor, 7 Cans of beer per week  . Drug use: No  . Sexual activity: Not Currently  Lifestyle  . Physical activity:    Days per week: Not on file    Minutes per session: Not on file  . Stress: Not on file  Relationships  . Social connections:    Talks on phone: Not on file    Gets together: Not on file    Attends religious service: Not on file    Active member of club or organization: Not on file    Attends meetings of clubs or organizations: Not on file    Relationship status: Not on file  Other Topics Concern  . Not on file  Social History Narrative  . Not on file     Family History: The  patient's family history includes Congestive Heart Failure in his father; Depression in his sister; Renal Disease in his mother.  ROS:   Please see the history of present illness.    ROS  All other systems reviewed and negative.   EKGs/Labs/Other Studies Reviewed:    The following studies were reviewed today: none  EKG:  EKG is  ordered today.  The ekg ordered  today demonstrates normal sinus rhythm at 73 bpm with occasional PVCs, LVH by voltage criteria with repolarization abnormality and anterior septal infarct  Recent Labs: 03/22/2018: ALT 39   Recent Lipid Panel    Component Value Date/Time   CHOL 122 03/22/2018 0732   TRIG 66 03/22/2018 0732   HDL 51 03/22/2018 0732   CHOLHDL 2.4 03/22/2018 0732   CHOLHDL 2.6 08/06/2016 0831   VLDL 16 08/06/2016 0831   LDLCALC 58 03/22/2018 0732    Physical Exam:    VS:  BP 128/62   Pulse 73   Ht 5' 6.5" (1.689 m)   Wt 186 lb 6.4 oz (84.6 kg)   BMI 29.64 kg/m     Wt Readings from Last 3 Encounters:  05/16/18 186 lb 6.4 oz (84.6 kg)  04/27/18 187 lb 3.2 oz (84.9 kg)  01/19/18 185 lb (83.9 kg)     GEN:  Well nourished, well developed in no acute distress HEENT: Normal NECK: No JVD; No carotid bruits LYMPHATICS: No lymphadenopathy CARDIAC: RRR, no  rubs, gallops.  2/6 SM at RUSB to [place].  Occasional ectopy RESPIRATORY:  Clear to auscultation without rales, wheezing or rhonchi  ABDOMEN: Soft, non-tender, non-distended MUSCULOSKELETAL:  No edema; No deformity  SKIN: Warm and dry NEUROLOGIC:  Alert and oriented x 3 PSYCHIATRIC:  Normal affect   ASSESSMENT:    1. CAD S/P percutaneous coronary angioplasty   2. Severe aortic stenosis   3. Chronic diastolic heart failure ([place])   4. Benign essential HTN   5. Dyslipidemia    PLAN:    In order of problems listed above:  1.  ASCAD - s/p DES to RCA (03/2016)and 40% prox LAD and 50% ostial PDA.  He denies any anginal symptoms.  He will continue on aspirin 81 mg daily and statin  therapy.  2.  Severe AS -  s/p TAVR (03/22/17) with a76mm Edwards Sapien 3 THV via the TF approach.  Echo July 2019 showed normal LV function with normal functioning TAVR with mean gradient slightly elevated at 22 mmHg (45mmHg on prior echo) with mild perivalvular leak.  He will continue on aspirin 81 mg daily.  He has a repeat echo pending in January 2020 to reassess prosthetic valve gradient.  3.  Chronic diastolic CHF -he appears euvolemic on exam today.  His weight is stable.  He has not required diuretic therapy recently.  4.  HTN - BP is well controlled on exam today.  He has not required antihypertensive therapy recently  5.  Hyperlipidemia with LDL goal < 70.  His last LDL was 58 with an ALT of 39 on 03/22/2018.  He will continue on Zetia 10 mg daily Crestor 40 mg daily.   Medication Adjustments/Labs and Tests Ordered: Current medicines are reviewed at length with the patient today.  Concerns regarding medicines are outlined above.  Orders Placed This Encounter  Procedures  . EKG 12-Lead   No orders of the defined types were placed in this encounter.   Signed, Fransico Him, MD  05/16/2018 9:10 AM    [place]

## 2018-07-12 ENCOUNTER — Encounter (HOSPITAL_COMMUNITY): Payer: Self-pay

## 2018-07-12 NOTE — Pre-Procedure Instructions (Signed)
Dr. Harrington Challenger clearance 05/10/2018 in chart  The following are in epic: EKG 05/16/2018 Dr. Delice Lesch 01/19/2018 last office visit note Dr. Radford Pax 05/16/2018 last office visit note ECHO 02/27/2018

## 2018-07-12 NOTE — Patient Instructions (Addendum)
Your procedure is scheduled on:  Monday, Dec. 9, 2019   Surgery Time: 09;25 AM-10:15AM   Report to [facility]  Entrance    Report to admitting at 6:45 AM   Call this number if you have problems the morning of surgery [phone]   Do not eat food or drink liquids :After Midnight.   Brush your teeth the morning of surgery.   Do NOT smoke after Midnight   Take these medicines the morning of surgery with A SIP OF WATER: Zetia, Keppra                               You may not have any metal on your body including jewelry, and body piercings             Do not wear lotions, powders, perfumes/cologne, or deodorant                           Men may shave face and neck.   Do not bring valuables to the hospital. [address].   Contacts, dentures or bridgework may not be worn into surgery.   Leave suitcase in the car. After surgery it may be brought to your room.    Special Instructions: Bring a copy of your healthcare power of attorney and living will documents         the day of surgery if you haven't scanned them in before.              Please read over the following fact sheets you were given:  [facility] - Preparing for Surgery Before surgery, you can play an important role.  Because skin is not sterile, your skin needs to be as free of germs as possible.  You can reduce the number of germs on your skin by washing with CHG (chlorahexidine gluconate) soap before surgery.  CHG is an antiseptic cleaner which kills germs and bonds with the skin to continue killing germs even after washing. Please DO NOT use if you have an allergy to CHG or antibacterial soaps.  If your skin becomes reddened/irritated stop using the CHG and inform your nurse when you arrive at Short Stay. Do not shave (including legs and underarms) for at least 48 hours prior to the first CHG shower.  You may shave your face/neck.  Please follow these  instructions carefully:  1.  Shower with CHG Soap the night before surgery and the  morning of surgery.  2.  If you choose to wash your hair, wash your hair first as usual with your normal  shampoo.  3.  After you shampoo, rinse your hair and body thoroughly to remove the shampoo.                             4.  Use CHG as you would any other liquid soap.  You can apply chg directly to the skin and wash.  Gently with a scrungie or clean washcloth.  5.  Apply the CHG Soap to your body ONLY FROM THE NECK DOWN.   Do   not use on face/ open  Wound or open sores. Avoid contact with eyes, ears mouth and   genitals (private parts).                       Wash face,  Genitals (private parts) with your normal soap.             6.  Wash thoroughly, paying special attention to the area where your    surgery  will be performed.  7.  Thoroughly rinse your body with warm water from the neck down.  8.  DO NOT shower/wash with your normal soap after using and rinsing off the CHG Soap.                9.  Pat yourself dry with a clean towel.            10.  Wear clean pajamas.            11.  Place clean sheets on your bed the night of your first shower and do not  sleep with pets. Day of Surgery : Do not apply any lotions/deodorants the morning of surgery.  Please wear clean clothes to the hospital/surgery center.  FAILURE TO FOLLOW THESE INSTRUCTIONS MAY RESULT IN THE CANCELLATION OF YOUR SURGERY  PATIENT SIGNATURE_________________________________  NURSE SIGNATURE__________________________________  ________________________________________________________________________   Douglas Lane  An incentive spirometer is a tool that can help keep your lungs clear and active. This tool measures how well you are filling your lungs with each breath. Taking long deep breaths may help reverse or decrease the chance of developing breathing (pulmonary) problems (especially infection)  following:  A long period of time when you are unable to move or be active. BEFORE THE PROCEDURE   If the spirometer includes an indicator to show your best effort, your nurse or respiratory therapist will set it to a desired goal.  If possible, sit up straight or lean slightly forward. Try not to slouch.  Hold the incentive spirometer in an upright position. INSTRUCTIONS FOR USE  1. Sit on the edge of your bed if possible, or sit up as far as you can in bed or on a chair. 2. Hold the incentive spirometer in an upright position. 3. Breathe out normally. 4. Place the mouthpiece in your mouth and seal your lips tightly around it. 5. Breathe in slowly and as deeply as possible, raising the piston or the ball toward the top of the column. 6. Hold your breath for 3-5 seconds or for as long as possible. Allow the piston or ball to fall to the bottom of the column. 7. Remove the mouthpiece from your mouth and breathe out normally. 8. Rest for a few seconds and repeat Steps 1 through 7 at least 10 times every 1-2 hours when you are awake. Take your time and take a few normal breaths between deep breaths. 9. The spirometer may include an indicator to show your best effort. Use the indicator as a goal to work toward during each repetition. 10. After each set of 10 deep breaths, practice coughing to be sure your lungs are clear. If you have an incision (the cut made at the time of surgery), support your incision when coughing by placing a pillow or rolled up towels firmly against it. Once you are able to get out of bed, walk around indoors and cough well. You may stop using the incentive spirometer when instructed by your caregiver.  RISKS AND COMPLICATIONS  Take your time  so you do not get dizzy or light-headed.  If you are in pain, you may need to take or ask for pain medication before doing incentive spirometry. It is harder to take a deep breath if you are having pain. AFTER USE  Rest and  breathe slowly and easily.  It can be helpful to keep track of a log of your progress. Your caregiver can provide you with a simple table to help with this. If you are using the spirometer at home, follow these instructions: [place] IF:   You are having difficultly using the spirometer.  You have trouble using the spirometer as often as instructed.  Your pain medication is not giving enough relief while using the spirometer.  You develop fever of 100.5 F (38.1 C) or higher. SEEK IMMEDIATE MEDICAL CARE IF:   You cough up bloody sputum that had not been present before.  You develop fever of 102 F (38.9 C) or greater.  You develop worsening pain at or near the incision site. MAKE SURE YOU:   Understand these instructions.  Will watch your condition.  Will get help right away if you are not doing well or get worse. Document Released: 12/13/2006 Document Revised: 10/25/2011 Document Reviewed: 02/13/2007 ExitCare Patient Information 2014 Exit[employer], [place].   ________________________________________________________________________  WHAT IS A BLOOD TRANSFUSION? Blood Transfusion Information  A transfusion is the replacement of blood or some of its parts. Blood is made up of multiple cells which provide different functions.  Red blood cells carry oxygen and are used for blood loss replacement.  White blood cells fight against infection.  Platelets control bleeding.  Plasma helps clot blood.  Other blood products are available for specialized needs, such as hemophilia or other clotting disorders. BEFORE THE TRANSFUSION  Who gives blood for transfusions?   Healthy volunteers who are fully evaluated to make sure their blood is safe. This is blood bank blood. Transfusion therapy is the safest it has ever been in the practice of medicine. Before blood is taken from a donor, a complete history is taken to make sure that person has no history of diseases nor engages in  risky social behavior (examples are intravenous drug use or sexual activity with multiple partners). The donor's travel history is screened to minimize risk of transmitting infections, such as malaria. The donated blood is tested for signs of infectious diseases, such as HIV and hepatitis. The blood is then tested to be sure it is compatible with you in order to minimize the chance of a transfusion reaction. If you or a relative donates blood, this is often done in anticipation of surgery and is not appropriate for emergency situations. It takes many days to process the donated blood. RISKS AND COMPLICATIONS Although transfusion therapy is very safe and saves many lives, the main dangers of transfusion include:   Getting an infectious disease.  Developing a transfusion reaction. This is an allergic reaction to something in the blood you were given. Every precaution is taken to prevent this. The decision to have a blood transfusion has been considered carefully by your caregiver before blood is given. Blood is not given unless the benefits outweigh the risks. AFTER THE TRANSFUSION  Right after receiving a blood transfusion, you will usually feel much better and more energetic. This is especially true if your red blood cells have gotten low (anemic). The transfusion raises the level of the red blood cells which carry oxygen, and this usually causes an energy increase.  The  nurse administering the transfusion will monitor you carefully for complications. HOME CARE INSTRUCTIONS  No special instructions are needed after a transfusion. You may find your energy is better. Speak with your caregiver about any limitations on activity for underlying diseases you may have. SEEK MEDICAL CARE IF:   Your condition is not improving after your transfusion.  You develop redness or irritation at the intravenous (IV) site. SEEK IMMEDIATE MEDICAL CARE IF:  Any of the following symptoms occur over the next 12  hours:  Shaking chills.  You have a temperature by mouth above 102 F (38.9 C), not controlled by medicine.  Chest, back, or muscle pain.  People around you feel you are not acting correctly or are confused.  Shortness of breath or difficulty breathing.  Dizziness and fainting.  You get a rash or develop hives.  You have a decrease in urine output.  Your urine turns a dark color or changes to pink, red, or brown. Any of the following symptoms occur over the next 10 days:  You have a temperature by mouth above 102 F (38.9 C), not controlled by medicine.  Shortness of breath.  Weakness after normal activity.  The white part of the eye turns yellow (jaundice).  You have a decrease in the amount of urine or are urinating less often.  Your urine turns a dark color or changes to pink, red, or brown. Document Released: 07/30/2000 Document Revised: 10/25/2011 Document Reviewed: 03/18/2008 [facility] Patient Information 2014 [place], [place].  _______________________________________________________________________

## 2018-07-18 ENCOUNTER — Telehealth: Payer: Self-pay | Admitting: *Deleted

## 2018-07-18 ENCOUNTER — Other Ambulatory Visit: Payer: Self-pay

## 2018-07-18 ENCOUNTER — Encounter (HOSPITAL_COMMUNITY): Payer: Self-pay

## 2018-07-18 ENCOUNTER — Encounter (HOSPITAL_COMMUNITY)
Admission: RE | Admit: 2018-07-18 | Discharge: 2018-07-18 | Disposition: A | Discharge status: Home / Self Care | Payer: Medicare Other | Source: Ambulatory Visit | Attending: Orthopedic Surgery | Admitting: Orthopedic Surgery

## 2018-07-18 DIAGNOSIS — M1711 Unilateral primary osteoarthritis, right knee: Secondary | ICD-10-CM | POA: Diagnosis not present

## 2018-07-18 DIAGNOSIS — Z01818 Encounter for other preprocedural examination: Secondary | ICD-10-CM | POA: Insufficient documentation

## 2018-07-18 HISTORY — DX: Heartburn: R12

## 2018-07-18 HISTORY — DX: Cyst of kidney, acquired: N28.1

## 2018-07-18 HISTORY — DX: Unspecified fracture of shaft of humerus, unspecified arm, initial encounter for closed fracture: S42.309A

## 2018-07-18 HISTORY — DX: Unspecified hearing loss, unspecified ear: H91.90

## 2018-07-18 HISTORY — DX: Other specified disorders of bone density and structure, unspecified site: M85.80

## 2018-07-18 HISTORY — DX: Atherosclerosis of aorta: I70.0

## 2018-07-18 HISTORY — DX: Unspecified cataract: H26.9

## 2018-07-18 HISTORY — DX: Other ill-defined heart diseases: I51.89

## 2018-07-18 HISTORY — DX: Heart disease, unspecified: I51.9

## 2018-07-18 HISTORY — DX: Scoliosis, unspecified: M41.9

## 2018-07-18 HISTORY — DX: Other intervertebral disc degeneration, lumbar region without mention of lumbar back pain or lower extremity pain: M51.369

## 2018-07-18 HISTORY — DX: Other intervertebral disc degeneration, lumbar region: M51.36

## 2018-07-18 LAB — SURGICAL PCR SCREEN
MRSA, PCR: NEGATIVE
Staphylococcus aureus: NEGATIVE

## 2018-07-18 LAB — COMPREHENSIVE METABOLIC PANEL
ALBUMIN: 4.2 g/dL (ref 3.5–5.0)
ALT: 36 U/L (ref 0–44)
AST: 39 U/L (ref 15–41)
Alkaline Phosphatase: 65 U/L (ref 38–126)
Anion gap: 6 (ref 5–15)
BUN: 16 mg/dL (ref 8–23)
CHLORIDE: 105 mmol/L (ref 98–111)
CO2: 28 mmol/L (ref 22–32)
CREATININE: 0.82 mg/dL (ref 0.61–1.24)
Calcium: 9.1 mg/dL (ref 8.9–10.3)
GFR calc Af Amer: >60 mL/min (ref >60)
GLUCOSE: 96 mg/dL (ref 70–99)
POTASSIUM: 4.5 mmol/L (ref 3.5–5.1)
SODIUM: 139 mmol/L (ref 135–145)
Total Bilirubin: 1.8 mg/dL — ABNORMAL HIGH (ref 0.3–1.2)
Total Protein: 6.3 g/dL — ABNORMAL LOW (ref 6.5–8.1)

## 2018-07-18 LAB — CBC
HCT: 46.9 % (ref 39.0–52.0)
Hemoglobin: 14.9 g/dL (ref 13.0–17.0)
MCH: 30.1 pg (ref 26.0–34.0)
MCHC: 31.8 g/dL (ref 30.0–36.0)
MCV: 94.7 fL (ref 80.0–100.0)
NRBC: 0 % (ref 0.0–0.2)
PLATELETS: 155 10*3/uL (ref 150–400)
RBC: 4.95 MIL/uL (ref 4.22–5.81)
RDW: 13 % (ref 11.5–15.5)
WBC: 5.3 10*3/uL (ref 4.0–10.5)

## 2018-07-18 LAB — PROTIME-INR
INR: 0.95
PROTHROMBIN TIME: 12.6 s (ref 11.4–15.2)

## 2018-07-18 LAB — ABO/RH: ABO/RH(D): O POS

## 2018-07-18 LAB — APTT: aPTT: 31 seconds (ref 24–36)

## 2018-07-18 NOTE — H&P (Signed)
TOTAL KNEE ADMISSION H&P  Patient is being admitted for right total knee arthroplasty.  Subjective:  Chief Complaint:right knee pain.  HPI: Douglas Lane, 72 y.o. male, has a history of pain and functional disability in the right knee due to arthritis and has failed non-surgical conservative treatments for greater than 12 weeks to includecorticosteriod injections, viscosupplementation injections, use of assistive devices and activity modification.  Onset of symptoms was gradual, starting several years ago with gradually worsening course since that time. The patient noted no past surgery on the right knee(s).  Patient currently rates pain in the right knee(s) at 10 out of 10 with activity. Patient has worsening of pain with activity and weight bearing and crepitus.  Patient has evidence of bone-on-bone arthritis medial and patellofemoral compartments with significant tibial subluxation by imaging studies.There is no active infection.  Patient Active Problem List   Diagnosis Date Noted  . Chronic diastolic heart failure ([place]) 11/13/2017  . Benign essential HTN 11/13/2017  . S/P TAVR (transcatheter aortic valve replacement) 03/22/2017  . Severe aortic stenosis 03/22/2017  . Bradycardia, drug induced 10/28/2016  . Dyslipidemia 03/31/2016  . Mitral regurgitation 03/31/2016  . CAD S/P percutaneous coronary angioplasty 03/31/2016  . Abnormal nuclear stress test   . Heart murmur 03/06/2015  . Abnormal EKG 03/06/2015  . LVH (left ventricular hypertrophy) 03/06/2015  . Rheumatic fever   . Localization-related idiopathic epilepsy and epileptic syndromes with seizures of localized onset, not intractable, without status epilepticus ([place]) 08/23/2014  . Hemorrhoid, thrombosed. 12/24/2011   Past Medical History:  Diagnosis Date  . Aortic atherosclerosis ([place]) 03/08/2017   CT  . Aortic stenosis    severe AS s/p TAVR (03/22/17) with a 39mm Edwards Sapien 3 THV via the TF approach  . Arthritis    "pain in my knees" (03/30/2016)  . Bilateral cataracts   . CAD S/P percutaneous coronary angioplasty 03/31/2016   a. CP/USA -> LHC 03/31/16: 90% prox-mid RCA s/p DES, 40% prox-mid LAD, 25% D2, 25% prox-mid Cx, elevated LVEDP 33.  . Chronic diastolic CHF (congestive heart failure) ([place]) 03/31/2016   pt unaware, has family history  . Chronic lower back pain   . DDD (degenerative disc disease), lumbar   . Dyslipidemia   . Grade II diastolic dysfunction   . Hearing loss   . Heart murmur   . Heartburn    occ. takes TUMS  . Humeral fracture 07/2014   Right  . Kidney cysts    1.6 cm simple cyst right  . LVH (left ventricular hypertrophy)    a. severe basal septal hypertrophy by echo 03/2016.  . Mitral regurgitation    a. mild by echo 03/2016.  . Osteopenia 03/13/2009  . Psoriasis   . Rheumatic fever    as a child  . Scoliosis 03/13/2009  . Seizure ([place]) 01/2011; 07/2011; 07/2015   "idiopathic; been on Kepra since 07/2011", last seizure poss. 07/2015, absent seizure  . Sinus bradycardia - baseline HR 50s at times.   . Sleep apnea    never had sleep study done, pt. unaware  . 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 Thrombosed hemorrhoids     Past Surgical History:  Procedure Laterality Date  . APPENDECTOMY    . BACK SURGERY    . CARDIAC CATHETERIZATION N/A 03/30/2016   Procedure: Right/Left Heart Cath and Coronary Angiography;  Surgeon: Jettie Booze, MD;  Location: [facility];  Service: Cardiovascular;  Laterality: N/A;  . CARDIAC CATHETERIZATION  N/A 03/30/2016   Procedure: Coronary Stent Intervention;  Surgeon: Jettie Booze, MD;  Location: [facility];  Service: Cardiovascular;  Laterality: N/A;  . CATARACT EXTRACTION, BILATERAL    . COLONOSCOPY    . CORONARY ANGIOPLASTY    . FOREARM FRACTURE SURGERY Right ~ 2003-2004   "got steel plate in there"  . HERNIA REPAIR  2009  . I&D hemorrhoirds    . KNEE ARTHROSCOPY Bilateral   . MOHS SURGERY Left      temple  . ORIF SCAPULAR FRACTURE Right    right  . POSTERIOR LUMBAR FUSION  2004  . TEE WITHOUT CARDIOVERSION N/A 11/01/2016   Procedure: TRANSESOPHAGEAL ECHOCARDIOGRAM (TEE);  Surgeon: Sanda Klein, MD;  Location: [facility];  Service: Cardiovascular;  Laterality: N/A;  . TEE WITHOUT CARDIOVERSION N/A 03/22/2017   Procedure: TRANSESOPHAGEAL ECHOCARDIOGRAM (TEE);  Surgeon: Sherren Mocha, MD;  Location: [facility];  Service: Open Heart Surgery;  Laterality: N/A;  . TONSILLECTOMY    . TRANSCATHETER AORTIC VALVE REPLACEMENT, TRANSFEMORAL N/A 03/22/2017   Procedure: TRANSCATHETER AORTIC VALVE REPLACEMENT, TRANSFEMORAL;  Surgeon: Sherren Mocha, MD;  Location: [facility];  Service: Open Heart Surgery;  Laterality: N/A;    No current facility-administered medications for this encounter.    Current Outpatient Medications  Medication Sig Dispense Refill Last Dose  . acetaminophen (TYLENOL) 500 MG tablet Take 1,000 mg by mouth every 6 (six) hours as needed for moderate pain or headache.    Taking  . amoxicillin (AMOXIL) 500 MG capsule TAKE FOUR CAPSULES BY MOUTH 1 HOUR BEFORE YOUR SURGERY (Patient taking differently: Take 2,000 mg by mouth See admin instructions. Take 2000 mg by mouth 1 hour to procedure) 4 capsule 3 Taking  . aspirin 81 MG tablet Take 81 mg by mouth daily.   07/17/2018 at Unknown time  . calcium carbonate (TUMS - DOSED IN MG ELEMENTAL CALCIUM) 500 MG chewable tablet Chew 1 tablet by mouth daily as needed for heartburn.    Taking  . clobetasol (TEMOVATE) 0.05 % external solution Apply 1 application topically 2 (two) times daily as needed (for psoriasis).     . clobetasol ointment (TEMOVATE) 7.98 % Apply 1 application topically 2 (two) times daily as needed (for psoriasis).     Marland Kitchen desonide (DESOWEN) 0.05 % ointment Apply 1 application topically daily as needed (psoriasis).    Taking  . diphenhydrAMINE (BENADRYL) 25 mg capsule Take 25 mg by mouth daily as needed for allergies.   Taking  .  ezetimibe (ZETIA) 10 MG tablet Take 1 tablet (10 mg total) by mouth daily. 30 tablet 11 Taking  . HUMIRA PEN 40 MG/0.8ML PNKT Inject 40 mg into the skin See admin instructions. Inject 40 mg SQ every 5-6 weeks   Taking  . levETIRAcetam (KEPPRA) 750 MG tablet Take 1 tablet (750 mg total) by mouth 2 (two) times daily. 180 tablet 3 Taking  . nitroGLYCERIN (NITROSTAT) 0.4 MG SL tablet DISSOLVE 1 TABLET UNDER THE TONGUE EVERY 5 MINUTES AS NEEDED FOR CHEST PAIN( UP TO 3 DOSES) (Patient taking differently: Place 0.4 mg under the tongue every 5 (five) minutes as needed for chest pain. ) 25 tablet 5 Taking  . PROCTOZONE-HC 2.5 % rectal cream Apply 1 application topically daily as needed for hemorrhoids or itching.    Taking  . rosuvastatin (CRESTOR) 40 MG tablet Take 1 tablet (40 mg total) by mouth daily. 90 tablet 3 Taking  . shark liver oil-cocoa butter (PREPARATION H) 0.25-3-85.5 % suppository Place 1 suppository rectally  as needed for hemorrhoids.      No Known Allergies  Social History   Tobacco Use  . Smoking status: Never Smoker  . Smokeless tobacco: Never Used  Substance Use Topics  . Alcohol use: Yes    Alcohol/week: 14.0 standard drinks    Types: 7 Shots of liquor, 7 Cans of beer per week    Family History  Problem Relation Age of Onset  . Renal Disease Mother   . Congestive Heart Failure Father   . Depression Sister      Review of Systems  Constitutional: Negative for chills and fever.  HENT: Negative for congestion, sore throat and tinnitus.   Eyes: Negative for double vision, photophobia and pain.  Respiratory: Negative for cough, shortness of breath and wheezing.   Cardiovascular: Negative for chest pain, palpitations and orthopnea.  Gastrointestinal: Negative for heartburn, nausea and vomiting.  Genitourinary: Negative for dysuria, frequency and urgency.  Musculoskeletal: Positive for joint pain.  Neurological: Negative for dizziness, weakness and headaches.     Objective:  Physical Exam  Well nourished and well developed. General: Alert and oriented x3, cooperative and pleasant, no acute distress. Head: normocephalic, atraumatic, neck supple. Eyes: EOMI. Respiratory: breath sounds clear in all fields, no wheezing, rales, or rhonchi. Cardiovascular: Regular rate and rhythm, no murmurs, gallops or rubs.  Abdomen: non-tender to palpation and soft, normoactive bowel sounds. Musculoskeletal: Right Knee Exam: No effusion. No Swelling. Range of motion is 0-120 degrees. Moderate crepitus on range of motion of the knee. Significant varus. Tenderness medial joint line.more than lateral joint line. Stable knee. Some pseudolaxity when I correct his varus deformity back to neutral but his MCL's are intact.  Left Knee Exam: No effusion. No Swelling.Range of motion is 0-130 degrees. Moderate crepitus on range of motion of the knee. Somewhat tender medial joint line. No lateral joint line tenderness. Stable knee. Some pseudolaxity when I correct his varus deformity back to neutral but his MCL's are intact.  Bilateral Hip Exam: ROM: Normal range of motion without discomfort. There is no tenderness over the greater trochanter bursa. There is no pain on provocative testing of the hip. Calves soft and nontender. Motor function intact in LE. Strength 5/5 LE bilaterally. Neuro: Distal pulses 2+. Sensation to light touch intact in LE.  Vital signs in last 24 hours: Temp:  [97.4 F (36.3 C)] 97.4 F (36.3 C) (12/03 0849) Pulse Rate:  [55] 55 (12/03 0849) Resp:  [16] 16 (12/03 0849) BP: (177)/(87) 177/87 (12/03 0849) SpO2:  [97 %] 97 % (12/03 0849) Weight:  [86.6 kg] 86.6 kg (12/03 0849)  Labs:   Estimated body mass index is 30.83 kg/m as calculated from the following:   Height as of 07/18/18: 5\' 6"  (1.676 m).   Weight as of 07/18/18: 86.6 kg.   Imaging Review Plain radiographs demonstrate severe degenerative joint disease of the right knee(s). The overall  alignment issignificant varus. The bone quality appears to be adequate for age and reported activity level.   Preoperative templating of the joint replacement has been completed, documented, and submitted to the Operating Room personnel in order to optimize intra-operative equipment management.   Anticipated LOS equal to or greater than 2 midnights due to - Age 28 and older with one or more of the following:  - Obesity  - Expected need for hospital services (PT, OT, Nursing) required for safe  discharge  - Anticipated need for postoperative skilled nursing care or inpatient rehab  - Active co-morbidities: Coronary Artery  Disease OR   - Unanticipated findings during/Post Surgery: None  - Patient is a high risk of re-admission due to: None     Assessment/Plan:  End stage arthritis, right knee   The patient history, physical examination, clinical judgment of the provider and imaging studies are consistent with end stage degenerative joint disease of the right knee(s) and total knee arthroplasty is deemed medically necessary. The treatment options including medical management, injection therapy arthroscopy and arthroplasty were discussed at length. The risks and benefits of total knee arthroplasty were presented and reviewed. The risks due to aseptic loosening, infection, stiffness, patella tracking problems, thromboembolic complications and other imponderables were discussed. The patient acknowledged the explanation, agreed to proceed with the plan and consent was signed. Patient is being admitted for inpatient treatment for surgery, pain control, PT, OT, prophylactic antibiotics, VTE prophylaxis, progressive ambulation and ADL's and discharge planning. The patient is planning to be discharged home with outpatient physical therapy.   Therapy Plans: outpatient therapy at [facility]) Disposition: Home with wife Planned DVT Prophylaxis: Xarelto 10 mg daily (s/p bovine AVR) DME needed:  None PCP: Dr. Harrington Challenger Cardiologist: Dr. Radford Pax TXA: IV Allergies: NKDA Anesthesia Concerns: None BMI: 30.4  - Patient was instructed on what medications to stop prior to surgery. - Follow-up visit in 2 weeks with Dr. Wynelle Link - Begin physical therapy following surgery - Pre-operative lab work as pre-surgical testing - Prescriptions will be provided in hospital at time of discharge  Theresa Duty, PA-C Orthopedic Surgery [facility] [place]

## 2018-07-18 NOTE — Pre-Procedure Instructions (Signed)
Left Message for Claiborne Billings to request cardiac clearance for surgery 07/24/2018.

## 2018-07-18 NOTE — Telephone Encounter (Signed)
   [facility] Pre-operative Risk Assessment    Request for surgical clearance:  1. What type of surgery is being performed? RIGHT TOTAL KNEE   2. When is this surgery scheduled? 07/24/18   3. Are there any medications that need to be held prior to surgery and how long? ASA   4. Practice name and name of physician performing surgery? [facility]; DR. Wynelle Link   5. What is your office phone and fax number? PH# [phone]; FAX# [phone] ATTN: KELLY HANCOCK   6. Anesthesia type (None, local, MAC, general) ? CHOICE   Julaine Hua 07/18/2018, 11:57 AM  _________________________________________________________________   (provider comments below)

## 2018-07-19 NOTE — Pre-Procedure Instructions (Signed)
Cardiac clearance 05/10/2018 Dapp (telephone enounter) in epic.

## 2018-07-19 NOTE — Telephone Encounter (Signed)
   Primary Cardiologist: Fransico Him, MD  Chart reviewed as part of pre-operative protocol coverage. Patient was contacted 07/19/2018 in reference to pre-operative risk assessment for pending surgery as outlined below.  Douglas Lane was last seen on 05/16/18 by Dr. Radford Pax.  Since that day, Douglas Lane has done well from a cardiac standpoint w/o any exertional CP or dyspnea.   Therefore, based on ACC/AHA guidelines, the patient would be at acceptable risk for the planned procedure without further cardiovascular testing. ASA can be held 5-7 days prior to surgery if needed. Resume as soon as safe to do so from a surgical standpoint.   I will route this recommendation to the requesting party via Epic fax function and remove from pre-op pool.  Please call with questions.  Lyda Jester, PA-C 07/19/2018, 3:53 PM

## 2018-07-20 NOTE — Pre-Procedure Instructions (Signed)
Cardiac clearance from [facility]. in epic located under 07/18/2018 telephone encounter also in hard chart.

## 2018-07-23 MED ORDER — BUPIVACAINE LIPOSOME 1.3 % IJ SUSP
20.0000 mL | INTRAMUSCULAR | Status: DC
  Filled 2018-07-23: qty 20

## 2018-07-23 NOTE — Anesthesia Preprocedure Evaluation (Addendum)
Anesthesia Evaluation  Patient identified by MRN, date of birth, ID band Patient awake    Reviewed: Allergy & Precautions, NPO status , Patient's Chart, lab work & pertinent test results  History of Anesthesia Complications Negative for: history of anesthetic complications  Airway Mallampati: III  TM Distance: >3 FB Neck ROM: Full    Dental  (+) Teeth Intact, Dental Advisory Given   Pulmonary sleep apnea ,    Pulmonary exam normal breath sounds clear to auscultation       Cardiovascular hypertension, + CAD, + Cardiac Stents and +CHF  Normal cardiovascular exam Rhythm:Regular Rate:Normal  Proximal RCA DES placed in 2017  Severe AS s/p TAVR in 2018  TTE 02/2018: severe LVH, grade 2 diastolic dysfunction, mild AR   Neuro/Psych Seizures - (last 2012), Well Controlled,     GI/Hepatic negative GI ROS, Neg liver ROS,   Endo/Other  negative endocrine ROS  Renal/GU negative Renal ROS     Musculoskeletal  (+) Arthritis , Osteoarthritis,    Abdominal   Peds  Hematology negative hematology ROS (+)   Anesthesia Other Findings Day of surgery medications reviewed with the patient.  Reproductive/Obstetrics                            Anesthesia Physical Anesthesia Plan  ASA: III  Anesthesia Plan: Spinal   Post-op Pain Management:  Regional for Post-op pain   Induction:   PONV Risk Score and Plan: 1 and Treatment may vary due to age or medical condition, Ondansetron and Midazolam  Airway Management Planned: Natural Airway and Nasal Cannula  Additional Equipment:   Intra-op Plan:   Post-operative Plan:   Informed Consent: I have reviewed the patients History and Physical, chart, labs and discussed the procedure including the risks, benefits and alternatives for the proposed anesthesia with the patient or authorized representative who has indicated his/her understanding and acceptance.      Plan Discussed with: CRNA  Anesthesia Plan Comments:        Anesthesia Quick Evaluation

## 2018-07-24 ENCOUNTER — Encounter (HOSPITAL_COMMUNITY): Payer: Self-pay | Admitting: *Deleted

## 2018-07-24 ENCOUNTER — Inpatient Hospital Stay (HOSPITAL_COMMUNITY)
Admission: RE | Admit: 2018-07-24 | Discharge: 2018-07-26 | DRG: 470 | Disposition: A | Discharge status: Home / Self Care | Payer: Medicare Other | Source: Ambulatory Visit | Attending: Orthopedic Surgery | Admitting: Orthopedic Surgery

## 2018-07-24 ENCOUNTER — Inpatient Hospital Stay (HOSPITAL_COMMUNITY): Payer: Medicare Other | Admitting: Anesthesiology

## 2018-07-24 ENCOUNTER — Other Ambulatory Visit: Payer: Self-pay

## 2018-07-24 ENCOUNTER — Encounter (HOSPITAL_COMMUNITY)
Admission: RE | Disposition: A | Discharge status: Home / Self Care | Payer: Self-pay | Source: Ambulatory Visit | Attending: Orthopedic Surgery

## 2018-07-24 DIAGNOSIS — L409 Psoriasis, unspecified: Secondary | ICD-10-CM | POA: Diagnosis present

## 2018-07-24 DIAGNOSIS — M8580 Other specified disorders of bone density and structure, unspecified site: Secondary | ICD-10-CM | POA: Diagnosis present

## 2018-07-24 DIAGNOSIS — G40009 Localization-related (focal) (partial) idiopathic epilepsy and epileptic syndromes with seizures of localized onset, not intractable, without status epilepticus: Secondary | ICD-10-CM | POA: Diagnosis present

## 2018-07-24 DIAGNOSIS — M419 Scoliosis, unspecified: Secondary | ICD-10-CM | POA: Diagnosis present

## 2018-07-24 DIAGNOSIS — Z955 Presence of coronary angioplasty implant and graft: Secondary | ICD-10-CM | POA: Diagnosis not present

## 2018-07-24 DIAGNOSIS — H9190 Unspecified hearing loss, unspecified ear: Secondary | ICD-10-CM | POA: Diagnosis present

## 2018-07-24 DIAGNOSIS — Z7982 Long term (current) use of aspirin: Secondary | ICD-10-CM | POA: Diagnosis not present

## 2018-07-24 DIAGNOSIS — Z953 Presence of xenogenic heart valve: Secondary | ICD-10-CM

## 2018-07-24 DIAGNOSIS — E785 Hyperlipidemia, unspecified: Secondary | ICD-10-CM | POA: Diagnosis present

## 2018-07-24 DIAGNOSIS — Z8249 Family history of ischemic heart disease and other diseases of the circulatory system: Secondary | ICD-10-CM | POA: Diagnosis not present

## 2018-07-24 DIAGNOSIS — M1711 Unilateral primary osteoarthritis, right knee: Secondary | ICD-10-CM | POA: Diagnosis present

## 2018-07-24 DIAGNOSIS — Z9842 Cataract extraction status, left eye: Secondary | ICD-10-CM

## 2018-07-24 DIAGNOSIS — I5032 Chronic diastolic (congestive) heart failure: Secondary | ICD-10-CM | POA: Diagnosis present

## 2018-07-24 DIAGNOSIS — M179 Osteoarthritis of knee, unspecified: Secondary | ICD-10-CM | POA: Diagnosis present

## 2018-07-24 DIAGNOSIS — Z9841 Cataract extraction status, right eye: Secondary | ICD-10-CM

## 2018-07-24 DIAGNOSIS — M1710 Unilateral primary osteoarthritis, unspecified knee: Secondary | ICD-10-CM

## 2018-07-24 DIAGNOSIS — M5136 Other intervertebral disc degeneration, lumbar region: Secondary | ICD-10-CM | POA: Diagnosis present

## 2018-07-24 DIAGNOSIS — I2510 Atherosclerotic heart disease of native coronary artery without angina pectoris: Secondary | ICD-10-CM | POA: Diagnosis present

## 2018-07-24 DIAGNOSIS — I110 Hypertensive heart disease with heart failure: Secondary | ICD-10-CM | POA: Diagnosis present

## 2018-07-24 DIAGNOSIS — Z79899 Other long term (current) drug therapy: Secondary | ICD-10-CM

## 2018-07-24 HISTORY — PX: TOTAL KNEE ARTHROPLASTY: SHX125

## 2018-07-24 LAB — TYPE AND SCREEN
ABO/RH(D): O POS
ANTIBODY SCREEN: NEGATIVE

## 2018-07-24 SURGERY — ARTHROPLASTY, KNEE, TOTAL
Anesthesia: Spinal | Site: Knee | Laterality: Right

## 2018-07-24 MED ORDER — BISACODYL 10 MG RE SUPP: 10.0000 mg | Freq: Every day | RECTAL | Status: DC | PRN

## 2018-07-24 MED ORDER — OXYCODONE HCL 5 MG PO TABS
5.0000 mg | ORAL_TABLET | ORAL | Status: DC | PRN
  Administered 2018-07-24 – 2018-07-26 (×8): 10 mg via ORAL
  Filled 2018-07-24 (×8): qty 2

## 2018-07-24 MED ORDER — METHOCARBAMOL 500 MG PO TABS
500.0000 mg | ORAL_TABLET | Freq: Four times a day (QID) | ORAL | Status: DC | PRN
  Administered 2018-07-24 – 2018-07-26 (×3): 500 mg via ORAL
  Filled 2018-07-24 (×3): qty 1

## 2018-07-24 MED ORDER — NITROGLYCERIN 0.4 MG SL SUBL: 0.4000 mg | SUBLINGUAL_TABLET | SUBLINGUAL | Status: DC | PRN

## 2018-07-24 MED ORDER — BUPIVACAINE LIPOSOME 1.3 % IJ SUSP
INTRAMUSCULAR | Status: DC | PRN
  Administered 2018-07-24: 20 mL

## 2018-07-24 MED ORDER — OXYCODONE HCL 5 MG/5ML PO SOLN
5.0000 mg | Freq: Once | ORAL | Status: DC | PRN
  Filled 2018-07-24: qty 5

## 2018-07-24 MED ORDER — METOCLOPRAMIDE HCL 5 MG/ML IJ SOLN
5.0000 mg | Freq: Three times a day (TID) | INTRAMUSCULAR | Status: DC | PRN
  Administered 2018-07-25: 10 mg via INTRAVENOUS
  Filled 2018-07-24: qty 2

## 2018-07-24 MED ORDER — CHLORHEXIDINE GLUCONATE 4 % EX LIQD: 60.0000 mL | Freq: Once | CUTANEOUS | Status: DC

## 2018-07-24 MED ORDER — DEXAMETHASONE SODIUM PHOSPHATE 10 MG/ML IJ SOLN
10.0000 mg | Freq: Once | INTRAMUSCULAR | Status: AC
  Administered 2018-07-25: 10 mg via INTRAVENOUS
  Filled 2018-07-24: qty 1

## 2018-07-24 MED ORDER — ONDANSETRON HCL 4 MG/2ML IJ SOLN
INTRAMUSCULAR | Status: AC
  Filled 2018-07-24: qty 2

## 2018-07-24 MED ORDER — TRANEXAMIC ACID-NACL 1000-0.7 MG/100ML-% IV SOLN
1000.0000 mg | INTRAVENOUS | Status: AC
  Administered 2018-07-24: 1000 mg via INTRAVENOUS
  Filled 2018-07-24: qty 100

## 2018-07-24 MED ORDER — SODIUM CHLORIDE (PF) 0.9 % IJ SOLN
INTRAMUSCULAR | Status: DC | PRN
  Administered 2018-07-24: 60 mL

## 2018-07-24 MED ORDER — STERILE WATER FOR IRRIGATION IR SOLN
Status: DC | PRN
  Administered 2018-07-24: 2000 mL

## 2018-07-24 MED ORDER — BUPIVACAINE IN DEXTROSE 0.75-8.25 % IT SOLN
INTRATHECAL | Status: DC | PRN
  Administered 2018-07-24: 1.6 mL via INTRATHECAL

## 2018-07-24 MED ORDER — CEFAZOLIN SODIUM-DEXTROSE 2-4 GM/100ML-% IV SOLN
2.0000 g | Freq: Four times a day (QID) | INTRAVENOUS | Status: AC
  Administered 2018-07-24 (×2): 2 g via INTRAVENOUS
  Filled 2018-07-24 (×2): qty 100

## 2018-07-24 MED ORDER — PROMETHAZINE HCL 25 MG/ML IJ SOLN: 6.2500 mg | INTRAMUSCULAR | Status: DC | PRN

## 2018-07-24 MED ORDER — LIDOCAINE 2% (20 MG/ML) 5 ML SYRINGE
INTRAMUSCULAR | Status: AC
  Filled 2018-07-24: qty 5

## 2018-07-24 MED ORDER — MENTHOL 3 MG MT LOZG: 1.0000 | LOZENGE | OROMUCOSAL | Status: DC | PRN

## 2018-07-24 MED ORDER — LEVETIRACETAM 500 MG PO TABS
750.0000 mg | ORAL_TABLET | Freq: Two times a day (BID) | ORAL | Status: DC
  Administered 2018-07-24 – 2018-07-26 (×4): 750 mg via ORAL
  Filled 2018-07-24 (×4): qty 1

## 2018-07-24 MED ORDER — DIPHENHYDRAMINE HCL 12.5 MG/5ML PO ELIX: 12.5000 mg | ORAL_SOLUTION | ORAL | Status: DC | PRN

## 2018-07-24 MED ORDER — SODIUM CHLORIDE (PF) 0.9 % IJ SOLN
INTRAMUSCULAR | Status: AC
  Filled 2018-07-24: qty 10

## 2018-07-24 MED ORDER — FENTANYL CITRATE (PF) 100 MCG/2ML IJ SOLN
25.0000 ug | INTRAMUSCULAR | Status: DC | PRN
  Administered 2018-07-24 (×2): 50 ug via INTRAVENOUS

## 2018-07-24 MED ORDER — FENTANYL CITRATE (PF) 100 MCG/2ML IJ SOLN
50.0000 ug | INTRAMUSCULAR | Status: DC
  Administered 2018-07-24: 50 ug via INTRAVENOUS
  Filled 2018-07-24: qty 2

## 2018-07-24 MED ORDER — ONDANSETRON HCL 4 MG/2ML IJ SOLN
4.0000 mg | Freq: Four times a day (QID) | INTRAMUSCULAR | Status: DC | PRN
  Administered 2018-07-24: 4 mg via INTRAVENOUS
  Filled 2018-07-24: qty 2

## 2018-07-24 MED ORDER — MIDAZOLAM HCL 2 MG/2ML IJ SOLN
1.0000 mg | INTRAMUSCULAR | Status: DC
  Administered 2018-07-24: 1 mg via INTRAVENOUS
  Filled 2018-07-24: qty 2

## 2018-07-24 MED ORDER — PROPOFOL 10 MG/ML IV BOLUS
INTRAVENOUS | Status: DC | PRN
  Administered 2018-07-24 (×2): 20 mg via INTRAVENOUS

## 2018-07-24 MED ORDER — METOCLOPRAMIDE HCL 5 MG PO TABS: 5.0000 mg | ORAL_TABLET | Freq: Three times a day (TID) | ORAL | Status: DC | PRN

## 2018-07-24 MED ORDER — LIDOCAINE 2% (20 MG/ML) 5 ML SYRINGE
INTRAMUSCULAR | Status: DC | PRN
  Administered 2018-07-24: 40 mg via INTRAVENOUS

## 2018-07-24 MED ORDER — ONDANSETRON HCL 4 MG PO TABS
4.0000 mg | ORAL_TABLET | Freq: Four times a day (QID) | ORAL | Status: DC | PRN
  Administered 2018-07-25: 4 mg via ORAL
  Filled 2018-07-24: qty 1

## 2018-07-24 MED ORDER — PROPOFOL 10 MG/ML IV BOLUS
INTRAVENOUS | Status: AC
  Filled 2018-07-24: qty 60

## 2018-07-24 MED ORDER — FENTANYL CITRATE (PF) 100 MCG/2ML IJ SOLN
INTRAMUSCULAR | Status: AC
  Filled 2018-07-24: qty 2

## 2018-07-24 MED ORDER — ONDANSETRON HCL 4 MG/2ML IJ SOLN
INTRAMUSCULAR | Status: DC | PRN
  Administered 2018-07-24: 4 mg via INTRAVENOUS

## 2018-07-24 MED ORDER — FLEET ENEMA 7-19 GM/118ML RE ENEM: 1.0000 | ENEMA | Freq: Once | RECTAL | Status: DC | PRN

## 2018-07-24 MED ORDER — LACTATED RINGERS IV SOLN
INTRAVENOUS | Status: DC
  Administered 2018-07-24 (×2): via INTRAVENOUS

## 2018-07-24 MED ORDER — RIVAROXABAN 10 MG PO TABS
10.0000 mg | ORAL_TABLET | Freq: Every day | ORAL | Status: DC
  Administered 2018-07-25 – 2018-07-26 (×2): 10 mg via ORAL
  Filled 2018-07-24 (×2): qty 1

## 2018-07-24 MED ORDER — GABAPENTIN 300 MG PO CAPS
300.0000 mg | ORAL_CAPSULE | Freq: Once | ORAL | Status: AC
  Administered 2018-07-24: 300 mg via ORAL
  Filled 2018-07-24: qty 1

## 2018-07-24 MED ORDER — BUPIVACAINE-EPINEPHRINE (PF) 0.5% -1:200000 IJ SOLN
INTRAMUSCULAR | Status: DC | PRN
  Administered 2018-07-24: 15 mL via PERINEURAL

## 2018-07-24 MED ORDER — OXYCODONE HCL 5 MG PO TABS: 5.0000 mg | ORAL_TABLET | Freq: Once | ORAL | Status: DC | PRN

## 2018-07-24 MED ORDER — METHOCARBAMOL 500 MG IVPB - SIMPLE MED
INTRAVENOUS | Status: AC
  Filled 2018-07-24: qty 50

## 2018-07-24 MED ORDER — SODIUM CHLORIDE 0.9 % IV SOLN
INTRAVENOUS | Status: DC
  Administered 2018-07-24 – 2018-07-25 (×2): via INTRAVENOUS

## 2018-07-24 MED ORDER — DEXAMETHASONE SODIUM PHOSPHATE 10 MG/ML IJ SOLN
8.0000 mg | Freq: Once | INTRAMUSCULAR | Status: AC
  Administered 2018-07-24: 10 mg via INTRAVENOUS

## 2018-07-24 MED ORDER — DEXAMETHASONE SODIUM PHOSPHATE 10 MG/ML IJ SOLN
INTRAMUSCULAR | Status: AC
  Filled 2018-07-24: qty 1

## 2018-07-24 MED ORDER — POLYETHYLENE GLYCOL 3350 17 G PO PACK: 17.0000 g | PACK | Freq: Every day | ORAL | Status: DC | PRN

## 2018-07-24 MED ORDER — ROSUVASTATIN CALCIUM 20 MG PO TABS
40.0000 mg | ORAL_TABLET | Freq: Every day | ORAL | Status: DC
  Administered 2018-07-24 – 2018-07-25 (×2): 40 mg via ORAL
  Filled 2018-07-24 (×3): qty 2

## 2018-07-24 MED ORDER — DOCUSATE SODIUM 100 MG PO CAPS
100.0000 mg | ORAL_CAPSULE | Freq: Two times a day (BID) | ORAL | Status: DC
  Administered 2018-07-24 – 2018-07-26 (×4): 100 mg via ORAL
  Filled 2018-07-24 (×4): qty 1

## 2018-07-24 MED ORDER — ACETAMINOPHEN 10 MG/ML IV SOLN: 1000.0000 mg | Freq: Once | INTRAVENOUS | Status: DC | PRN

## 2018-07-24 MED ORDER — ACETAMINOPHEN 10 MG/ML IV SOLN
1000.0000 mg | Freq: Four times a day (QID) | INTRAVENOUS | Status: DC
  Administered 2018-07-24: 1000 mg via INTRAVENOUS
  Filled 2018-07-24: qty 100

## 2018-07-24 MED ORDER — INSULIN ASPART 100 UNIT/ML ~~LOC~~ SOLN: 0.0000 [IU] | Freq: Three times a day (TID) | SUBCUTANEOUS | Status: DC

## 2018-07-24 MED ORDER — ACETAMINOPHEN 500 MG PO TABS
1000.0000 mg | ORAL_TABLET | Freq: Four times a day (QID) | ORAL | Status: AC
  Administered 2018-07-24 – 2018-07-25 (×3): 1000 mg via ORAL
  Filled 2018-07-24 (×3): qty 2

## 2018-07-24 MED ORDER — MORPHINE SULFATE (PF) 2 MG/ML IV SOLN
1.0000 mg | INTRAVENOUS | Status: DC | PRN
  Administered 2018-07-24: 1 mg via INTRAVENOUS
  Filled 2018-07-24: qty 1

## 2018-07-24 MED ORDER — SODIUM CHLORIDE 0.9 % IR SOLN
Status: DC | PRN
  Administered 2018-07-24: 1000 mL

## 2018-07-24 MED ORDER — SODIUM CHLORIDE (PF) 0.9 % IJ SOLN
INTRAMUSCULAR | Status: AC
  Filled 2018-07-24: qty 50

## 2018-07-24 MED ORDER — CEFAZOLIN SODIUM-DEXTROSE 2-4 GM/100ML-% IV SOLN
2.0000 g | INTRAVENOUS | Status: AC
  Administered 2018-07-24: 2 g via INTRAVENOUS
  Filled 2018-07-24: qty 100

## 2018-07-24 MED ORDER — 0.9 % SODIUM CHLORIDE (POUR BTL) OPTIME
TOPICAL | Status: DC | PRN
  Administered 2018-07-24: 1000 mL

## 2018-07-24 MED ORDER — EZETIMIBE 10 MG PO TABS
10.0000 mg | ORAL_TABLET | Freq: Every day | ORAL | Status: DC
  Administered 2018-07-25 – 2018-07-26 (×2): 10 mg via ORAL
  Filled 2018-07-24 (×2): qty 1

## 2018-07-24 MED ORDER — METHOCARBAMOL 500 MG IVPB - SIMPLE MED
500.0000 mg | Freq: Four times a day (QID) | INTRAVENOUS | Status: DC | PRN
  Administered 2018-07-24: 500 mg via INTRAVENOUS
  Filled 2018-07-24: qty 50

## 2018-07-24 MED ORDER — TRAMADOL HCL 50 MG PO TABS
50.0000 mg | ORAL_TABLET | Freq: Four times a day (QID) | ORAL | Status: DC | PRN
  Administered 2018-07-24: 100 mg via ORAL
  Filled 2018-07-24: qty 2

## 2018-07-24 MED ORDER — PROPOFOL 500 MG/50ML IV EMUL
INTRAVENOUS | Status: DC | PRN
  Administered 2018-07-24: 50 ug/kg/min via INTRAVENOUS

## 2018-07-24 MED ORDER — PHENOL 1.4 % MT LIQD
1.0000 | OROMUCOSAL | Status: DC | PRN
  Filled 2018-07-24: qty 177

## 2018-07-24 SURGICAL SUPPLY — 63 items
ATTUNE MED DOME PAT 38 KNEE (Knees) ×2 IMPLANT
ATTUNE MED DOME PAT 38MM KNEE (Knees) ×1 IMPLANT
ATTUNE PS FEM RT SZ 7 CEM KNEE (Femur) ×3 IMPLANT
ATTUNE PSRP INSR SZ7 8 KNEE (Insert) ×2 IMPLANT
ATTUNE PSRP INSR SZ7 8MM KNEE (Insert) ×1 IMPLANT
BAG SPEC THK2 15X12 ZIP CLS (MISCELLANEOUS) ×1
BAG ZIPLOCK 12X15 (MISCELLANEOUS) ×3 IMPLANT
BANDAGE ACE 6X5 VEL STRL LF (GAUZE/BANDAGES/DRESSINGS) ×3 IMPLANT
BANDAGE ELASTIC 6 VELCRO ST LF (GAUZE/BANDAGES/DRESSINGS) ×3 IMPLANT
BASE TIBIAL ROT PLAT SZ 7 KNEE (Knees) ×1 IMPLANT
BLADE SAG 18X100X1.27 (BLADE) ×3 IMPLANT
BLADE SAW SGTL 11.0X1.19X90.0M (BLADE) ×3 IMPLANT
BLADE SURG SZ10 CARB STEEL (BLADE) ×6 IMPLANT
BOWL SMART MIX CTS (DISPOSABLE) ×3 IMPLANT
BSPLAT TIB 7 CMNT ROT PLAT STR (Knees) ×1 IMPLANT
CEMENT HV SMART SET (Cement) ×6 IMPLANT
CLOSURE WOUND 1/2 X4 (GAUZE/BANDAGES/DRESSINGS) ×2
COVER SURGICAL LIGHT HANDLE (MISCELLANEOUS) ×3 IMPLANT
COVER WAND RF STERILE (DRAPES) IMPLANT
CUFF TOURN SGL QUICK 34 (TOURNIQUET CUFF) ×3
CUFF TRNQT CYL 34X4X40X1 (TOURNIQUET CUFF) ×1 IMPLANT
DECANTER SPIKE VIAL GLASS SM (MISCELLANEOUS) ×3 IMPLANT
DRAPE U-SHAPE 47X51 STRL (DRAPES) ×3 IMPLANT
DRSG ADAPTIC 3X8 NADH LF (GAUZE/BANDAGES/DRESSINGS) ×3 IMPLANT
DRSG PAD ABDOMINAL 8X10 ST (GAUZE/BANDAGES/DRESSINGS) ×3 IMPLANT
DURAPREP 26ML APPLICATOR (WOUND CARE) ×3 IMPLANT
ELECT REM PT RETURN 15FT ADLT (MISCELLANEOUS) ×3 IMPLANT
EVACUATOR 1/8 PVC DRAIN (DRAIN) ×3 IMPLANT
GAUZE SPONGE 4X4 12PLY STRL (GAUZE/BANDAGES/DRESSINGS) ×3 IMPLANT
GLOVE BIO SURGEON STRL SZ7 (GLOVE) ×3 IMPLANT
GLOVE BIO SURGEON STRL SZ8 (GLOVE) ×3 IMPLANT
GLOVE BIOGEL PI IND STRL 6.5 (GLOVE) ×1 IMPLANT
GLOVE BIOGEL PI IND STRL 7.0 (GLOVE) ×1 IMPLANT
GLOVE BIOGEL PI IND STRL 8 (GLOVE) ×1 IMPLANT
GLOVE BIOGEL PI INDICATOR 6.5 (GLOVE) ×2
GLOVE BIOGEL PI INDICATOR 7.0 (GLOVE) ×2
GLOVE BIOGEL PI INDICATOR 8 (GLOVE) ×2
GLOVE SURG SS PI 6.5 STRL IVOR (GLOVE) ×3 IMPLANT
GOWN STRL REUS W/TWL LRG LVL3 (GOWN DISPOSABLE) ×6 IMPLANT
GOWN STRL REUS W/TWL XL LVL3 (GOWN DISPOSABLE) ×3 IMPLANT
HANDPIECE INTERPULSE COAX TIP (DISPOSABLE) ×3
HOLDER FOLEY CATH W/STRAP (MISCELLANEOUS) IMPLANT
IMMOBILIZER KNEE 20 (SOFTGOODS) ×6 IMPLANT
IMMOBILIZER KNEE 20 THIGH 36 (SOFTGOODS) ×1 IMPLANT
MANIFOLD NEPTUNE II (INSTRUMENTS) ×3 IMPLANT
NS IRRIG 1000ML POUR BTL (IV SOLUTION) ×3 IMPLANT
PACK TOTAL KNEE CUSTOM (KITS) ×3 IMPLANT
PAD ABD 8X10 STRL (GAUZE/BANDAGES/DRESSINGS) ×3 IMPLANT
PADDING CAST COTTON 6X4 STRL (CAST SUPPLIES) ×6 IMPLANT
PIN STEINMAN FIXATION KNEE (PIN) ×3 IMPLANT
PROTECTOR NERVE ULNAR (MISCELLANEOUS) ×3 IMPLANT
SET HNDPC FAN SPRY TIP SCT (DISPOSABLE) ×1 IMPLANT
STRIP CLOSURE SKIN 1/2X4 (GAUZE/BANDAGES/DRESSINGS) ×4 IMPLANT
SUT MNCRL AB 4-0 PS2 18 (SUTURE) ×3 IMPLANT
SUT STRATAFIX 0 PDS 27 VIOLET (SUTURE) ×6
SUT VIC AB 2-0 CT1 27 (SUTURE) ×6
SUT VIC AB 2-0 CT1 TAPERPNT 27 (SUTURE) ×3 IMPLANT
SUTURE STRATFX 0 PDS 27 VIOLET (SUTURE) ×2 IMPLANT
TIBIAL BASE ROT PLAT SZ 7 KNEE (Knees) ×3 IMPLANT
TRAY FOLEY MTR SLVR 16FR STAT (SET/KITS/TRAYS/PACK) ×3 IMPLANT
WATER STERILE IRR 1000ML POUR (IV SOLUTION) ×6 IMPLANT
WRAP KNEE MAXI GEL POST OP (GAUZE/BANDAGES/DRESSINGS) ×3 IMPLANT
YANKAUER SUCT BULB TIP 10FT TU (MISCELLANEOUS) ×3 IMPLANT

## 2018-07-24 NOTE — Anesthesia Postprocedure Evaluation (Signed)
Anesthesia Post Note  Patient: Douglas Lane  Procedure(s) Performed: RIGHT TOTAL KNEE ARTHROPLASTY (Right Knee)     Patient location during evaluation: PACU Anesthesia Type: Spinal Level of consciousness: awake and alert Pain management: pain level controlled Vital Signs Assessment: post-procedure vital signs reviewed and stable Respiratory status: spontaneous breathing, nonlabored ventilation and respiratory function stable Cardiovascular status: blood pressure returned to baseline and stable Postop Assessment: no apparent nausea or vomiting and spinal receding Anesthetic complications: no    Last Vitals:  Vitals:   07/24/18 1145 07/24/18 1200  BP: 108/86 122/68  Pulse: 67 64  Resp: 12 13  Temp:  (!) 36.4 C  SpO2: 94% 96%    Last Pain:  Vitals:   07/24/18 1200  TempSrc:   PainSc: [place]

## 2018-07-24 NOTE — Transfer of Care (Signed)
Immediate Anesthesia Transfer of Care Note  Patient: Douglas Lane  Procedure(s) Performed: RIGHT TOTAL KNEE ARTHROPLASTY (Right Knee)  Patient Location: PACU  Anesthesia Type:Spinal  Level of Consciousness: sedated  Airway & Oxygen Therapy: Patient Spontanous Breathing and Patient connected to face mask oxygen  Post-op Assessment: Report given to RN and Post -op Vital signs reviewed and stable  Post vital signs: Reviewed and stable  Last Vitals:  Vitals Value Taken Time  BP    Temp    Pulse 80 07/24/2018 11:07 AM  Resp 14 07/24/2018 11:07 AM  SpO2 95 % 07/24/2018 11:07 AM  Vitals shown include unvalidated device data.  Last Pain:  Vitals:   07/24/18 0851  TempSrc:   PainSc: 0-No pain      Patients Stated Pain Goal: 4 (16/10/96 0454)  Complications: No apparent anesthesia complications

## 2018-07-24 NOTE — Progress Notes (Signed)
Assisted Dr. Howze with right, ultrasound guided, adductor canal block. Side rails up, monitors on throughout procedure. See vital signs in flow sheet. Tolerated Procedure well.  

## 2018-07-24 NOTE — Interval H&P Note (Signed)
History and Physical Interval Note:  07/24/2018 7:03 AM  Douglas Lane  has presented today for surgery, with the diagnosis of right knee osteoarthritis  The various methods of treatment have been discussed with the patient and family. After consideration of risks, benefits and other options for treatment, the patient has consented to  Procedure(s) with comments: RIGHT TOTAL KNEE ARTHROPLASTY (Right) - 21min as a surgical intervention .  The patient's history has been reviewed, patient examined, no change in status, stable for surgery.  I have reviewed the patient's chart and labs.  Questions were answered to the patient's satisfaction.     [name] Stefhanie Kachmar

## 2018-07-24 NOTE — Anesthesia Procedure Notes (Signed)
Anesthesia Regional Block: Adductor canal block   Pre-Anesthetic Checklist: ,, timeout performed, Correct Patient, Correct Site, Correct Laterality, Correct Procedure, Correct Position, site marked, Risks and benefits discussed, pre-op evaluation,  At surgeon's request and post-op pain management  Laterality: Right  Prep: Maximum Sterile Barrier Precautions used, chloraprep       Needles:  Injection technique: Single-shot  Needle Type: Echogenic Stimulator Needle     Needle Length: 9cm  Needle Gauge: 22     Additional Needles:   Procedures:,,,, ultrasound used (permanent image in chart),,,,  Narrative:  Start time: 07/24/2018 8:48 AM End time: 07/24/2018 8:50 AM Injection made incrementally with aspirations every 5 mL.  Performed by: Personally  Anesthesiologist: Brennan Bailey, MD  Additional Notes: Risks, benefits, and alternative discussed. Patient gave consent for procedure. Patient prepped and draped in sterile fashion. Sedation administered, patient remains easily responsive to voice. Relevant anatomy identified with ultrasound guidance. Local anesthetic given in 5cc increments with no signs or symptoms of intravascular injection. No pain or paraesthesias with injection. Patient monitored throughout procedure with signs of LAST or immediate complications. Tolerated well. Ultrasound image placed in chart.  Tawny Asal, MD

## 2018-07-24 NOTE — Anesthesia Procedure Notes (Signed)
Date/Time: 07/24/2018 9:39 AM Performed by: Talbot Grumbling, CRNA Oxygen Delivery Method: Simple face mask

## 2018-07-24 NOTE — Evaluation (Signed)
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 Hx of Sz, chronic back pain.   Clinical Impression  On eval POD 0, pt was Min assist for mobility. He walked ~10 feet with a RW. Ambulation distance was limited by pain, N/V. Deferred any further activity. Encouraged pt to sit up in recliner as tolerated. Will follow and progress activity as tolerated. Plan is for d/c home with OP PT f/u.     Follow Up Recommendations Follow surgeon's recommendation for DC plan and follow-up therapies    Equipment Recommendations  None recommended by PT    Recommendations for Other Services       Precautions / Restrictions Precautions Precautions: Fall;Knee Required Braces or Orthoses: Knee Immobilizer - Right Knee Immobilizer - Right: Discontinue once straight leg raise with < 10 degree lag Restrictions Weight Bearing Restrictions: No Other Position/Activity Restrictions: WBAT      Mobility  Bed Mobility Overal bed mobility: Needs Assistance Bed Mobility: Supine to Sit     Supine to sit: Min assist;HOB elevated     General bed mobility comments: Assist for R LE. Cues for safety, technique.   Transfers Overall transfer level: Needs assistance Equipment used: Rolling walker (2 wheeled) Transfers: Sit to/from Stand Sit to Stand: Min assist         General transfer comment: VCs safety, technique, hand/LE placement. Assist to rise, stabilize, control descent.   Ambulation/Gait Ambulation/Gait assistance: Min assist;+2 safety/equipment Gait Distance (Feet): 10 Feet Assistive device: Rolling walker (2 wheeled) Gait Pattern/deviations: Step-to pattern;Trunk flexed;Antalgic     General Gait Details: VCs safety, technique, sequence. Slow gait speed. Distance limited by pain and nausea. Recliner used to transport pt back to room  Stairs             Wheelchair Mobility    Modified Rankin (Stroke Patients Only)       Balance Overall balance assessment: Needs assistance         Standing balance support: Bilateral upper extremity supported Standing balance-Leahy Scale: Poor                               Pertinent Vitals/Pain Pain Assessment: 0-10 Pain Score: 8  Pain Location: R knee Pain Descriptors / Indicators: Aching;Sore;Grimacing;Discomfort Pain Intervention(s): Limited activity within patient's tolerance;Repositioned    Home Living Family/patient expects to be discharged to:: Private residence Living Arrangements: Spouse/significant other;Children   Type of Home: House Home Access: Stairs to enter Entrance Stairs-Rails: Right Entrance Stairs-Number of Steps: 3 Home Layout: Two level;Able to live on main level with bedroom/bathroom Home Equipment: Gilford Rile - 2 wheels;Cane - single point;Crutches      Prior Function Level of Independence: Independent               Hand Dominance        Extremity/Trunk Assessment   Upper Extremity Assessment Upper Extremity Assessment: Overall WFL for tasks assessed    Lower Extremity Assessment Lower Extremity Assessment: Generalized weakness(s/p R TKA)    Cervical / Trunk Assessment Cervical / Trunk Assessment: Normal  Communication   Communication: No difficulties  Cognition Arousal/Alertness: Awake/alert Behavior During Therapy: WFL for tasks assessed/performed Overall Cognitive Status: Within Functional Limits for tasks assessed  General Comments      Exercises     Assessment/Plan    PT Assessment Patient needs continued PT services  PT Problem List Decreased strength;Decreased range of motion;Decreased balance;Decreased mobility;Pain;Decreased activity tolerance;Decreased knowledge of use of DME;Decreased knowledge of precautions       PT Treatment Interventions DME  instruction;Gait training;Functional mobility training;Therapeutic activities;Balance training;Patient/family education;Therapeutic exercise;Stair training    PT Goals (Current goals can be found in the Care Plan section)  Acute Rehab PT Goals Patient Stated Goal: less pain. regain PLOF.  PT Goal Formulation: With patient Time For Goal Achievement: 08/07/18 Potential to Achieve Goals: Good    Frequency 7X/week   Barriers to discharge        Co-evaluation               AM-PAC PT "6 Clicks" Mobility  Outcome Measure Help needed turning from your back to your side while in a flat bed without using bedrails?: A Little Help needed moving from lying on your back to sitting on the side of a flat bed without using bedrails?: A Little Help needed moving to and from a bed to a chair (including a wheelchair)?: A Little Help needed standing up from a chair using your arms (e.g., wheelchair or bedside chair)?: A Little Help needed to walk in hospital room?: A Little Help needed climbing 3-5 steps with a railing? : A Little 6 Click Score: 18    End of Session Equipment Utilized During Treatment: Gait belt;Right knee immobilizer Activity Tolerance: Patient limited by pain(Limited by nausea) Patient left: in chair;with call bell/phone within reach   PT Visit Diagnosis: Pain;Other abnormalities of gait and mobility (R26.89) Pain - Right/Left: Right Pain - part of body: Knee    Time: 0932-3557 PT Time Calculation (min) (ACUTE ONLY): 20 min   Charges:   PT Evaluation $PT Eval Low Complexity: [place], PT Acute Rehabilitation Services Pager: [phone] Office: [phone]

## 2018-07-24 NOTE — Anesthesia Procedure Notes (Signed)
Spinal  Patient location during procedure: OR Start time: 07/24/2018 9:37 AM End time: 07/24/2018 9:38 AM Staffing Anesthesiologist: Brennan Bailey, MD Performed: anesthesiologist  Preanesthetic Checklist Completed: patient identified, surgical consent, pre-op evaluation, timeout performed, IV checked, risks and benefits discussed and monitors and equipment checked Spinal Block Patient position: sitting Prep: site prepped and draped and DuraPrep Patient monitoring: cardiac monitor, continuous pulse ox and blood pressure Approach: midline Location: L3-4 Injection technique: single-shot Needle Needle type: Whitacre  Needle gauge: 22 G Needle length: 9 cm Additional Notes Risks, benefits, and alternative discussed. Patient gave consent to procedure. Prepped and draped in sitting position. Clear CSF obtained after one needle pass. Positive terminal aspiration. No pain or paraesthesias with injection. Patient tolerated procedure well. Vital signs stable. Tawny Asal, MD

## 2018-07-24 NOTE — Discharge Instructions (Addendum)
° °Dr. Frank Aluisio °Total Joint Specialist °[facility] °[address] °[place], [place] [address] °[phone] ° °TOTAL KNEE REPLACEMENT POSTOPERATIVE DIRECTIONS ° °Knee Rehabilitation, Guidelines Following Surgery  °Results after knee surgery are often greatly improved when you follow the exercise, range of motion and muscle strengthening exercises prescribed by your doctor. Safety measures are also important to protect the knee from further injury. Any time any of these exercises cause you to have increased pain or swelling in your knee joint, decrease the amount until you are comfortable again and slowly increase them. If you have problems or questions, call your caregiver or physical therapist for advice.  ° °HOME CARE INSTRUCTIONS  °• Remove items at home which could result in a fall. This includes throw rugs or furniture in walking pathways.  °· ICE to the affected knee every three hours for 30 minutes at a time and then as needed for pain and swelling.  Continue to use ice on the knee for pain and swelling from surgery. You may notice swelling that will progress down to the foot and ankle.  This is normal after surgery.  Elevate the leg when you are not up walking on it.   °· Continue to use the breathing machine which will help keep your temperature down.  It is common for your temperature to cycle up and down following surgery, especially at night when you are not up moving around and exerting yourself.  The breathing machine keeps your lungs expanded and your temperature down. °· Do not place pillow under knee, focus on keeping the knee straight while resting ° °DIET °You may resume your previous home diet once your are discharged from the hospital. ° °DRESSING / WOUND CARE / SHOWERING °You may shower 3 days after surgery, but keep the wounds dry during showering.  You may use an occlusive plastic wrap (Press'n Seal for example), NO SOAKING/SUBMERGING IN THE BATHTUB.  If the bandage  gets wet, change with a clean dry gauze.  If the incision gets wet, pat the wound dry with a clean towel. °You may start showering once you are discharged home but do not submerge the incision under water. Just pat the incision dry and apply a dry gauze dressing on daily. °Change the surgical dressing daily and reapply a dry dressing each time. ° °ACTIVITY °Walk with your walker as instructed. °Use walker as long as suggested by your caregivers. °Avoid periods of inactivity such as sitting longer than an hour when not asleep. This helps prevent blood clots.  °You may resume a sexual relationship in one month or when given the OK by your doctor.  °You may return to work once you are cleared by your doctor.  °Do not drive a car for 6 weeks or until released by you surgeon.  °Do not drive while taking narcotics. ° °WEIGHT BEARING °Weight bearing as tolerated with assist device (walker, cane, etc) as directed, use it as long as suggested by your surgeon or therapist, typically at least 4-6 weeks. ° °POSTOPERATIVE CONSTIPATION PROTOCOL °Constipation - defined medically as fewer than three stools per week and severe constipation as less than one stool per week. ° °One of the most common issues patients have following surgery is constipation.  Even if you have a regular bowel pattern at home, your normal regimen is likely to be disrupted due to multiple reasons following surgery.  Combination of anesthesia, postoperative narcotics, change in appetite and fluid intake all can affect your bowels.    In order to avoid complications following surgery, here are some recommendations in order to help you during your recovery period. ° °Colace (docusate) - Pick up an over-the-counter form of Colace or another stool softener and take twice a day as long as you are requiring postoperative pain medications.  Take with a full glass of water daily.  If you experience loose stools or diarrhea, hold the colace until you stool forms back  up.  If your symptoms do not get better within 1 week or if they get worse, check with your doctor. ° °Dulcolax (bisacodyl) - Pick up over-the-counter and take as directed by the product packaging as needed to assist with the movement of your bowels.  Take with a full glass of water.  Use this product as needed if not relieved by Colace only.  ° °MiraLax (polyethylene glycol) - Pick up over-the-counter to have on hand.  MiraLax is a solution that will increase the amount of water in your bowels to assist with bowel movements.  Take as directed and can mix with a glass of water, juice, soda, coffee, or tea.  Take if you go more than two days without a movement. °Do not use MiraLax more than once per day. Call your doctor if you are still constipated or irregular after using this medication for 7 days in a row. ° °If you continue to have problems with postoperative constipation, please contact the office for further assistance and recommendations.  If you experience "the worst abdominal pain ever" or develop nausea or vomiting, please contact the office immediatly for further recommendations for treatment. ° °ITCHING ° If you experience itching with your medications, try taking only a single pain pill, or even half a pain pill at a time.  You can also use Benadryl over the counter for itching or also to help with sleep.  ° °TED HOSE STOCKINGS °Wear the elastic stockings on both legs for three weeks following surgery during the day but you may remove then at night for sleeping. ° °MEDICATIONS °See your medication summary on the “After Visit Summary” that the nursing staff will review with you prior to discharge.  You may have some home medications which will be placed on hold until you complete the course of blood thinner medication.  It is important for you to complete the blood thinner medication as prescribed by your surgeon.  Continue your approved medications as instructed at time of discharge. ° °PRECAUTIONS °If  you experience chest pain or shortness of breath - call 911 immediately for transfer to the hospital emergency department.  °If you develop a fever greater that 101 F, purulent drainage from wound, increased redness or drainage from wound, foul odor from the wound/dressing, or calf pain - CONTACT YOUR SURGEON.   °                                                °FOLLOW-UP APPOINTMENTS °Make sure you keep all of your appointments after your operation with your surgeon and caregivers. You should call the office at the above phone number and make an appointment for approximately two weeks after the date of your surgery or on the date instructed by your surgeon outlined in the "After Visit Summary". ° ° °RANGE OF MOTION AND STRENGTHENING EXERCISES  °Rehabilitation of the knee is important following a knee injury or   an operation. After just a few days of immobilization, the muscles of the thigh which control the knee become weakened and shrink (atrophy). Knee exercises are designed to build up the tone and strength of the thigh muscles and to improve knee motion. Often times heat used for twenty to thirty minutes before working out will loosen up your tissues and help with improving the range of motion but do not use heat for the first two weeks following surgery. These exercises can be done on a training (exercise) mat, on the floor, on a table or on a bed. Use what ever works the best and is most comfortable for you Knee exercises include:  °• Leg Lifts - While your knee is still immobilized in a splint or cast, you can do straight leg raises. Lift the leg to 60 degrees, hold for 3 sec, and slowly lower the leg. Repeat 10-20 times 2-3 times daily. Perform this exercise against resistance later as your knee gets better.  °• Quad and Hamstring Sets - Tighten up the muscle on the front of the thigh (Quad) and hold for 5-10 sec. Repeat this 10-20 times hourly. Hamstring sets are done by pushing the foot backward against an  object and holding for 5-10 sec. Repeat as with quad sets.  °· Leg Slides: Lying on your back, slowly slide your foot toward your buttocks, bending your knee up off the floor (only go as far as is comfortable). Then slowly slide your foot back down until your leg is flat on the floor again. °· Angel Wings: Lying on your back spread your legs to the side as far apart as you can without causing discomfort.  °A rehabilitation program following serious knee injuries can speed recovery and prevent re-injury in the future due to weakened muscles. Contact your doctor or a physical therapist for more information on knee rehabilitation.  ° °IF YOU ARE TRANSFERRED TO A SKILLED REHAB FACILITY °If the patient is transferred to a skilled rehab facility following release from the hospital, a list of the current medications will be sent to the facility for the patient to continue.  When discharged from the skilled rehab facility, please have the facility set up the patient's Home Health Physical Therapy prior to being released. Also, the skilled facility will be responsible for providing the patient with their medications at time of release from the facility to include their pain medication, the muscle relaxants, and their blood thinner medication. If the patient is still at the rehab facility at time of the two week follow up appointment, the skilled rehab facility will also need to assist the patient in arranging follow up appointment in our office and any transportation needs. ° °MAKE SURE YOU:  °• Understand these instructions.  °• Get help right away if you are not doing well or get worse.  ° ° °Pick up stool softner and laxative for home use following surgery while on pain medications. °Do not submerge incision under water. °Please use good hand washing techniques while changing dressing each day. °May shower starting three days after surgery. °Please use a clean towel to pat the incision dry following showers. °Continue to  use ice for pain and swelling after surgery. °Do not use any lotions or creams on the incision until instructed by your surgeon. ° ° °_____________________________________________ ° °Information on my medicine - XARELTO® (Rivaroxaban) ° °This medication education was reviewed with me or my healthcare representative as part of my discharge preparation.  ° °Why was   Xarelto® prescribed for you? °Xarelto® was prescribed for you to reduce the risk of blood clots forming after orthopedic surgery. The medical term for these abnormal blood clots is venous thromboembolism (VTE). ° °What do you need to know about xarelto® ? °Take your Xarelto® ONCE DAILY at the same time every day. °You may take it either with or without food. ° °If you have difficulty swallowing the tablet whole, you may crush it and mix in applesauce just prior to taking your dose. ° °Take Xarelto® exactly as prescribed by your doctor and DO NOT stop taking Xarelto® without talking to the doctor who prescribed the medication.  Stopping without other VTE prevention medication to take the place of Xarelto® may increase your risk of developing a clot. ° °After discharge, you should have regular check-up appointments with your healthcare provider that is prescribing your Xarelto®.   ° °What do you do if you miss a dose? °If you miss a dose, take it as soon as you remember on the same day then continue your regularly scheduled once daily regimen the next day. Do not take two doses of Xarelto® on the same day.  ° °Important Safety Information °A possible side effect of Xarelto® is bleeding. You should call your healthcare provider right away if you experience any of the following: °? Bleeding from an injury or your nose that does not stop. °? Unusual colored urine (red or dark brown) or unusual colored stools (red or black). °? Unusual bruising for unknown reasons. °? A serious fall or if you hit your head (even if there is no bleeding). ° °Some medicines may  interact with Xarelto® and might increase your risk of bleeding while on Xarelto®. To help avoid this, consult your healthcare provider or pharmacist prior to using any new prescription or non-prescription medications, including herbals, vitamins, non-steroidal anti-inflammatory drugs (NSAIDs) and supplements. ° °This website has more information on Xarelto®: www.xarelto.com. ° ° °

## 2018-07-24 NOTE — Op Note (Signed)
OPERATIVE REPORT-TOTAL KNEE ARTHROPLASTY   Pre-operative diagnosis- Osteoarthritis  Right knee(s)  Post-operative diagnosis- Osteoarthritis Right knee(s)  Procedure-  Right  Total Knee Arthroplasty  Surgeon- Douglas Plover. Alivia Cimino, MD  Assistant- Molli Barrows, PA-C   Anesthesia-  Adductor canal block and spinal  EBL- 100 ml   Drains Hemovac  Tourniquet time-  Total Tourniquet Time Documented: Thigh (Right) - 33 minutes Total: Thigh (Right) - 33 minutes     Complications- None  Condition-PACU - hemodynamically stable.   Brief Clinical Note  Douglas Lane is a 72 y.o. year old male with end stage OA of his right knee with progressively worsening pain and dysfunction. He has constant pain, with activity and at rest and significant functional deficits with difficulties even with ADLs. He has had extensive non-op management including analgesics, injections of cortisone and viscosupplements, and home exercise program, but remains in significant pain with significant dysfunction. Radiographs show bone on bone arthritis medial and patellofemoral. He presents now for right Total Knee Arthroplasty.    Procedure in detail---   The patient is brought into the operating room and positioned supine on the operating table. After successful administration of  Adductor canal block and spinal,   a tourniquet is placed high on the  Right thigh(s) and the lower extremity is prepped and draped in the usual sterile fashion. Time out is performed by the operating team and then the  Right lower extremity is wrapped in Esmarch, knee flexed and the tourniquet inflated to 300 mmHg.       A midline incision is made with a ten blade through the subcutaneous tissue to the level of the extensor mechanism. A fresh blade is used to make a medial parapatellar arthrotomy. Soft tissue over the proximal medial tibia is subperiosteally elevated to the joint line with a knife and into the semimembranosus bursa with a Cobb  elevator. Soft tissue over the proximal lateral tibia is elevated with attention being paid to avoiding the patellar tendon on the tibial tubercle. The patella is everted, knee flexed 90 degrees and the ACL and PCL are removed. Findings are bone on medial and patellofemoral with massive global osteophytes        The drill is used to create a starting hole in the distal femur and the canal is thoroughly irrigated with sterile saline to remove the fatty contents. The 5 degree Right  valgus alignment guide is placed into the femoral canal and the distal femoral cutting block is pinned to remove 9 mm off the distal femur. Resection is made with an oscillating saw.      The tibia is subluxed forward and the menisci are removed. The extramedullary alignment guide is placed referencing proximally at the medial aspect of the tibial tubercle and distally along the second metatarsal axis and tibial crest. The block is pinned to remove 66mm off the more deficient medial  side. Resection is made with an oscillating saw. Size 7is the most appropriate size for the tibia and the proximal tibia is prepared with the modular drill and keel punch for that size.      The femoral sizing guide is placed and size 7 is most appropriate. Rotation is marked off the epicondylar axis and confirmed by creating a rectangular flexion gap at 90 degrees. The size 7 cutting block is pinned in this rotation and the anterior, posterior and chamfer cuts are made with the oscillating saw. The intercondylar block is then placed and that cut is made.  Trial size 7 tibial component, trial size 7 posterior stabilized femur and a 8  mm posterior stabilized rotating platform insert trial is placed. Full extension is achieved with excellent varus/valgus and anterior/posterior balance throughout full range of motion. The patella is everted and thickness measured to be 24  mm. Free hand resection is taken to 14 mm, a 38 template is placed, lug holes are  drilled, trial patella is placed, and it tracks normally. Osteophytes are removed off the posterior femur with the trial in place. All trials are removed and the cut bone surfaces prepared with pulsatile lavage. Cement is mixed and once ready for implantation, the size 7 tibial implant, size  7 posterior stabilized femoral component, and the size 38 patella are cemented in place and the patella is held with the clamp. The trial insert is placed and the knee held in full extension. The Exparel (20 ml mixed with 60 ml saline) is injected into the extensor mechanism, posterior capsule, medial and lateral gutters and subcutaneous tissues.  All extruded cement is removed and once the cement is hard the permanent 8 mm posterior stabilized rotating platform insert is placed into the tibial tray.      The wound is copiously irrigated with saline solution and the extensor mechanism closed over a hemovac drain with #1 V-loc suture. The tourniquet is released for a total tourniquet time of 33  minutes. Flexion against gravity is 140 degrees and the patella tracks normally. Subcutaneous tissue is closed with 2.0 vicryl and subcuticular with running 4.0 Monocryl. The incision is cleaned and dried and steri-strips and a bulky sterile dressing are applied. The limb is placed into a knee immobilizer and the patient is awakened and transported to recovery in stable condition.      Please note that a surgical assistant was a medical necessity for this procedure in order to perform it in a safe and expeditious manner. Surgical assistant was necessary to retract the ligaments and vital neurovascular structures to prevent injury to them and also necessary for proper positioning of the limb to allow for anatomic placement of the prosthesis.   Douglas Plover Tyeesha Riker, MD    07/24/2018, 10:43 AM

## 2018-07-25 ENCOUNTER — Encounter (HOSPITAL_COMMUNITY): Payer: Self-pay | Admitting: Orthopedic Surgery

## 2018-07-25 LAB — BASIC METABOLIC PANEL
Anion gap: 10 (ref 5–15)
BUN: 25 mg/dL — ABNORMAL HIGH (ref 8–23)
CO2: 23 mmol/L (ref 22–32)
Calcium: 8.3 mg/dL — ABNORMAL LOW (ref 8.9–10.3)
Chloride: 104 mmol/L (ref 98–111)
Creatinine, Ser: 1.16 mg/dL (ref 0.61–1.24)
GFR calc Af Amer: >60 mL/min (ref >60)
GFR calc non Af Amer: >60 mL/min (ref >60)
Glucose, Bld: 137 mg/dL — ABNORMAL HIGH (ref 70–99)
Potassium: 5 mmol/L (ref 3.5–5.1)
Sodium: 137 mmol/L (ref 135–145)

## 2018-07-25 LAB — CBC
HEMATOCRIT: 38.7 % — AB (ref 39.0–52.0)
Hemoglobin: 12.1 g/dL — ABNORMAL LOW (ref 13.0–17.0)
MCH: 30.1 pg (ref 26.0–34.0)
MCHC: 31.3 g/dL (ref 30.0–36.0)
MCV: 96.3 fL (ref 80.0–100.0)
Platelets: 123 10*3/uL — ABNORMAL LOW (ref 150–400)
RBC: 4.02 MIL/uL — ABNORMAL LOW (ref 4.22–5.81)
RDW: 13.2 % (ref 11.5–15.5)
WBC: 11.2 10*3/uL — ABNORMAL HIGH (ref 4.0–10.5)
nRBC: 0 % (ref 0.0–0.2)

## 2018-07-25 LAB — GLUCOSE, CAPILLARY: Glucose-Capillary: 153 mg/dL — ABNORMAL HIGH (ref 70–99)

## 2018-07-25 MED ORDER — TRAMADOL HCL 50 MG PO TABS: 50.0000 mg | ORAL_TABLET | Freq: Four times a day (QID) | ORAL | 0 refills | Status: DC | PRN

## 2018-07-25 MED ORDER — METHOCARBAMOL 500 MG PO TABS: 500.0000 mg | ORAL_TABLET | Freq: Four times a day (QID) | ORAL | 0 refills | Status: DC | PRN

## 2018-07-25 MED ORDER — OXYCODONE HCL 5 MG PO TABS: 5.0000 mg | ORAL_TABLET | Freq: Four times a day (QID) | ORAL | 0 refills | Status: DC | PRN

## 2018-07-25 MED ORDER — RIVAROXABAN 10 MG PO TABS: 10.0000 mg | ORAL_TABLET | Freq: Every day | ORAL | 0 refills | Status: DC

## 2018-07-25 MED ORDER — CALCIUM CARBONATE ANTACID 500 MG PO CHEW
1.0000 | CHEWABLE_TABLET | Freq: Two times a day (BID) | ORAL | Status: DC | PRN
  Administered 2018-07-25 (×2): 200 mg via ORAL
  Filled 2018-07-25 (×3): qty 1

## 2018-07-25 NOTE — Progress Notes (Signed)
Physical Therapy Treatment Patient Details Name: Douglas Lane MRN: [id number] DOB: 27-Feb-1946 Today's Date: 07/25/2018    History of Present Illness 72 yo male s/p R TKA 07/24/18. Hx of Sz, chronic back pain.     PT Comments    Progressing with mobility. Plan is for d/c home tomorrow with OP PT f/u.    Follow Up Recommendations  Follow surgeon's recommendation for DC plan and follow-up therapies     Equipment Recommendations  None recommended by PT    Recommendations for Other Services       Precautions / Restrictions Precautions Precautions: Fall;Knee Required Braces or Orthoses: Knee Immobilizer - Right Knee Immobilizer - Right: Discontinue once straight leg raise with < 10 degree lag Restrictions Weight Bearing Restrictions: No Other Position/Activity Restrictions: WBAT    Mobility  Bed Mobility Overal bed mobility: Needs Assistance Bed Mobility: Sit to Supine       Sit to supine: HOB elevated;Min guard   General bed mobility comments: Cues for safety, technique. Increased time.  Transfers Overall transfer level: Needs assistance Equipment used: Rolling walker (2 wheeled) Transfers: Sit to/from Stand Sit to Stand: Min assist         General transfer comment: VCs safety, technique, hand/LE placement. Small amount of assist to steady.  Ambulation/Gait Ambulation/Gait assistance: Min guard Gait Distance (Feet): 85 Feet Assistive device: Rolling walker (2 wheeled) Gait Pattern/deviations: Step-to pattern;Antalgic     General Gait Details: VCs safety, technique, sequence. Slow gait speed. Close guard for safety   Stairs             Wheelchair Mobility    Modified Rankin (Stroke Patients Only)       Balance Overall balance assessment: Mild deficits observed, not formally tested                                          Cognition Arousal/Alertness: Awake/alert Behavior During Therapy: WFL for tasks  assessed/performed Overall Cognitive Status: Within Functional Limits for tasks assessed                                        Exercises      General Comments        Pertinent Vitals/Pain Pain Assessment: 0-10 Pain Score: 5  Pain Location: R knee Pain Descriptors / Indicators: Aching;Discomfort;Grimacing;Guarding;Sharp Pain Intervention(s): Monitored during session;Repositioned;Ice applied    Home Living                      Prior Function            PT Goals (current goals can now be found in the care plan section) Progress towards PT goals: Progressing toward goals    Frequency    7X/week      PT Plan Current plan remains appropriate    Co-evaluation              AM-PAC PT "6 Clicks" Mobility   Outcome Measure  Help needed turning from your back to your side while in a flat bed without using bedrails?: A Little Help needed moving from lying on your back to sitting on the side of a flat bed without using bedrails?: A Little Help needed moving to and from a bed to a chair (including a wheelchair)?: A  Little Help needed standing up from a chair using your arms (e.g., wheelchair or bedside chair)?: A Little Help needed to walk in hospital room?: A Little Help needed climbing 3-5 steps with a railing? : A Little 6 Click Score: 18    End of Session Equipment Utilized During Treatment: Gait belt;Right knee immobilizer Activity Tolerance: Patient tolerated treatment well Patient left: in bed;with call bell/phone within reach   PT Visit Diagnosis: Pain;Other abnormalities of gait and mobility (R26.89) Pain - Right/Left: Right Pain - part of body: Knee     Time: 5883-2549 PT Time Calculation (min) (ACUTE ONLY): 18 min  Charges:  $Gait Training: 8-22 mins                        [name], [place] Pager: [phone] Office: [phone]

## 2018-07-25 NOTE — Progress Notes (Signed)
Physical Therapy Treatment Patient Details Name: Douglas Lane MRN: [id number] DOB: [birth year] Today's Date: 07/25/2018    History of Present Illness 72 yo male s/p R TKA 07/24/18. Hx of Sz, chronic back pain.     PT Comments    Progressing with mobility. Pt is still having issues with N/V. Will progress activity as able.    Follow Up Recommendations  Follow surgeon's recommendation for DC plan and follow-up therapies     Equipment Recommendations  None recommended by PT    Recommendations for Other Services       Precautions / Restrictions Precautions Precautions: Fall;Knee Required Braces or Orthoses: Knee Immobilizer - Right Knee Immobilizer - Right: Discontinue once straight leg raise with < 10 degree lag Restrictions Weight Bearing Restrictions: No Other Position/Activity Restrictions: WBAT    Mobility  Bed Mobility Overal bed mobility: Needs Assistance Bed Mobility: Supine to Sit     Supine to sit: Min guard;HOB elevated     General bed mobility comments: Cues for safety, technique. Increased time.  Transfers Overall transfer level: Needs assistance Equipment used: Rolling walker (2 wheeled) Transfers: Sit to/from Stand Sit to Stand: Min assist         General transfer comment: VCs safety, technique, hand/LE placement. Small amount of assist to steady.  Ambulation/Gait Ambulation/Gait assistance: Min assist Gait Distance (Feet): 60 Feet Assistive device: Rolling walker (2 wheeled) Gait Pattern/deviations: Step-to pattern;Antalgic     General Gait Details: VCs safety, technique, sequence. Slow gait speed. Distance limited by pain and nausea. Assist to steady intermittently.    Stairs             Wheelchair Mobility    Modified Rankin (Stroke Patients Only)       Balance Overall balance assessment: Mild deficits observed, not formally tested         Standing balance support: Bilateral upper extremity supported Standing  balance-Leahy Scale: Poor                              Cognition Arousal/Alertness: Awake/alert Behavior During Therapy: WFL for tasks assessed/performed Overall Cognitive Status: Within Functional Limits for tasks assessed                                        Exercises Total Joint Exercises Ankle Circles/Pumps: AROM;Both;10 reps;Supine Quad Sets: AROM;Both;10 reps;Supine Heel Slides: AAROM;Right;10 reps;Supine Hip ABduction/ADduction: AAROM;Right;10 reps;Supine Straight Leg Raises: AAROM;AROM;Right;10 reps;Supine Goniometric ROM: ~5-55 degrees    General Comments        Pertinent Vitals/Pain Pain Assessment: 0-10 Pain Score: 7  Pain Location: R knee Pain Descriptors / Indicators: Aching;Discomfort;Grimacing;Guarding;Sharp Pain Intervention(s): Monitored during session;Repositioned;Ice applied    Home Living                      Prior Function            PT Goals (current goals can now be found in the care plan section) Progress towards PT goals: Progressing toward goals    Frequency    7X/week      PT Plan Current plan remains appropriate    Co-evaluation              AM-PAC PT "6 Clicks" Mobility   Outcome Measure  Help needed turning from your back to your side while in a flat bed  without using bedrails?: A Little Help needed moving from lying on your back to sitting on the side of a flat bed without using bedrails?: A Little Help needed moving to and from a bed to a chair (including a wheelchair)?: A Little Help needed standing up from a chair using your arms (e.g., wheelchair or bedside chair)?: A Little Help needed to walk in hospital room?: A Little Help needed climbing 3-5 steps with a railing? : A Little 6 Click Score: 18    End of Session Equipment Utilized During Treatment: Gait belt;Right knee immobilizer Activity Tolerance: Patient limited by pain(Limited by N/V) Patient left: in chair;with call  bell/phone within reach;with family/visitor present   PT Visit Diagnosis: Pain;Other abnormalities of gait and mobility (R26.89) Pain - Right/Left: Right Pain - part of body: Knee     Time: 5956-3875 PT Time Calculation (min) (ACUTE ONLY): 27 min  Charges:  $Gait Training: 8-22 mins $Therapeutic Exercise: 8-22 mins                        [name], PT Acute Rehabilitation Services Pager: [phone] Office: [phone]

## 2018-07-25 NOTE — Care Management Note (Signed)
Case Management Note  Patient Details  Name: Douglas Lane MRN: [id number] Date of Birth: 01-10-1946  Subjective/Objective:  Spoke with patient at bedside. Confirmed plan for OP PT, already arranged. Has RW and 3n1. [phone]                  Action/Plan:   Expected Discharge Date:  07/25/18               Expected Discharge Plan:  OP Rehab  In-House Referral:  NA  Discharge planning Services  CM Consult  Post Acute Care Choice:  NA Choice offered to:  Patient  DME Arranged:  N/A DME Agency:  NA  HH Arranged:  NA HH Agency:  NA  Status of Service:  Completed, signed off  If discussed at [place] of Stay Meetings, dates discussed:    Additional Comments:  Guadalupe Maple, RN 07/25/2018, 11:31 AM

## 2018-07-25 NOTE — Addendum Note (Signed)
Addendum  created 07/25/18 0855 by Lollie Sails, CRNA   Charge Capture section accepted

## 2018-07-25 NOTE — Progress Notes (Signed)
Subjective: 1 Day Post-Op Procedure(s) (LRB): RIGHT TOTAL KNEE ARTHROPLASTY (Right) Patient reports pain as moderate.   Patient seen in rounds by Dr. Wynelle Link. Patient is well, and has had no acute complaints or problems other than pain in the right knee. Foley catheter removed this AM. Denies chest pain, SOB, or calf pain. No issues overnight.  We will continue therapy today.   Objective: Vital signs in last 24 hours: Temp:  [97.5 F (36.4 C)-98.4 F (36.9 C)] 98.4 F (36.9 C) (12/10 0431) Pulse Rate:  [51-108] 70 (12/10 0431) Resp:  [8-19] 17 (12/10 0431) BP: (101-163)/(55-103) 104/78 (12/10 0431) SpO2:  [92 %-99 %] 97 % (12/10 0431) Weight:  [86.6 kg] 86.6 kg (12/09 0737)  Intake/Output from previous day:  Intake/Output Summary (Last 24 hours) at 07/25/2018 0733 Last data filed at 07/25/2018 0600 Gross per 24 hour  Intake 4027.74 ml  Output 977.5 ml  Net 3050.24 ml    Labs: Recent Labs    07/25/18 0347  HGB 12.1*   Recent Labs    07/25/18 0347  WBC 11.2*  RBC 4.02*  HCT 38.7*  PLT 123*   Recent Labs    07/25/18 0347  NA 137  K 5.0  CL 104  CO2 23  BUN 25*  CREATININE 1.16  GLUCOSE 137*  CALCIUM 8.3*   Exam: General - Patient is Alert and Oriented Extremity - Neurologically intact Neurovascular intact Sensation intact distally Dorsiflexion/Plantar flexion intact Dressing - dressing C/D/I Motor Function - intact, moving foot and toes well on exam.   Past Medical History:  Diagnosis Date  . Aortic atherosclerosis ([place]) 03/08/2017   CT  . Aortic stenosis    severe AS s/p TAVR (03/22/17) with a 3mm Edwards Sapien 3 THV via the TF approach  . Arthritis    "pain in my knees" (03/30/2016)  . Bilateral cataracts   . CAD S/P percutaneous coronary angioplasty 03/31/2016   a. CP/USA -> LHC 03/31/16: 90% prox-mid RCA s/p DES, 40% prox-mid LAD, 25% D2, 25% prox-mid Cx, elevated LVEDP 33.  . Chronic diastolic CHF (congestive heart failure) ([place])  03/31/2016   pt unaware, has family history  . Chronic lower back pain   . DDD (degenerative disc disease), lumbar   . Dyslipidemia   . Grade II diastolic dysfunction   . Hearing loss   . Heart murmur   . Heartburn    occ. takes TUMS  . Humeral fracture 07/2014   Right  . Kidney cysts    1.6 cm simple cyst right  . LVH (left ventricular hypertrophy)    a. severe basal septal hypertrophy by echo 03/2016.  . Mitral regurgitation    a. mild by echo 03/2016.  . Osteopenia 03/13/2009  . Psoriasis   . Rheumatic fever    as a child  . Scoliosis 03/13/2009  . 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 07/2015, absent seizure  . Sinus bradycardia - baseline HR 50s at times.   . Sleep apnea    never had sleep study done, pt. unaware  . 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 Thrombosed hemorrhoids     Assessment/Plan: 1 Day Post-Op Procedure(s) (LRB): RIGHT TOTAL KNEE ARTHROPLASTY (Right) Principal Problem:   OA (osteoarthritis) of knee  Estimated body mass index is 30.83 kg/m as calculated from the following:   Height as of this encounter: 5\' 6"  (1.676 m).   Weight as of this  encounter: 86.6 kg. Advance diet Up with therapy  Anticipated LOS equal to or greater than 2 midnights due to - Age 70 and older with one or more of the following:  - Obesity  - Expected need for hospital services (PT, OT, Nursing) required for safe  discharge  - Anticipated need for postoperative skilled nursing care or inpatient rehab  - Active co-morbidities: Coronary Artery Disease and Heart Failure OR   - Unanticipated findings during/Post Surgery: None  - Patient is a high risk of re-admission due to: None    DVT Prophylaxis - Xarelto Weight bearing as tolerated. D/C O2 and pulse ox and try on room air. Hemovac pulled without difficulty, will continue therapy today.  Plan is to go Home after hospital stay. Plan for  discharge tomorrow if progresses with therapy.  Theresa Duty, PA-C Orthopedic Surgery 07/25/2018, 7:33 AM

## 2018-07-25 NOTE — Progress Notes (Signed)
Pt has voided 160mLs since foley was discontinued this AM. Fluids continued; NS at 4ml/hr.  Bladder scan shows 164mL in bladder. Updated PA. Will continue to monitor.

## 2018-07-25 NOTE — Progress Notes (Signed)
Patient was able to void 250cc

## 2018-07-26 LAB — CBC
HCT: 35.3 % — ABNORMAL LOW (ref 39.0–52.0)
Hemoglobin: 11.1 g/dL — ABNORMAL LOW (ref 13.0–17.0)
MCH: 30.1 pg (ref 26.0–34.0)
MCHC: 31.4 g/dL (ref 30.0–36.0)
MCV: 95.7 fL (ref 80.0–100.0)
Platelets: 112 10*3/uL — ABNORMAL LOW (ref 150–400)
RBC: 3.69 MIL/uL — ABNORMAL LOW (ref 4.22–5.81)
RDW: 13.5 % (ref 11.5–15.5)
WBC: 13.6 10*3/uL — ABNORMAL HIGH (ref 4.0–10.5)
nRBC: 0 % (ref 0.0–0.2)

## 2018-07-26 LAB — BASIC METABOLIC PANEL
Anion gap: 7 (ref 5–15)
BUN: 33 mg/dL — ABNORMAL HIGH (ref 8–23)
CHLORIDE: 108 mmol/L (ref 98–111)
CO2: 25 mmol/L (ref 22–32)
Calcium: 8.8 mg/dL — ABNORMAL LOW (ref 8.9–10.3)
Creatinine, Ser: 1.07 mg/dL (ref 0.61–1.24)
Glucose, Bld: 143 mg/dL — ABNORMAL HIGH (ref 70–99)
Potassium: 4.4 mmol/L (ref 3.5–5.1)
Sodium: 140 mmol/L (ref 135–145)

## 2018-07-26 LAB — GLUCOSE, CAPILLARY
Glucose-Capillary: 116 mg/dL — ABNORMAL HIGH (ref 70–99)
Glucose-Capillary: 97 mg/dL (ref 70–99)

## 2018-07-26 NOTE — Progress Notes (Signed)
Reviewed discharge instructions, medications and prescriptions. Patient and wife verbalizes understanding and has no further questions.

## 2018-07-26 NOTE — Progress Notes (Signed)
Subjective: 2 Days Post-Op Procedure(s) (LRB): RIGHT TOTAL KNEE ARTHROPLASTY (Right) Patient reports pain as mild.   Patient seen in rounds with Dr. Wynelle Link. Patient was having difficulty urinating status-post foley catheter removal yesterday. However this has resolved and patient has been voiding without difficulty. States he is ready to go home. Endorses good appetite. Positive flatus. Denies chest pain, SOB, or calf pain. Plan is to go Home after hospital stay.  Objective: Vital signs in last 24 hours: Temp:  [97.5 F (36.4 C)-98.1 F (36.7 C)] 98.1 F (36.7 C) (12/11 0555) Pulse Rate:  [64-74] 69 (12/11 0555) Resp:  [16-18] 18 (12/11 0555) BP: (117-127)/(71-72) 117/71 (12/11 0555) SpO2:  [92 %-97 %] 97 % (12/11 0555)  Intake/Output from previous day:  Intake/Output Summary (Last 24 hours) at 07/26/2018 0822 Last data filed at 07/26/2018 0813 Gross per 24 hour  Intake 1478.83 ml  Output 1350 ml  Net 128.83 ml    Intake/Output this shift: Total I/O In: 240 [P.O.:240] Out: 400 [Urine:400]  Labs: Recent Labs    07/25/18 0347 07/26/18 0351  HGB 12.1* 11.1*   Recent Labs    07/25/18 0347 07/26/18 0351  WBC 11.2* 13.6*  RBC 4.02* 3.69*  HCT 38.7* 35.3*  PLT 123* 112*   Recent Labs    07/25/18 0347 07/26/18 0351  NA 137 140  K 5.0 4.4  CL 104 108  CO2 23 25  BUN 25* 33*  CREATININE 1.16 1.07  GLUCOSE 137* 143*  CALCIUM 8.3* 8.8*   Exam: General - Patient is Alert and Oriented Extremity - Neurologically intact Neurovascular intact Sensation intact distally Dorsiflexion/Plantar flexion intact Dressing/Incision - clean, dry, no drainage Motor Function - intact, moving foot and toes well on exam.   Past Medical History:  Diagnosis Date  . Aortic atherosclerosis ([place]) 03/08/2017   CT  . Aortic stenosis    severe AS s/p TAVR (03/22/17) with a 43mm Edwards Sapien 3 THV via the TF approach  . Arthritis    "pain in my knees" (03/30/2016)  . Bilateral  cataracts   . CAD S/P percutaneous coronary angioplasty 03/31/2016   a. CP/USA -> LHC 03/31/16: 90% prox-mid RCA s/p DES, 40% prox-mid LAD, 25% D2, 25% prox-mid Cx, elevated LVEDP 33.  . Chronic diastolic CHF (congestive heart failure) ([place]) 03/31/2016   pt unaware, has family history  . Chronic lower back pain   . DDD (degenerative disc disease), lumbar   . Dyslipidemia   . Grade II diastolic dysfunction   . Hearing loss   . Heart murmur   . Heartburn    occ. takes TUMS  . Humeral fracture 07/2014   Right  . Kidney cysts    1.6 cm simple cyst right  . LVH (left ventricular hypertrophy)    a. severe basal septal hypertrophy by echo 03/2016.  . Mitral regurgitation    a. mild by echo 03/2016.  . Osteopenia 03/13/2009  . Psoriasis   . Rheumatic fever    as a child  . Scoliosis 03/13/2009  . 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 07/2015, absent seizure  . Sinus bradycardia - baseline HR 50s at times.   . Sleep apnea    never had sleep study done, pt. unaware  . 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 Thrombosed hemorrhoids     Assessment/Plan: 2 Days Post-Op Procedure(s) (LRB): RIGHT TOTAL KNEE ARTHROPLASTY (Right) Principal Problem:   OA (  osteoarthritis) of knee  Estimated body mass index is 30.83 kg/m as calculated from the following:   Height as of this encounter: 5\' 6"  (1.676 m).   Weight as of this encounter: 86.6 kg. Up with therapy D/C IV fluids  DVT Prophylaxis - Xarelto Weight-bearing as tolerated  Plan for discharge to home today. Scheduled for outpatient physical therapy at [facility]). Follow-up in the office in 2 weeks with Dr. Wynelle Link.  Theresa Duty, PA-C Orthopedic Surgery 07/26/2018, 8:22 AM

## 2018-07-26 NOTE — Progress Notes (Signed)
Physical Therapy Treatment Patient Details Name: Douglas Lane MRN: [id number] DOB: Feb 23, 1946 Today's Date: 07/26/2018    History of Present Illness 72 yo male s/p R TKA 07/24/18. Hx of Sz, chronic back pain.     PT Comments    Pt ambulated in hallway and practiced steps.  Pt requiring assist with use of SPC on steps so will return this afternoon to continue stair training, also when spouse available.  Pt performed LE exercises in supine.   Follow Up Recommendations  Follow surgeon's recommendation for DC plan and follow-up therapies     Equipment Recommendations  None recommended by PT    Recommendations for Other Services       Precautions / Restrictions Precautions Precautions: Fall;Knee Restrictions Other Position/Activity Restrictions: WBAT    Mobility  Bed Mobility Overal bed mobility: Needs Assistance Bed Mobility: Sit to Supine     Supine to sit: Min guard;HOB elevated     General bed mobility comments: Cues for safety, technique. Increased time required  Transfers Overall transfer level: Needs assistance Equipment used: Rolling walker (2 wheeled) Transfers: Sit to/from Stand Sit to Stand: Min assist         General transfer comment: verbal cues for safety and UE and LE positioning. Small amount of assist to steady.  Ambulation/Gait Ambulation/Gait assistance: Min guard Gait Distance (Feet): 120 Feet Assistive device: Rolling walker (2 wheeled) Gait Pattern/deviations: Step-to pattern;Decreased stance time - right;Antalgic     General Gait Details: verbal cues for sequence, RW positioning, step length   Stairs Stairs: Yes Stairs assistance: Min assist Stair Management: Step to pattern;Forwards;One rail Right;With cane Number of Stairs: 2 General stair comments: verbal cues for sequence, safety, SPC positioning, and assist required to steady   Wheelchair Mobility    Modified Rankin (Stroke Patients Only)       Balance                                             Cognition Arousal/Alertness: Awake/alert Behavior During Therapy: WFL for tasks assessed/performed Overall Cognitive Status: Within Functional Limits for tasks assessed                                        Exercises Total Joint Exercises Ankle Circles/Pumps: AROM;Both;10 reps;Supine Quad Sets: AROM;Both;10 reps;Supine Short Arc Quad: AAROM;10 reps;Right Heel Slides: AAROM;Right;10 reps;Supine Hip ABduction/ADduction: Right;10 reps;Supine;AROM Straight Leg Raises: Right;10 reps;Supine;AROM    General Comments        Pertinent Vitals/Pain Pain Assessment: 0-10 Pain Score: 5  Pain Location: R knee Pain Descriptors / Indicators: Aching;Sore;Discomfort Pain Intervention(s): Repositioned;Limited activity within patient's tolerance;Monitored during session    Home Living                      Prior Function            PT Goals (current goals can now be found in the care plan section) Progress towards PT goals: Progressing toward goals    Frequency    7X/week      PT Plan Current plan remains appropriate    Co-evaluation              AM-PAC PT "6 Clicks" Mobility   Outcome Measure  Help needed turning from your back to  your side while in a flat bed without using bedrails?: A Little Help needed moving from lying on your back to sitting on the side of a flat bed without using bedrails?: A Little Help needed moving to and from a bed to a chair (including a wheelchair)?: A Little Help needed standing up from a chair using your arms (e.g., wheelchair or bedside chair)?: A Little Help needed to walk in hospital room?: A Little Help needed climbing 3-5 steps with a railing? : A Lot 6 Click Score: 17    End of Session Equipment Utilized During Treatment: Gait belt;Right knee immobilizer Activity Tolerance: Patient tolerated treatment well Patient left: with call bell/phone within reach;in  chair   PT Visit Diagnosis: Pain;Other abnormalities of gait and mobility (R26.89) Pain - Right/Left: Right Pain - part of body: Knee     Time: 1610-9604 PT Time Calculation (min) (ACUTE ONLY): 24 min  Charges:  $Gait Training: 8-22 mins $Therapeutic Exercise: 8-22 mins                     Carmelia Bake, PT, DPT Acute Rehabilitation Services Office: [phone] Pager: [phone]  [name] 07/26/2018, 2:49 PM

## 2018-07-26 NOTE — Plan of Care (Signed)
  Problem: Clinical Measurements: Goal: Ability to maintain clinical measurements within normal limits will improve Outcome: Progressing Goal: Diagnostic test results will improve Outcome: Progressing Goal: Respiratory complications will improve Outcome: Progressing Goal: Cardiovascular complication will be avoided Outcome: Progressing   Problem: Elimination: Goal: Will not experience complications related to bowel motility Outcome: Progressing Goal: Will not experience complications related to urinary retention Outcome: Progressing   

## 2018-07-26 NOTE — Progress Notes (Signed)
Physical Therapy Treatment Patient Details Name: Douglas Lane MRN: [id number] DOB: Aug 16, 1946 Today's Date: 07/26/2018    History of Present Illness 72 yo male s/p R TKA 07/24/18. Hx of Sz, chronic back pain.     PT Comments    Pt ambulated again in hallway and practiced safe stair technique with spouse present and assisting with RW.  Pt and spouse report understanding.  Pt feels ready to d/c home today.  HEP handout provided.  Follow Up Recommendations  Follow surgeon's recommendation for DC plan and follow-up therapies     Equipment Recommendations  None recommended by PT    Recommendations for Other Services       Precautions / Restrictions Precautions Precautions: Fall;Knee Restrictions Other Position/Activity Restrictions: WBAT    Mobility  Bed Mobility Overal bed mobility: Needs Assistance Bed Mobility: Supine to Sit     Supine to sit: Min guard;HOB elevated     General bed mobility comments: Cues for safety, technique. Increased time required  Transfers Overall transfer level: Needs assistance Equipment used: Rolling walker (2 wheeled) Transfers: Sit to/from Stand Sit to Stand: Min guard         General transfer comment: verbal cues for safety and UE and LE positioning  Ambulation/Gait Ambulation/Gait assistance: Min guard Gait Distance (Feet): 110 Feet Assistive device: Rolling walker (2 wheeled) Gait Pattern/deviations: Step-to pattern;Decreased stance time - right;Antalgic     General Gait Details: verbal cues for sequence, RW positioning, step length   Stairs Stairs: Yes Stairs assistance: Min guard Stair Management: Step to pattern;Backwards;With walker Number of Stairs: 2 General stair comments: verbal cues for sequence, safety, assistive device positioning, performed first with [facility] however pt requires steadying assist; performed with RW and Spouse assisted with RW and pt's steadiness improved, encouraged to perform with RW at home, pt  and spouse report understanding   Wheelchair Mobility    Modified Rankin (Stroke Patients Only)       Balance                                            Cognition Arousal/Alertness: Awake/alert Behavior During Therapy: WFL for tasks assessed/performed Overall Cognitive Status: Within Functional Limits for tasks assessed                                        Exercises     General Comments        Pertinent Vitals/Pain Pain Assessment: 0-10 Pain Score: 4  Pain Location: R knee Pain Descriptors / Indicators: Aching;Sore;Discomfort Pain Intervention(s): Repositioned;Limited activity within patient's tolerance;Monitored during session    Home Living                      Prior Function            PT Goals (current goals can now be found in the care plan section) Progress towards PT goals: Progressing toward goals    Frequency    7X/week      PT Plan Current plan remains appropriate    Co-evaluation              AM-PAC PT "6 Clicks" Mobility   Outcome Measure  Help needed turning from your back to your side while in a flat bed without using bedrails?:  A Little Help needed moving from lying on your back to sitting on the side of a flat bed without using bedrails?: A Little Help needed moving to and from a bed to a chair (including a wheelchair)?: A Little Help needed standing up from a chair using your arms (e.g., wheelchair or bedside chair)?: A Little Help needed to walk in hospital room?: A Little Help needed climbing 3-5 steps with a railing? : A Little 6 Click Score: 18    End of Session Equipment Utilized During Treatment: Gait belt Activity Tolerance: Patient tolerated treatment well Patient left: with call bell/phone within reach;in bed;with family/visitor present   PT Visit Diagnosis: Pain;Other abnormalities of gait and mobility (R26.89) Pain - Right/Left: Right Pain - part of body: Knee      Time: 1400-1415 PT Time Calculation (min) (ACUTE ONLY): 15 min  Charges:  $Gait Training: 8-22 mins                    Carmelia Bake, PT, DPT Acute Rehabilitation Services Office: [phone] Pager: [phone]  [name] 07/26/2018, 2:54 PM

## 2018-07-28 ENCOUNTER — Encounter: Payer: Self-pay | Admitting: Physical Therapy

## 2018-07-28 ENCOUNTER — Ambulatory Visit: Payer: Medicare Other | Attending: Student | Admitting: Physical Therapy

## 2018-07-28 DIAGNOSIS — R600 Localized edema: Secondary | ICD-10-CM | POA: Insufficient documentation

## 2018-07-28 DIAGNOSIS — R262 Difficulty in walking, not elsewhere classified: Secondary | ICD-10-CM | POA: Insufficient documentation

## 2018-07-28 DIAGNOSIS — M25561 Pain in right knee: Secondary | ICD-10-CM | POA: Insufficient documentation

## 2018-07-28 DIAGNOSIS — R2689 Other abnormalities of gait and mobility: Secondary | ICD-10-CM | POA: Diagnosis present

## 2018-07-28 DIAGNOSIS — M25661 Stiffness of right knee, not elsewhere classified: Secondary | ICD-10-CM | POA: Insufficient documentation

## 2018-07-28 NOTE — Therapy (Signed)
[facility] [address] [place], [place], [address] Phone: [phone]   Fax:  [phone]  Physical Therapy Evaluation  Patient Details  Name: Douglas Lane MRN: [id number] Date of Birth: 1946/05/25 Referring Provider (PT): Aluisio   Encounter Date: 07/28/2018  PT End of Session - 07/28/18 1116    Visit Number  1    Date for PT Re-Evaluation  09/28/18    PT Start Time  1660    PT Stop Time  1138    PT Time Calculation (min)  55 min    Activity Tolerance  Patient tolerated treatment well    Behavior During Therapy  Restless;Agitated       Past Medical History:  Diagnosis Date  . Aortic atherosclerosis ([place]) 03/08/2017   CT  . Aortic stenosis    severe AS s/p TAVR (03/22/17) with a 22mm Edwards Sapien 3 THV via the TF approach  . Arthritis    "pain in my knees" (03/30/2016)  . Bilateral cataracts   . CAD S/P percutaneous coronary angioplasty 03/31/2016   a. CP/USA -> LHC 03/31/16: 90% prox-mid RCA s/p DES, 40% prox-mid LAD, 25% D2, 25% prox-mid Cx, elevated LVEDP 33.  . Chronic diastolic CHF (congestive heart failure) ([place]) 03/31/2016   pt unaware, has family history  . Chronic lower back pain   . DDD (degenerative disc disease), lumbar   . Dyslipidemia   . Grade II diastolic dysfunction   . Hearing loss   . Heart murmur   . Heartburn    occ. takes TUMS  . Humeral fracture 07/2014   Right  . Kidney cysts    1.6 cm simple cyst right  . LVH (left ventricular hypertrophy)    a. severe basal septal hypertrophy by echo 03/2016.  . Mitral regurgitation    a. mild by echo 03/2016.  . Osteopenia 03/13/2009  . Psoriasis   . Rheumatic fever    as a child  . Scoliosis 03/13/2009  . Seizure ([place]) 01/2011; 07/2011; 07/2015   "idiopathic; been on Kepra since 07/2011", last seizure poss. 07/2015, absent seizure  . Sinus bradycardia - baseline HR 50s at times.   . Sleep apnea    never had sleep study done, pt.  unaware  . 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 Thrombosed hemorrhoids     Past Surgical History:  Procedure Laterality Date  . APPENDECTOMY    . BACK SURGERY    . CARDIAC CATHETERIZATION N/A 03/30/2016   Procedure: Right/Left Heart Cath and Coronary Angiography;  Surgeon: Jettie Booze, MD;  Location: [facility];  Service: Cardiovascular;  Laterality: N/A;  . CARDIAC CATHETERIZATION N/A 03/30/2016   Procedure: Coronary Stent Intervention;  Surgeon: Jettie Booze, MD;  Location: [facility];  Service: Cardiovascular;  Laterality: N/A;  . CATARACT EXTRACTION, BILATERAL    . COLONOSCOPY    . CORONARY ANGIOPLASTY    . FOREARM FRACTURE SURGERY Right ~ 2003-2004   "got steel plate in there"  . HERNIA REPAIR  2009  . I&D hemorrhoirds    . KNEE ARTHROSCOPY Bilateral   . MOHS SURGERY Left    temple  . ORIF SCAPULAR FRACTURE Right    right  . POSTERIOR LUMBAR FUSION  2004  . TEE WITHOUT CARDIOVERSION N/A 11/01/2016   Procedure: TRANSESOPHAGEAL ECHOCARDIOGRAM (TEE);  Surgeon: Sanda Klein, MD;  Location: [facility];  Service: Cardiovascular;  Laterality: N/A;  . TEE WITHOUT CARDIOVERSION  N/A 03/22/2017   Procedure: TRANSESOPHAGEAL ECHOCARDIOGRAM (TEE);  Surgeon: Sherren Mocha, MD;  Location: [facility];  Service: Open Heart Surgery;  Laterality: N/A;  . TONSILLECTOMY    . TOTAL KNEE ARTHROPLASTY Right 07/24/2018   Procedure: RIGHT TOTAL KNEE ARTHROPLASTY;  Surgeon: Gaynelle Arabian, MD;  Location: [facility];  Service: Orthopedics;  Laterality: Right;  46min  . TRANSCATHETER AORTIC VALVE REPLACEMENT, TRANSFEMORAL N/A 03/22/2017   Procedure: TRANSCATHETER AORTIC VALVE REPLACEMENT, TRANSFEMORAL;  Surgeon: Sherren Mocha, MD;  Location: [facility];  Service: Open Heart Surgery;  Laterality: N/A;    There were no vitals filed for this visit.   Subjective Assessment - 07/28/18 1046    Subjective  Patient underwent a right TKR on 07/24/18.  He reports that he had many  years of knee pain, he had varus of the knee, the left knee has significatn varus.  He stayed 2 nights in thye hospital and went home Wednesday night    Limitations  Lifting;Standing;Walking;House hold activities    Currently in Pain?  Yes    Pain Score  7     Pain Location  Knee    Pain Orientation  Right    Pain Descriptors / Indicators  Aching    Pain Type  Acute pain;Surgical pain    Pain Onset  In the past 7 days    Pain Frequency  Constant    Aggravating Factors   standing, walking, any motions, "Hurts like hell"  pain 9-10/10    Pain Relieving Factors  takes pain meds, ices, at best pain a 3/10    Effect of Pain on Daily Activities  "limits everything"         [facility] PT Assessment - 07/28/18 0001      Assessment   Medical Diagnosis  right TKA    Referring Provider (PT)  Aluisio    Onset Date/Surgical Date  07/24/18    Prior Therapy  no      Precautions   Precautions  None      Balance Screen   Has the patient fallen in the past 6 months  No    Has the patient had a decrease in activity level because of a fear of falling?   No    Is the patient reluctant to leave their home because of a fear of falling?   No      Home Environment   Additional Comments  has stairs, in the past does housework, Haematologist, Forensic psychologist      Prior Function   Level of Independence  Independent    Vocation  Retired    Leisure  no exercise      Observation/Other Assessments-Edema    Edema  Circumferential      Circumferential Edema   Circumferential - Right  51.5 cm mid patella    Circumferential - Left   42.5 cm      ROM / Strength   AROM / PROM / Strength  AROM;PROM;Strength      AROM   Overall AROM Comments  Patient c/o a lot of pain with any motions    AROM Assessment Site  Knee    Right/Left Knee  Right    Right Knee Extension  25    Right Knee Flexion  70      PROM   PROM Assessment Site  Knee    Right/Left Knee  Right    Right Knee Extension  15    Right Knee Flexion   82  Strength   Overall Strength Comments  2+/5 due to pain      Palpation   Palpation comment  significant swelling, ballotable patella, very tender and sore, gaurded to palpation      Ambulation/Gait   Gait Comments  with a FWW, very unstable, slow, stiff legged, step to pattern      Standardized Balance Assessment   Standardized Balance Assessment  Timed Up and Go Test      Timed Up and Go Test   Normal TUG (seconds)  51                Objective measurements completed on examination: See above findings.      [facility] Adult PT Treatment/Exercise - 07/28/18 0001      Exercises   Exercises  Knee/Hip      Knee/Hip Exercises: Aerobic   Nustep  level 2 x 5 minutes      Modalities   Modalities  Vasopneumatic      Vasopneumatic   Number Minutes Vasopneumatic   15 minutes    Vasopnuematic Location   Knee    Vasopneumatic Pressure  Medium    Vasopneumatic Temperature   35             PT Education - 07/28/18 1115    Education Details  low load long duration flex/extend    Person(s) Educated  Patient    Methods  Explanation;Handout;Demonstration    Comprehension  Verbalized understanding       PT Short Term Goals - 07/28/18 1131      PT SHORT TERM GOAL #1   Title  independent with intiial HEP    Time  2    Period  Weeks        PT Long Term Goals - 07/28/18 1131      PT LONG TERM GOAL #1   Title  ambulate community distances without device or with SPC    Time  12    Period  Weeks    Status  New      PT LONG TERM GOAL #2   Title  decrease pain 50%    Period  Weeks    Status  New      PT LONG TERM GOAL #3   Title  increase AROM to 5-115 degrees flexion    Time  12    Period  Weeks    Status  New      PT LONG TERM GOAL #4   Title  increase strength to 4+/5    Time  12    Period  Weeks    Status  New      PT LONG TERM GOAL #5   Title  decrease swelling 2 cm    Time  12    Period  Weeks    Status  New             Plan  - 07/28/18 1116    Clinical Impression Statement  Patient underwent right TKR on 07/24/18.  He reports he is in a lot of pain, he is frustrated at the pain level and the swelling, I tried to explain the surgery and that this is a painful and long recovery.  He has significant swelling, he walks stiff legged, with FWW, slow, step to gait, he has significant valgus of the non operative side.  His AROM is 25-70 degrees flexion.    Clinical Presentation  Evolving    Clinical Decision Making  Low  Rehab Potential  Good    PT Frequency  3x / week    PT Duration  8 weeks    PT Treatment/Interventions  ADLs/Self Care Home Management;Electrical Stimulation;Gait training;Stair training;Functional mobility training;Therapeutic activities;Therapeutic exercise;Patient/family education;Neuromuscular re-education;Balance training;Manual techniques;Vasopneumatic Device;Passive range of motion    PT Next Visit Plan  slowly progress for ROM, strength, gait and function    Consulted and Agree with Plan of Care  Patient       Patient will benefit from skilled therapeutic intervention in order to improve the following deficits and impairments:  Abnormal gait, Pain, Decreased scar mobility, Cardiopulmonary status limiting activity, Decreased activity tolerance, Decreased range of motion, Decreased strength, Decreased balance, Difficulty walking, Increased edema, Impaired flexibility  Visit Diagnosis: Acute pain of right knee - Plan: PT plan of care cert/re-cert  Stiffness of right knee, not elsewhere classified - Plan: PT plan of care cert/re-cert  Localized edema - Plan: PT plan of care cert/re-cert  Difficulty in walking, not elsewhere classified - Plan: PT plan of care cert/re-cert     Problem List Patient Active Problem List   Diagnosis Date Noted  . OA (osteoarthritis) of knee 07/24/2018  . Chronic diastolic heart failure ([place]) 11/13/2017  . Benign essential HTN 11/13/2017  . S/P TAVR (transcatheter  aortic valve replacement) 03/22/2017  . Severe aortic stenosis 03/22/2017  . Bradycardia, drug induced 10/28/2016  . Dyslipidemia 03/31/2016  . Mitral regurgitation 03/31/2016  . CAD S/P percutaneous coronary angioplasty 03/31/2016  . Abnormal nuclear stress test   . Heart murmur 03/06/2015  . Abnormal EKG 03/06/2015  . LVH (left ventricular hypertrophy) 03/06/2015  . Rheumatic fever   . Localization-related idiopathic epilepsy and epileptic syndromes with seizures of localized onset, not intractable, without status epilepticus ([place]) 08/23/2014  . Hemorrhoid, thrombosed. 12/24/2011    Sumner Boast., PT 07/28/2018, 11:37 AM  [facility] [address] [place], [place], [address] Phone: [phone]   Fax:  [phone]  Name: PHUOC HUY MRN: [id number] Date of Birth: [birth year]

## 2018-07-31 ENCOUNTER — Ambulatory Visit: Payer: Medicare Other | Admitting: Physical Therapy

## 2018-07-31 ENCOUNTER — Encounter: Payer: Self-pay | Admitting: Physical Therapy

## 2018-07-31 DIAGNOSIS — R262 Difficulty in walking, not elsewhere classified: Secondary | ICD-10-CM

## 2018-07-31 DIAGNOSIS — M25561 Pain in right knee: Secondary | ICD-10-CM | POA: Diagnosis not present

## 2018-07-31 DIAGNOSIS — M25661 Stiffness of right knee, not elsewhere classified: Secondary | ICD-10-CM

## 2018-07-31 DIAGNOSIS — R600 Localized edema: Secondary | ICD-10-CM

## 2018-07-31 NOTE — Discharge Summary (Signed)
Physician Discharge Summary   Patient ID: AMAURIS DEBOIS MRN: [id number] DOB/AGE: 1946/05/14 72 y.o.  Admit date: 07/24/2018 Discharge date: 07/26/2018  Primary Diagnosis: Osteoarthritis, right knee   Admission Diagnoses:  Past Medical History:  Diagnosis Date  . Aortic atherosclerosis ([place]) 03/08/2017   CT  . Aortic stenosis    severe AS s/p TAVR (03/22/17) with a 37mm Edwards Sapien 3 THV via the TF approach  . Arthritis    "pain in my knees" (03/30/2016)  . Bilateral cataracts   . CAD S/P percutaneous coronary angioplasty 03/31/2016   a. CP/USA -> LHC 03/31/16: 90% prox-mid RCA s/p DES, 40% prox-mid LAD, 25% D2, 25% prox-mid Cx, elevated LVEDP 33.  . Chronic diastolic CHF (congestive heart failure) ([place]) 03/31/2016   pt unaware, has family history  . Chronic lower back pain   . DDD (degenerative disc disease), lumbar   . Dyslipidemia   . Grade II diastolic dysfunction   . Hearing loss   . Heart murmur   . Heartburn    occ. takes TUMS  . Humeral fracture 07/2014   Right  . Kidney cysts    1.6 cm simple cyst right  . LVH (left ventricular hypertrophy)    a. severe basal septal hypertrophy by echo 03/2016.  . Mitral regurgitation    a. mild by echo 03/2016.  . Osteopenia 03/13/2009  . Psoriasis   . Rheumatic fever    as a child  . Scoliosis 03/13/2009  . 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 07/2015, absent seizure  . Sinus bradycardia - baseline HR 50s at times.   . Sleep apnea    never had sleep study done, pt. unaware  . 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 Thrombosed hemorrhoids    Discharge Diagnoses:   Principal Problem:   OA (osteoarthritis) of knee  Estimated body mass index is 30.83 kg/m as calculated from the following:   Height as of this encounter: 5\' 6"  (1.676 m).   Weight as of this encounter: 86.6 kg.  Procedure:  Procedure(s) (LRB): RIGHT TOTAL KNEE ARTHROPLASTY  (Right)   Consults: None  HPI: Douglas Lane is a 72 y.o. year old male with end stage OA of his right knee with progressively worsening pain and dysfunction. He has constant pain, with activity and at rest and significant functional deficits with difficulties even with ADLs. He has had extensive non-op management including analgesics, injections of cortisone and viscosupplements, and home exercise program, but remains in significant pain with significant dysfunction. Radiographs show bone on bone arthritis medial and patellofemoral. He presents now for right Total Knee Arthroplasty.  Laboratory Data: Admission on 07/24/2018, Discharged on 07/26/2018  Component Date Value Ref Range Status  . WBC 07/25/2018 11.2* 4.0 - 10.5 K/uL Final  . RBC 07/25/2018 4.02* 4.22 - 5.81 MIL/uL Final  . Hemoglobin 07/25/2018 12.1* 13.0 - 17.0 g/dL Final  . HCT 07/25/2018 38.7* 39.0 - 52.0 % Final  . MCV 07/25/2018 96.3  80.0 - 100.0 fL Final  . MCH 07/25/2018 30.1  26.0 - 34.0 pg Final  . MCHC 07/25/2018 31.3  30.0 - 36.0 g/dL Final  . RDW 07/25/2018 13.2  11.5 - 15.5 % Final  . Platelets 07/25/2018 123* 150 - 400 K/uL Final   Comment: Immature Platelet Fraction may be clinically indicated, consider ordering this additional test [id number]   . nRBC 07/25/2018 0.0  0.0 - 0.2 % Final  Performed at [facility], [place] [address] [place], [place] [address]  . Sodium 07/25/2018 137  135 - 145 mmol/L Final  . Potassium 07/25/2018 5.0  3.5 - 5.1 mmol/L Final  . Chloride 07/25/2018 104  98 - 111 mmol/L Final  . CO2 07/25/2018 23  22 - 32 mmol/L Final  . Glucose, Bld 07/25/2018 137* 70 - 99 mg/dL Final  . BUN 07/25/2018 25* 8 - 23 mg/dL Final  . Creatinine, Ser 07/25/2018 1.16  0.61 - 1.24 mg/dL Final  . Calcium 07/25/2018 8.3* 8.9 - 10.3 mg/dL Final  . GFR calc non Af Amer 07/25/2018 >60  >60 mL/min Final  . GFR calc Af Amer 07/25/2018 >60  >60 mL/min Final  . Anion gap 07/25/2018 10  5 -  15 Final   Performed at [facility], [place] [address] [place], [place] [address]  . WBC 07/26/2018 13.6* 4.0 - 10.5 K/uL Final  . RBC 07/26/2018 3.69* 4.22 - 5.81 MIL/uL Final  . Hemoglobin 07/26/2018 11.1* 13.0 - 17.0 g/dL Final  . HCT 07/26/2018 35.3* 39.0 - 52.0 % Final  . MCV 07/26/2018 95.7  80.0 - 100.0 fL Final  . MCH 07/26/2018 30.1  26.0 - 34.0 pg Final  . MCHC 07/26/2018 31.4  30.0 - 36.0 g/dL Final  . RDW 07/26/2018 13.5  11.5 - 15.5 % Final  . Platelets 07/26/2018 112* 150 - 400 K/uL Final   Comment: REPEATED TO VERIFY PLATELET COUNT CONFIRMED BY SMEAR Immature Platelet Fraction may be clinically indicated, consider ordering this additional test [id number]   . nRBC 07/26/2018 0.0  0.0 - 0.2 % Final   Performed at [place] [address], [place], [place] [address]  . Sodium 07/26/2018 140  135 - 145 mmol/L Final  . Potassium 07/26/2018 4.4  3.5 - 5.1 mmol/L Final  . Chloride 07/26/2018 108  98 - 111 mmol/L Final  . CO2 07/26/2018 25  22 - 32 mmol/L Final  . Glucose, Bld 07/26/2018 143* 70 - 99 mg/dL Final  . BUN 07/26/2018 33* 8 - 23 mg/dL Final  . Creatinine, Ser 07/26/2018 1.07  0.61 - 1.24 mg/dL Final  . Calcium 07/26/2018 8.8* 8.9 - 10.3 mg/dL Final  . GFR calc non Af Amer 07/26/2018 >60  >60 mL/min Final  . GFR calc Af Amer 07/26/2018 >60  >60 mL/min Final  . Anion gap 07/26/2018 7  5 - 15 Final   Performed at [facility], [place] [address], [place], [place] [address]  . Glucose-Capillary 07/25/2018 153* 70 - 99 mg/dL Final  . Glucose-Capillary 07/26/2018 116* 70 - 99 mg/dL Final  . Glucose-Capillary 07/26/2018 97  70 - 99 mg/dL Final  Hospital Outpatient Visit on 07/18/2018  Component Date Value Ref Range Status  . aPTT 07/18/2018 31  24 - 36 seconds Final   Performed at [facility], [place] [address], [place], [place] [address]  . WBC 07/18/2018 5.3  4.0 - 10.5 K/uL Final  . RBC  07/18/2018 4.95  4.22 - 5.81 MIL/uL Final  . Hemoglobin 07/18/2018 14.9  13.0 - 17.0 g/dL Final  . HCT 07/18/2018 46.9  39.0 - 52.0 % Final  . MCV 07/18/2018 94.7  80.0 - 100.0 fL Final  . MCH 07/18/2018 30.1  26.0 - 34.0 pg Final  . MCHC 07/18/2018 31.8  30.0 - 36.0 g/dL Final  . RDW 07/18/2018 13.0  11.5 - 15.5 % Final  . Platelets 07/18/2018 155  150 - 400 K/uL Final  . nRBC 07/18/2018 0.0  0.0 - 0.2 % Final   Performed at [facility], [place] [address], [place], [place] [address]  . Sodium 07/18/2018 139  135 - 145 mmol/L Final  . Potassium 07/18/2018 4.5  3.5 - 5.1 mmol/L Final  . Chloride 07/18/2018 105  98 - 111 mmol/L Final  . CO2 07/18/2018 28  22 - 32 mmol/L Final  . Glucose, Bld 07/18/2018 96  70 - 99 mg/dL Final  . BUN 07/18/2018 16  8 - 23 mg/dL Final  . Creatinine, Ser 07/18/2018 0.82  0.61 - 1.24 mg/dL Final  . Calcium 07/18/2018 9.1  8.9 - 10.3 mg/dL Final  . Total Protein 07/18/2018 6.3* 6.5 - 8.1 g/dL Final  . Albumin 07/18/2018 4.2  3.5 - 5.0 g/dL Final  . AST 07/18/2018 39  15 - 41 U/L Final  . ALT 07/18/2018 36  0 - 44 U/L Final  . Alkaline Phosphatase 07/18/2018 65  38 - 126 U/L Final  . Total Bilirubin 07/18/2018 1.8* 0.3 - 1.2 mg/dL Final  . GFR calc non Af Amer 07/18/2018 >60  >60 mL/min Final  . GFR calc Af Amer 07/18/2018 >60  >60 mL/min Final  . Anion gap 07/18/2018 6  5 - 15 Final   Performed at [facility], [place] [address], [place], [place] [address]  . Prothrombin Time 07/18/2018 12.6  11.4 - 15.2 seconds Final  . INR 07/18/2018 0.95   Final   Performed at [place] [address], [place], [place] [address]  . ABO/RH(D) 07/18/2018 O POS   Final  . Antibody Screen 07/18/2018 NEG   Final  . Sample Expiration 07/18/2018 07/27/2018   Final  . Extend sample reason 07/18/2018    Final                   Value:NO TRANSFUSIONS OR PREGNANCY IN THE PAST 3 MONTHS Performed at [facility], [place] [address], [place], [place] [address]   . MRSA, PCR 07/18/2018 NEGATIVE  NEGATIVE Final  . Staphylococcus aureus 07/18/2018 NEGATIVE  NEGATIVE Final   Comment: (NOTE) The Xpert SA Assay (FDA approved for NASAL specimens in patients 49 years of age and older), is one component of a comprehensive surveillance program. It is not intended to diagnose infection nor to guide or monitor treatment. Performed at [facility], [place] [address] [place], [place] [address]   . ABO/RH(D) 07/18/2018    Final                   Value:O POS Performed at [facility], [place] [address] [place], [place] [address]      X-Rays:No results found.  EKG: Orders placed or performed in visit on 05/16/18  . EKG 12-Lead     Hospital Course: CAESON FILIPPI is a 72 y.o. who was admitted to [facility]. They were brought to the operating room on 07/24/2018 and underwent Procedure(s): RIGHT TOTAL KNEE ARTHROPLASTY.  Patient tolerated the procedure well and was later transferred to the recovery room and then to the orthopaedic floor for postoperative care. They were given PO and IV analgesics for pain control following their surgery. They were given 24 hours of postoperative antibiotics of  Anti-infectives (From admission, onward)   Start     Dose/Rate Route Frequency Ordered Stop   07/24/18 1530  ceFAZolin (ANCEF) IVPB 2g/100 mL premix  2 g 200 mL/hr over 30 Minutes Intravenous Every 6 hours 07/24/18 1259 07/24/18 2116   07/24/18 0715  ceFAZolin (ANCEF) IVPB 2g/100 mL premix     2 g 200 mL/hr over 30 Minutes Intravenous On call to O.R. 07/24/18 [id number] 07/24/18 0945     and started on DVT prophylaxis in the form of Xarelto.   PT and OT were ordered for total joint protocol. Discharge planning consulted to help with postop disposition and equipment needs. Patient had a decent night on the evening of surgery. They started to get up OOB with therapy on POD  #0. Hemovac drain was pulled without difficulty on day one. Continued to work with therapy into POD #2. Patient was having difficulty urinating s/p foley catheter removal on day one, however by POD #2 AM this had resolved and patient was voiding without difficulty. Pt was seen during rounds on day two and was ready to go home pending progress with therapy. Dressing was changed and the incision was clean, dry, and intact with no drainage. Pt worked with therapy for three additional sessions and was meeting their goals. He was discharged to home later that day in stable condition.  Diet: Cardiac diet Activity: WBAT Follow-up: in 2 weeks with Dr. Wynelle Link Disposition: Home with outpatient physical therapy at [facility]) Discharged Condition: stable   Discharge Instructions    Call MD / Call 911   Complete by:  As directed    If you experience chest pain or shortness of breath, CALL 911 and be transported to the hospital emergency room.  If you develope a fever above 101 F, pus (white drainage) or increased drainage or redness at the wound, or calf pain, call your surgeon's office.   Change dressing   Complete by:  As directed    Change the dressing daily with sterile 4 x 4 inch gauze dressing and apply TED hose.   Constipation Prevention   Complete by:  As directed    Drink plenty of fluids.  Prune juice may be helpful.  You may use a stool softener, such as Colace (over the counter) 100 mg twice a day.  Use MiraLax (over the counter) for constipation as needed.   Diet - low sodium heart healthy   Complete by:  As directed    Discharge instructions   Complete by:  As directed    Dr. Gaynelle Arabian Total Joint Specialist [facility] [address] [place], [place] [address] [phone]  TOTAL KNEE REPLACEMENT POSTOPERATIVE DIRECTIONS  Knee Rehabilitation, Guidelines Following Surgery  Results after knee surgery are often greatly improved when you follow the exercise,  range of motion and muscle strengthening exercises prescribed by your doctor. Safety measures are also important to protect the knee from further injury. Any time any of these exercises cause you to have increased pain or swelling in your knee joint, decrease the amount until you are comfortable again and slowly increase them. If you have problems or questions, call your caregiver or physical therapist for advice.   HOME CARE INSTRUCTIONS  Remove items at home which could result in a fall. This includes throw rugs or furniture in walking pathways.  ICE to the affected knee every three hours for 30 minutes at a time and then as needed for pain and swelling.  Continue to use ice on the knee for pain and swelling from surgery. You may notice swelling that will progress down to the foot and ankle.  This is normal  after surgery.  Elevate the leg when you are not up walking on it.   Continue to use the breathing machine which will help keep your temperature down.  It is common for your temperature to cycle up and down following surgery, especially at night when you are not up moving around and exerting yourself.  The breathing machine keeps your lungs expanded and your temperature down. Do not place pillow under knee, focus on keeping the knee straight while resting   DIET You may resume your previous home diet once your are discharged from the hospital.  DRESSING / WOUND CARE / SHOWERING You may shower 3 days after surgery, but keep the wounds dry during showering.  You may use an occlusive plastic wrap (Press'n Seal for example), NO SOAKING/SUBMERGING IN THE BATHTUB.  If the bandage gets wet, change with a clean dry gauze.  If the incision gets wet, pat the wound dry with a clean towel. You may start showering once you are discharged home but do not submerge the incision under water. Just pat the incision dry and apply a dry gauze dressing on daily. Change the surgical dressing daily and reapply a dry  dressing each time.  ACTIVITY Walk with your walker as instructed. Use walker as long as suggested by your caregivers. Avoid periods of inactivity such as sitting longer than an hour when not asleep. This helps prevent blood clots.  You may resume a sexual relationship in one month or when given the OK by your doctor.  You may return to work once you are cleared by your doctor.  Do not drive a car for 6 weeks or until released by you surgeon.  Do not drive while taking narcotics.  WEIGHT BEARING Weight bearing as tolerated with assist device (walker, cane, etc) as directed, use it as long as suggested by your surgeon or therapist, typically at least 4-6 weeks.  POSTOPERATIVE CONSTIPATION PROTOCOL Constipation - defined medically as fewer than three stools per week and severe constipation as less than one stool per week.  One of the most common issues patients have following surgery is constipation.  Even if you have a regular bowel pattern at home, your normal regimen is likely to be disrupted due to multiple reasons following surgery.  Combination of anesthesia, postoperative narcotics, change in appetite and fluid intake all can affect your bowels.  In order to avoid complications following surgery, here are some recommendations in order to help you during your recovery period.  Colace (docusate) - Pick up an over-the-counter form of Colace or another stool softener and take twice a day as long as you are requiring postoperative pain medications.  Take with a full glass of water daily.  If you experience loose stools or diarrhea, hold the colace until you stool forms back up.  If your symptoms do not get better within 1 week or if they get worse, check with your doctor.  Dulcolax (bisacodyl) - Pick up over-the-counter and take as directed by the product packaging as needed to assist with the movement of your bowels.  Take with a full glass of water.  Use this product as needed if not relieved  by Colace only.   MiraLax (polyethylene glycol) - Pick up over-the-counter to have on hand.  MiraLax is a solution that will increase the amount of water in your bowels to assist with bowel movements.  Take as directed and can mix with a glass of water, juice, soda, coffee, or tea.  Take  if you go more than two days without a movement. Do not use MiraLax more than once per day. Call your doctor if you are still constipated or irregular after using this medication for 7 days in a row.  If you continue to have problems with postoperative constipation, please contact the office for further assistance and recommendations.  If you experience "the worst abdominal pain ever" or develop nausea or vomiting, please contact the office immediatly for further recommendations for treatment.  ITCHING  If you experience itching with your medications, try taking only a single pain pill, or even half a pain pill at a time.  You can also use Benadryl over the counter for itching or also to help with sleep.   TED HOSE STOCKINGS Wear the elastic stockings on both legs for three weeks following surgery during the day but you may remove then at night for sleeping.  MEDICATIONS See your medication summary on the "After Visit Summary" that the nursing staff will review with you prior to discharge.  You may have some home medications which will be placed on hold until you complete the course of blood thinner medication.  It is important for you to complete the blood thinner medication as prescribed by your surgeon.  Continue your approved medications as instructed at time of discharge.  PRECAUTIONS If you experience chest pain or shortness of breath - call 911 immediately for transfer to the hospital emergency department.  If you develop a fever greater that 101 F, purulent drainage from wound, increased redness or drainage from wound, foul odor from the wound/dressing, or calf pain - CONTACT YOUR SURGEON.                                                    FOLLOW-UP APPOINTMENTS Make sure you keep all of your appointments after your operation with your surgeon and caregivers. You should call the office at the above phone number and make an appointment for approximately two weeks after the date of your surgery or on the date instructed by your surgeon outlined in the "After Visit Summary".   RANGE OF MOTION AND STRENGTHENING EXERCISES  Rehabilitation of the knee is important following a knee injury or an operation. After just a few days of immobilization, the muscles of the thigh which control the knee become weakened and shrink (atrophy). Knee exercises are designed to build up the tone and strength of the thigh muscles and to improve knee motion. Often times heat used for twenty to thirty minutes before working out will loosen up your tissues and help with improving the range of motion but do not use heat for the first two weeks following surgery. These exercises can be done on a training (exercise) mat, on the floor, on a table or on a bed. Use what ever works the best and is most comfortable for you Knee exercises include:  Leg Lifts - While your knee is still immobilized in a splint or cast, you can do straight leg raises. Lift the leg to 60 degrees, hold for 3 sec, and slowly lower the leg. Repeat 10-20 times 2-3 times daily. Perform this exercise against resistance later as your knee gets better.  Quad and Hamstring Sets - Tighten up the muscle on the front of the thigh (Quad) and hold for 5-10 sec. Repeat this 10-20  times hourly. Hamstring sets are done by pushing the foot backward against an object and holding for 5-10 sec. Repeat as with quad sets.  Leg Slides: Lying on your back, slowly slide your foot toward your buttocks, bending your knee up off the floor (only go as far as is comfortable). Then slowly slide your foot back down until your leg is flat on the floor again. Angel Wings: Lying on your back spread your  legs to the side as far apart as you can without causing discomfort.  A rehabilitation program following serious knee injuries can speed recovery and prevent re-injury in the future due to weakened muscles. Contact your doctor or a physical therapist for more information on knee rehabilitation.   IF YOU ARE TRANSFERRED TO A SKILLED REHAB FACILITY If the patient is transferred to a skilled rehab facility following release from the hospital, a list of the current medications will be sent to the facility for the patient to continue.  When discharged from the skilled rehab facility, please have the facility set up the patient's [place] prior to being released. Also, the skilled facility will be responsible for providing the patient with their medications at time of release from the facility to include their pain medication, the muscle relaxants, and their blood thinner medication. If the patient is still at the rehab facility at time of the two week follow up appointment, the skilled rehab facility will also need to assist the patient in arranging follow up appointment in our office and any transportation needs.  MAKE SURE YOU:  Understand these instructions.  Get help right away if you are not doing well or get worse.    Pick up stool softner and laxative for home use following surgery while on pain medications. Do not submerge incision under water. Please use good hand washing techniques while changing dressing each day. May shower starting three days after surgery. Please use a clean towel to pat the incision dry following showers. Continue to use ice for pain and swelling after surgery. Do not use any lotions or creams on the incision until instructed by your surgeon.   Do not put a pillow under the knee. Place it under the heel.   Complete by:  As directed    Driving restrictions   Complete by:  As directed    No driving for two weeks   TED hose   Complete by:  As  directed    Use stockings (TED hose) for three weeks on both leg(s).  You may remove them at night for sleeping.   Weight bearing as tolerated   Complete by:  As directed      Allergies as of 07/26/2018   No Known Allergies     Medication List    STOP taking these medications   aspirin 81 MG tablet     TAKE these medications   acetaminophen 500 MG tablet Commonly known as:  TYLENOL Take 1,000 mg by mouth every 6 (six) hours as needed for moderate pain or headache.   amoxicillin 500 MG capsule Commonly known as:  AMOXIL TAKE FOUR CAPSULES BY MOUTH 1 HOUR BEFORE YOUR SURGERY What changed:    how much to take  how to take this  when to take this  additional instructions   BENADRYL 25 mg capsule Generic drug:  diphenhydrAMINE Take 25 mg by mouth daily as needed for allergies.   calcium carbonate 500 MG chewable tablet Commonly known as:  TUMS -  dosed in mg elemental calcium Chew 1 tablet by mouth daily as needed for heartburn.   clobetasol ointment 0.05 % Commonly known as:  TEMOVATE Apply 1 application topically 2 (two) times daily as needed (for psoriasis).   clobetasol 0.05 % external solution Commonly known as:  TEMOVATE Apply 1 application topically 2 (two) times daily as needed (for psoriasis).   desonide 0.05 % ointment Commonly known as:  DESOWEN Apply 1 application topically daily as needed (psoriasis).   ezetimibe 10 MG tablet Commonly known as:  ZETIA Take 1 tablet (10 mg total) by mouth daily.   HUMIRA PEN 40 MG/0.8ML Pnkt Generic drug:  Adalimumab Inject 40 mg into the skin See admin instructions. Inject 40 mg SQ every 5-6 weeks   levETIRAcetam 750 MG tablet Commonly known as:  KEPPRA Take 1 tablet (750 mg total) by mouth 2 (two) times daily.   methocarbamol 500 MG tablet Commonly known as:  ROBAXIN Take 1 tablet (500 mg total) by mouth every 6 (six) hours as needed for muscle spasms.   nitroGLYCERIN 0.4 MG SL tablet Commonly known as:   NITROSTAT DISSOLVE 1 TABLET UNDER THE TONGUE EVERY 5 MINUTES AS NEEDED FOR CHEST PAIN( UP TO 3 DOSES) What changed:  See the new instructions.   oxyCODONE 5 MG immediate release tablet Commonly known as:  Oxy IR/ROXICODONE Take 1-2 tablets (5-10 mg total) by mouth every 6 (six) hours as needed for severe pain (pain score 4-6).   PROCTOZONE-HC 2.5 % rectal cream Generic drug:  hydrocortisone Apply 1 application topically daily as needed for hemorrhoids or itching.   rivaroxaban 10 MG Tabs tablet Commonly known as:  XARELTO Take 1 tablet (10 mg total) by mouth daily with breakfast for 19 doses. Then resume one 81 mg aspirin once a day.   rosuvastatin 40 MG tablet Commonly known as:  CRESTOR Take 1 tablet (40 mg total) by mouth daily.   shark liver oil-cocoa butter 0.25-3-85.5 % suppository Commonly known as:  PREPARATION H Place 1 suppository rectally as needed for hemorrhoids.   traMADol 50 MG tablet Commonly known as:  ULTRAM Take 1-2 tablets (50-100 mg total) by mouth every 6 (six) hours as needed for moderate pain.            Discharge Care Instructions  (From admission, onward)         Start     Ordered   07/25/18 0000  Weight bearing as tolerated     07/25/18 0737   07/25/18 0000  Change dressing    Comments:  Change the dressing daily with sterile 4 x 4 inch gauze dressing and apply TED hose.   07/25/18 0737         Follow-up Information    Gaynelle Arabian, MD. Schedule an appointment as soon as possible for a visit on 08/08/2018.   Specialty:  Orthopedic Surgery Contact information: [address] [place] [address] [phone]           Signed: Theresa Duty, PA-C Orthopedic Surgery 07/31/2018, 11:34 AM

## 2018-07-31 NOTE — Therapy (Signed)
[address] [place], [place], [address] Phone: [phone]   Fax:  [phone]  Physical Therapy Treatment  Patient Details  Name: Douglas Lane MRN: [id number] Date of Birth: [birth year] Referring Provider (PT): Aluisio   Encounter Date: 07/31/2018  PT End of Session - 07/31/18 1144    Visit Number  2    Date for PT Re-Evaluation  09/28/18    PT Start Time  1100    PT Stop Time  1158    PT Time Calculation (min)  58 min    Activity Tolerance  Patient tolerated treatment well    Behavior During Therapy  Impulsive       Past Medical History:  Diagnosis Date  . Aortic atherosclerosis ([place]) 03/08/2017   CT  . Aortic stenosis    severe AS s/p TAVR (03/22/17) with a 54mm Edwards Sapien 3 THV via the TF approach  . Arthritis    "pain in my knees" (03/30/2016)  . Bilateral cataracts   . CAD S/P percutaneous coronary angioplasty 03/31/2016   a. CP/USA -> LHC 03/31/16: 90% prox-mid RCA s/p DES, 40% prox-mid LAD, 25% D2, 25% prox-mid Cx, elevated LVEDP 33.  . Chronic diastolic CHF (congestive heart failure) ([place]) 03/31/2016   pt unaware, has family history  . Chronic lower back pain   . DDD (degenerative disc disease), lumbar   . Dyslipidemia   . Grade II diastolic dysfunction   . Hearing loss   . Heart murmur   . Heartburn    occ. takes TUMS  . Humeral fracture 07/2014   Right  . Kidney cysts    1.6 cm simple cyst right  . LVH (left ventricular hypertrophy)    a. severe basal septal hypertrophy by echo 03/2016.  . Mitral regurgitation    a. mild by echo 03/2016.  . Osteopenia 03/13/2009  . Psoriasis   . Rheumatic fever    as a child  . Scoliosis 03/13/2009  . Seizure ([place]) 01/2011; 07/2011; 07/2015   "idiopathic; been on Kepra since 07/2011", last seizure poss. 07/2015, absent seizure  . Sinus bradycardia - baseline HR 50s at times.   . Sleep apnea    never had sleep study done, pt. unaware  .  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 Thrombosed hemorrhoids     Past Surgical History:  Procedure Laterality Date  . APPENDECTOMY    . BACK SURGERY    . CARDIAC CATHETERIZATION N/A 03/30/2016   Procedure: Right/Left Heart Cath and Coronary Angiography;  Surgeon: Jettie Booze, MD;  Location: [facility];  Service: Cardiovascular;  Laterality: N/A;  . CARDIAC CATHETERIZATION N/A 03/30/2016   Procedure: Coronary Stent Intervention;  Surgeon: Jettie Booze, MD;  Location: [facility];  Service: Cardiovascular;  Laterality: N/A;  . CATARACT EXTRACTION, BILATERAL    . COLONOSCOPY    . CORONARY ANGIOPLASTY    . FOREARM FRACTURE SURGERY Right ~ 2003-2004   "got steel plate in there"  . HERNIA REPAIR  2009  . I&D hemorrhoirds    . KNEE ARTHROSCOPY Bilateral   . MOHS SURGERY Left    temple  . ORIF SCAPULAR FRACTURE Right    right  . POSTERIOR LUMBAR FUSION  2004  . TEE WITHOUT CARDIOVERSION N/A 11/01/2016   Procedure: TRANSESOPHAGEAL ECHOCARDIOGRAM (TEE);  Surgeon: Sanda Klein, MD;  Location: [facility];  Service: Cardiovascular;  Laterality: N/A;  . TEE WITHOUT CARDIOVERSION  N/A 03/22/2017   Procedure: TRANSESOPHAGEAL ECHOCARDIOGRAM (TEE);  Surgeon: Sherren Mocha, MD;  Location: [facility];  Service: Open Heart Surgery;  Laterality: N/A;  . TONSILLECTOMY    . TOTAL KNEE ARTHROPLASTY Right 07/24/2018   Procedure: RIGHT TOTAL KNEE ARTHROPLASTY;  Surgeon: Gaynelle Arabian, MD;  Location: [facility];  Service: Orthopedics;  Laterality: Right;  44min  . TRANSCATHETER AORTIC VALVE REPLACEMENT, TRANSFEMORAL N/A 03/22/2017   Procedure: TRANSCATHETER AORTIC VALVE REPLACEMENT, TRANSFEMORAL;  Surgeon: Sherren Mocha, MD;  Location: [facility];  Service: Open Heart Surgery;  Laterality: N/A;    There were no vitals filed for this visit.  Subjective Assessment - 07/31/18 1100    Subjective  "It is going ok"    Currently in Pain?  Yes    Pain Score  5     Pain Location  Knee     Pain Orientation  Right                       [facility] Adult PT Treatment/Exercise - 07/31/18 0001      Ambulation/Gait   Ambulation/Gait  Yes    Ambulation/Gait Assistance  5: Supervision;4: Min guard    Ambulation Distance (Feet)  60 Feet    Assistive device  Straight cane    Gait Pattern  Decreased hip/knee flexion - right    Ambulation Surface  Level      Knee/Hip Exercises: Aerobic   Nustep  level 3 x 6 minutes      Knee/Hip Exercises: Machines for Strengthening   Cybex Leg Press  no weight X10      Knee/Hip Exercises: Seated   Long Arc Quad  Right;2 sets;10 reps    Other Seated Knee/Hip Exercises  Quad sets with manual resistance 2x10     Other Seated Knee/Hip Exercises  Fitter presses 2 blue 1 black x15     Hamstring Curl  Right;10 reps;2 sets    Hamstring Limitations  red tband       Vasopneumatic   Number Minutes Vasopneumatic   15 minutes    Vasopnuematic Location   Knee    Vasopneumatic Pressure  Medium    Vasopneumatic Temperature   35      Manual Therapy   Manual Therapy  Passive ROM    Passive ROM  R knee Flex and ext                PT Short Term Goals - 07/28/18 1131      PT SHORT TERM GOAL #1   Title  independent with intiial HEP    Time  2    Period  Weeks        PT Long Term Goals - 07/28/18 1131      PT LONG TERM GOAL #1   Title  ambulate community distances without device or with SPC    Time  12    Period  Weeks    Status  New      PT LONG TERM GOAL #2   Title  decrease pain 50%    Period  Weeks    Status  New      PT LONG TERM GOAL #3   Title  increase AROM to 5-115 degrees flexion    Time  12    Period  Weeks    Status  New      PT LONG TERM GOAL #4   Title  increase strength to 4+/5    Time  12  Period  Weeks    Status  New      PT LONG TERM GOAL #5   Title  decrease swelling 2 cm    Time  12    Period  Weeks    Status  New            Plan - 07/31/18 1144    Clinical Impression  Statement  Pt enters clinic ambulating with SPC in the wrong hand putting a lot of body weight on the cane. Person with pt said pt does not want to use walker. Taught pt appropriate gait patter with SPC in LUE. Pt very impulsive at times with exercises and moving around clinic. Const cues needed for pacing and focus on getting a good muscle contraction. Dis well with all the exercises. Some pain at the end range of passive R knee flexion.    Rehab Potential  Good    PT Frequency  3x / week    PT Duration  8 weeks    PT Treatment/Interventions  ADLs/Self Care Home Management;Electrical Stimulation;Gait training;Stair training;Functional mobility training;Therapeutic activities;Therapeutic exercise;Patient/family education;Neuromuscular re-education;Balance training;Manual techniques;Vasopneumatic Device;Passive range of motion    PT Next Visit Plan  slowly progress for ROM, strength, gait and function       Patient will benefit from skilled therapeutic intervention in order to improve the following deficits and impairments:  Abnormal gait, Pain, Decreased scar mobility, Cardiopulmonary status limiting activity, Decreased activity tolerance, Decreased range of motion, Decreased strength, Decreased balance, Difficulty walking, Increased edema, Impaired flexibility  Visit Diagnosis: Acute pain of right knee  Localized edema  Difficulty in walking, not elsewhere classified  Stiffness of right knee, not elsewhere classified     Problem List Patient Active Problem List   Diagnosis Date Noted  . OA (osteoarthritis) of knee 07/24/2018  . Chronic diastolic heart failure ([place]) 11/13/2017  . Benign essential HTN 11/13/2017  . S/P TAVR (transcatheter aortic valve replacement) 03/22/2017  . Severe aortic stenosis 03/22/2017  . Bradycardia, drug induced 10/28/2016  . Dyslipidemia 03/31/2016  . Mitral regurgitation 03/31/2016  . CAD S/P percutaneous coronary angioplasty 03/31/2016  . Abnormal  nuclear stress test   . Heart murmur 03/06/2015  . Abnormal EKG 03/06/2015  . LVH (left ventricular hypertrophy) 03/06/2015  . Rheumatic fever   . Localization-related idiopathic epilepsy and epileptic syndromes with seizures of localized onset, not intractable, without status epilepticus ([place]) 08/23/2014  . Hemorrhoid, thrombosed. 12/24/2011    Scot Jun, PTA 07/31/2018, 11:50 AM  [address] [place], [place], [address] Phone: [phone]   Fax:  [phone]  Name: Douglas Lane MRN: [id number] Date of Birth: 1946-04-01

## 2018-08-02 ENCOUNTER — Ambulatory Visit: Payer: Medicare Other | Admitting: Physical Therapy

## 2018-08-02 ENCOUNTER — Encounter: Payer: Self-pay | Admitting: Physical Therapy

## 2018-08-02 DIAGNOSIS — M25561 Pain in right knee: Secondary | ICD-10-CM | POA: Diagnosis not present

## 2018-08-02 DIAGNOSIS — R600 Localized edema: Secondary | ICD-10-CM

## 2018-08-02 DIAGNOSIS — R262 Difficulty in walking, not elsewhere classified: Secondary | ICD-10-CM

## 2018-08-02 NOTE — Therapy (Signed)
[facility] [address] [place], [place], [address] Phone: [phone]   Fax:  [phone]  Physical Therapy Treatment  Patient Details  Name: Douglas Lane MRN: [id number] Date of Birth: 04/30/1946 Referring Provider (PT): Aluisio   Encounter Date: 08/02/2018  PT End of Session - 08/02/18 1140    Visit Number  3    Date for PT Re-Evaluation  09/28/18    PT Start Time  1100    PT Stop Time  1155    PT Time Calculation (min)  55 min    Activity Tolerance  Patient tolerated treatment well;Patient limited by pain    Behavior During Therapy  Impulsive       Past Medical History:  Diagnosis Date  . Aortic atherosclerosis ([place]) 03/08/2017   CT  . Aortic stenosis    severe AS s/p TAVR (03/22/17) with a 20mm Edwards Sapien 3 THV via the TF approach  . Arthritis    "pain in my knees" (03/30/2016)  . Bilateral cataracts   . CAD S/P percutaneous coronary angioplasty 03/31/2016   a. CP/USA -> LHC 03/31/16: 90% prox-mid RCA s/p DES, 40% prox-mid LAD, 25% D2, 25% prox-mid Cx, elevated LVEDP 33.  . Chronic diastolic CHF (congestive heart failure) ([place]) 03/31/2016   pt unaware, has family history  . Chronic lower back pain   . DDD (degenerative disc disease), lumbar   . Dyslipidemia   . Grade II diastolic dysfunction   . Hearing loss   . Heart murmur   . Heartburn    occ. takes TUMS  . Humeral fracture 07/2014   Right  . Kidney cysts    1.6 cm simple cyst right  . LVH (left ventricular hypertrophy)    a. severe basal septal hypertrophy by echo 03/2016.  . Mitral regurgitation    a. mild by echo 03/2016.  . Osteopenia 03/13/2009  . Psoriasis   . Rheumatic fever    as a child  . Scoliosis 03/13/2009  . Seizure ([place]) 01/2011; 07/2011; 07/2015   "idiopathic; been on Kepra since 07/2011", last seizure poss. 07/2015, absent seizure  . Sinus bradycardia - baseline HR 50s at times.   . Sleep apnea    never had sleep  study done, pt. unaware  . 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 Thrombosed hemorrhoids     Past Surgical History:  Procedure Laterality Date  . APPENDECTOMY    . BACK SURGERY    . CARDIAC CATHETERIZATION N/A 03/30/2016   Procedure: Right/Left Heart Cath and Coronary Angiography;  Surgeon: Jettie Booze, MD;  Location: [facility];  Service: Cardiovascular;  Laterality: N/A;  . CARDIAC CATHETERIZATION N/A 03/30/2016   Procedure: Coronary Stent Intervention;  Surgeon: Jettie Booze, MD;  Location: [facility];  Service: Cardiovascular;  Laterality: N/A;  . CATARACT EXTRACTION, BILATERAL    . COLONOSCOPY    . CORONARY ANGIOPLASTY    . FOREARM FRACTURE SURGERY Right ~ 2003-2004   "got steel plate in there"  . HERNIA REPAIR  2009  . I&D hemorrhoirds    . KNEE ARTHROSCOPY Bilateral   . MOHS SURGERY Left    temple  . ORIF SCAPULAR FRACTURE Right    right  . POSTERIOR LUMBAR FUSION  2004  . TEE WITHOUT CARDIOVERSION N/A 11/01/2016   Procedure: TRANSESOPHAGEAL ECHOCARDIOGRAM (TEE);  Surgeon: Sanda Klein, MD;  Location: [facility];  Service: Cardiovascular;  Laterality: N/A;  .  TEE WITHOUT CARDIOVERSION N/A 03/22/2017   Procedure: TRANSESOPHAGEAL ECHOCARDIOGRAM (TEE);  Surgeon: Sherren Mocha, MD;  Location: [facility];  Service: Open Heart Surgery;  Laterality: N/A;  . TONSILLECTOMY    . TOTAL KNEE ARTHROPLASTY Right 07/24/2018   Procedure: RIGHT TOTAL KNEE ARTHROPLASTY;  Surgeon: Gaynelle Arabian, MD;  [facility];  Service: Orthopedics;  Laterality: Right;  28min  . TRANSCATHETER AORTIC VALVE REPLACEMENT, TRANSFEMORAL N/A 03/22/2017   Procedure: TRANSCATHETER AORTIC VALVE REPLACEMENT, TRANSFEMORAL;  Surgeon: Sherren Mocha, MD;  Location: [facility];  Service: Open Heart Surgery;  Laterality: N/A;    There were no vitals filed for this visit.  Subjective Assessment - 08/02/18 1106    Subjective  "This leg was paining me this morning" Pt stated that he did  not get a middle of a night dose of medicine    Currently in Pain?  Yes    Pain Score  6     Pain Location  Knee    Pain Orientation  Right                       [facility] Adult PT Treatment/Exercise - 08/02/18 0001      Knee/Hip Exercises: Aerobic   Nustep  level 3 x 6 minutes      Knee/Hip Exercises: Standing   Hip Flexion  Right;1 set;10 reps;Knee bent   with RW      Knee/Hip Exercises: Seated   Long Arc Quad  Right;10 reps;3 sets    [employer] Weight  2 lbs.    Other Seated Knee/Hip Exercises  Quad sets with manual resistance 2x10     Other Seated Knee/Hip Exercises  Fitter presses 2 blue 1 black x15     Hamstring Curl  Right;10 reps;2 sets    Hamstring Limitations  red tband       Knee/Hip Exercises: Supine   Short Arc Quad Sets  15 reps;Right;10 reps;2 sets    Straight Leg Raises  Right;10 reps;2 sets      Vasopneumatic   Number Minutes Vasopneumatic   15 minutes    Vasopnuematic Location   Knee    Vasopneumatic Pressure  Medium    Vasopneumatic Temperature   35      Manual Therapy   Manual Therapy  Passive ROM    Passive ROM  R knee Flex and ext                PT Short Term Goals - 08/02/18 1144      PT SHORT TERM GOAL #1   Title  independent with intiial HEP    Status  Achieved        PT Long Term Goals - 08/02/18 1144      PT LONG TERM GOAL #1   Title  ambulate community distances without device or with SPC    Status  On-going      PT LONG TERM GOAL #2   Title  decrease pain 50%    Status  On-going      PT LONG TERM GOAL #4   Title  increase strength to 4+/5    Status  On-going            Plan - 08/02/18 1140    Clinical Impression Statement  Pt enters clinic reporting increase knee pain ambulating with RW. PROM remains well at for this pain in recovery. Spongy end feel with R knee flexion, pain is the only limiting factor. He was able  to complete all of the exercises. Standing R hip flexion ~ to be the most  taxing. Pt groans in pain throughout session. Modality post treatment to help with pain and to control inflammation.     Rehab Potential  Good    PT Frequency  3x / week    PT Duration  8 weeks    PT Treatment/Interventions  ADLs/Self Care Home Management;Electrical Stimulation;Gait training;Stair training;Functional mobility training;Therapeutic activities;Therapeutic exercise;Patient/family education;Neuromuscular re-education;Balance training;Manual techniques;Vasopneumatic Device;Passive range of motion    PT Next Visit Plan  slowly progress for ROM, strength, gait and function       Patient will benefit from skilled therapeutic intervention in order to improve the following deficits and impairments:  Abnormal gait, Pain, Decreased scar mobility, Cardiopulmonary status limiting activity, Decreased activity tolerance, Decreased range of motion, Decreased strength, Decreased balance, Difficulty walking, Increased edema, Impaired flexibility  Visit Diagnosis: Acute pain of right knee  Localized edema  Difficulty in walking, not elsewhere classified     Problem List Patient Active Problem List   Diagnosis Date Noted  . OA (osteoarthritis) of knee 07/24/2018  . Chronic diastolic heart failure ([place]) 11/13/2017  . Benign essential HTN 11/13/2017  . S/P TAVR (transcatheter aortic valve replacement) 03/22/2017  . Severe aortic stenosis 03/22/2017  . Bradycardia, drug induced 10/28/2016  . Dyslipidemia 03/31/2016  . Mitral regurgitation 03/31/2016  . CAD S/P percutaneous coronary angioplasty 03/31/2016  . Abnormal nuclear stress test   . Heart murmur 03/06/2015  . Abnormal EKG 03/06/2015  . LVH (left ventricular hypertrophy) 03/06/2015  . Rheumatic fever   . Localization-related idiopathic epilepsy and epileptic syndromes with seizures of localized onset, not intractable, without status epilepticus ([place]) 08/23/2014  . Hemorrhoid, thrombosed. 12/24/2011    Scot Jun,  PTA 08/02/2018, 11:44 AM  [address] [place], [place], [address] Phone: [phone]   Fax:  [phone]  Name: ALUCARD FEARNOW MRN: [id number] Date of Birth: Jul 26, 1946

## 2018-08-04 ENCOUNTER — Ambulatory Visit: Payer: Medicare Other | Admitting: Physical Therapy

## 2018-08-04 ENCOUNTER — Encounter: Payer: Self-pay | Admitting: Physical Therapy

## 2018-08-04 DIAGNOSIS — M25661 Stiffness of right knee, not elsewhere classified: Secondary | ICD-10-CM

## 2018-08-04 DIAGNOSIS — R2689 Other abnormalities of gait and mobility: Secondary | ICD-10-CM

## 2018-08-04 DIAGNOSIS — M25561 Pain in right knee: Secondary | ICD-10-CM | POA: Diagnosis not present

## 2018-08-04 DIAGNOSIS — R600 Localized edema: Secondary | ICD-10-CM

## 2018-08-04 DIAGNOSIS — R262 Difficulty in walking, not elsewhere classified: Secondary | ICD-10-CM

## 2018-08-04 NOTE — Therapy (Signed)
[address] [place], [place], [address] Phone: [phone]   Fax:  [phone]  Physical Therapy Treatment  Patient Details  Name: Douglas Lane MRN: [id number] Date of Birth: 1946/02/28 Referring Provider (PT): Aluisio   Encounter Date: 08/04/2018  PT End of Session - 08/04/18 1150    Visit Number  4    Date for PT Re-Evaluation  09/28/18    PT Start Time  1100    PT Stop Time  1200    PT Time Calculation (min)  60 min    Activity Tolerance  Patient tolerated treatment well;Patient limited by pain    Behavior During Therapy  Impulsive       Past Medical History:  Diagnosis Date  . Aortic atherosclerosis ([place]) 03/08/2017   CT  . Aortic stenosis    severe AS s/p TAVR (03/22/17) with a 66mm Edwards Sapien 3 THV via the TF approach  . Arthritis    "pain in my knees" (03/30/2016)  . Bilateral cataracts   . CAD S/P percutaneous coronary angioplasty 03/31/2016   a. CP/USA -> LHC 03/31/16: 90% prox-mid RCA s/p DES, 40% prox-mid LAD, 25% D2, 25% prox-mid Cx, elevated LVEDP 33.  . Chronic diastolic CHF (congestive heart failure) ([place]) 03/31/2016   pt unaware, has family history  . Chronic lower back pain   . DDD (degenerative disc disease), lumbar   . Dyslipidemia   . Grade II diastolic dysfunction   . Hearing loss   . Heart murmur   . Heartburn    occ. takes TUMS  . Humeral fracture 07/2014   Right  . Kidney cysts    1.6 cm simple cyst right  . LVH (left ventricular hypertrophy)    a. severe basal septal hypertrophy by echo 03/2016.  . Mitral regurgitation    a. mild by echo 03/2016.  . Osteopenia 03/13/2009  . Psoriasis   . Rheumatic fever    as a child  . Scoliosis 03/13/2009  . Seizure ([place]) 01/2011; 07/2011; 07/2015   "idiopathic; been on Kepra since 07/2011", last seizure poss. 07/2015, absent seizure  . Sinus bradycardia - baseline HR 50s at times.   . Sleep apnea    never had sleep  study done, pt. unaware  . 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 Thrombosed hemorrhoids     Past Surgical History:  Procedure Laterality Date  . APPENDECTOMY    . BACK SURGERY    . CARDIAC CATHETERIZATION N/A 03/30/2016   Procedure: Right/Left Heart Cath and Coronary Angiography;  Surgeon: Jettie Booze, MD;  Location: [facility];  Service: Cardiovascular;  Laterality: N/A;  . CARDIAC CATHETERIZATION N/A 03/30/2016   Procedure: Coronary Stent Intervention;  Surgeon: Jettie Booze, MD;  Location: [facility];  Service: Cardiovascular;  Laterality: N/A;  . CATARACT EXTRACTION, BILATERAL    . COLONOSCOPY    . CORONARY ANGIOPLASTY    . FOREARM FRACTURE SURGERY Right ~ 2003-2004   "got steel plate in there"  . HERNIA REPAIR  2009  . I&D hemorrhoirds    . KNEE ARTHROSCOPY Bilateral   . MOHS SURGERY Left    temple  . ORIF SCAPULAR FRACTURE Right    right  . POSTERIOR LUMBAR FUSION  2004  . TEE WITHOUT CARDIOVERSION N/A 11/01/2016   Procedure: TRANSESOPHAGEAL ECHOCARDIOGRAM (TEE);  Surgeon: Sanda Klein, MD;  Location: [facility];  Service: Cardiovascular;  Laterality: N/A;  .  TEE WITHOUT CARDIOVERSION N/A 03/22/2017   Procedure: TRANSESOPHAGEAL ECHOCARDIOGRAM (TEE);  Surgeon: Sherren Mocha, MD;  Location: [facility];  Service: Open Heart Surgery;  Laterality: N/A;  . TONSILLECTOMY    . TOTAL KNEE ARTHROPLASTY Right 07/24/2018   Procedure: RIGHT TOTAL KNEE ARTHROPLASTY;  Surgeon: Gaynelle Arabian, MD;  [facility];  Service: Orthopedics;  Laterality: Right;  42min  . TRANSCATHETER AORTIC VALVE REPLACEMENT, TRANSFEMORAL N/A 03/22/2017   Procedure: TRANSCATHETER AORTIC VALVE REPLACEMENT, TRANSFEMORAL;  Surgeon: Sherren Mocha, MD;  Location: [facility];  Service: Open Heart Surgery;  Laterality: N/A;    There were no vitals filed for this visit.  Subjective Assessment - 08/04/18 1105    Subjective  "It is looser than last time"    Currently in Pain?  Yes     Pain Score  4     Pain Location  Knee    Pain Orientation  Right         [facility] PT Assessment - 08/04/18 0001      AROM   AROM Assessment Site  Knee    Right/Left Knee  Right    Right Knee Extension  4    Right Knee Flexion  95      PROM   PROM Assessment Site  Knee    Right/Left Knee  Right    Right Knee Flexion  105                   [facility] Adult PT Treatment/Exercise - 08/04/18 0001      High Level Balance   High Level Balance Activities  Side stepping;Backward walking      Knee/Hip Exercises: Aerobic   Recumbent Bike  seat 7 x3 min some full revolutions.     Nustep  level 2 x 5 minutes LE only       Knee/Hip Exercises: Machines for Strengthening   Cybex Knee Extension  5lb 2x10     Cybex Knee Flexion  20lb 2x10     Cybex Leg Press  20lb 2x10, TKE LLE 20lb 2x10       Vasopneumatic   Number Minutes Vasopneumatic   15 minutes    Vasopnuematic Location   Knee    Vasopneumatic Pressure  Medium    Vasopneumatic Temperature   35      Manual Therapy   Manual Therapy  Passive ROM    Passive ROM  R knee Flex and ext                PT Short Term Goals - 08/02/18 1144      PT SHORT TERM GOAL #1   Title  independent with intiial HEP    Status  Achieved        PT Long Term Goals - 08/02/18 1144      PT LONG TERM GOAL #1   Title  ambulate community distances without device or with SPC    Status  On-going      PT LONG TERM GOAL #2   Title  decrease pain 50%    Status  On-going      PT LONG TERM GOAL #4   Title  increase strength to 4+/5    Status  On-going            Plan - 08/04/18 1151    Clinical Impression Statement  Pt continues to need cues for pacing and focus on contraction. Pt reports pain mostly with extension interventions. Bears a lot of weight in UE  when ambulating with RW. Cues to promote heel strike with RLE during gait.  Pt has progressed increasing his R knee AROM.    Rehab Potential  Good    PT Frequency  3x /  week    PT Duration  8 weeks    PT Treatment/Interventions  ADLs/Self Care Home Management;Electrical Stimulation;Gait training;Stair training;Functional mobility training;Therapeutic activities;Therapeutic exercise;Patient/family education;Neuromuscular re-education;Balance training;Manual techniques;Vasopneumatic Device;Passive range of motion    PT Next Visit Plan  slowly progress for ROM, strength, gait and function       Patient will benefit from skilled therapeutic intervention in order to improve the following deficits and impairments:  Abnormal gait, Pain, Decreased scar mobility, Cardiopulmonary status limiting activity, Decreased activity tolerance, Decreased range of motion, Decreased strength, Decreased balance, Difficulty walking, Increased edema, Impaired flexibility  Visit Diagnosis: Acute pain of right knee  Localized edema  Difficulty in walking, not elsewhere classified  Stiffness of right knee, not elsewhere classified  Other abnormalities of gait and mobility     Problem List Patient Active Problem List   Diagnosis Date Noted  . OA (osteoarthritis) of knee 07/24/2018  . Chronic diastolic heart failure ([place]) 11/13/2017  . Benign essential HTN 11/13/2017  . S/P TAVR (transcatheter aortic valve replacement) 03/22/2017  . Severe aortic stenosis 03/22/2017  . Bradycardia, drug induced 10/28/2016  . Dyslipidemia 03/31/2016  . Mitral regurgitation 03/31/2016  . CAD S/P percutaneous coronary angioplasty 03/31/2016  . Abnormal nuclear stress test   . Heart murmur 03/06/2015  . Abnormal EKG 03/06/2015  . LVH (left ventricular hypertrophy) 03/06/2015  . Rheumatic fever   . Localization-related idiopathic epilepsy and epileptic syndromes with seizures of localized onset, not intractable, without status epilepticus ([place]) 08/23/2014  . Hemorrhoid, thrombosed. 12/24/2011    Scot Jun, PTA 08/04/2018, 11:57 AM  [address] [place], [place], [address] Phone: [phone]   Fax:  [phone]  Name: JAWANN URBANI MRN: [id number] Date of Birth: [birth year]

## 2018-08-10 ENCOUNTER — Encounter: Payer: Self-pay | Admitting: Physical Therapy

## 2018-08-10 ENCOUNTER — Ambulatory Visit: Payer: Medicare Other | Admitting: Physical Therapy

## 2018-08-10 DIAGNOSIS — M25561 Pain in right knee: Secondary | ICD-10-CM | POA: Diagnosis not present

## 2018-08-10 DIAGNOSIS — R262 Difficulty in walking, not elsewhere classified: Secondary | ICD-10-CM

## 2018-08-10 DIAGNOSIS — R2689 Other abnormalities of gait and mobility: Secondary | ICD-10-CM

## 2018-08-10 DIAGNOSIS — M25661 Stiffness of right knee, not elsewhere classified: Secondary | ICD-10-CM

## 2018-08-10 DIAGNOSIS — R600 Localized edema: Secondary | ICD-10-CM

## 2018-08-10 NOTE — Therapy (Signed)
[facility] [address] [place], [place], [address] Phone: [phone]   Fax:  [phone]  Physical Therapy Treatment  Patient Details  Name: Douglas Lane MRN: [id number] Date of Birth: 05/06/1946 Referring Provider (PT): Aluisio   Encounter Date: 08/10/2018  PT End of Session - 08/10/18 1229    Visit Number  5    Date for PT Re-Evaluation  09/28/18    PT Start Time  1145    PT Stop Time  1241    PT Time Calculation (min)  56 min    Activity Tolerance  Patient tolerated treatment well    Behavior During Therapy  Impulsive       Past Medical History:  Diagnosis Date  . Aortic atherosclerosis ([facility]) 03/08/2017   CT  . Aortic stenosis    severe AS s/p TAVR (03/22/17) with a 26mm Edwards Sapien 3 THV via the TF approach  . Arthritis    "pain in my knees" (03/30/2016)  . Bilateral cataracts   . CAD S/P percutaneous coronary angioplasty 03/31/2016   a. CP/USA -> LHC 03/31/16: 90% prox-mid RCA s/p DES, 40% prox-mid LAD, 25% D2, 25% prox-mid Cx, elevated LVEDP 33.  . Chronic diastolic CHF (congestive heart failure) ([facility]) 03/31/2016   pt unaware, has family history  . Chronic lower back pain   . DDD (degenerative disc disease), lumbar   . Dyslipidemia   . Grade II diastolic dysfunction   . Hearing loss   . Heart murmur   . Heartburn    occ. takes TUMS  . Humeral fracture 07/2014   Right  . Kidney cysts    1.6 cm simple cyst right  . LVH (left ventricular hypertrophy)    a. severe basal septal hypertrophy by echo 03/2016.  . Mitral regurgitation    a. mild by echo 03/2016.  . Osteopenia 03/13/2009  . Psoriasis   . Rheumatic fever    as a child  . Scoliosis 03/13/2009  . Seizure ([facility]) 01/2011; 07/2011; 07/2015   "idiopathic; been on Kepra since 07/2011", last seizure poss. 07/2015, absent seizure  . Sinus bradycardia - baseline HR 50s at times.   . Sleep apnea    never had sleep study done, pt. unaware  .  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 Thrombosed hemorrhoids     Past Surgical History:  Procedure Laterality Date  . APPENDECTOMY    . BACK SURGERY    . CARDIAC CATHETERIZATION N/A 03/30/2016   Procedure: Right/Left Heart Cath and Coronary Angiography;  Surgeon: Jayadeep S Varanasi, MD;  [facility];  Service: Cardiovascular;  Laterality: N/A;  . CARDIAC CATHETERIZATION N/A 03/30/2016   Procedure: Coronary Stent Intervention;  Surgeon: Jayadeep S Varanasi, MD;  [facility];  Service: Cardiovascular;  Laterality: N/A;  . CATARACT EXTRACTION, BILATERAL    . COLONOSCOPY    . CORONARY ANGIOPLASTY    . FOREARM FRACTURE SURGERY Right ~ 2003-2004   "got steel plate in there"  . HERNIA REPAIR  2009  . I&D hemorrhoirds    . KNEE ARTHROSCOPY Bilateral   . MOHS SURGERY Left    temple  . ORIF SCAPULAR FRACTURE Right    right  . POSTERIOR LUMBAR FUSION  2004  . TEE WITHOUT CARDIOVERSION N/A 11/01/2016   Procedure: TRANSESOPHAGEAL ECHOCARDIOGRAM (TEE);  Surgeon: Mihai Croitoru, MD;  [facility];  Service: Cardiovascular;  Laterality: N/A;  . TEE WITHOUT CARDIOVERSION   N/A 03/22/2017   Procedure: TRANSESOPHAGEAL ECHOCARDIOGRAM (TEE);  Surgeon: Sherren Mocha, MD;  Location: [facility];  Service: Open Heart Surgery;  Laterality: N/A;  . TONSILLECTOMY    . TOTAL KNEE ARTHROPLASTY Right 07/24/2018   Procedure: RIGHT TOTAL KNEE ARTHROPLASTY;  Surgeon: Gaynelle Arabian, MD;  [facility];  Service: Orthopedics;  Laterality: Right;  35mn  . TRANSCATHETER AORTIC VALVE REPLACEMENT, TRANSFEMORAL N/A 03/22/2017   Procedure: TRANSCATHETER AORTIC VALVE REPLACEMENT, TRANSFEMORAL;  Surgeon: CSherren Mocha MD;  [facility]  Service: Open Heart Surgery;  Laterality: N/A;    There were no vitals filed for this visit.  Subjective Assessment - 08/10/18 1146    Subjective  Pt reports that his legs knee feels looser    Currently in Pain?  Yes    Pain Score  4      Pain Location  Knee    Pain Orientation  Right         [facility] PT Assessment - 08/10/18 0001      AROM   AROM Assessment Site  Knee    Right/Left Knee  Right    Right Knee Extension  2    Right Knee Flexion  106                   [facility] Adult PT Treatment/Exercise - 08/10/18 0001      Ambulation/Gait   Ambulation/Gait  Yes    Ambulation/Gait Assistance  5: Supervision    Ambulation Distance (Feet)  120 Feet    Assistive device  Straight cane    Gait Pattern  Decreased hip/knee flexion - right      Knee/Hip Exercises: Aerobic   Recumbent Bike  seat 7 x3 min some full revolutions.     Nustep  level 3 x 5 minutes LE only       Knee/Hip Exercises: Machines for Strengthening   Cybex Leg Press  30lb 2x10, TKE LLE 20lb 2x10       Knee/Hip Exercises: Standing   Forward Step Up  Right;2 sets;10 reps;Hand Hold: 0;Step Height: 6"      Knee/Hip Exercises: Seated   Long Arc Quad  Right;10 reps;2 sets    LIllinois Tool WorksWeight  3 lbs.    Hamstring Curl  Right;2 sets;15 reps    Hamstring Limitations  green    Sit to Sand  2 sets;10 reps;without UE support   Lowered UBE seat      Vasopneumatic   Number Minutes Vasopneumatic   15 minutes    Vasopnuematic Location   Knee    Vasopneumatic Pressure  Medium      Manual Therapy   Manual Therapy  Passive ROM    Passive ROM  R knee Flex and ext                PT Short Term Goals - 08/02/18 1144      PT SHORT TERM GOAL #1   Title  independent with intiial HEP    Status  Achieved        PT Long Term Goals - 08/10/18 1210      PT LONG TERM GOAL #1   Title  ambulate community distances without device or with SPC    Status  On-going      PT LONG TERM GOAL #2   Title  decrease pain 50%    Status  Partially Met      PT LONG TERM GOAL #3   Title  increase AROM to  5-115 degrees flexion    Status  Partially Met      PT LONG TERM GOAL #4   Title  increase strength to 4+/5    Status  On-going      PT  LONG TERM GOAL #5   Title  decrease swelling 2 cm    Status  On-going            Plan - 08/10/18 1229    Clinical Impression Statement  Pt did really well with today's session. He also has shown improved functional mobility/. Ambulated around clinic without AD. He has progressed increasing his R knee AROM. Improved strength on leg press with using RLE only. Little compensation with step ups needed. Cues to increase R hip an d knee flexion with resisted gait.     PT Frequency  3x / week    PT Duration  8 weeks    PT Treatment/Interventions  ADLs/Self Care Home Management;Electrical Stimulation;Gait training;Stair training;Functional mobility training;Therapeutic activities;Therapeutic exercise;Patient/family education;Neuromuscular re-education;Balance training;Manual techniques;Vasopneumatic Device;Passive range of motion    PT Next Visit Plan strength, gait and function       Patient will benefit from skilled therapeutic intervention in order to improve the following deficits and impairments:  Abnormal gait, Pain, Decreased scar mobility, Cardiopulmonary status limiting activity, Decreased activity tolerance, Decreased range of motion, Decreased strength, Decreased balance, Difficulty walking, Increased edema, Impaired flexibility  Visit Diagnosis: Acute pain of right knee  Localized edema  Difficulty in walking, not elsewhere classified  Stiffness of right knee, not elsewhere classified  Other abnormalities of gait and mobility     Problem List Patient Active Problem List   Diagnosis Date Noted  . OA (osteoarthritis) of knee 07/24/2018  . Chronic diastolic heart failure ([facility]) 11/13/2017  . Benign essential HTN 11/13/2017  . S/P TAVR (transcatheter aortic valve replacement) 03/22/2017  . Severe aortic stenosis 03/22/2017  . Bradycardia, drug induced 10/28/2016  . Dyslipidemia 03/31/2016  . Mitral regurgitation 03/31/2016  . CAD S/P percutaneous coronary angioplasty  03/31/2016  . Abnormal nuclear stress test   . Heart murmur 03/06/2015  . Abnormal EKG 03/06/2015  . LVH (left ventricular hypertrophy) 03/06/2015  . Rheumatic fever   . Localization-related idiopathic epilepsy and epileptic syndromes with seizures of localized onset, not intractable, without status epilepticus ([facility]) 08/23/2014  . Hemorrhoid, thrombosed. 12/24/2011     G , PTA 08/10/2018, 12:33 PM  [facility] [address] , [place], [address] Phone: [phone]   Fax:  [phone]  Name: Douglas Lane MRN: [id number] Date of Birth: 12/03/1945   

## 2018-08-14 ENCOUNTER — Ambulatory Visit: Payer: Medicare Other | Admitting: Physical Therapy

## 2018-08-14 ENCOUNTER — Encounter: Payer: Self-pay | Admitting: Physical Therapy

## 2018-08-14 DIAGNOSIS — M25561 Pain in right knee: Secondary | ICD-10-CM | POA: Diagnosis not present

## 2018-08-14 DIAGNOSIS — M25661 Stiffness of right knee, not elsewhere classified: Secondary | ICD-10-CM

## 2018-08-14 DIAGNOSIS — R600 Localized edema: Secondary | ICD-10-CM

## 2018-08-14 DIAGNOSIS — R262 Difficulty in walking, not elsewhere classified: Secondary | ICD-10-CM

## 2018-08-14 NOTE — Therapy (Signed)
[facility] [place] [address] [place], [place], [address] Phone: [phone]   Fax:  [phone]  Physical Therapy Treatment  Patient Details  Name: Douglas Lane MRN: [id number] Date of Birth: 04/01/1946 Referring Provider (PT): Aluisio   Encounter Date: 08/14/2018  PT End of Session - 08/14/18 1645    Visit Number  6    Date for PT Re-Evaluation  09/28/18    PT Start Time  1600    PT Stop Time  1700    PT Time Calculation (min)  60 min    Activity Tolerance  Patient tolerated treatment well    Behavior During Therapy  [facility] for tasks assessed/performed       Past Medical History:  Diagnosis Date  . Aortic atherosclerosis ([place]) 03/08/2017   CT  . Aortic stenosis    severe AS s/p TAVR (03/22/17) with a 18m Edwards Sapien 3 THV via the TF approach  . Arthritis    "pain in my knees" (03/30/2016)  . Bilateral cataracts   . CAD S/P percutaneous coronary angioplasty 03/31/2016   a. CP/USA -> LHC 03/31/16: 90% prox-mid RCA s/p DES, 40% prox-mid LAD, 25% D2, 25% prox-mid Cx, elevated LVEDP 33.  . Chronic diastolic CH[place] (congestive heart failure) ([place] 03/31/2016   pt unaware, has family history  . Chronic lower back pain   . DDD (degenerative disc disease), lumbar   . Dyslipidemia   . Grade II diastolic dysfunction   . Hearing loss   . Heart murmur   . Heartburn    occ. takes TUMS  . Humeral fracture 07/2014   Right  . Kidney cysts    1.6 cm simple cyst right  . LVH (left ventricular hypertrophy)    a. severe basal septal hypertrophy by echo 03/2016.  . Mitral regurgitation    a. mild by echo 03/2016.  . Osteopenia 03/13/2009  . Psoriasis   . Rheumatic fever    as a child  . Scoliosis 03/13/2009  . [place]ure ([place] 01/2011; 07/2011; 07/2015   "idiopathic; been on Kepra since 07/2011", last seizure poss. 07/2015, absent seizure  . Sinus bradycardia - baseline HR 50s at times.   . Sleep apnea    never had sleep study  done, pt. unaware  . 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 Thrombosed hemorrhoids     Past Surgical History:  Procedure Laterality Date  . APPENDECTOMY    . BACK SURGERY    . CARDIAC CATHETERIZATION N/A 03/30/2016   Procedure: Right/Left Heart Cath and Coronary Angiography;  Surgeon: JJettie Booze MD;  [facility];  Service: Cardiovascular;  Laterality: N/A;  . CARDIAC CATHETERIZATION N/A 03/30/2016   Procedure: Coronary Stent Intervention;  Surgeon: JJettie Booze MD;  [facility];  Service: Cardiovascular;  Laterality: N/A;  . CATARACT EXTRACTION, BILATERAL    . COLONOSCOPY    . CORONARY ANGIOPLASTY    . FOREARM FRACTURE SURGERY Right ~ 2003-2004   "got steel plate in there"  . HERNIA REPAIR  2009  . I&D hemorrhoirds    . KNEE ARTHROSCOPY Bilateral   . MOHS SURGERY Left    temple  . ORIF SCAPULAR FRACTURE Right    right  . POSTERIOR LUMBAR FUSION  2004  . TEE WITHOUT CARDIOVERSION N/A 11/01/2016   Procedure: TRANSESOPHAGEAL ECHOCARDIOGRAM (TEE);  Surgeon: MSanda Klein MD;  [facility]  Service: Cardiovascular;  Laterality: N/A;  .  TEE WITHOUT CARDIOVERSION N/A 03/22/2017   Procedure: TRANSESOPHAGEAL ECHOCARDIOGRAM (TEE);  Surgeon: Sherren Mocha, MD;  Location: [facility];  Service: Open Heart Surgery;  Laterality: N/A;  . TONSILLECTOMY    . TOTAL KNEE ARTHROPLASTY Right 07/24/2018   Procedure: RIGHT TOTAL KNEE ARTHROPLASTY;  Surgeon: Gaynelle Arabian, MD;  [facility];  Service: Orthopedics;  Laterality: Right;  27mn  . TRANSCATHETER AORTIC VALVE REPLACEMENT, TRANSFEMORAL N/A 03/22/2017   Procedure: TRANSCATHETER AORTIC VALVE REPLACEMENT, TRANSFEMORAL;  Surgeon: CSherren Mocha MD;  [facility]  Service: Open Heart Surgery;  Laterality: N/A;    There were no vitals filed for this visit.  Subjective Assessment - 08/14/18 1600    Subjective  Pt stated that's he seen the PA and she said he was doing good.   [facility]   Currently in Pain?  Yes    Pain Score  3     Pain Location  Knee    Pain Orientation  Right                       [facility] Adult PT Treatment/Exercise - 08/14/18 0001      Knee/Hip Exercises: Aerobic   Recumbent Bike  seat 7 x3 min some full revolutions.     Nustep  level 3 x 5 minutes LE only       Knee/Hip Exercises: Machines for Strengthening   Cybex Knee Extension  5lb 2x10     Cybex Knee Flexion  25lb 2x10     Cybex Leg Press  40lb 2x10, TKE LLE 20lb 2x10    40lb heel raises 2x15      Knee/Hip Exercises: Standing   Forward Step Up  Right;10 reps;Hand Hold: 0;Step Height: 6";1 set    Step Down  Right;2 sets;10 reps;Hand Hold: 0;Step Height: 4"    Walking with Sports Cord  30lb 4 way x4       Knee/Hip Exercises: Seated   Sit to Sand  2 sets;10 reps;without UE support   blue chair      Vasopneumatic   Number Minutes Vasopneumatic   15 minutes    Vasopnuematic Location   Knee    Vasopneumatic Pressure  Medium    Vasopneumatic Temperature   35               PT Short Term Goals - 08/02/18 1144      PT SHORT TERM GOAL #1   Title  independent with intiial HEP    Status  Achieved        PT Long Term Goals - 08/14/18 1648      PT LONG TERM GOAL #1   Title  ambulate community distances without device or with SPC    Status  Partially Met      PT LONG TERM GOAL #2   Title  decrease pain 50%    Status  Partially Met      PT LONG TERM GOAL #4   Title  increase strength to 4+/5    Status  On-going            Plan - 08/14/18 1646    Clinical Impression Statement  Pt continues to progress well with function. Sit to stand and step ups with little to no compensation. Cues to push R knee dow on leg press. Some instability with resisted side step when the resistance was on his L side. Pt reports that's he is using SPC out in public.  Rehab Potential  Good    PT Duration  8 weeks    PT Next Visit Plan  slowly  progress for ROM, strength, gait and function       Patient will benefit from skilled therapeutic intervention in order to improve the following deficits and impairments:  Abnormal gait, Pain, Decreased scar mobility, Cardiopulmonary status limiting activity, Decreased activity tolerance, Decreased range of motion, Decreased strength, Decreased balance, Difficulty walking, Increased edema, Impaired flexibility  Visit Diagnosis: Localized edema  Difficulty in walking, not elsewhere classified  Acute pain of right knee  Stiffness of right knee, not elsewhere classified     Problem List Patient Active Problem List   Diagnosis Date Noted  . OA (osteoarthritis) of knee 07/24/2018  . Chronic diastolic heart failure ([place]) 11/13/2017  . Benign essential HTN 11/13/2017  . S/P TAVR (transcatheter aortic valve replacement) 03/22/2017  . Severe aortic stenosis 03/22/2017  . Bradycardia, drug induced 10/28/2016  . Dyslipidemia 03/31/2016  . Mitral regurgitation 03/31/2016  . CAD S/P percutaneous coronary angioplasty 03/31/2016  . Abnormal nuclear stress test   . Heart murmur 03/06/2015  . Abnormal EKG 03/06/2015  . LVH (left ventricular hypertrophy) 03/06/2015  . Rheumatic fever   . Localization-related idiopathic epilepsy and epileptic syndromes with seizures of localized onset, not intractable, without status epilepticus ([place]) 08/23/2014  . Hemorrhoid, thrombosed. 12/24/2011    Scot Jun, PTA 08/14/2018, 4:49 PM  [place] [address] [place], [place], [address] Phone: [phone]   Fax:  [phone]  Name: DIEM PAGNOTTA MRN: [id number] Date of Birth: 1946/05/23

## 2018-08-17 ENCOUNTER — Ambulatory Visit: Payer: Medicare Other | Attending: Student | Admitting: Physical Therapy

## 2018-08-17 ENCOUNTER — Encounter: Payer: Self-pay | Admitting: Physical Therapy

## 2018-08-17 DIAGNOSIS — M25561 Pain in right knee: Secondary | ICD-10-CM | POA: Diagnosis present

## 2018-08-17 DIAGNOSIS — M25661 Stiffness of right knee, not elsewhere classified: Secondary | ICD-10-CM

## 2018-08-17 DIAGNOSIS — R600 Localized edema: Secondary | ICD-10-CM | POA: Diagnosis present

## 2018-08-17 DIAGNOSIS — R262 Difficulty in walking, not elsewhere classified: Secondary | ICD-10-CM | POA: Insufficient documentation

## 2018-08-17 NOTE — Therapy (Signed)
[place], [place], [address] Phone: [phone]   Fax:  [phone]  Physical Therapy Treatment  Patient Details  Name: Douglas Lane MRN: [id number] Date of Birth: 09/15/1945 Referring Provider (PT): Aluisio   Encounter Date: 08/17/2018  PT End of Session - 08/17/18 1229    Visit Number  7    Date for PT Re-Evaluation  09/28/18    PT Start Time  1146    PT Stop Time  1244    PT Time Calculation (min)  58 min    Activity Tolerance  Patient tolerated treatment well    Behavior During Therapy  [facility] for tasks assessed/performed       Past Medical History:  Diagnosis Date  . Aortic atherosclerosis ([place]) 03/08/2017   CT  . Aortic stenosis    severe AS s/p TAVR (03/22/17) with a 43m Edwards Sapien 3 THV via the TF approach  . Arthritis    "pain in my knees" (03/30/2016)  . Bilateral cataracts   . CAD S/P percutaneous coronary angioplasty 03/31/2016   a. CP/USA -> LHC 03/31/16: 90% prox-mid RCA s/p DES, 40% prox-mid LAD, 25% D2, 25% prox-mid Cx, elevated LVEDP 33.  . Chronic diastolic CHF ([place]estive heart failure) [place] 03/31/2016   pt unaware, has family history  . Chronic lower back pain   . DDD (degenerative disc disease), lumbar   . Dyslipidemia   . Grade II diastolic dysfunction   . Hearing loss   . Heart murmur   . Heartburn    occ. takes TUMS  . Humeral fracture 07/2014   Right  . Kidney cysts    1.6 cm simple cyst right  . LVH (left ventricular hypertrophy)    a. severe basal septal hypertrophy by echo 03/2016.  . Mitral regurgitation    a. mild by echo 03/2016.  . Osteopenia 03/13/2009  . Psoriasis   . Rheumatic fever    as a child  . Scoliosis 03/13/2009  . [place]; 07/2011; 07/2015   "idiopathic; been on Kepra since 07/2011", last seizure poss. 07/2015, absent seizure  . Sinus bradycardia - baseline HR 50s at times.   . Sleep apnea    never had sleep study  done, pt. unaware  . 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 Thrombosed hemorrhoids     Past Surgical History:  Procedure Laterality Date  . APPENDECTOMY    . BACK SURGERY    . CARDIAC CATHETERIZATION N/A 03/30/2016   Procedure: Right/Left Heart Cath and Coronary Angiography;  Surgeon: JJettie Booze MD;  [facility];  Service: Cardiovascular;  Laterality: N/A;  . CARDIAC CATHETERIZATION N/A 03/30/2016   Procedure: Coronary Stent Intervention;  Surgeon: JJettie Booze MD;  [facility];  Service: Cardiovascular;  Laterality: N/A;  . CATARACT EXTRACTION, BILATERAL    . COLONOSCOPY    . CORONARY ANGIOPLASTY    . FOREARM FRACTURE SURGERY Right ~ 2003-2004   "got steel plate in there"  . HERNIA REPAIR  2009  . I&D hemorrhoirds    . KNEE ARTHROSCOPY Bilateral   . MOHS SURGERY Left    temple  . ORIF SCAPULAR FRACTURE Right    right  . POSTERIOR LUMBAR FUSION  2004  . TEE WITHOUT CARDIOVERSION N/A 11/01/2016   Procedure: TRANSESOPHAGEAL ECHOCARDIOGRAM (TEE);  Surgeon: MSanda Klein MD;  [facility]: [facility]  Service: Cardiovascular;  Laterality: N/A;  .  TEE WITHOUT CARDIOVERSION N/A 03/22/2017   Procedure: TRANSESOPHAGEAL ECHOCARDIOGRAM (TEE);  Surgeon: Sherren Mocha, MD;  Location: [place];  Service: Open Heart Surgery;  Laterality: N/A;  . TONSILLECTOMY    . TOTAL KNEE ARTHROPLASTY Right 07/24/2018   Procedure: RIGHT TOTAL KNEE ARTHROPLASTY;  Surgeon: Gaynelle Arabian, MD;  [facility];  Service: Orthopedics;  Laterality: Right;  34mn  . TRANSCATHETER AORTIC VALVE REPLACEMENT, TRANSFEMORAL N/A 03/22/2017   Procedure: TRANSCATHETER AORTIC VALVE REPLACEMENT, TRANSFEMORAL;  Surgeon: CSherren Mocha MD;  [facility]  Service: Open Heart Surgery;  Laterality: N/A;    There were no vitals filed for this visit.  Subjective Assessment - 08/17/18 1150    Subjective  "All right, doing ok"    Currently in Pain?  Yes    Pain Score   2     Pain Location  Knee    Pain Orientation  Right                       [facility] Adult PT Treatment/Exercise - 08/17/18 0001      Ambulation/Gait   Stairs  Yes    Stairs Assistance  5: Supervision    Stair Management Technique  Alternating pattern;One rail Right    Number of Stairs  24    Height of Stairs  6    Gait Comments  Eccentric load weakness. Pt reports he feels unsteady      Knee/Hip Exercises: Aerobic   Recumbent Bike  L1 x 6 min       Knee/Hip Exercises: Machines for Strengthening   Cybex Knee Extension  5lb 2x10 RLE only     Cybex Knee Flexion  RLE 15lb 2x10     Cybex Leg Press  40lb 3x10, TKE LLE 20lb 2x10       Knee/Hip Exercises: Standing   Lateral Step Up  Right;1 set;10 reps;Hand Hold: 0;Step Height: 6"    Walking with Sports Cord  40lb 4 way x4       Knee/Hip Exercises: Seated   Sit to Sand  2 sets;10 reps;without UE support   blue chair      Vasopneumatic   Number Minutes Vasopneumatic   15 minutes    Vasopnuematic Location   Knee    Vasopneumatic Pressure  Medium    Vasopneumatic Temperature   35               PT Short Term Goals - 08/02/18 1144      PT SHORT TERM GOAL #1   Title  independent with intiial HEP    Status  Achieved        PT Long Term Goals - 08/14/18 1648      PT LONG TERM GOAL #1   Title  ambulate community distances without device or with SPC    Status  Partially Met      PT LONG TERM GOAL #2   Title  decrease pain 50%    Status  Partially Met      PT LONG TERM GOAL #4   Title  increase strength to 4+/5    Status  On-going            Plan - 08/17/18 1234    Clinical Impression Statement  Pt did well overall with today's interventions. Progressed to stair negotiation, pain and discomfort when controlling descents with RLE. Cues to shorten stride with lateral step ups. Que's also needed to pick up feet with resisted gait. RLE fatigues  quick with isolation curl and extensions.     Rehab  Potential  Good    PT Frequency  3x / week    PT Duration  8 weeks    PT Treatment/Interventions  ADLs/Self Care Home Management;Electrical Stimulation;Gait training;Stair training;Functional mobility training;Therapeutic activities;Therapeutic exercise;Patient/family education;Neuromuscular re-education;Balance training;Manual techniques;Vasopneumatic Device;Passive range of motion    PT Next Visit Plan  slowly progress for ROM, strength, gait and function       Patient will benefit from skilled therapeutic intervention in order to improve the following deficits and impairments:  Abnormal gait, Pain, Decreased scar mobility, Cardiopulmonary status limiting activity, Decreased activity tolerance, Decreased range of motion, Decreased strength, Decreased balance, Difficulty walking, Increased edema, Impaired flexibility  Visit Diagnosis: Difficulty in walking, not elsewhere classified  Localized edema  Acute pain of right knee  Stiffness of right knee, not elsewhere classified     Problem List Patient Active Problem List   Diagnosis Date Noted  . OA (osteoarthritis) of knee 07/24/2018  . Chronic diastolic heart failure ([place]) 11/13/2017  . Benign essential HTN 11/13/2017  . S/P TAVR (transcatheter aortic valve replacement) 03/22/2017  . Severe aortic stenosis 03/22/2017  . Bradycardia, drug induced 10/28/2016  . Dyslipidemia 03/31/2016  . Mitral regurgitation 03/31/2016  . CAD S/P percutaneous coronary angioplasty 03/31/2016  . Abnormal nuclear stress test   . Heart murmur 03/06/2015  . Abnormal EKG 03/06/2015  . LVH (left ventricular hypertrophy) 03/06/2015  . Rheumatic fever   . Localization-related idiopathic epilepsy and epileptic syndromes with seizures of localized onset, not intractable, without status epilepticus ([place]) 08/23/2014  . Hemorrhoid, thrombosed. 12/24/2011    Scot Jun, PTA 08/17/2018, 12:38 PM  [address] [address] [place], [place], [address] Phone: [phone]   Fax:  [phone]  Name: Douglas Lane MRN: [id number] Date of Birth: 1946-04-13

## 2018-08-21 ENCOUNTER — Ambulatory Visit: Payer: Medicare Other | Admitting: Physical Therapy

## 2018-08-21 ENCOUNTER — Encounter: Payer: Self-pay | Admitting: Physical Therapy

## 2018-08-21 DIAGNOSIS — R262 Difficulty in walking, not elsewhere classified: Secondary | ICD-10-CM

## 2018-08-21 DIAGNOSIS — M25561 Pain in right knee: Secondary | ICD-10-CM

## 2018-08-21 DIAGNOSIS — R600 Localized edema: Secondary | ICD-10-CM

## 2018-08-21 NOTE — Therapy (Signed)
[place], [place], [address] Phone: [phone]   Fax:  [phone]  Physical Therapy Treatment  Patient Details  Name: Douglas Lane MRN: [id number] Date of Birth: Dec 08, 1945 Referring Provider (PT): Aluisio   Encounter Date: 08/21/2018  PT End of Session - 08/21/18 1141    Visit Number  8    Date for PT Re-Evaluation  09/28/18    PT Start Time  1100    PT Stop Time  1156    PT Time Calculation (min)  56 min    Activity Tolerance  Patient tolerated treatment well    Behavior During Therapy  [facility] for tasks assessed/performed       Past Medical History:  Diagnosis Date  . Aortic atherosclerosis ([place]) 03/08/2017   CT  . Aortic stenosis    severe AS s/p TAVR (03/22/17) with a 76m Edwards Sapien 3 THV via the TF approach  . Arthritis    "pain in my knees" (03/30/2016)  . Bilateral cataracts   . CAD S/P percutaneous coronary angioplasty 03/31/2016   a. CP/USA -> LHC 03/31/16: 90% prox-mid RCA s/p DES, 40% prox-mid LAD, 25% D2, 25% prox-mid Cx, elevated LVEDP 33.  . Chronic diastolic CHF (congestive heart failure) [place] 03/31/2016   pt unaware, has family history  . Chronic lower back pain   . DDD (degenerative disc disease), lumbar   . Dyslipidemia   . Grade II diastolic dysfunction   . Hearing loss   . Heart murmur   . Heartburn    occ. takes TUMS  . Humeral fracture 07/2014   Right  . Kidney cysts    1.6 cm simple cyst right  . LVH (left ventricular hypertrophy)    a. severe basal septal hypertrophy by echo 03/2016.  . Mitral regurgitation    a. mild by echo 03/2016.  . Osteopenia 03/13/2009  . Psoriasis   . Rheumatic fever    as a child  . Scoliosis 03/13/2009  . [place]; 07/2011; 07/2015   "idiopathic; been on Kepra since 07/2011", last seizure poss. 07/2015, absent seizure  . Sinus bradycardia - baseline HR 50s at times.   . Sleep apnea    never had sleep study  done, pt. unaware  . 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 Thrombosed hemorrhoids     Past Surgical History:  Procedure Laterality Date  . APPENDECTOMY    . BACK SURGERY    . CARDIAC CATHETERIZATION N/A 03/30/2016   Procedure: Right/Left Heart Cath and Coronary Angiography;  Surgeon: JJettie Booze MD;  [facility];  Service: Cardiovascular;  Laterality: N/A;  . CARDIAC CATHETERIZATION N/A 03/30/2016   Procedure: Coronary Stent Intervention;  Surgeon: JJettie Booze MD;  [facility];  Service: Cardiovascular;  Laterality: N/A;  . CATARACT EXTRACTION, BILATERAL    . COLONOSCOPY    . CORONARY ANGIOPLASTY    . FOREARM FRACTURE SURGERY Right ~ 2003-2004   "got steel plate in there"  . HERNIA REPAIR  2009  . I&D hemorrhoirds    . KNEE ARTHROSCOPY Bilateral   . MOHS SURGERY Left    temple  . ORIF SCAPULAR FRACTURE Right    right  . POSTERIOR LUMBAR FUSION  2004  . TEE WITHOUT CARDIOVERSION N/A 11/01/2016   Procedure: TRANSESOPHAGEAL ECHOCARDIOGRAM (TEE);  Surgeon: MSanda Klein MD;  [facility]  Service: Cardiovascular;  Laterality: N/A;  .  TEE WITHOUT CARDIOVERSION N/A 03/22/2017   Procedure: TRANSESOPHAGEAL ECHOCARDIOGRAM (TEE);  Surgeon: Sherren Mocha, MD;  Location: [place];  Service: Open Heart Surgery;  Laterality: N/A;  . TONSILLECTOMY    . TOTAL KNEE ARTHROPLASTY Right 07/24/2018   Procedure: RIGHT TOTAL KNEE ARTHROPLASTY;  Surgeon: Gaynelle Arabian, MD;  [facility];  Service: Orthopedics;  Laterality: Right;  35mn  . TRANSCATHETER AORTIC VALVE REPLACEMENT, TRANSFEMORAL N/A 03/22/2017   Procedure: TRANSCATHETER AORTIC VALVE REPLACEMENT, TRANSFEMORAL;  Surgeon: CSherren Mocha MD;  [facility]  Service: Open Heart Surgery;  Laterality: N/A;    There were no vitals filed for this visit.  Subjective Assessment - 08/21/18 1105    Subjective  "I am dragging today, its Monday"    Currently in Pain?  Yes     Pain Score  2     Pain Location  Knee    Pain Orientation  Right                       [facility] Adult PT Treatment/Exercise - 08/21/18 0001      Ambulation/Gait   Stairs  Yes    Stairs Assistance  5: Supervision    Stair Management Technique  Alternating pattern;One rail Right    Number of Stairs  48    Height of Stairs  6    Gait Comments  Eccentric load weakness. Pt reports he feels unsteady      Knee/Hip Exercises: Aerobic   Recumbent Bike  L1 x 4 min     Nustep  level 4 x 5 minutes LE only       Knee/Hip Exercises: Machines for Strengthening   Cybex Knee Extension  5lb 3x10 RLE only     Cybex Knee Flexion  RLE 15lb 3x10       Knee/Hip Exercises: Standing   Heel Raises  Both;2 sets;15 reps;2 seconds    Lateral Step Up  Right;10 reps;Hand Hold: 0;Step Height: 6";2 sets    Walking with Sports Cord  40lb 4 way x5       Knee/Hip Exercises: Seated   Sit to Sand  2 sets;10 reps;without UE support   mat table      Vasopneumatic   Number Minutes Vasopneumatic   15 minutes    Vasopnuematic Location   Knee    Vasopneumatic Pressure  Medium    Vasopneumatic Temperature   35               PT Short Term Goals - 08/02/18 1144      PT SHORT TERM GOAL #1   Title  independent with intiial HEP    Status  Achieved        PT Long Term Goals - 08/14/18 1648      PT LONG TERM GOAL #1   Title  ambulate community distances without device or with SPC    Status  Partially Met      PT LONG TERM GOAL #2   Title  decrease pain 50%    Status  Partially Met      PT LONG TERM GOAL #4   Title  increase strength to 4+/5    Status  On-going            Plan - 08/21/18 1142    Clinical Impression Statement  Progressed to two flights of stairs with less pain from previous Tx. Able to do sit to stands from lower surface. Instability remains with resisted side steps. Improved  strength with SL curls and extensions.     Rehab Potential  Good    PT Frequency  3x  / week    PT Duration  8 weeks    PT Treatment/Interventions  ADLs/Self Care Home Management;Electrical Stimulation;Gait training;Stair training;Functional mobility training;Therapeutic activities;Therapeutic exercise;Patient/family education;Neuromuscular re-education;Balance training;Manual techniques;Vasopneumatic Device;Passive range of motion    PT Next Visit Plan check goals, strength, gait and function       Patient will benefit from skilled therapeutic intervention in order to improve the following deficits and impairments:  Abnormal gait, Pain, Decreased scar mobility, Cardiopulmonary status limiting activity, Decreased activity tolerance, Decreased range of motion, Decreased strength, Decreased balance, Difficulty walking, Increased edema, Impaired flexibility  Visit Diagnosis: Difficulty in walking, not elsewhere classified  Localized edema  Acute pain of right knee     Problem List Patient Active Problem List   Diagnosis Date Noted  . OA (osteoarthritis) of knee 07/24/2018  . Chronic diastolic heart failure ([place]) 11/13/2017  . Benign essential HTN 11/13/2017  . S/P TAVR (transcatheter aortic valve replacement) 03/22/2017  . Severe aortic stenosis 03/22/2017  . Bradycardia, drug induced 10/28/2016  . Dyslipidemia 03/31/2016  . Mitral regurgitation 03/31/2016  . CAD S/P percutaneous coronary angioplasty 03/31/2016  . Abnormal nuclear stress test   . Heart murmur 03/06/2015  . Abnormal EKG 03/06/2015  . LVH (left ventricular hypertrophy) 03/06/2015  . Rheumatic fever   . Localization-related idiopathic epilepsy and epileptic syndromes with seizures of localized onset, not intractable, without status epilepticus ([place]) 08/23/2014  . Hemorrhoid, thrombosed. 12/24/2011    Scot Jun, PTA 08/21/2018, 11:46 AM  [place] [address] [place], [place], [address] Phone: [phone]   Fax:   [phone]  Name: Douglas Lane MRN: [id number] Date of Birth: 29-Sep-1945

## 2018-08-23 ENCOUNTER — Other Ambulatory Visit: Payer: Self-pay

## 2018-08-23 ENCOUNTER — Encounter: Payer: Self-pay | Admitting: Neurology

## 2018-08-23 ENCOUNTER — Ambulatory Visit: Payer: Medicare Other | Admitting: Neurology

## 2018-08-23 VITALS — BP 120/76 | HR 70 | Ht 66.5 in | Wt 181.0 lb

## 2018-08-23 DIAGNOSIS — F32A Depression, unspecified: Secondary | ICD-10-CM

## 2018-08-23 DIAGNOSIS — G40009 Localization-related (focal) (partial) idiopathic epilepsy and epileptic syndromes with seizures of localized onset, not intractable, without status epilepticus: Secondary | ICD-10-CM

## 2018-08-23 DIAGNOSIS — G4700 Insomnia, unspecified: Secondary | ICD-10-CM | POA: Diagnosis not present

## 2018-08-23 DIAGNOSIS — F329 Major depressive disorder, single episode, unspecified: Secondary | ICD-10-CM

## 2018-08-23 MED ORDER — AMITRIPTYLINE HCL 25 MG PO TABS: ORAL_TABLET | ORAL | 11 refills | Status: DC

## 2018-08-23 MED ORDER — LEVETIRACETAM 750 MG PO TABS: 750.0000 mg | ORAL_TABLET | Freq: Two times a day (BID) | ORAL | 3 refills | Status: DC

## 2018-08-23 NOTE — Progress Notes (Signed)
NEUROLOGY FOLLOW UP OFFICE NOTE  Douglas Lane [id number]  DOB: 1945-08-25  HISTORY OF PRESENT ILLNESS: I had the pleasure of seeing Douglas Lane in follow-up in the neurology clinic on 08/23/2018.  The patient was last seen 7 months ago for recurrent seizures. He had been seizure-free for a year off Keppra, and had tapered of clorazepate which he was taking for anxiety. He had a generalized convulsion on 07/31/14, 3 days after stopping clorazepate. He was restarted on Keppra 500mg  BID. His wife had also reported that after stopping the Keppra, he had prolonged episodes of memory lapses where he could not recall the last few hours. Keppra dose increased to 750mg  BID after an amnestic episode in July 2017. He had another one in May 2019 which may have occurred in the setting of sleep deprivation. Since his last visit, he reports one more episode of memory gap last summer that lasted 20 minutes. His main concern today is poor sleep due to significant knee pain post-surgery. He reports knee is better with regards to mobility, but the pain is unchanged and keeping him awake at night. He tried Trazodone in the past with minimal effect. He denies any headaches, dizziness, diplopia, dysarthria/dysphagia. He slipped on wet grass last summer, no injuries.   History on Initial Assessment 08/20/2014: This is a 73 yo RH man with a history of 2 seizures in 2012 that occurred 6 months apart, presenting after ER visit for a seizure last 08/03/14. The first seizure occurred in June 2012. He recalls it was a stressful period just soon after he retired. He had a nocturnal convulsion and was brought to [facility]. He was not started on medication initially as it was his first seizure. On December 2012, he had another nocturnal convulsion and was started on Keppra 500mg  BID with no further seizures for 3 years. He and his neurologist in [facility] agreed to start weaning off Keppra, and this was completely  discontinued the spring/summer of 2015. Around 2-1/2 years ago, he had also been reporting anxiety to his neurologist, and was started on clorazepate 7.5mg , weaned to 1/2 tab for 6 months, then discontinued 3 days prior to the most recent nocturnal seizure on 12/16 off AEDs. He was brought to [facility] ER, there is no bloodwork or drug screen for review, CT head unremarkable.He was found to have posterior dislocation and comminuted fracture of the right humeral head. He had soft tissue injury extending along the right anterior proximal arm. This was reduced in the ER and he continues to wear a sling. He was restarted on Keppra 500mg  BID with no side effects except for mild drowsiness.   His wife reports that since coming off the [place] last year, he has had 3 incidences of memory lapses, where he could not recall the last few hours. His wife reports that he would look confused but he can function and drive. The last episode occurred 6 weeks ago, he did not recall being in church. His wife reports he was talking fine but had a different look about him. This lasted about 1-2 hours. He had neuropsychological testing for memory loss and was told he has "short-term memory problems." In hindsight, when asked about an MRI done in January 2012, he had an episode where he had transient memory loss while he was at a [place]. I personally reviewed MRI brain without contrast done which showed mild chronic microvascular changes in the bilateral subcortical regions and right central pons.  He denies any episodes of staring/unresponsiveness, no olfactory/gustatory hallucinations, deja vu, rising epigastric sensation, focal numbness/tingling/weakness, myoclonic jerks. He has 1-4 drinks daily (beer or whisky), with no change in pattern for many years. He denies any sleep deprivation prior to the seizures, but reports these being stressful periods. He continues to have anxiety and has "always been a Research officer, trade [employer]."   Epilepsy Risk  Factors: He had a normal birth and early development. There is no history of febrile convulsions, CNS infections such as meningitis/encephalitis, significant traumatic brain injury, neurosurgical procedures, or family history of seizures.   PAST MEDICAL HISTORY: Past Medical History:  Diagnosis Date  . Aortic atherosclerosis ([place]) 03/08/2017   CT  . Aortic stenosis    severe AS s/p TAVR (03/22/17) with a 72mm Edwards Sapien 3 THV via the TF approach  . Arthritis    "pain in my knees" (03/30/2016)  . Bilateral cataracts   . CAD S/P percutaneous coronary angioplasty 03/31/2016   a. CP/USA -> LHC 03/31/16: 90% prox-mid RCA s/p DES, 40% prox-mid LAD, 25% D2, 25% prox-mid Cx, elevated LVEDP 33.  . Chronic diastolic CHF (congestive heart failure) ([place]) 03/31/2016   pt unaware, has family history  . Chronic lower back pain   . DDD (degenerative disc disease), lumbar   . Dyslipidemia   . Grade II diastolic dysfunction   . Hearing loss   . Heart murmur   . Heartburn    occ. takes TUMS  . Humeral fracture 07/2014   Right  . Kidney cysts    1.6 cm simple cyst right  . LVH (left ventricular hypertrophy)    a. severe basal septal hypertrophy by echo 03/2016.  . Mitral regurgitation    a. mild by echo 03/2016.  . Osteopenia 03/13/2009  . Psoriasis   . Rheumatic fever    as a child  . Scoliosis 03/13/2009  . Seizure ([place]) 01/2011; 07/2011; 07/2015   "idiopathic; been on Kepra since 07/2011", last seizure poss. 07/2015, absent seizure  . Sinus bradycardia - baseline HR 50s at times.   . Sleep apnea    never had sleep study done, pt. unaware  . 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 Thrombosed hemorrhoids     MEDICATIONS: Current Outpatient Medications on File Prior to Visit  Medication Sig Dispense Refill  . acetaminophen (TYLENOL) 500 MG tablet Take 1,000 mg by mouth every 6 (six) hours as needed for moderate pain or headache.     Marland Kitchen amoxicillin (AMOXIL) 500 MG capsule TAKE  FOUR CAPSULES BY MOUTH 1 HOUR BEFORE YOUR SURGERY (Patient taking differently: Take 2,000 mg by mouth See admin instructions. Take 2000 mg by mouth 1 hour to procedure) 4 capsule 3  . calcium carbonate (TUMS - DOSED IN MG ELEMENTAL CALCIUM) 500 MG chewable tablet Chew 1 tablet by mouth daily as needed for heartburn.     . clobetasol (TEMOVATE) 0.05 % external solution Apply 1 application topically 2 (two) times daily as needed (for psoriasis).    . clobetasol ointment (TEMOVATE) 4.74 % Apply 1 application topically 2 (two) times daily as needed (for psoriasis).    Marland Kitchen desonide (DESOWEN) 0.05 % ointment Apply 1 application topically daily as needed (psoriasis).     . diphenhydrAMINE (BENADRYL) 25 mg capsule Take 25 mg by mouth daily as needed for allergies.    Marland Kitchen ezetimibe (ZETIA) 10 MG tablet Take 1 tablet (10 mg total) by mouth daily. 30 tablet 11  . HUMIRA PEN 40 MG/0.8ML PNKT  Inject 40 mg into the skin See admin instructions. Inject 40 mg SQ every 5-6 weeks    . levETIRAcetam (KEPPRA) 750 MG tablet Take 1 tablet (750 mg total) by mouth 2 (two) times daily. 180 tablet 3  . methocarbamol (ROBAXIN) 500 MG tablet Take 1 tablet (500 mg total) by mouth every 6 (six) hours as needed for muscle spasms. 40 tablet 0  . nitroGLYCERIN (NITROSTAT) 0.4 MG SL tablet DISSOLVE 1 TABLET UNDER THE TONGUE EVERY 5 MINUTES AS NEEDED FOR CHEST PAIN( UP TO 3 DOSES) (Patient taking differently: Place 0.4 mg under the tongue every 5 (five) minutes as needed for chest pain. ) 25 tablet 5  . oxyCODONE (OXY IR/ROXICODONE) 5 MG immediate release tablet Take 1-2 tablets (5-10 mg total) by mouth every 6 (six) hours as needed for severe pain (pain score 4-6). 56 tablet 0  . PROCTOZONE-HC 2.5 % rectal cream Apply 1 application topically daily as needed for hemorrhoids or itching.     . rivaroxaban (XARELTO) 10 MG TABS tablet Take 1 tablet (10 mg total) by mouth daily with breakfast for 19 doses. Then resume one 81 mg aspirin once a day.  19 tablet 0  . rosuvastatin (CRESTOR) 40 MG tablet Take 1 tablet (40 mg total) by mouth daily. 90 tablet 3  . shark liver oil-cocoa butter (PREPARATION H) 0.25-3-85.5 % suppository Place 1 suppository rectally as needed for hemorrhoids.    . traMADol (ULTRAM) 50 MG tablet Take 1-2 tablets (50-100 mg total) by mouth every 6 (six) hours as needed for moderate pain. 40 tablet 0   No current facility-administered medications on file prior to visit.     ALLERGIES: No Known Allergies  FAMILY HISTORY: Family History  Problem Relation Age of Onset  . Renal Disease Mother   . Congestive Heart Failure Father   . Depression Sister     SOCIAL HISTORY: Social History   Socioeconomic History  . Marital status: Married    Spouse name: Not on file  . Number of children: Not on file  . Years of education: Not on file  . Highest education level: Not on file  Occupational History  . Not on file  Social Needs  . Financial resource strain: Not on file  . Food insecurity:    Worry: Not on file    Inability: Not on file  . Transportation needs:    Medical: Not on file    Non-medical: Not on file  Tobacco Use  . Smoking status: Never Smoker  . Smokeless tobacco: Never Used  Substance and Sexual Activity  . Alcohol use: Yes    Alcohol/week: 14.0 standard drinks    Types: 7 Shots of liquor, 7 Cans of beer per week  . Drug use: No  . Sexual activity: Not Currently  Lifestyle  . Physical activity:    Days per week: Not on file    Minutes per session: Not on file  . Stress: Not on file  Relationships  . Social connections:    Talks on phone: Not on file    Gets together: Not on file    Attends religious service: Not on file    Active member of club or organization: Not on file    Attends meetings of clubs or organizations: Not on file    Relationship status: Not on file  . Intimate partner violence:    Fear of current or ex partner: Not on file    Emotionally abused: Not on file  Physically abused: Not on file    Forced sexual activity: Not on file  Other Topics Concern  . Not on file  Social History Narrative  . Not on file    REVIEW OF SYSTEMS: Constitutional: No fevers, chills, or sweats, no generalized fatigue, change in appetite Eyes: No visual changes, double vision, eye pain Ear, nose and throat: No hearing loss, ear pain, nasal congestion, sore throat Cardiovascular: No chest pain, palpitations Respiratory:  No shortness of breath at rest or with exertion, wheezes GastrointestinaI: No nausea, vomiting, diarrhea, abdominal pain, fecal incontinence Genitourinary:  No dysuria, urinary retention or frequency Musculoskeletal:  +joint pain, left knee pain Integumentary: No rash, pruritus, skin lesions Neurological: as above Psychiatric: + depression,+ insomnia, anxiety Endocrine: No palpitations, fatigue, diaphoresis, mood swings, change in appetite, change in weight, increased thirst Hematologic/Lymphatic:  No anemia, purpura, petechiae. Allergic/Immunologic: no itchy/runny eyes, nasal congestion, recent allergic reactions, rashes  PHYSICAL EXAM: Vitals:   08/23/18 1155  BP: 120/76  Pulse: 70  SpO2: 97%   General: No acute distress, uncomfortable due to right knee pain Head:  Normocephalic/atraumatic Neck: supple, no paraspinal tenderness, full range of motion Heart:  Regular rate and rhythm Lungs:  Clear to auscultation bilaterally Back: No paraspinal tenderness Skin/Extremities: No rash, no edema Neurological Exam: alert and oriented to person, place, and time. No aphasia or dysarthria. Fund of knowledge is appropriate.  Recent and remote memory are intact. Attention and concentration are normal.    Able to name objects and repeat phrases. Cranial nerves: Pupils equal, round, reactive to light.  Extraocular movements intact with no nystagmus. Visual fields full. Facial sensation intact. No facial asymmetry. Tongue, uvula, palate midline.  Motor:  Bulk and tone normal, muscle strength 5/5 throughout with no pronator drift.  Sensation to light touch intact.  No extinction to double simultaneous stimulation. Finger to nose testing intact.  Gait slow and cautious due to pain, no ataxia.  IMPRESSION: This is a 73 yo RH man with a history of 2 convulsive seizures in 2012. As he had been seizure-free for 3 years, Keppra was tapered off last spring/summer 2015. He had been taking clorazepate for anxiety and was tapered off this 3 days prior to the nocturnal seizure in 08/03/14. No further convulsions since 2015, however his wife reported episodes of loss of time while off Keppra, concerning for partial seizures arising from the temporal lobe. He had one in July 2017, May 2019, and most recently last summer. He would like to stay on Keppra 750mg  BID for now, we again discussed that if he has more episodes we will increase dose to 1000mg  BID. He is mostly focused on his pain causing poor sleep and would like help with this. I discussed speaking to his surgeon about continued pain, we will try amitriptyline to help with sleep and depression. Start 25mg  qhs x 3 days, then increase to 50mg  qhs. Side effects discussed, we may uptitrate as tolerated. He is aware of [place] driving laws that indicate one has to stop driving after a seizure, until 6 months seizure-free. He will follow-up in 6 months or earlier if needed.   Thank you for allowing me to participate in his care.  Please do not hesitate to call for any questions or concerns.  The duration of this appointment visit was 30 minutes of face-to-face time with the patient.  Greater than 50% of this time was spent in counseling, explanation of diagnosis, planning of further management, and coordination of care.  Ellouise Newer, M.D.   CC: Dr. Harrington Challenger

## 2018-08-23 NOTE — Patient Instructions (Addendum)
1. Start amitriptyline 25mg : Take 1 tablet every night for 3 days, then increase to 2 tablets every night. We can increase dose further if needed  2. Continue to monitor seizures, if gaps in time continue, would strongly recommend increasing Keppra to 1000mg  twice a day  3. Wishing you well with knee pain, consider speaking to your surgeon about Pain Management referral  4. Follow-up in 6 months, call for any changes  Seizure Precautions: 1. If medication has been prescribed for you to prevent seizures, take it exactly as directed.  Do not stop taking the medicine without talking to your doctor first, even if you have not had a seizure in a long time.   2. Avoid activities in which a seizure would cause danger to yourself or to others.  Don't operate dangerous machinery, swim alone, or climb in high or dangerous places, such as on ladders, roofs, or girders.  Do not drive unless your doctor says you may.  3. If you have any warning that you may have a seizure, lay down in a safe place where you can't hurt yourself.    4.  No driving for 6 months from last seizure, as per [facility].   Please refer to the following link on the [place] website for more information: http://[email or web address]/[email or web address]/drivingu.cfm   5.  Maintain good sleep hygiene. Avoid alcohol.  6.  Contact your doctor if you have any problems that may be related to the medicine you are taking.  7.  Call 911 and bring the patient back to the ED if:        A.  The seizure lasts longer than 5 minutes.       B.  The patient doesn't awaken shortly after the seizure  C.  The patient has new problems such as difficulty seeing, speaking or moving  D.  The patient was injured during the seizure  E.  The patient has a temperature over 102 F (39C)  F.  The patient vomited and now is having trouble breathing

## 2018-08-24 ENCOUNTER — Encounter: Payer: Self-pay | Admitting: Neurology

## 2018-08-25 ENCOUNTER — Ambulatory Visit: Payer: Medicare Other | Admitting: Physical Therapy

## 2018-08-25 ENCOUNTER — Encounter: Payer: Self-pay | Admitting: Physical Therapy

## 2018-08-25 DIAGNOSIS — R262 Difficulty in walking, not elsewhere classified: Secondary | ICD-10-CM | POA: Diagnosis not present

## 2018-08-25 DIAGNOSIS — R600 Localized edema: Secondary | ICD-10-CM

## 2018-08-25 DIAGNOSIS — M25661 Stiffness of right knee, not elsewhere classified: Secondary | ICD-10-CM

## 2018-08-25 DIAGNOSIS — M25561 Pain in right knee: Secondary | ICD-10-CM

## 2018-08-25 NOTE — Therapy (Signed)
[address], [place], [address] Phone: [phone]   Fax:  [phone]  Physical Therapy Treatment  Patient Details  Name: Douglas Lane MRN: [id number] Date of Birth: Apr 13, 1946 Referring Provider (PT): Aluisio   Encounter Date: 08/25/2018  PT End of Session - 08/25/18 1000    Visit Number  9    Date for PT Re-Evaluation  09/28/18    PT Start Time  0930    PT Stop Time  1020    PT Time Calculation (min)  50 min       Past Medical History:  Diagnosis Date  . Aortic atherosclerosis ([place]) 03/08/2017   CT  . Aortic stenosis    severe AS s/p TAVR (03/22/17) with a 20m Edwards Sapien 3 THV via the TF approach  . Arthritis    "pain in my knees" (03/30/2016)  . Bilateral cataracts   . CAD S/P percutaneous coronary angioplasty 03/31/2016   a. CP/USA -> LHC 03/31/16: 90% prox-mid RCA s/p DES, 40% prox-mid LAD, 25% D2, 25% prox-mid Cx, elevated LVEDP 33.  . Chronic diastolic CHF (congestive heart failure) ([place] 03/31/2016   pt unaware, has family history  . Chronic lower back pain   . DDD (degenerative disc disease), lumbar   . Dyslipidemia   . Grade II diastolic dysfunction   . Hearing loss   . Heart murmur   . Heartburn    occ. takes TUMS  . Humeral fracture 07/2014   Right  . Kidney cysts    1.6 cm simple cyst right  . LVH (left ventricular hypertrophy)    a. severe basal septal hypertrophy by echo 03/2016.  . Mitral regurgitation    a. mild by echo 03/2016.  . Osteopenia 03/13/2009  . Psoriasis   . Rheumatic fever    as a child  . Scoliosis 03/13/2009  . Seizure ([place] 01/2011; 07/2011; 07/2015   "idiopathic; been on Kepra since 07/2011", last seizure poss. 07/2015, absent seizure  . Sinus bradycardia - baseline HR 50s at times.   . Sleep apnea    never had sleep study done, pt. unaware  . 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 Thrombosed hemorrhoids     Past Surgical  History:  Procedure Laterality Date  . APPENDECTOMY    . BACK SURGERY    . CARDIAC CATHETERIZATION N/A 03/30/2016   Procedure: Right/Left Heart Cath and Coronary Angiography;  Surgeon: JJettie Booze MD;  [facility];  Service: Cardiovascular;  Laterality: N/A;  . CARDIAC CATHETERIZATION N/A 03/30/2016   Procedure: Coronary Stent Intervention;  Surgeon: JJettie Booze MD;  [facility];  Service: Cardiovascular;  Laterality: N/A;  . CATARACT EXTRACTION, BILATERAL    . COLONOSCOPY    . CORONARY ANGIOPLASTY    . FOREARM FRACTURE SURGERY Right ~ 2003-2004   "got steel plate in there"  . HERNIA REPAIR  2009  . I&D hemorrhoirds    . KNEE ARTHROSCOPY Bilateral   . MOHS SURGERY Left    temple  . ORIF SCAPULAR FRACTURE Right    right  . POSTERIOR LUMBAR FUSION  2004  . TEE WITHOUT CARDIOVERSION N/A 11/01/2016   Procedure: TRANSESOPHAGEAL ECHOCARDIOGRAM (TEE);  Surgeon: MSanda Klein MD;  [facility]  Service: Cardiovascular;  Laterality: N/A;  . TEE WITHOUT CARDIOVERSION N/A 03/22/2017   Procedure: TRANSESOPHAGEAL ECHOCARDIOGRAM (TEE);  Surgeon: CSherren Mocha MD;  [facility]  Service: Open Heart Surgery;  Laterality: N/A;  . TONSILLECTOMY    . TOTAL KNEE ARTHROPLASTY Right 07/24/2018   Procedure: RIGHT TOTAL KNEE ARTHROPLASTY;  Surgeon: Gaynelle Arabian, MD;  Location: [facility];  Service: Orthopedics;  Laterality: Right;  47mn  . TRANSCATHETER AORTIC VALVE REPLACEMENT, TRANSFEMORAL N/A 03/22/2017   Procedure: TRANSCATHETER AORTIC VALVE REPLACEMENT, TRANSFEMORAL;  Surgeon: CSherren Mocha MD;  [facility]  Service: Open Heart Surgery;  Laterality: N/A;    There were no vitals filed for this visit.  Subjective Assessment - 08/25/18 0932    Subjective  pretty good. MD Jan 21st    Currently in Pain?  Yes    Pain Score  2     Pain Location  Knee    Pain Orientation  Right         [facility] PT Assessment - 08/25/18 0001       AROM   AROM Assessment Site  Knee    Right/Left Knee  Right    Right Knee Extension  1    Right Knee Flexion  112      PROM   PROM Assessment Site  --    Right/Left Knee  --      Strength   Overall Strength Comments  4+/5 with some pain                   [facility] Adult PT Treatment/Exercise - 08/25/18 0001      Knee/Hip Exercises: Aerobic   Recumbent Bike  L1 x 5 min     Nustep  level 4 x 5 minutes LE only       Knee/Hip Exercises: Machines for Strengthening   Cybex Knee Extension  5# 2 sets 10, BIL 10# 10 times    Cybex Knee Flexion  RLE 15lb 2x10 , BIL 25# 10    Cybex Leg Press  40# 3 sets 10 with focus on TKE    Total Gym Leg Press  calf raises 40# 2 sets 15      Knee/Hip Exercises: Standing   Lateral Step Up  Right;Hand Hold: 0;Step Height: 6";15 reps;1 set    Forward Step Up  Right;15 reps;Step Height: 6";Hand Hold: 0      Vasopneumatic   Number Minutes Vasopneumatic   15 minutes    Vasopnuematic Location   Knee    Vasopneumatic Pressure  Medium    Vasopneumatic Temperature   35               PT Short Term Goals - 08/02/18 1144      PT SHORT TERM GOAL #1   Title  independent with intiial HEP    Status  Achieved        PT Long Term Goals - 08/25/18 0939      PT LONG TERM GOAL #1   Title  ambulate community distances without device or with SPC    Status  Achieved      PT LONG TERM GOAL #2   Title  decrease pain 50%    Status  Partially Met      PT LONG TERM GOAL #3   Title  increase AROM to 5-115 degrees flexion    Status  Partially Met      PT LONG TERM GOAL #4   Title  increase strength to 4+/5    Status  Achieved      PT LONG TERM GOAL #5   Title  decrease swelling 2 cm    Status  Achieved            Plan - 08/25/18 1000    Clinical Impression Statement  pt progressing with all LTGS. excellent increase in AROM. decreased balance noted with step up without UE support    PT Treatment/Interventions  ADLs/Self Care Home  Management;Electrical Stimulation;Gait training;Stair training;Functional mobility training;Therapeutic activities;Therapeutic exercise;Patient/family education;Neuromuscular re-education;Balance training;Manual techniques;Vasopneumatic Device;Passive range of motion    PT Next Visit Plan  strength, gait and function- high level plus add SLS and balance       Patient will benefit from skilled therapeutic intervention in order to improve the following deficits and impairments:  Abnormal gait, Pain, Decreased scar mobility, Cardiopulmonary status limiting activity, Decreased activity tolerance, Decreased range of motion, Decreased strength, Decreased balance, Difficulty walking, Increased edema, Impaired flexibility  Visit Diagnosis: Difficulty in walking, not elsewhere classified  Localized edema  Acute pain of right knee  Stiffness of right knee, not elsewhere classified     Problem List Patient Active Problem List   Diagnosis Date Noted  . OA (osteoarthritis) of knee 07/24/2018  . Chronic diastolic heart failure ([place]) 11/13/2017  . Benign essential HTN 11/13/2017  . S/P TAVR (transcatheter aortic valve replacement) 03/22/2017  . Severe aortic stenosis 03/22/2017  . Bradycardia, drug induced 10/28/2016  . Dyslipidemia 03/31/2016  . Mitral regurgitation 03/31/2016  . CAD S/P percutaneous coronary angioplasty 03/31/2016  . Abnormal nuclear stress test   . Heart murmur 03/06/2015  . Abnormal EKG 03/06/2015  . LVH (left ventricular hypertrophy) 03/06/2015  . Rheumatic fever   . Localization-related idiopathic epilepsy and epileptic syndromes with seizures of localized onset, not intractable, without status epilepticus ([place]) 08/23/2014  . Hemorrhoid, thrombosed. 12/24/2011    ,ANGIE PTA 08/25/2018, 10:02 AM  [address], [place], [address] Phone: [phone]   Fax:  [phone]  Name:  Douglas Lane MRN: [id number] Date of Birth: 1946/04/17

## 2018-08-28 ENCOUNTER — Ambulatory Visit: Payer: Medicare Other | Admitting: Physical Therapy

## 2018-08-28 ENCOUNTER — Encounter: Payer: Self-pay | Admitting: Physical Therapy

## 2018-08-28 DIAGNOSIS — M25661 Stiffness of right knee, not elsewhere classified: Secondary | ICD-10-CM

## 2018-08-28 DIAGNOSIS — R262 Difficulty in walking, not elsewhere classified: Secondary | ICD-10-CM | POA: Diagnosis not present

## 2018-08-28 DIAGNOSIS — R600 Localized edema: Secondary | ICD-10-CM

## 2018-08-28 DIAGNOSIS — M25561 Pain in right knee: Secondary | ICD-10-CM

## 2018-08-28 NOTE — Therapy (Signed)
[address] [place], [place], [address] Phone: [phone]   Fax:  [phone] Progress Note Reporting Period 07/28/18 to 08/28/18 for the first 10 visits   See note below for Objective Data and Assessment of Progress/Goals.      Physical Therapy Treatment  Patient Details  Name: Douglas Lane MRN: [id number] Date of Birth: 1946/07/14 Referring Provider (PT): Aluisio   Encounter Date: 08/28/2018  PT End of Session - 08/28/18 0925    Visit Number  10    Date for PT Re-Evaluation  09/28/18    PT Start Time  0844    PT Stop Time  0940    PT Time Calculation (min)  56 min    Activity Tolerance  Patient tolerated treatment well    Behavior During Therapy  [facility] for tasks assessed/performed       Past Medical History:  Diagnosis Date  . Aortic atherosclerosis ([place]) 03/08/2017   CT  . Aortic stenosis    severe AS s/p TAVR (03/22/17) with a 41m Edwards Sapien 3 THV via the TF approach  . Arthritis    "pain in my knees" (03/30/2016)  . Bilateral cataracts   . CAD S/P percutaneous coronary angioplasty 03/31/2016   a. CP/USA -> LHC 03/31/16: 90% prox-mid RCA s/p DES, 40% prox-mid LAD, 25% D2, 25% prox-mid Cx, elevated LVEDP 33.  . Chronic diastolic CHF (congestive heart failure) ([place] 03/31/2016   pt unaware, has family history  . Chronic lower back pain   . DDD (degenerative disc disease), lumbar   . Dyslipidemia   . Grade II diastolic dysfunction   . Hearing loss   . Heart murmur   . Heartburn    occ. takes TUMS  . Humeral fracture 07/2014   Right  . Kidney cysts    1.6 cm simple cyst right  . LVH (left ventricular hypertrophy)    a. severe basal septal hypertrophy by echo 03/2016.  . Mitral regurgitation    a. mild by echo 03/2016.  . Osteopenia 03/13/2009  . Psoriasis   . Rheumatic fever    as a child  . Scoliosis 03/13/2009  . [place] 01/[place]; 07/2011; 07/2015   "idiopathic; been on  Kepra since 07/2011", last seizure poss. 07/2015, absent seizure  . Sinus bradycardia - baseline HR 50s at times.   . Sleep apnea    never had sleep study done, pt. unaware  . 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 Thrombosed hemorrhoids     Past Surgical History:  Procedure Laterality Date  . APPENDECTOMY    . BACK SURGERY    . CARDIAC CATHETERIZATION N/A 03/30/2016   Procedure: Right/Left Heart Cath and Coronary Angiography;  Surgeon: JJettie Booze MD;  [facility];  Service: Cardiovascular;  Laterality: N/A;  . CARDIAC CATHETERIZATION N/A 03/30/2016   Procedure: Coronary Stent Intervention;  Surgeon: JJettie Booze MD;  [facility];  Service: Cardiovascular;  Laterality: N/A;  . CATARACT EXTRACTION, BILATERAL    . COLONOSCOPY    . CORONARY ANGIOPLASTY    . FOREARM FRACTURE SURGERY Right ~ 2003-2004   "got steel plate in there"  . HERNIA REPAIR  2009  . I&D hemorrhoirds    . KNEE ARTHROSCOPY Bilateral   . MOHS SURGERY Left    temple  . ORIF SCAPULAR FRACTURE Right    right  . POSTERIOR LUMBAR FUSION  2004  .  TEE WITHOUT CARDIOVERSION N/A 11/01/2016   Procedure: TRANSESOPHAGEAL ECHOCARDIOGRAM (TEE);  Surgeon: Sanda Klein, MD;  Location: [place];  Service: Cardiovascular;  Laterality: N/A;  . TEE WITHOUT CARDIOVERSION N/A 03/22/2017   Procedure: TRANSESOPHAGEAL ECHOCARDIOGRAM (TEE);  Surgeon: Sherren Mocha, MD;  Location: [place];  Service: Open Heart Surgery;  Laterality: N/A;  . TONSILLECTOMY    . TOTAL KNEE ARTHROPLASTY Right 07/24/2018   Procedure: RIGHT TOTAL KNEE ARTHROPLASTY;  Surgeon: Gaynelle Arabian, MD;  [facility];  Service: Orthopedics;  Laterality: Right;  8mn  . TRANSCATHETER AORTIC VALVE REPLACEMENT, TRANSFEMORAL N/A 03/22/2017   Procedure: TRANSCATHETER AORTIC VALVE REPLACEMENT, TRANSFEMORAL;  Surgeon: CSherren Mocha MD;  [facility]  Service: Open Heart Surgery;  Laterality: N/A;    There  were no vitals filed for this visit.  Subjective Assessment - 08/28/18 0845    Subjective  "Considering it is the middle of the night pretty good"    Currently in Pain?  Yes    Pain Score  1     Pain Location  Knee    Pain Orientation  Right                       [facility] Adult PT Treatment/Exercise - 08/28/18 0001      Ambulation/Gait   Stairs  Yes    Stairs Assistance  5: Supervision    Stair Management Technique  Alternating pattern;No rails;One rail Right    Number of Stairs  72    Height of Stairs  6    Gait Comments  Eccentric load weakness and pain, up step with no rail       Knee/Hip Exercises: Aerobic   Recumbent Bike  L3 x 4 min     Nustep  level 4 x 5 minutes LE only       Knee/Hip Exercises: Machines for Strengthening   Cybex Knee Extension  10lb x10, RLE 10lb 2x10     Cybex Knee Flexion  35lb 2x10, RLE 20lb 2x10     Cybex Leg Press  30# 3 sets 10 with focus on TKE, Heel raises 30lb 2x10, RLE30lb 2x10        Knee/Hip Exercises: Standing   Lateral Step Up  Right;Hand Hold: 0;Step Height: 6";15 reps;1 set      Vasopneumatic   Number Minutes Vasopneumatic   15 minutes    Vasopnuematic Location   Knee    Vasopneumatic Pressure  Medium    Vasopneumatic Temperature   35               PT Short Term Goals - 08/02/18 1144      PT SHORT TERM GOAL #1   Title  independent with intiial HEP    Status  Achieved        PT Long Term Goals - 08/25/18 0939      PT LONG TERM GOAL #1   Title  ambulate community distances without device or with SPC    Status  Achieved      PT LONG TERM GOAL #2   Title  decrease pain 50%    Status  Partially Met      PT LONG TERM GOAL #3   Title  increase AROM to 5-115 degrees flexion    Status  Partially Met      PT LONG TERM GOAL #4   Title  increase strength to 4+/5    Status  Achieved      PT LONG TERM  GOAL #5   Title  decrease swelling 2 cm    Status  Achieved            Plan - 08/28/18 0929     Clinical Impression Statement  Pt continues to do well overall. Stair negotiation was very taxing on patient. R knee pain noted with eccentric loads on stairs. Increase weight tolerated with leg curls and extensions.     PT Frequency  3x / week    PT Duration  8 weeks    PT Treatment/Interventions  ADLs/Self Care Home Management;Electrical Stimulation;Gait training;Stair training;Functional mobility training;Therapeutic activities;Therapeutic exercise;Patient/family education;Neuromuscular re-education;Balance training;Manual techniques;Vasopneumatic Device;Passive range of motion    PT Next Visit Plan  strength, gait and function- high level plus add SLS and balance       Patient will benefit from skilled therapeutic intervention in order to improve the following deficits and impairments:  Abnormal gait, Pain, Decreased scar mobility, Cardiopulmonary status limiting activity, Decreased activity tolerance, Decreased range of motion, Decreased strength, Decreased balance, Difficulty walking, Increased edema, Impaired flexibility  Visit Diagnosis: Difficulty in walking, not elsewhere classified  Acute pain of right knee  Localized edema  Stiffness of right knee, not elsewhere classified     Problem List Patient Active Problem List   Diagnosis Date Noted  . OA (osteoarthritis) of knee 07/24/2018  . Chronic diastolic heart failure ([place]) 11/13/2017  . Benign essential HTN 11/13/2017  . S/P TAVR (transcatheter aortic valve replacement) 03/22/2017  . Severe aortic stenosis 03/22/2017  . Bradycardia, drug induced 10/28/2016  . Dyslipidemia 03/31/2016  . Mitral regurgitation 03/31/2016  . CAD S/P percutaneous coronary angioplasty 03/31/2016  . Abnormal nuclear stress test   . Heart murmur 03/06/2015  . Abnormal EKG 03/06/2015  . LVH (left ventricular hypertrophy) 03/06/2015  . Rheumatic fever   . 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 Hemorrhoid, thrombosed. 12/24/2011    Scot Jun, PTA 08/28/2018, 9:31 AM  [address] [place], [place], [address] Phone: [phone]   Fax:  [phone]  Name: CARR SHARTZER MRN: [id number] Date of Birth: 1946-04-04

## 2018-08-30 ENCOUNTER — Encounter: Payer: Self-pay | Admitting: Physical Therapy

## 2018-08-30 ENCOUNTER — Ambulatory Visit: Payer: Medicare Other | Admitting: Physical Therapy

## 2018-08-30 DIAGNOSIS — M25561 Pain in right knee: Secondary | ICD-10-CM

## 2018-08-30 DIAGNOSIS — R600 Localized edema: Secondary | ICD-10-CM

## 2018-08-30 DIAGNOSIS — R262 Difficulty in walking, not elsewhere classified: Secondary | ICD-10-CM | POA: Diagnosis not present

## 2018-08-30 DIAGNOSIS — M25661 Stiffness of right knee, not elsewhere classified: Secondary | ICD-10-CM

## 2018-08-30 NOTE — Therapy (Signed)
[address] [place], [place], [address] Phone: [phone]   Fax:  [phone]  Physical Therapy Treatment  Patient Details  Name: Douglas Lane MRN: [id number] Date of Birth: 1945/09/20 Referring Provider (PT): Aluisio   Encounter Date: 08/30/2018  PT End of Session - 08/30/18 1017    Visit Number  11    Date for PT Re-Evaluation  09/28/18    PT Start Time  1430    PT Stop Time  1520    PT Time Calculation (min)  50 min    Activity Tolerance  Patient tolerated treatment well    Behavior During Therapy  [facility] for tasks assessed/performed       Past Medical History:  Diagnosis Date  . Aortic atherosclerosis ([place]) 03/08/2017   CT  . Aortic stenosis    severe AS s/p TAVR (03/22/17) with a 53m Edwards Sapien 3 THV via the TF approach  . Arthritis    "pain in my knees" (03/30/2016)  . Bilateral cataracts   . CAD S/P percutaneous coronary angioplasty 03/31/2016   a. CP/USA -> LHC 03/31/16: 90% prox-mid RCA s/p DES, 40% prox-mid LAD, 25% D2, 25% prox-mid Cx, elevated LVEDP 33.  . Chronic diastolic CHF (congestive heart failure) ([place] 03/31/2016   pt unaware, has family history  . Chronic lower back pain   . DDD (degenerative disc disease), lumbar   . Dyslipidemia   . Grade II diastolic dysfunction   . Hearing loss   . Heart murmur   . Heartburn    occ. takes TUMS  . Humeral fracture 07/2014   Right  . Kidney cysts    1.6 cm simple cyst right  . LVH (left ventricular hypertrophy)    a. severe basal septal hypertrophy by echo 03/2016.  . Mitral regurgitation    a. mild by echo 03/2016.  . Osteopenia 03/13/2009  . Psoriasis   . Rheumatic fever    as a child  . Scoliosis 03/13/2009  . Seizure ([place] 01/2011; 07/2011; 07/2015   "idiopathic; been on Kepra since 07/2011", last seizure poss. 07/2015, absent seizure  . Sinus bradycardia - baseline HR 50s at times.   . Sleep apnea    never had sleep study  done, pt. unaware  . 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 Thrombosed hemorrhoids     Past Surgical History:  Procedure Laterality Date  . APPENDECTOMY    . BACK SURGERY    . CARDIAC CATHETERIZATION N/A 03/30/2016   Procedure: Right/Left Heart Cath and Coronary Angiography;  Surgeon: JJettie Booze MD;  [facility];  Service: Cardiovascular;  Laterality: N/A;  . CARDIAC CATHETERIZATION N/A 03/30/2016   Procedure: Coronary Stent Intervention;  Surgeon: JJettie Booze MD;  [facility];  Service: Cardiovascular;  Laterality: N/A;  . CATARACT EXTRACTION, BILATERAL    . COLONOSCOPY    . CORONARY ANGIOPLASTY    . FOREARM FRACTURE SURGERY Right ~ 2003-2004   "got steel plate in there"  . HERNIA REPAIR  2009  . I&D hemorrhoirds    . KNEE ARTHROSCOPY Bilateral   . MOHS SURGERY Left    temple  . ORIF SCAPULAR FRACTURE Right    right  . POSTERIOR LUMBAR FUSION  2004  . TEE WITHOUT CARDIOVERSION N/A 11/01/2016   Procedure: TRANSESOPHAGEAL ECHOCARDIOGRAM (TEE);  Surgeon: MSanda Klein MD;  [place] [place]  Service: Cardiovascular;  Laterality: N/A;  .  TEE WITHOUT CARDIOVERSION N/A 03/22/2017   Procedure: TRANSESOPHAGEAL ECHOCARDIOGRAM (TEE);  Surgeon: Sherren Mocha, MD;  Location: [place];  Service: Open Heart Surgery;  Laterality: N/A;  . TONSILLECTOMY    . TOTAL KNEE ARTHROPLASTY Right 07/24/2018   Procedure: RIGHT TOTAL KNEE ARTHROPLASTY;  Surgeon: Gaynelle Arabian, MD;  [facility];  Service: Orthopedics;  Laterality: Right;  40mn  . TRANSCATHETER AORTIC VALVE REPLACEMENT, TRANSFEMORAL N/A 03/22/2017   Procedure: TRANSCATHETER AORTIC VALVE REPLACEMENT, TRANSFEMORAL;  Surgeon: CSherren Mocha MD;  [facility]  Service: Open Heart Surgery;  Laterality: N/A;    There were no vitals filed for this visit.  Subjective Assessment - 08/30/18 1432    Subjective  "Im ok"    Currently in Pain?  No/denies                        [facility] - 08/[facility] 0001      Ambulation/Gait   Stairs  Yes    Stairs Assistance  5: Supervision    Stair Management Technique  Alternating pattern;No rails;One rail Right    Number of Stairs  72    Height of Stairs  6    Gait Comments  2 flights then back doen outside up hill       Knee/Hip Exercises: Aerobic   Recumbent Bike  L3 x 4 min     Nustep  level 5 x 5 minutes LE only       Knee/Hip Exercises: Machines for Strengthening   Cybex Knee Extension  RLE 10lb 2x10     Cybex Knee Flexion  RLE 20lb 2x10     Cybex Leg Press  50# 3 sets 15, Heel raises 50lb 2x20, RLE TKE 30lb 2x10        Knee/Hip Exercises: Standing   Forward Step Up  Right;Hand Hold: 0;Step Height: 8";10 reps;2 sets    Walking with Sports Cord  40lb 4 way x3       Knee/Hip Exercises: Seated   Sit to Sand  2 sets;10 reps;without UE support   holding yellow ball      Modalities   Modalities  Cryotherapy      Cryotherapy   Number Minutes Cryotherapy  10 Minutes    Cryotherapy Location  Knee    Type of Cryotherapy  Ice pack               PT Short Term Goals - 08/02/18 1144      PT SHORT TERM GOAL #1   Title  independent with intiial HEP    Status  Achieved        PT Long Term Goals - 08/25/18 0939      PT LONG TERM GOAL #1   Title  ambulate community distances without device or with SPC    Status  Achieved      PT LONG TERM GOAL #2   Title  decrease pain 50%    Status  Partially Met      PT LONG TERM GOAL #3   Title  increase AROM to 5-115 degrees flexion    Status  Partially Met      PT LONG TERM GOAL #4   Title  increase strength to 4+/5    Status  Achieved      PT LONG TERM GOAL #5   Title  decrease swelling 2 cm    Status  Achieved  Plan - 08/30/18 1512    Clinical Impression Statement  Pt does reports some bilat knee pain with stair negotiation. He was able to progress with up hill ambulation,  maintaining appropriate gait. All other exercises completed well but he does fatigue with the added weight with some interventions.     Rehab Potential  Good    PT Frequency  3x / week    PT Duration  8 weeks    PT Treatment/Interventions  ADLs/Self Care Home Management;Electrical Stimulation;Gait training;Stair training;Functional mobility training;Therapeutic activities;Therapeutic exercise;Patient/family education;Neuromuscular re-education;Balance training;Manual techniques;Vasopneumatic Device;Passive range of motion    PT Next Visit Plan  strength, gait and function- high level plus add SLS and balance       Patient will benefit from skilled therapeutic intervention in order to improve the following deficits and impairments:  Abnormal gait, Pain, Decreased scar mobility, Cardiopulmonary status limiting activity, Decreased activity tolerance, Decreased range of motion, Decreased strength, Decreased balance, Difficulty walking, Increased edema, Impaired flexibility  Visit Diagnosis: Difficulty in walking, not elsewhere classified  Stiffness of right knee, not elsewhere classified  Localized edema  Acute pain of right knee     Problem List Patient Active Problem List   Diagnosis Date Noted  . OA (osteoarthritis) of knee 07/24/2018  . Chronic diastolic heart failure ([place]) 11/13/2017  . Benign essential HTN 11/13/2017  . S/P TAVR (transcatheter aortic valve replacement) 03/22/2017  . Severe aortic stenosis 03/22/2017  . Bradycardia, drug induced 10/28/2016  . Dyslipidemia 03/31/2016  . Mitral regurgitation 03/31/2016  . CAD S/P percutaneous coronary angioplasty 03/31/2016  . Abnormal nuclear stress test   . Heart murmur 03/06/2015  . Abnormal EKG 03/06/2015  . LVH (left ventricular hypertrophy) 03/06/2015  . Rheumatic fever   . Localization-related idiopathic epilepsy and epileptic syndromes with seizures of localized onset, not intractable, without status epilepticus ([place])  08/23/2014  . Hemorrhoid, thrombosed. 12/24/2011    Scot Jun, PTA 08/30/2018, 3:14 PM  [address] [place], [place], [address] Phone: [phone]   Fax:  [phone]  Name: WESLEE FOGG MRN: [id number] Date of Birth: September 18, 1945

## 2018-10-26 ENCOUNTER — Ambulatory Visit (HOSPITAL_COMMUNITY): Payer: Medicare Other | Attending: Cardiology

## 2018-10-26 ENCOUNTER — Other Ambulatory Visit: Payer: Self-pay

## 2018-10-26 DIAGNOSIS — Z952 Presence of prosthetic heart valve: Secondary | ICD-10-CM | POA: Diagnosis present

## 2018-11-17 ENCOUNTER — Other Ambulatory Visit: Payer: Self-pay | Admitting: Cardiology

## 2018-11-28 ENCOUNTER — Telehealth: Payer: Self-pay | Admitting: Cardiology

## 2018-11-28 NOTE — Telephone Encounter (Signed)
Phone/doximityverbal consent/4/14/2/can't check vitals per patient,

## 2018-11-29 ENCOUNTER — Telehealth: Payer: Self-pay | Admitting: Cardiology

## 2018-11-29 DIAGNOSIS — E785 Hyperlipidemia, unspecified: Secondary | ICD-10-CM

## 2018-11-29 NOTE — Telephone Encounter (Signed)
° ° °  Patient request order for labs (scheduled 8/5)

## 2018-11-30 ENCOUNTER — Telehealth: Payer: Medicare Other | Admitting: Cardiology

## 2018-11-30 NOTE — Telephone Encounter (Signed)
FLP and ALT 

## 2018-11-30 NOTE — Telephone Encounter (Signed)
What labs do you want to be drawn before his visit?

## 2018-12-01 NOTE — Telephone Encounter (Signed)
Orders placed.

## 2018-12-31 ENCOUNTER — Other Ambulatory Visit: Payer: Self-pay | Admitting: Cardiology

## 2019-03-02 ENCOUNTER — Telehealth: Payer: Self-pay | Admitting: *Deleted

## 2019-03-02 NOTE — Telephone Encounter (Signed)
   [facility] Pre-operative Risk Assessment    Request for surgical clearance:  1. What type of surgery is being performed? LEFT TOTAL KNEE ARTHROPLASTY  2. When is this surgery scheduled? 06/11/19   3. What type of clearance is required (medical clearance vs. Pharmacy clearance to hold med vs. Both)? MEDICAL  4. Are there any medications that need to be held prior to surgery and how long? ASA   5. Practice name and name of physician performing surgery? [facility]; DR. Wynelle Link   6. What is your office phone number [phone]    7.   What is your office fax number [phone]  8.   Anesthesia type (None, local, MAC, general) ? CHOICE   Douglas Lane 03/02/2019, 1:37 PM  _________________________________________________________________   (provider comments below)

## 2019-03-02 NOTE — Telephone Encounter (Signed)
Surgery will be in Oct, patient to see Dr Radford Pax in August, clearance to addressed then.  Kerin Ransom PA-C 03/02/2019 1:55 PM

## 2019-03-20 ENCOUNTER — Ambulatory Visit: Payer: Self-pay | Admitting: Neurology

## 2019-03-21 ENCOUNTER — Other Ambulatory Visit: Payer: Self-pay

## 2019-03-21 ENCOUNTER — Other Ambulatory Visit: Payer: Medicare Other | Admitting: *Deleted

## 2019-03-21 DIAGNOSIS — E785 Hyperlipidemia, unspecified: Secondary | ICD-10-CM

## 2019-03-21 LAB — HEPATIC FUNCTION PANEL
ALT: 28 IU/L (ref 0–44)
AST: 38 IU/L (ref 0–40)
Albumin: 4.4 g/dL (ref 3.7–4.7)
Alkaline Phosphatase: 83 IU/L (ref 39–117)
Bilirubin Total: 1.1 mg/dL (ref 0.0–1.2)
Bilirubin, Direct: 0.32 mg/dL (ref 0.00–0.40)
Total Protein: 6.1 g/dL (ref 6.0–8.5)

## 2019-03-21 LAB — LIPID PANEL
Chol/HDL Ratio: 2.4 ratio (ref 0.0–5.0)
Cholesterol, Total: 125 mg/dL (ref 100–199)
HDL: 52 mg/dL (ref >39)
LDL Calculated: 60 mg/dL (ref 0–99)
Triglycerides: 67 mg/dL (ref 0–149)
VLDL Cholesterol Cal: 13 mg/dL (ref 5–40)

## 2019-03-22 ENCOUNTER — Telehealth: Payer: Self-pay

## 2019-03-22 NOTE — Telephone Encounter (Signed)
-----   Message from Sueanne Margarita, MD sent at 03/21/2019  5:25 PM EDT ----- Lipids at goal continue current therapy and forward to PCP

## 2019-03-22 NOTE — Telephone Encounter (Signed)
Notes recorded by Frederik Schmidt, RN on 03/22/2019 at 9:32 AM EDT  The patient has been notified of the result and verbalized understanding. All questions (if any) were answered.  Frederik Schmidt, RN 03/22/2019 9:32 AM

## 2019-03-26 ENCOUNTER — Ambulatory Visit (INDEPENDENT_AMBULATORY_CARE_PROVIDER_SITE_OTHER): Payer: Medicare Other | Admitting: Neurology

## 2019-03-26 ENCOUNTER — Encounter: Payer: Self-pay | Admitting: Neurology

## 2019-03-26 ENCOUNTER — Other Ambulatory Visit: Payer: Self-pay

## 2019-03-26 DIAGNOSIS — G40009 Localization-related (focal) (partial) idiopathic epilepsy and epileptic syndromes with seizures of localized onset, not intractable, without status epilepticus: Secondary | ICD-10-CM

## 2019-03-26 MED ORDER — LEVETIRACETAM 750 MG PO TABS: 750.0000 mg | ORAL_TABLET | Freq: Two times a day (BID) | ORAL | 3 refills | Status: DC

## 2019-03-26 NOTE — Patient Instructions (Signed)
Great seeing you! Continue Keppra 750mg  twice a day. Wishing you well with your surgery! Follow-up in 1 year, call for any changes  Seizure Precautions: 1. If medication has been prescribed for you to prevent seizures, take it exactly as directed.  Do not stop taking the medicine without talking to your doctor first, even if you have not had a seizure in a long time.   2. Avoid activities in which a seizure would cause danger to yourself or to others.  Don't operate dangerous machinery, swim alone, or climb in high or dangerous places, such as on ladders, roofs, or girders.  Do not drive unless your doctor says you may.  3. If you have any warning that you may have a seizure, lay down in a safe place where you can't hurt yourself.    4.  No driving for 6 months from last seizure, as per [facility].   Please refer to the following link on the [place] website for more information: http://www.epilepsyfoundation.org/answerplace/Social/driving/drivingu.cfm   5.  Maintain good sleep hygiene. Avoid alcohol.  6.  Contact your doctor if you have any problems that may be related to the medicine you are taking.  7.  Call 911 and bring the patient back to the ED if:        A.  The seizure lasts longer than 5 minutes.       B.  The patient doesn't awaken shortly after the seizure  C.  The patient has new problems such as difficulty seeing, speaking or moving  D.  The patient was injured during the seizure  E.  The patient has a temperature over 102 F (39C)  F.  The patient vomited and now is having trouble breathing

## 2019-03-26 NOTE — Progress Notes (Signed)
NEUROLOGY FOLLOW UP OFFICE NOTE  LEONDRE TAUL [id number]  DOB: 07/16/1946  HISTORY OF PRESENT ILLNESS: I had the pleasure of seeing Mason Dibiasio in follow-up in the neurology clinic on 03/26/2019.  The patient was last seen 7 months ago for recurrent seizures. He had been seizure-free for a year off Keppra, and had tapered of clorazepate which he was taking for anxiety. He had a generalized convulsion on 07/31/14, 3 days after stopping clorazepate. He was restarted on Keppra 500mg  BID. His wife had also reported that after stopping the Keppra, he had prolonged episodes of memory lapses where he could not recall the last few hours. He had an amnestic episode in July 2017 and has been taking Keppra 750mg  BID since then. He had a brief episode in May 2019 which may have occurred in the setting of sleep deprivation, and a brief one in the summer of 2019. He denies any further gaps in time. He denies any staring/unresponsive episodes, olfactory/gustatory hallucinations, focal numbness/tingling/weakness, myoclonic jerks. No significant headaches, dizziness. He tripped in the garden, no injuries. He was previously reporting poor sleep, side effects on nortriptyline, Trazodone, and amitriptyline. He gets around 8-9 hours of sleep but feels tired in the morning, mostly due to back and knee pain. He is scheduled for left knee surgery in October 2020.   History on Initial Assessment 08/20/2014: This is a 73 yo RH man with a history of 2 seizures in 2012 that occurred 6 months apart, presenting after ER visit for a seizure last 08/03/14. The first seizure occurred in June 2012. He recalls it was a stressful period just soon after he retired. He had a nocturnal convulsion and was brought to [facility]. He was not started on medication initially as it was his first seizure. On December 2012, he had another nocturnal convulsion and was started on Keppra 500mg  BID with no further seizures for 3 years. He and  his neurologist in [facility] agreed to start weaning off Keppra, and this was completely discontinued the spring/summer of 2015. Around 2-1/2 years ago, he had also been reporting anxiety to his neurologist, and was started on clorazepate 7.5mg , weaned to 1/2 tab for 6 months, then discontinued 3 days prior to the most recent nocturnal seizure on 12/16 off AEDs. He was brought to [facility] ER, there is no bloodwork or drug screen for review, CT head unremarkable.He was found to have posterior dislocation and comminuted fracture of the right humeral head. He had soft tissue injury extending along the right anterior proximal arm. This was reduced in the ER and he continues to wear a sling. He was restarted on Keppra 500mg  BID with no side effects except for mild drowsiness.   His wife reports that since coming off the [place] last year, he has had 3 incidences of memory lapses, where he could not recall the last few hours. His wife reports that he would look confused but he can function and drive. The last episode occurred 6 weeks ago, he did not recall being in church. His wife reports he was talking fine but had a different look about him. This lasted about 1-2 hours. He had neuropsychological testing for memory loss and was told he has "short-term memory problems." In hindsight, when asked about an MRI done in January 2012, he had an episode where he had transient memory loss while he was at a [place]. I personally reviewed MRI brain without contrast done which showed mild chronic microvascular changes  in the bilateral subcortical regions and right central pons.  He denies any episodes of staring/unresponsiveness, no olfactory/gustatory hallucinations, deja vu, rising epigastric sensation, focal numbness/tingling/weakness, myoclonic jerks. He has 1-4 drinks daily (beer or whisky), with no change in pattern for many years. He denies any sleep deprivation prior to the seizures, but reports these being stressful  periods. 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 There is no history of febrile convulsions, CNS infections such as meningitis/encephalitis, significant traumatic brain injury, neurosurgical procedures, or family history of seizures.   PAST MEDICAL HISTORY: Past Medical History:  Diagnosis Date  . Aortic atherosclerosis ([place]) 03/08/2017   CT  . 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 Arthritis    "pain in my knees" (03/30/2016)  . Bilateral cataracts   . CAD S/P percutaneous coronary angioplasty 03/31/2016   a. CP/USA -> LHC 03/31/16: 90% prox-mid RCA s/p DES, 40% prox-mid LAD, 25% D2, 25% prox-mid Cx, elevated LVEDP 33.  . Chronic diastolic CHF (congestive heart failure) ([place]) 03/31/2016   pt unaware, has family history  . Chronic lower back pain   . DDD (degenerative disc disease), lumbar   . Dyslipidemia   . Grade II diastolic dysfunction   . Hearing loss   . Heart murmur   . Heartburn    occ. takes TUMS  . Humeral fracture 07/2014   Right  . Kidney cysts    1.6 cm simple cyst right  . LVH (left ventricular hypertrophy)    a. severe basal septal hypertrophy by echo 03/2016.  . Mitral regurgitation    a. mild by echo 03/2016.  . Osteopenia 03/13/2009  . Psoriasis   . Rheumatic fever    as a child  . Scoliosis 03/13/2009  . 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 07/2015, absent seizure  . Sinus bradycardia - baseline HR 50s at times.   . Sleep apnea    never had sleep study done, pt. unaware  . 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 Thrombosed hemorrhoids     MEDICATIONS: Current Outpatient Medications on File Prior to Visit  Medication Sig Dispense Refill  . acetaminophen (TYLENOL) 500 MG tablet Take 1,000 mg by mouth every 6 (six) hours as  needed for moderate pain or headache.     Marland Kitchen amitriptyline (ELAVIL) 25 MG tablet Take 1 tablet every night for 3 days, then increase to 2 tablets every night 60 tablet 11  . aspirin EC 81 MG tablet Take 81 mg by mouth daily.    . calcium carbonate (TUMS - DOSED IN MG ELEMENTAL CALCIUM) 500 MG chewable tablet Chew 1 tablet by mouth daily as needed for heartburn.     . clobetasol (TEMOVATE) 0.05 % external solution Apply 1 application topically 2 (two) times daily as needed (for psoriasis).    . clobetasol ointment (TEMOVATE) 5.46 % Apply 1 application topically 2 (two) times daily as needed (for psoriasis).    Marland Kitchen desonide (DESOWEN) 0.05 % ointment Apply 1 application topically daily as needed (psoriasis).     . diphenhydrAMINE (BENADRYL) 25 mg capsule Take 25 mg by mouth daily as needed for allergies.    Marland Kitchen ezetimibe (ZETIA) 10 MG tablet TAKE 1 TABLET(10 MG) BY MOUTH DAILY 30 tablet  3  . HUMIRA PEN 40 MG/0.8ML PNKT Inject 40 mg into the skin See admin instructions. Inject 40 mg SQ every 5-6 weeks    . levETIRAcetam (KEPPRA) 750 MG tablet Take 1 tablet (750 mg total) by mouth 2 (two) times daily. 180 tablet 3  . methocarbamol (ROBAXIN) 500 MG tablet Take 1 tablet (500 mg total) by mouth every 6 (six) hours as needed for muscle spasms. 40 tablet 0  . nitroGLYCERIN (NITROSTAT) 0.4 MG SL tablet DISSOLVE 1 TABLET UNDER THE TONGUE EVERY 5 MINUTES AS NEEDED FOR CHEST PAIN( UP TO 3 DOSES) (Patient taking differently: Place 0.4 mg under the tongue every 5 (five) minutes as needed for chest pain. ) 25 tablet 5  . oxyCODONE (OXY IR/ROXICODONE) 5 MG immediate release tablet Take 1-2 tablets (5-10 mg total) by mouth every 6 (six) hours as needed for severe pain (pain score 4-6). 56 tablet 0  . PROCTOZONE-HC 2.5 % rectal cream Apply 1 application topically daily as needed for hemorrhoids or itching.     . rosuvastatin (CRESTOR) 40 MG tablet TAKE 1 TABLET(40 MG) BY MOUTH DAILY 90 tablet 3  . shark liver oil-cocoa  butter (PREPARATION H) 0.25-3-85.5 % suppository Place 1 suppository rectally as needed for hemorrhoids.    . traMADol (ULTRAM) 50 MG tablet Take 1-2 tablets (50-100 mg total) by mouth every 6 (six) hours as needed for moderate pain. 40 tablet 0   No current facility-administered medications on file prior to visit.     ALLERGIES: No Known Allergies  FAMILY HISTORY: Family History  Problem Relation Age of Onset  . Renal Disease Mother   . Congestive Heart Failure Father   . Depression Sister     SOCIAL HISTORY: Social History   Socioeconomic History  . Marital status: Married    Spouse name: Not on file  . Number of children: Not on file  . Years of education: Not on file  . Highest education level: Not on file  Occupational History  . Not on file  Social Needs  . Financial resource strain: Not on file  . Food insecurity    Worry: Not on file    Inability: Not on file  . Transportation needs    Medical: Not on file    Non-medical: Not on file  Tobacco Use  . Smoking status: Never Smoker  . Smokeless tobacco: Never Used  Substance and Sexual Activity  . Alcohol use: Yes    Alcohol/week: 14.0 standard drinks    Types: 7 Shots of liquor, 7 Cans of beer per week  . Drug use: No  . Sexual activity: Not Currently  Lifestyle  . Physical activity    Days per week: Not on file    Minutes per session: Not on file  . Stress: Not on file  Relationships  . Social Herbalist on phone: Not on file    Gets together: Not on file    Attends religious service: Not on file    Active member of club or organization: Not on file    Attends meetings of clubs or organizations: Not on file    Relationship status: Not on file  . Intimate partner violence    Fear of current or ex partner: Not on file    Emotionally abused: Not on file    Physically abused: Not on file    Forced sexual activity: Not on file  Other Topics Concern  . Not on file  Social History Narrative   .  Not on file    REVIEW OF SYSTEMS: Constitutional: No fevers, chills, or sweats, no generalized fatigue, change in appetite Eyes: No visual changes, double vision, eye pain Ear, nose and throat: No hearing loss, ear pain, nasal congestion, sore throat Cardiovascular: No chest pain, palpitations Respiratory:  No shortness of breath at rest or with exertion, wheezes GastrointestinaI: No nausea, vomiting, diarrhea, abdominal pain, fecal incontinence Genitourinary:  No dysuria, urinary retention or frequency Musculoskeletal:  +joint pain, left knee pain Integumentary: No rash, pruritus, skin lesions Neurological: as above Psychiatric: + depression,no insomnia, anxiety Endocrine: No palpitations, fatigue, diaphoresis, mood swings, change in appetite, change in weight, increased thirst Hematologic/Lymphatic:  No anemia, purpura, petechiae. Allergic/Immunologic: no itchy/runny eyes, nasal congestion, recent allergic reactions, rashes  PHYSICAL EXAM: Vitals:   03/26/19 1429  BP: (!) 166/74  Pulse: 70  SpO2: 94%   General: No acute distress, flat affect Head:  Normocephalic/atraumatic Skin/Extremities: No rash, no edema Neurological Exam: alert and oriented to person, place, and time. No aphasia or dysarthria. Fund of knowledge is appropriate.  Recent and remote memory are intact. Attention and concentration are normal.    Able to name objects and repeat phrases. Cranial nerves: Pupils equal, round, reactive to light.  Extraocular movements intact with no nystagmus. Visual fields full. Facial sensation intact. No facial asymmetry. Tongue, uvula, palate midline.  Motor: Bulk and tone normal, muscle strength 5/5 throughout with no pronator drift.  Sensation to light touch intact.  No extinction to double simultaneous stimulation. Finger to nose testing intact.  Gait slow and cautious with left knee pain  IMPRESSION: This is a 73 yo RH man with a history of 2 convulsive seizures in 2012. As he  had been seizure-free for 3 years, Keppra was tapered off last spring/summer 2015. He had been taking clorazepate for anxiety and was tapered off this 3 days prior to the nocturnal seizure in 08/03/14. No further convulsions since 2015, however his wife reported episodes of loss of time while off Keppra, concerning for partial seizures arising from the temporal lobe. He had one in July 2017, May 2019, and summer 2019. None since. Continue Levetiracetam 750mg  BID. Sleep is fair, if worsening we will refer to Sleep medicine. He is aware of [place] driving laws that indicate one has to stop driving after a seizure, until 6 months seizure-free. He will follow-up in 1 year or earlier if needed.   Thank you for allowing me to participate in his care.  Please do not hesitate to call for any questions or concerns.   Ellouise Newer, M.D.   CC: Dr. Harrington Challenger

## 2019-03-28 ENCOUNTER — Ambulatory Visit: Payer: Medicare Other | Admitting: Cardiology

## 2019-04-09 ENCOUNTER — Other Ambulatory Visit: Payer: Self-pay | Admitting: Cardiology

## 2019-04-09 MED ORDER — EZETIMIBE 10 MG PO TABS: ORAL_TABLET | ORAL | 0 refills | Status: DC

## 2019-04-09 NOTE — Telephone Encounter (Signed)
Pt's medication was sent to pt's pharmacy as requested. Confirmation received.  °

## 2019-05-09 NOTE — Progress Notes (Signed)
Cardiology Office Note:    Date:  05/10/2019   ID:  Douglas Lane, DOB Mar 04, 1946, MRN [id number]  PCP:  Lawerance Cruel, MD  Cardiologist:  Fransico Him, MD    Referring MD: Lawerance Cruel, MD   Chief Complaint  Patient presents with  . Coronary Artery Disease  . Hypertension  . Congestive Heart Failure  . Aortic Stenosis    History of Present Illness:    Douglas Lane is a 73 y.o. male with a hx of HLD, CAD s/p DES to RCA (03/2016)and 40% prox LAD and 50% ostial PDA,HTN, chronic diastolic CHF and severe AS s/p TAVR (03/22/17) with a39mm Edwards Sapien 3 THV via the TF approach.Echo post TAVR showed normal LVF with G2DD with stable TAVR with AVA 2cm2, mean AVG 59mmHg and peak AVG 61mmHg.  He is here today for followup and is doing well.  He denies any chest pain or pressure,  PND, orthopnea, LE edema, dizziness, palpitations or syncope. He does have chronic DOE with walking up steep inclines but can continue to walk.  This is unchanged. He is compliant with his meds and is tolerating meds with no SE.    Past Medical History:  Diagnosis Date  . Aortic atherosclerosis ([place]) 03/08/2017   CT  . Aortic stenosis    severe AS s/p TAVR (03/22/17) with a 86mm Edwards Sapien 3 THV via the TF approach  . Arthritis    "pain in my knees" (03/30/2016)  . Bilateral cataracts   . CAD S/P percutaneous coronary angioplasty 03/31/2016   a. CP/USA -> LHC 03/31/16: 90% prox-mid RCA s/p DES, 40% prox-mid LAD, 25% D2, 25% prox-mid Cx, elevated LVEDP 33.  . Chronic diastolic CHF (congestive heart failure) ([place]) 03/31/2016   pt unaware, has family history  . Chronic lower back pain   . DDD (degenerative disc disease), lumbar   . Dyslipidemia   . Grade II diastolic dysfunction   . Hearing loss   . Heart murmur   . Heartburn    occ. takes TUMS  . Humeral fracture 07/2014   Right  . Kidney cysts    1.6 cm simple cyst right  . LVH (left ventricular hypertrophy)    a. severe basal septal  hypertrophy by echo 03/2016.  . Mitral regurgitation    a. mild by echo 03/2016.  . Osteopenia 03/13/2009  . Psoriasis   . Rheumatic fever    as a child  . Scoliosis 03/13/2009  . 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 07/2015, absent seizure  . Sinus bradycardia - baseline HR 50s at times.   . Sleep apnea    never had sleep study done, pt. unaware  . 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 Thrombosed hemorrhoids     Past Surgical History:  Procedure Laterality Date  . APPENDECTOMY    . BACK SURGERY    . CARDIAC CATHETERIZATION N/A 03/30/2016   Procedure: Right/Left Heart Cath and Coronary Angiography;  Surgeon: Jettie Booze, MD;  Location: [facility];  Service: Cardiovascular;  Laterality: N/A;  . CARDIAC CATHETERIZATION N/A 03/30/2016   Procedure: Coronary Stent Intervention;  Surgeon: Jettie Booze, MD;  Location: [facility];  Service: Cardiovascular;  Laterality: N/A;  . CATARACT EXTRACTION, BILATERAL    . COLONOSCOPY    . CORONARY ANGIOPLASTY    . FOREARM FRACTURE SURGERY Right ~ 2003-2004   "got steel  plate in there"  . HERNIA REPAIR  2009  . I&D hemorrhoirds    . KNEE ARTHROSCOPY Bilateral   . MOHS SURGERY Left    temple  . ORIF SCAPULAR FRACTURE Right    right  . POSTERIOR LUMBAR FUSION  2004  . TEE WITHOUT CARDIOVERSION N/A 11/01/2016   Procedure: TRANSESOPHAGEAL ECHOCARDIOGRAM (TEE);  Surgeon: Sanda Klein, MD;  Location: [facility];  Service: Cardiovascular;  Laterality: N/A;  . TEE WITHOUT CARDIOVERSION N/A 03/22/2017   Procedure: TRANSESOPHAGEAL ECHOCARDIOGRAM (TEE);  Surgeon: Sherren Mocha, MD;  Location: [facility];  Service: Open Heart Surgery;  Laterality: N/A;  . TONSILLECTOMY    . TOTAL KNEE ARTHROPLASTY Right 07/24/2018   Procedure: RIGHT TOTAL KNEE ARTHROPLASTY;  Surgeon: Gaynelle Arabian, MD;  Location: [facility];  Service: Orthopedics;  Laterality: Right;  14min   . TRANSCATHETER AORTIC VALVE REPLACEMENT, TRANSFEMORAL N/A 03/22/2017   Procedure: TRANSCATHETER AORTIC VALVE REPLACEMENT, TRANSFEMORAL;  Surgeon: Sherren Mocha, MD;  Location: [facility];  Service: Open Heart Surgery;  Laterality: N/A;    Current Medications: Current Meds  Medication Sig  . aspirin EC 81 MG tablet Take 81 mg by mouth daily.  . calcium carbonate (TUMS - DOSED IN MG ELEMENTAL CALCIUM) 500 MG chewable tablet Chew 1 tablet by mouth daily as needed for heartburn.   . clobetasol (TEMOVATE) 0.05 % external solution Apply 1 application topically 2 (two) times daily as needed (for psoriasis).  . clobetasol ointment (TEMOVATE) AB-123456789 % Apply 1 application topically 2 (two) times daily as needed (for psoriasis).  Marland Kitchen desonide (DESOWEN) 0.05 % ointment Apply 1 application topically daily as needed (psoriasis).   . diphenhydrAMINE (BENADRYL) 25 mg capsule Take 25 mg by mouth daily as needed for allergies.  Marland Kitchen ezetimibe (ZETIA) 10 MG tablet TAKE 1 TABLET(10 MG) BY MOUTH DAILY. Please keep upcoming appt in September with Dr. Radford Pax before anymore refills. Thank you  . HUMIRA PEN 40 MG/0.8ML PNKT Inject 40 mg into the skin See admin instructions. Inject 40 mg SQ every 5-6 weeks  . levETIRAcetam (KEPPRA) 750 MG tablet Take 1 tablet (750 mg total) by mouth 2 (two) times daily.  Marland Kitchen PROCTOZONE-HC 2.5 % rectal cream Apply 1 application topically daily as needed for hemorrhoids or itching.   . rosuvastatin (CRESTOR) 40 MG tablet TAKE 1 TABLET(40 MG) BY MOUTH DAILY  . shark liver oil-cocoa butter (PREPARATION H) 0.25-3-85.5 % suppository Place 1 suppository rectally as needed for hemorrhoids.  . terbinafine (LAMISIL) 250 MG tablet Take 250 mg by mouth as directed.     Allergies:   Patient has no known allergies.   Social History   Socioeconomic History  . Marital status: Married    Spouse name: Not on file  . Number of children: 1  . Years of education: Not on file  . Highest education level: Not on  file  Occupational History  . Not on file  Social Needs  . Financial resource strain: Not on file  . Food insecurity    Worry: Not on file    Inability: Not on file  . Transportation needs    Medical: Not on file    Non-medical: Not on file  Tobacco Use  . Smoking status: Never Smoker  . Smokeless tobacco: Never Used  Substance and Sexual Activity  . Alcohol use: Yes    Alcohol/week: 14.0 standard drinks    Types: 7 Shots of liquor, 7 Cans of beer per week  . Drug use: No  . Sexual activity: Not  Currently  Lifestyle  . Physical activity    Days per week: Not on file    Minutes per session: Not on file  . Stress: Not on file  Relationships  . Social Herbalist on phone: Not on file    Gets together: Not on file    Attends religious service: Not on file    Active member of club or organization: Not on file    Attends meetings of clubs or organizations: Not on file    Relationship status: Not on file  Other Topics Concern  . Not on file  Social History Narrative   Lives with wife and son in two story home      Right handed      Master's degree     Family History: The patient's family history includes Congestive Heart Failure in his father; Depression in his sister; Renal Disease in his mother.  ROS:   Please see the history of present illness.    ROS  All other systems reviewed and negative.   EKGs/Labs/Other Studies Reviewed:    The following studies were reviewed today: none  EKG:  EKG is not  ordered today.    Recent Labs: 07/26/2018: BUN 33; Creatinine, Ser 1.07; Hemoglobin 11.1; Platelets 112; Potassium 4.4; Sodium 140 03/21/2019: ALT 28   Recent Lipid Panel    Component Value Date/Time   CHOL 125 03/21/2019 0746   TRIG 67 03/21/2019 0746   HDL 52 03/21/2019 0746   CHOLHDL 2.4 03/21/2019 0746   CHOLHDL 2.6 08/06/2016 0831   VLDL 16 08/06/2016 0831   LDLCALC 60 03/21/2019 0746    Physical Exam:    VS:  BP (!) 156/80   Pulse 69    Ht 5\' 6"  (1.676 m)   Wt 188 lb 6.4 oz (85.5 kg)   BMI 30.41 kg/m     Wt Readings from Last 3 Encounters:  05/10/19 188 lb 6.4 oz (85.5 kg)  03/26/19 190 lb (86.2 kg)  08/23/18 181 lb (82.1 kg)     GEN:  Well nourished, well developed in no acute distress HEENT: Normal NECK: No JVD; No carotid bruits LYMPHATICS: No lymphadenopathy CARDIAC: RRR, no , rubs, gallops.  2/6 SM at LUSB to LLSB RESPIRATORY:  Clear to auscultation without rales, wheezing or rhonchi  ABDOMEN: Soft, non-tender, non-distended MUSCULOSKELETAL:  No edema; No deformity  SKIN: Warm and dry NEUROLOGIC:  Alert and oriented x 3 PSYCHIATRIC:  Normal affect   ASSESSMENT:    1. CAD S/P percutaneous coronary angioplasty   2. Severe aortic stenosis   3. Chronic diastolic heart failure ([place])   4. Benign essential HTN   5. Dyslipidemia   6. Ascending aortic aneurysm ([facility])    PLAN:    In order of problems listed above:  1.  ASCAD -s/p DES to RCA (03/2016)and 40% prox LAD and 50% ostial PDA -he has not had any anginal sx -continue ASA and statin.   2.  Severe AS -s/p TAVR (03/22/17) with a52mm Edwards Sapien 3 THV via the TF approach -doing well with no sx -echo 10/2018 showed stable TAVR with mean AVG 90mmHg and trivial perivavular AR  3.  Chronic diastolic CHF -he appears euvolemic on exam today -he has not required any diuretic therapy  4.  HTN -BP is elevated on exam today.   -he admits to eating hot dogs, processed meat and using table salt -we discussed that as people age their vessels become stiffer and Bp goes  up which is exacerbated by added salt in their diet -I have instructed him to follow a 2gm Na diet and avoid processed meats and table salt -if BP remains elevated may need to add BP meds  5.  HLD -LDL goal < 70.   -LDL was 60 last month -continue Zetial 10mg  daily and Crestor 40mg  daily  6.  Mild dilated ascending aorta -measured 73mm by echo 10/2018 -BP controlled -continue statin    Medication Adjustments/Labs and Tests Ordered: Current medicines are reviewed at length with the patient today.  Concerns regarding medicines are outlined above.  No orders of the defined types were placed in this encounter.  No orders of the defined types were placed in this encounter.   Signed, Fransico Him, MD  05/10/2019 10:42 AM    [place]

## 2019-05-10 ENCOUNTER — Ambulatory Visit (INDEPENDENT_AMBULATORY_CARE_PROVIDER_SITE_OTHER): Payer: Medicare Other | Admitting: Cardiology

## 2019-05-10 ENCOUNTER — Encounter: Payer: Self-pay | Admitting: Cardiology

## 2019-05-10 ENCOUNTER — Other Ambulatory Visit: Payer: Self-pay

## 2019-05-10 VITALS — BP 156/80 | HR 69 | Ht 66.0 in | Wt 188.4 lb

## 2019-05-10 DIAGNOSIS — I5032 Chronic diastolic (congestive) heart failure: Secondary | ICD-10-CM | POA: Diagnosis not present

## 2019-05-10 DIAGNOSIS — I10 Essential (primary) hypertension: Secondary | ICD-10-CM

## 2019-05-10 DIAGNOSIS — I350 Nonrheumatic aortic (valve) stenosis: Secondary | ICD-10-CM

## 2019-05-10 DIAGNOSIS — Z9861 Coronary angioplasty status: Secondary | ICD-10-CM

## 2019-05-10 DIAGNOSIS — I712 Thoracic aortic aneurysm, without rupture: Secondary | ICD-10-CM

## 2019-05-10 DIAGNOSIS — I2510 Atherosclerotic heart disease of native coronary artery without angina pectoris: Secondary | ICD-10-CM | POA: Diagnosis not present

## 2019-05-10 DIAGNOSIS — E785 Hyperlipidemia, unspecified: Secondary | ICD-10-CM

## 2019-05-10 DIAGNOSIS — I7121 Aneurysm of the ascending aorta, without rupture: Secondary | ICD-10-CM

## 2019-05-10 NOTE — Patient Instructions (Signed)
Medication Instructions:  Your physician recommends that you continue on your current medications as directed. Please refer to the Current Medication list given to you today.  If you need a refill on your cardiac medications before your next appointment, please call your pharmacy.   Lab work: None Ordered   Testing/Procedures: None Ordered   Follow-Up: At [facility], you and your health needs are our priority.  As part of our continuing mission to provide you with exceptional heart care, we have created designated Provider Care Teams.  These Care Teams include your primary Cardiologist (physician) and Advanced Practice Providers (APPs -  Physician Assistants and Nurse Practitioners) who all work together to provide you with the care you need, when you need it. You will need a follow up appointment in 1 years.  Please call our office 2 months in advance to schedule this appointment.  You may see Fransico Him, MD or one of the following Advanced Practice Providers on your designated Care Team:   Snyder, PA-C Melina Copa, PA-C . Ermalinda Barrios, PA-C   DASH Eating Plan DASH stands for "Dietary Approaches to Stop Hypertension." The DASH eating plan is a healthy eating plan that has been shown to reduce high blood pressure (hypertension). It may also reduce your risk for type 2 diabetes, heart disease, and stroke. The DASH eating plan may also help with weight loss. What are tips for following this plan?  General guidelines  Avoid eating more than 2,300 mg (milligrams) of salt (sodium) a day. If you have hypertension, you may need to reduce your sodium intake to 1,500 mg a day.  Limit alcohol intake to no more than 1 drink a day for nonpregnant women and 2 drinks a day for men. One drink equals 12 oz of beer, 5 oz of wine, or 1 oz of hard liquor.  Work with your health care provider to maintain a healthy body weight or to lose weight. Ask what an ideal weight is for you.  Get  at least 30 minutes of exercise that causes your heart to beat faster (aerobic exercise) most days of the week. Activities may include walking, swimming, or biking.  Work with your health care provider or diet and nutrition specialist (dietitian) to adjust your eating plan to your individual calorie needs. Reading food labels   Check food labels for the amount of sodium per serving. Choose foods with less than 5 percent of the Daily Value of sodium. Generally, foods with less than 300 mg of sodium per serving fit into this eating plan.  To find whole grains, look for the word "whole" as the first word in the ingredient list. Shopping  Buy products labeled as "low-sodium" or "no salt added."  Buy fresh foods. Avoid canned foods and premade or frozen meals. Cooking  Avoid adding salt when cooking. Use salt-free seasonings or herbs instead of table salt or sea salt. Check with your health care provider or pharmacist before using salt substitutes.  Do not fry foods. Cook foods using healthy methods such as baking, boiling, grilling, and broiling instead.  Cook with heart-healthy oils, such as olive, canola, soybean, or sunflower oil. Meal planning  Eat a balanced diet that includes: ? 5 or more servings of fruits and vegetables each day. At each meal, try to fill half of your plate with fruits and vegetables. ? Up to 6-8 servings of whole grains each day. ? Less than 6 oz of lean meat, poultry, or fish each day. A  3-oz serving of meat is about the same size as a deck of cards. One egg equals 1 oz. ? 2 servings of low-fat dairy each day. ? A serving of nuts, seeds, or beans 5 times each week. ? Heart-healthy fats. Healthy fats called Omega-3 fatty acids are found in foods such as flaxseeds and coldwater fish, like sardines, salmon, and mackerel.  Limit how much you eat of the following: ? Canned or prepackaged foods. ? Food that is high in trans fat, such as fried foods. ? Food that is  high in saturated fat, such as fatty meat. ? Sweets, desserts, sugary drinks, and other foods with added sugar. ? Full-fat dairy products.  Do not salt foods before eating.  Try to eat at least 2 vegetarian meals each week.  Eat more home-cooked food and less restaurant, buffet, and fast food.  When eating at a restaurant, ask that your food be prepared with less salt or no salt, if possible. What foods are recommended? The items listed may not be a complete list. Talk with your dietitian about what dietary choices are best for you. Grains Whole-grain or whole-wheat bread. Whole-grain or whole-wheat pasta. Brown rice. [place]. Bulgur. Whole-grain and low-sodium cereals. Pita bread. Low-fat, low-sodium crackers. Whole-wheat flour tortillas. Vegetables Fresh or frozen vegetables (raw, steamed, roasted, or grilled). Low-sodium or reduced-sodium tomato and vegetable juice. Low-sodium or reduced-sodium tomato sauce and tomato paste. Low-sodium or reduced-sodium canned vegetables. Fruits All fresh, dried, or frozen fruit. Canned fruit in natural juice (without added sugar). Meat and other protein foods Skinless chicken or [place]. Ground chicken or [place]. Pork with fat trimmed off. Fish and seafood. Egg whites. Dried beans, peas, or lentils. Unsalted nuts, nut butters, and seeds. Unsalted canned beans. Lean cuts of beef with fat trimmed off. Low-sodium, lean deli meat. Dairy Low-fat (1%) or fat-free (skim) milk. Fat-free, low-fat, or reduced-fat cheeses. Nonfat, low-sodium ricotta or cottage cheese. Low-fat or nonfat yogurt. Low-fat, low-sodium cheese. Fats and oils Soft margarine without trans fats. Vegetable oil. Low-fat, reduced-fat, or light mayonnaise and salad dressings (reduced-sodium). Canola, safflower, olive, soybean, and sunflower oils. Avocado. Seasoning and other foods Herbs. Spices. Seasoning mixes without salt. Unsalted popcorn and pretzels. Fat-free sweets. What foods are  not recommended? The items listed may not be a complete list. Talk with your dietitian about what dietary choices are best for you. Grains Baked goods made with fat, such as croissants, muffins, or some breads. Dry pasta or rice meal packs. Vegetables Creamed or fried vegetables. Vegetables in a cheese sauce. Regular canned vegetables (not low-sodium or reduced-sodium). Regular canned tomato sauce and paste (not low-sodium or reduced-sodium). Regular tomato and vegetable juice (not low-sodium or reduced-sodium). Angie Fava. Olives. Fruits Canned fruit in a light or heavy syrup. Fried fruit. Fruit in cream or butter sauce. Meat and other protein foods Fatty cuts of meat. Ribs. Fried meat. Berniece Salines. Sausage. Bologna and other processed lunch meats. Salami. Fatback. Hotdogs. Bratwurst. Salted nuts and seeds. Canned beans with added salt. Canned or smoked fish. Whole eggs or egg yolks. Chicken or [place] with skin. Dairy Whole or 2% milk, cream, and half-and-half. Whole or full-fat cream cheese. Whole-fat or sweetened yogurt. Full-fat cheese. Nondairy creamers. Whipped toppings. Processed cheese and cheese spreads. Fats and oils Butter. Stick margarine. Lard. Shortening. Ghee. Bacon fat. Tropical oils, such as coconut, palm kernel, or palm oil. Seasoning and other foods Salted popcorn and pretzels. Onion salt, garlic salt, seasoned salt, table salt, and sea salt. Worcestershire sauce. Tartar sauce.  Barbecue sauce. Teriyaki sauce. Soy sauce, including reduced-sodium. Steak sauce. Canned and packaged gravies. Fish sauce. Oyster sauce. Cocktail sauce. Horseradish that you find on the shelf. Ketchup. Mustard. Meat flavorings and tenderizers. Bouillon cubes. Hot sauce and Tabasco sauce. Premade or packaged marinades. Premade or packaged taco seasonings. Relishes. Regular salad dressings. Where to find more information:  National Heart, Lung, and [place]: https://wilson-eaton.com/  American Heart Association:  www.heart.org Summary  The DASH eating plan is a healthy eating plan that has been shown to reduce high blood pressure (hypertension). It may also reduce your risk for type 2 diabetes, heart disease, and stroke.  With the DASH eating plan, you should limit salt (sodium) intake to 2,300 mg a day. If you have hypertension, you may need to reduce your sodium intake to 1,500 mg a day.  When on the DASH eating plan, aim to eat more fresh fruits and vegetables, whole grains, lean proteins, low-fat dairy, and heart-healthy fats.  Work with your health care provider or diet and nutrition specialist (dietitian) to adjust your eating plan to your individual calorie needs. This information is not intended to replace advice given to you by your health care provider. Make sure you discuss any questions you have with your health care provider. Document Released: 07/22/2011 Document Revised: 07/15/2017 Document Reviewed: 07/26/2016 Elsevier Patient Education  2020 [employer].

## 2019-06-04 NOTE — Patient Instructions (Addendum)
DUE TO COVID-19 ONLY ONE VISITOR IS ALLOWED TO COME WITH YOU AND STAY IN THE WAITING ROOM ONLY DURING PRE OP AND PROCEDURE DAY OF SURGERY. THE 1 VISITOR MAY VISIT WITH YOU AFTER SURGERY IN YOUR PRIVATE ROOM DURING VISITING HOURS ONLY!  YOU NEED TO HAVE A COVID 19 TEST ON_  THURSDAY 10/22/2020______ @___250  pm____, THIS TEST MUST BE DONE BEFORE SURGERY, COME  [address], [address] , [address].  ([name]) ONCE YOUR COVID TEST IS COMPLETED, PLEASE BEGIN THE QUARANTINE INSTRUCTIONS AS OUTLINED IN YOUR HANDOUT.                [address]    Your procedure is scheduled on: Monday 06/11/2019   Report to [facility] Main  Entrance    Report to admitting at  [phone]  AM     Call this number if you have problems the morning of surgery [phone]    Remember: Do not eat food  :After Midnight.     NO SOLID FOOD AFTER MIDNIGHT THE NIGHT PRIOR TO SURGERY. NOTHING BY MOUTH EXCEPT CLEAR LIQUIDS UNTIL  0625 AM .     PLEASE FINISH ENSURE DRINK PER SURGEON ORDER  WHICH NEEDS TO BE COMPLETED AT  0625 AM .   CLEAR LIQUID DIET   Foods Allowed                                                                     Foods Excluded  Coffee and tea, regular and decaf                             liquids that you cannot  Plain Jell-O any favor except red or purple                                           see through such as: Fruit ices (not with fruit pulp)                                     milk, soups, orange juice  Iced Popsicles                                    All solid food Carbonated beverages, regular and diet                                    Cranberry, grape and apple juices Sports drinks like Gatorade Lightly seasoned clear broth or consume(fat free) Sugar, honey syrup  Sample Menu Breakfast                                Lunch  Supper Cranberry juice                    Beef broth                            Chicken  broth Jell-O                                     Grape juice                           Apple juice Coffee or tea                        Jell-O                                      Popsicle                                                Coffee or tea                        Coffee or tea  _____________________________________________________________________     BRUSH YOUR TEETH MORNING OF SURGERY AND RINSE YOUR MOUTH OUT, NO CHEWING GUM CANDY OR MINTS.     Take these medicines the morning of surgery with A SIP OF WATER: Levetiracetam  Keppra)                                You may not have any metal on your body including hair pins and              piercings  Do not wear jewelry, make-up, lotions, powders or perfumes, deodorant              Men may shave face and neck.   Do not bring valuables to the hospital. [place].  Contacts, dentures or bridgework may not be worn into surgery.  Leave suitcase in the car. After surgery it may be brought to your room.                Please read over the following fact sheets you were given: _____________________________________________________________________             [facility] - Preparing for Surgery Before surgery, you can play an important role.  Because skin is not sterile, your skin needs to be as free of germs as possible.  You can reduce the number of germs on your skin by washing with CHG (chlorahexidine gluconate) soap before surgery.  CHG is an antiseptic cleaner which kills germs and bonds with the skin to continue killing germs even after washing. Please DO NOT use if you have an allergy to CHG or antibacterial soaps.  If your skin becomes reddened/irritated stop using the CHG and inform your nurse when you arrive at Short Stay. Do not shave (including legs and underarms) for  at least 48 hours prior to the first CHG shower.  You may shave your face/neck. Please follow these  instructions carefully:  1.  Shower with CHG Soap the night before surgery and the  morning of Surgery.  2.  If you choose to wash your hair, wash your hair first as usual with your  normal  shampoo.  3.  After you shampoo, rinse your hair and body thoroughly to remove the  shampoo.                           4.  Use CHG as you would any other liquid soap.  You can apply chg directly  to the skin and wash                       Gently with a scrungie or clean washcloth.  5.  Apply the CHG Soap to your body ONLY FROM THE NECK DOWN.   Do not use on face/ open                           Wound or open sores. Avoid contact with eyes, ears mouth and genitals (private parts).                       Wash face,  Genitals (private parts) with your normal soap.             6.  Wash thoroughly, paying special attention to the area where your surgery  will be performed.  7.  Thoroughly rinse your body with warm water from the neck down.  8.  DO NOT shower/wash with your normal soap after using and rinsing off  the CHG Soap.                9.  Pat yourself dry with a clean towel.            10.  Wear clean pajamas.            11.  Place clean sheets on your bed the night of your first shower and do not  sleep with pets. Day of Surgery : Do not apply any lotions/deodorants the morning of surgery.  Please wear clean clothes to the hospital/surgery center.  FAILURE TO FOLLOW THESE INSTRUCTIONS MAY RESULT IN THE CANCELLATION OF YOUR SURGERY PATIENT SIGNATURE_________________________________  NURSE SIGNATURE__________________________________  ________________________________________________________________________   [address]  An incentive spirometer is a tool that can help keep your lungs clear and active. This tool measures how well you are filling your lungs with each breath. Taking long deep breaths may help reverse or decrease the chance of developing breathing (pulmonary) problems (especially  infection) following:  A long period of time when you are unable to move or be active. BEFORE THE PROCEDURE   If the spirometer includes an indicator to show your best effort, your nurse or respiratory therapist will set it to a desired goal.  If possible, sit up straight or lean slightly forward. Try not to slouch.  Hold the incentive spirometer in an upright position. INSTRUCTIONS FOR USE  1. Sit on the edge of your bed if possible, or sit up as far as you can in bed or on a chair. 2. Hold the incentive spirometer in an upright position. 3. Breathe out normally. 4. Place the mouthpiece in your mouth and seal your lips tightly  around it. 5. Breathe in slowly and as deeply as possible, raising the piston or the ball toward the top of the column. 6. Hold your breath for 3-5 seconds or for as long as possible. Allow the piston or ball to fall to the bottom of the column. 7. Remove the mouthpiece from your mouth and breathe out normally. 8. Rest for a few seconds and repeat Steps 1 through 7 at least 10 times every 1-2 hours when you are awake. Take your time and take a few normal breaths between deep breaths. 9. The spirometer may include an indicator to show your best effort. Use the indicator as a goal to work toward during each repetition. 10. After each set of 10 deep breaths, practice coughing to be sure your lungs are clear. If you have an incision (the cut made at the time of surgery), support your incision when coughing by placing a pillow or rolled up towels firmly against it. Once you are able to get out of bed, walk around indoors and cough well. You may stop using the incentive spirometer when instructed by your caregiver.  RISKS AND COMPLICATIONS  Take your time so you do not get dizzy or light-headed.  If you are in pain, you may need to take or ask for pain medication before doing incentive spirometry. It is harder to take a deep breath if you are having pain. AFTER  USE  Rest and breathe slowly and easily.  It can be helpful to keep track of a log of your progress. Your caregiver can provide you with a simple table to help with this. If you are using the spirometer at home, follow these instructions: [address] IF:   You are having difficultly using the spirometer.  You have trouble using the spirometer as often as instructed.  Your pain medication is not giving enough relief while using the spirometer.  You develop fever of 100.5 F (38.1 C) or higher. SEEK IMMEDIATE MEDICAL CARE IF:   You cough up bloody sputum that had not been present before.  You develop fever of 102 F (38.9 C) or greater.  You develop worsening pain at or near the incision site. MAKE SURE YOU:   Understand these instructions.  Will watch your condition.  Will get help right away if you are not doing well or get worse. Document Released: 12/13/2006 Document Revised: 10/25/2011 Document Reviewed: 02/13/2007 ExitCare Patient Information 2014 ExitCare, [place].   ________________________________________________________________________  WHAT IS A BLOOD TRANSFUSION? Blood Transfusion Information  A transfusion is the replacement of blood or some of its parts. Blood is made up of multiple cells which provide different functions.  Red blood cells carry oxygen and are used for blood loss replacement.  White blood cells fight against infection.  Platelets control bleeding.  Plasma helps clot blood.  Other blood products are available for specialized needs, such as hemophilia or other clotting disorders. BEFORE THE TRANSFUSION  Who gives blood for transfusions?   Healthy volunteers who are fully evaluated to make sure their blood is safe. This is blood bank blood. Transfusion therapy is the safest it has ever been in the practice of medicine. Before blood is taken from a donor, a complete history is taken to make sure that person has no history of diseases  nor engages in risky social behavior (examples are intravenous drug use or sexual activity with multiple partners). The donor's travel history is screened to minimize risk of transmitting infections, such as malaria. The  donated blood is tested for signs of infectious diseases, such as HIV and hepatitis. The blood is then tested to be sure it is compatible with you in order to minimize the chance of a transfusion reaction. If you or a relative donates blood, this is often done in anticipation of surgery and is not appropriate for emergency situations. It takes many days to process the donated blood. RISKS AND COMPLICATIONS Although transfusion therapy is very safe and saves many lives, the main dangers of transfusion include:   Getting an infectious disease.  Developing a transfusion reaction. This is an allergic reaction to something in the blood you were given. Every precaution is taken to prevent this. The decision to have a blood transfusion has been considered carefully by your caregiver before blood is given. Blood is not given unless the benefits outweigh the risks. AFTER THE TRANSFUSION  Right after receiving a blood transfusion, you will usually feel much better and more energetic. This is especially true if your red blood cells have gotten low (anemic). The transfusion raises the level of the red blood cells which carry oxygen, and this usually causes an energy increase.  The nurse administering the transfusion will monitor you carefully for complications. HOME CARE INSTRUCTIONS  No special instructions are needed after a transfusion. You may find your energy is better. Speak with your caregiver about any limitations on activity for underlying diseases you may have. SEEK MEDICAL CARE IF:   Your condition is not improving after your transfusion.  You develop redness or irritation at the intravenous (IV) site. SEEK IMMEDIATE MEDICAL CARE IF:  Any of the following symptoms occur over the  next 12 hours:  Shaking chills.  You have a temperature by mouth above 102 F (38.9 C), not controlled by medicine.  Chest, back, or muscle pain.  People around you feel you are not acting correctly or are confused.  Shortness of breath or difficulty breathing.  Dizziness and fainting.  You get a rash or develop hives.  You have a decrease in urine output.  Your urine turns a dark color or changes to pink, red, or brown. Any of the following symptoms occur over the next 10 days:  You have a temperature by mouth above 102 F (38.9 C), not controlled by medicine.  Shortness of breath.  Weakness after normal activity.  The white part of the eye turns yellow (jaundice).  You have a decrease in the amount of urine or are urinating less often.  Your urine turns a dark color or changes to pink, red, or brown. Document Released: 07/30/2000 Document Revised: 10/25/2011 Document Reviewed: 03/18/2008 [facility] Patient Information 2014 [place], [place].  _______________________________________________________________________

## 2019-06-06 ENCOUNTER — Other Ambulatory Visit: Payer: Self-pay

## 2019-06-06 ENCOUNTER — Encounter (HOSPITAL_COMMUNITY): Payer: Self-pay

## 2019-06-06 ENCOUNTER — Encounter (HOSPITAL_COMMUNITY)
Admission: RE | Admit: 2019-06-06 | Discharge: 2019-06-06 | Disposition: A | Discharge status: Home / Self Care | Payer: Medicare Other | Source: Ambulatory Visit | Attending: Orthopedic Surgery | Admitting: Orthopedic Surgery

## 2019-06-06 DIAGNOSIS — Z79899 Other long term (current) drug therapy: Secondary | ICD-10-CM | POA: Insufficient documentation

## 2019-06-06 DIAGNOSIS — I2510 Atherosclerotic heart disease of native coronary artery without angina pectoris: Secondary | ICD-10-CM | POA: Diagnosis not present

## 2019-06-06 DIAGNOSIS — Z7982 Long term (current) use of aspirin: Secondary | ICD-10-CM | POA: Diagnosis not present

## 2019-06-06 DIAGNOSIS — I5032 Chronic diastolic (congestive) heart failure: Secondary | ICD-10-CM | POA: Diagnosis not present

## 2019-06-06 DIAGNOSIS — I110 Hypertensive heart disease with heart failure: Secondary | ICD-10-CM | POA: Insufficient documentation

## 2019-06-06 DIAGNOSIS — Z01818 Encounter for other preprocedural examination: Secondary | ICD-10-CM | POA: Insufficient documentation

## 2019-06-06 DIAGNOSIS — E785 Hyperlipidemia, unspecified: Secondary | ICD-10-CM | POA: Diagnosis not present

## 2019-06-06 LAB — COMPREHENSIVE METABOLIC PANEL
ALT: 37 U/L (ref 0–44)
AST: 41 U/L (ref 15–41)
Albumin: 4.1 g/dL (ref 3.5–5.0)
Alkaline Phosphatase: 75 U/L (ref 38–126)
Anion gap: 8 (ref 5–15)
BUN: 16 mg/dL (ref 8–23)
CO2: 27 mmol/L (ref 22–32)
Calcium: 9.1 mg/dL (ref 8.9–10.3)
Chloride: 103 mmol/L (ref 98–111)
Creatinine, Ser: 0.8 mg/dL (ref 0.61–1.24)
GFR calc Af Amer: >60 mL/min (ref >60)
GFR calc non Af Amer: >60 mL/min (ref >60)
Glucose, Bld: 98 mg/dL (ref 70–99)
Potassium: 4.5 mmol/L (ref 3.5–5.1)
Sodium: 138 mmol/L (ref 135–145)
Total Bilirubin: 1.6 mg/dL — ABNORMAL HIGH (ref 0.3–1.2)
Total Protein: 6.7 g/dL (ref 6.5–8.1)

## 2019-06-06 LAB — CBC
HCT: 47.7 % (ref 39.0–52.0)
Hemoglobin: 15.1 g/dL (ref 13.0–17.0)
MCH: 29.6 pg (ref 26.0–34.0)
MCHC: 31.7 g/dL (ref 30.0–36.0)
MCV: 93.5 fL (ref 80.0–100.0)
Platelets: 189 10*3/uL (ref 150–400)
RBC: 5.1 MIL/uL (ref 4.22–5.81)
RDW: 14.1 % (ref 11.5–15.5)
WBC: 5.7 10*3/uL (ref 4.0–10.5)
nRBC: 0 % (ref 0.0–0.2)

## 2019-06-06 LAB — APTT: aPTT: 30 seconds (ref 24–36)

## 2019-06-06 LAB — PROTIME-INR
INR: 1 (ref 0.8–1.2)
Prothrombin Time: 12.9 seconds (ref 11.4–15.2)

## 2019-06-06 LAB — SURGICAL PCR SCREEN
MRSA, PCR: NEGATIVE
Staphylococcus aureus: NEGATIVE

## 2019-06-06 NOTE — Progress Notes (Signed)
PCP - Dr. Lawerance Cruel  Medical Clearance on chart from 02/28/2019 Cardiologist - Dr. Fransico Him  LOV- 05/10/2019   Per conversation note 03/02/2019-Luke Dyann Kief stated that Dr. Fransico Him will address clearance at August appointment .  Clearance not addressed!!  Chest x-ray - none EKG - 06/06/2019 Stress Test -none ECHO - 10/26/2018 Cardiac Cath - 03/30/2016- cardiac stent placed by Dr. Irish Lack  Sleep Study - none CPAP - none  Fasting Blood Sugar - none Checks Blood Sugar _____ times a day  Blood Thinner Instructions:none  Aspirin Instructions:Medical Clearance on chart from Dr. Lawerance Cruel addresses Aspirin 81 mg and instructed patient to stop 7 days prior to surgery and resume 1-2 days after surgery. Patient states he did not get any instructions as to when to stop the Aspirin from Dr. Lawerance Cruel . Patient states he will stop Aspirin today as he took Aspirin this morning and Dr. Harrington Challenger never gave him instructions.  Last Dose:06/06/2019  Anesthesia review:  Chart reviewed by Myra Gianotti, PA or Val Eagle.  Patient has a history of Psoriasis , severe Aortic Stenosis with S/P TAVR on 03/22/2017, heart murmur, Rheumatic fever as a child, CHF, CAD S?P cardiac stent, iodiopathic seizures  Patient denies shortness of breath, fever, cough and chest pain at PAT appointment   Patient verbalized understanding of instructions that were given to them at the PAT appointment. Patient was also instructed that they will need to review over the PAT instructions again at home before surgery.

## 2019-06-07 ENCOUNTER — Other Ambulatory Visit (HOSPITAL_COMMUNITY)
Admission: RE | Admit: 2019-06-07 | Discharge: 2019-06-07 | Disposition: A | Discharge status: Home / Self Care | Payer: Medicare Other | Source: Ambulatory Visit | Attending: Orthopedic Surgery | Admitting: Orthopedic Surgery

## 2019-06-07 ENCOUNTER — Telehealth: Payer: Self-pay | Admitting: *Deleted

## 2019-06-07 DIAGNOSIS — Z01812 Encounter for preprocedural laboratory examination: Secondary | ICD-10-CM | POA: Diagnosis present

## 2019-06-07 DIAGNOSIS — Z20828 Contact with and (suspected) exposure to other viral communicable diseases: Secondary | ICD-10-CM | POA: Insufficient documentation

## 2019-06-07 NOTE — Telephone Encounter (Signed)
   [facility] Pre-operative Risk Assessment    Request for surgical clearance:  1. What type of surgery is being performed? LEFT TOTAL KNEE ARTHOPLASTY    2. When is this surgery scheduled? 06/11/19   3. What type of clearance is required (medical clearance vs. Pharmacy clearance to hold med vs. Both)? MEDICAL  4. Are there any medications that need to be held prior to surgery and how long? ASA    5. Practice name and name of physician performing surgery? [facility]; DR. FRANK ALUISIO   6. What is your office phone number [phone]    7.   What is your office fax number [phone]  8.   Anesthesia type (None, local, MAC, general) ? CHOICE   Julaine Hua 06/07/2019, 11:08 AM  _________________________________________________________________   (provider comments below)

## 2019-06-07 NOTE — Telephone Encounter (Signed)
   Primary Cardiologist: Fransico Him, MD  Chart reviewed as part of pre-operative protocol coverage. Given past medical history and time since last visit, based on ACC/AHA guidelines, Douglas Lane would be at acceptable risk for the planned procedure without further cardiovascular testing.   He has a hx of HLD, CAD s/p DES to RCA (03/2016)and 40% prox LAD and 50% ostial PDA,HTN, chronic diastolic CHF and severe AS s/p TAVR (03/22/17) with a5mm Edwards Sapien 3 THV via the TF approach.Echo post TAVR showed normal LVF with G2DD with stable TAVR with AVA 2cm2, mean AVG 2mmHg and peak AVG 66mmHg.  He was last seen by Dr. Radford Pax 05/10/2019 in which he was doing well from a cardiac perspective.  Per Dr. Radford Pax it is acceptable to hold ASA prior to procedure and resume when ok from a surgery standpoint.   I will route this recommendation to the requesting party via Epic fax function and remove from pre-op pool.  Please call with questions.  Kathyrn Drown, NP 06/07/2019, 4:11 PM

## 2019-06-07 NOTE — Telephone Encounter (Signed)
Dr. Radford Pax  Can you please comment on holding this patient's ASA therapy?  He is for a left total knee arthroplasty 06/11/2019.  Surgical team is asking for ASA holding recommendations.  He has a hx of HLD, CAD s/p DES to RCA (03/2016)and 40% prox LAD and 50% ostial PDA,HTN, chronic diastolic CHF and severe AS s/p TAVR (03/22/17) with a19mm Edwards Sapien 3 THV via the TF approach.Echo post TAVR showed normal LVF with G2DD with stable TAVR with AVA 2cm2, mean AVG 66mmHg and peak AVG 83mmHg.  You last saw him in follow-up 05/10/2019 in which he was doing well from a cardiac perspective.  Please send your recommendations to the preop pool  Thank you Sharee Pimple

## 2019-06-07 NOTE — Telephone Encounter (Signed)
Ok to hold ASA 

## 2019-06-10 LAB — NOVEL CORONAVIRUS, NAA (HOSP ORDER, SEND-OUT TO REF LAB; TAT 18-24 HRS): SARS-CoV-2, NAA: NOT DETECTED

## 2019-06-10 MED ORDER — BUPIVACAINE LIPOSOME 1.3 % IJ SUSP
20.0000 mL | Freq: Once | INTRAMUSCULAR | Status: DC
  Filled 2019-06-10: qty 20

## 2019-06-10 NOTE — H&P (Signed)
TOTAL KNEE ADMISSION H&P  Patient is being admitted for left total knee arthroplasty.  Subjective:  Chief Complaint:left knee pain.  HPI: Douglas Lane, 73 y.o. male, has a history of pain and functional disability in the left knee due to arthritis and has failed non-surgical conservative treatments for greater than 12 weeks to includeNSAID's and/or analgesics, corticosteriod injections, viscosupplementation injections and activity modification.  Onset of symptoms was gradual, starting >10 years ago with rapidlly worsening course since that time. The patient noted prior procedures on the knee to include  arthroscopy and menisectomy on the left knee(s).  Patient currently rates pain in the left knee(s) at 7 out of 10 with activity. Patient has night pain, worsening of pain with activity and weight bearing, pain that interferes with activities of daily living, pain with passive range of motion, crepitus and joint swelling.  Patient has evidence of periarticular osteophytes and joint space narrowing by imaging studies. There is no active infection.  Patient Active Problem List   Diagnosis Date Noted  . OA (osteoarthritis) of knee 07/24/2018  . Chronic diastolic heart failure ([place]) 11/13/2017  . Benign essential HTN 11/13/2017  . S/P TAVR (transcatheter aortic valve replacement) 03/22/2017  . Severe aortic stenosis 03/22/2017  . Bradycardia, drug induced 10/28/2016  . Dyslipidemia 03/31/2016  . Mitral regurgitation 03/31/2016  . CAD S/P percutaneous coronary angioplasty 03/31/2016  . Abnormal nuclear stress test   . Heart murmur 03/06/2015  . Abnormal EKG 03/06/2015  . LVH (left ventricular hypertrophy) 03/06/2015  . Rheumatic fever   . Localization-related idiopathic epilepsy and epileptic syndromes with seizures of localized onset, not intractable, without status epilepticus ([place]) 08/23/2014  . Hemorrhoid, thrombosed. 12/24/2011   Past Medical History:  Diagnosis Date  . Aortic  atherosclerosis ([place]) 03/08/2017   CT  . Aortic stenosis    severe AS s/p TAVR (03/22/17) with a 83mm Edwards Sapien 3 THV via the TF approach  . Arthritis    "pain in my knees" (03/30/2016)  . Bilateral cataracts   . CAD S/P percutaneous coronary angioplasty 03/31/2016   a. CP/USA -> LHC 03/31/16: 90% prox-mid RCA s/p DES, 40% prox-mid LAD, 25% D2, 25% prox-mid Cx, elevated LVEDP 33.  . Chronic diastolic CHF (congestive heart failure) ([place]) 03/31/2016   pt unaware, has family history  . Chronic lower back pain   . DDD (degenerative disc disease), lumbar   . Dyslipidemia   . Grade II diastolic dysfunction   . Hearing loss   . Heart murmur   . Heartburn    occ. takes TUMS  . Humeral fracture 07/2014   Right  . Kidney cysts    1.6 cm simple cyst right  . LVH (left ventricular hypertrophy)    a. severe basal septal hypertrophy by echo 03/2016.  . Mitral regurgitation    a. mild by echo 03/2016.  . Osteopenia 03/13/2009  . Psoriasis   . Rheumatic fever    as a child  . Scoliosis 03/13/2009  . Seizure ([place]) 01/2011; 07/2011; 07/2015   "idiopathic; been on Kepra since 07/2011", last seizure poss. 07/2015, absent seizure  . Sinus bradycardia - baseline HR 50s at times.   . 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 Thrombosed hemorrhoids     Past Surgical History:  Procedure Laterality Date  . APPENDECTOMY    . BACK SURGERY    . CARDIAC CATHETERIZATION N/A 03/30/2016   Procedure: Right/Left Heart Cath and Coronary Angiography;  Surgeon:  Jettie Booze, MD;  Location: [facility];  Service: Cardiovascular;  Laterality: N/A;  . CARDIAC CATHETERIZATION N/A 03/30/2016   Procedure: Coronary Stent Intervention;  Surgeon: Jettie Booze, MD;  Location: [facility];  Service: Cardiovascular;  Laterality: N/A;  . CATARACT EXTRACTION, BILATERAL    . COLONOSCOPY    . CORONARY ANGIOPLASTY    . FOREARM FRACTURE SURGERY Right ~ 2003-2004   "got steel  plate in there"  . HERNIA REPAIR  2009  . I&D hemorrhoirds    . KNEE ARTHROSCOPY Bilateral   . MOHS SURGERY Left    temple  . ORIF SCAPULAR FRACTURE Right    right  . POSTERIOR LUMBAR FUSION  2004  . TEE WITHOUT CARDIOVERSION N/A 11/01/2016   Procedure: TRANSESOPHAGEAL ECHOCARDIOGRAM (TEE);  Surgeon: Sanda Klein, MD;  Location: [facility];  Service: Cardiovascular;  Laterality: N/A;  . TEE WITHOUT CARDIOVERSION N/A 03/22/2017   Procedure: TRANSESOPHAGEAL ECHOCARDIOGRAM (TEE);  Surgeon: Sherren Mocha, MD;  Location: [facility];  Service: Open Heart Surgery;  Laterality: N/A;  . TONSILLECTOMY    . TOTAL KNEE ARTHROPLASTY Right 07/24/2018   Procedure: RIGHT TOTAL KNEE ARTHROPLASTY;  Surgeon: Gaynelle Arabian, MD;  Location: [facility];  Service: Orthopedics;  Laterality: Right;  75min  . TRANSCATHETER AORTIC VALVE REPLACEMENT, TRANSFEMORAL N/A 03/22/2017   Procedure: TRANSCATHETER AORTIC VALVE REPLACEMENT, TRANSFEMORAL;  Surgeon: Sherren Mocha, MD;  Location: [facility];  Service: Open Heart Surgery;  Laterality: N/A;    Current Facility-Administered Medications  Medication Dose Route Frequency Provider Last Rate Last Dose  . [START ON 06/11/2019] bupivacaine liposome (EXPAREL) 1.3 % injection 266 mg  20 mL Other Once Gaynelle Arabian, MD       Current Outpatient Medications  Medication Sig Dispense Refill Last Dose  . aspirin EC 81 MG tablet Take 81 mg by mouth daily.     . calcium carbonate (TUMS - DOSED IN MG ELEMENTAL CALCIUM) 500 MG chewable tablet Chew 1 tablet by mouth daily as needed for heartburn.      . clobetasol (TEMOVATE) 0.05 % external solution Apply 1 application topically 2 (two) times daily as needed (for psoriasis).     . clobetasol ointment (TEMOVATE) AB-123456789 % Apply 1 application topically 2 (two) times daily as needed (for psoriasis).     Marland Kitchen desonide (DESOWEN) 0.05 % ointment Apply 1 application topically daily as needed (psoriasis).      . diphenhydrAMINE (BENADRYL) 25 mg capsule Take  25 mg by mouth daily as needed for allergies.     Marland Kitchen ezetimibe (ZETIA) 10 MG tablet TAKE 1 TABLET(10 MG) BY MOUTH DAILY. Please keep upcoming appt in September with Dr. Radford Pax before anymore refills. Thank you 90 tablet 0   . HUMIRA PEN 40 MG/0.8ML PNKT Inject 40 mg into the skin See admin instructions. Inject 40 mg SQ every 6 weeks     . levETIRAcetam (KEPPRA) 750 MG tablet Take 1 tablet (750 mg total) by mouth 2 (two) times daily. 180 tablet 3   . PROCTOZONE-HC 2.5 % rectal cream Apply 1 application topically daily as needed for hemorrhoids or itching.      . rosuvastatin (CRESTOR) 40 MG tablet TAKE 1 TABLET(40 MG) BY MOUTH DAILY (Patient taking differently: Take 40 mg by mouth at bedtime. ) 90 tablet 3   . shark liver oil-cocoa butter (PREPARATION H) 0.25-3-85.5 % suppository Place 1 suppository rectally as needed for hemorrhoids.     . terbinafine (LAMISIL) 250 MG tablet Take 250 mg by  mouth See admin instructions. Takes 1  Week out of each month     . nitroGLYCERIN (NITROSTAT) 0.4 MG SL tablet DISSOLVE 1 TABLET UNDER THE TONGUE EVERY 5 MINUTES AS NEEDED FOR CHEST PAIN( UP TO 3 DOSES) (Patient not taking: Reported on 05/31/2019) 25 tablet 5 Not Taking at Unknown time   No Known Allergies  Social History   Tobacco Use  . Smoking status: Never Smoker  . Smokeless tobacco: Never Used  Substance Use Topics  . Alcohol use: Yes    Alcohol/week: 2.0 standard drinks    Types: 1 Cans of beer, 1 Shots of liquor per week    Comment: daily    Family History  Problem Relation Age of Onset  . Renal Disease Mother   . Congestive Heart Failure Father   . Depression Sister      Review of Systems  Constitutional: Negative.   HENT: Negative.   Eyes: Negative.   Respiratory: Negative.   Cardiovascular: Negative.   Gastrointestinal: Positive for heartburn. Negative for abdominal pain, blood in stool, constipation, diarrhea, melena, nausea and vomiting.  Genitourinary: Negative.   Musculoskeletal:  Positive for back pain, joint pain and myalgias. Negative for falls and neck pain.  Skin: Negative.   Neurological: Negative.   Endo/Heme/Allergies: Negative.   Psychiatric/Behavioral: Negative.     Objective:  Physical Exam  Constitutional: He is oriented to person, place, and time. He appears well-developed. No distress.  Obese  HENT:  Head: Normocephalic and atraumatic.  Right Ear: External ear normal.  Left Ear: External ear normal.  Nose: Nose normal.  Mouth/Throat: Oropharynx is clear and moist.  Eyes: Conjunctivae and EOM are normal.  Neck: Normal range of motion. Neck supple.  Cardiovascular: Normal rate, regular rhythm and intact distal pulses.  Murmur heard. Respiratory: Effort normal and breath sounds normal. No respiratory distress. He has no wheezes.  GI: Soft. Bowel sounds are normal. He exhibits no distension. There is no abdominal tenderness.  Musculoskeletal:     Right hip: Normal.     Left hip: Normal.     Right knee: Normal.     Left knee: He exhibits decreased range of motion and swelling. He exhibits no effusion and no erythema. Tenderness found. Medial joint line tenderness noted. No lateral joint line tenderness noted.  Neurological: He is alert and oriented to person, place, and time.  Skin: No rash noted. He is not diaphoretic. No erythema.  Psychiatric: He has a normal mood and affect. His behavior is normal.    Vitals BP: 136/84 HR: 72 bpm Wt: 86.2 kg Ht: 5'6  Imaging Review Plain radiographs demonstrate severe degenerative joint disease of the left knee(s). The overall alignment ismild varus. The bone quality appears to be good for age and reported activity level.      Assessment/Plan:  End stage primary osteoarthritis, left knee   The patient history, physical examination, clinical judgment of the provider and imaging studies are consistent with end stage degenerative joint disease of the left knee(s) and total knee arthroplasty is  deemed medically necessary. The treatment options including medical management, injection therapy arthroscopy and arthroplasty were discussed at length. The risks and benefits of total knee arthroplasty were presented and reviewed. The risks due to aseptic loosening, infection, stiffness, patella tracking problems, thromboembolic complications and other imponderables were discussed. The patient acknowledged the explanation, agreed to proceed with the plan and consent was signed. Patient is being admitted for inpatient treatment for surgery, pain control, PT, OT, prophylactic antibiotics,  VTE prophylaxis, progressive ambulation and ADL's and discharge planning. The patient is planning to be discharged home.    Anticipated LOS equal to or greater than 2 midnights due to - Age 56 and older with one or more of the following:  - Obesity  - Expected need for hospital services (PT, OT, Nursing) required for safe  discharge  - Anticipated need for postoperative skilled nursing care or inpatient rehab  - Active co-morbidities: Coronary Artery Disease and Heart Failure OR   - Unanticipated findings during/Post Surgery: None  - Patient is a high risk of re-admission due to: None     Therapy Plan: outpatient therapy at [facility] Disposition: Home with wife PCP: Dr. Harrington Challenger Cardio: Dr. Allegra Lai, PA-C

## 2019-06-11 ENCOUNTER — Encounter (HOSPITAL_COMMUNITY)
Admission: RE | Disposition: A | Discharge status: Home / Self Care | Payer: Self-pay | Source: Home / Self Care | Attending: Orthopedic Surgery

## 2019-06-11 ENCOUNTER — Encounter (HOSPITAL_COMMUNITY): Payer: Self-pay | Admitting: Emergency Medicine

## 2019-06-11 ENCOUNTER — Ambulatory Visit (HOSPITAL_COMMUNITY): Payer: Medicare Other | Admitting: Physician Assistant

## 2019-06-11 ENCOUNTER — Other Ambulatory Visit: Payer: Self-pay

## 2019-06-11 ENCOUNTER — Ambulatory Visit (HOSPITAL_COMMUNITY): Payer: Medicare Other

## 2019-06-11 ENCOUNTER — Ambulatory Visit (HOSPITAL_COMMUNITY)
Admission: RE | Admit: 2019-06-11 | Discharge: 2019-06-12 | Disposition: A | Discharge status: Home / Self Care | Payer: Medicare Other | Attending: Orthopedic Surgery | Admitting: Orthopedic Surgery

## 2019-06-11 DIAGNOSIS — Z952 Presence of prosthetic heart valve: Secondary | ICD-10-CM | POA: Insufficient documentation

## 2019-06-11 DIAGNOSIS — Z955 Presence of coronary angioplasty implant and graft: Secondary | ICD-10-CM | POA: Insufficient documentation

## 2019-06-11 DIAGNOSIS — Z7982 Long term (current) use of aspirin: Secondary | ICD-10-CM | POA: Insufficient documentation

## 2019-06-11 DIAGNOSIS — Z96651 Presence of right artificial knee joint: Secondary | ICD-10-CM | POA: Insufficient documentation

## 2019-06-11 DIAGNOSIS — I2510 Atherosclerotic heart disease of native coronary artery without angina pectoris: Secondary | ICD-10-CM | POA: Insufficient documentation

## 2019-06-11 DIAGNOSIS — R569 Unspecified convulsions: Secondary | ICD-10-CM | POA: Diagnosis not present

## 2019-06-11 DIAGNOSIS — E785 Hyperlipidemia, unspecified: Secondary | ICD-10-CM | POA: Diagnosis not present

## 2019-06-11 DIAGNOSIS — M1712 Unilateral primary osteoarthritis, left knee: Secondary | ICD-10-CM | POA: Diagnosis present

## 2019-06-11 DIAGNOSIS — M179 Osteoarthritis of knee, unspecified: Secondary | ICD-10-CM | POA: Diagnosis present

## 2019-06-11 DIAGNOSIS — Z85828 Personal history of other malignant neoplasm of skin: Secondary | ICD-10-CM | POA: Insufficient documentation

## 2019-06-11 DIAGNOSIS — L409 Psoriasis, unspecified: Secondary | ICD-10-CM | POA: Insufficient documentation

## 2019-06-11 DIAGNOSIS — I110 Hypertensive heart disease with heart failure: Secondary | ICD-10-CM | POA: Diagnosis not present

## 2019-06-11 DIAGNOSIS — M1710 Unilateral primary osteoarthritis, unspecified knee: Secondary | ICD-10-CM | POA: Diagnosis present

## 2019-06-11 DIAGNOSIS — Z79899 Other long term (current) drug therapy: Secondary | ICD-10-CM | POA: Insufficient documentation

## 2019-06-11 DIAGNOSIS — H9190 Unspecified hearing loss, unspecified ear: Secondary | ICD-10-CM | POA: Diagnosis not present

## 2019-06-11 DIAGNOSIS — G4730 Sleep apnea, unspecified: Secondary | ICD-10-CM | POA: Insufficient documentation

## 2019-06-11 DIAGNOSIS — I5032 Chronic diastolic (congestive) heart failure: Secondary | ICD-10-CM | POA: Diagnosis not present

## 2019-06-11 HISTORY — PX: TOTAL KNEE ARTHROPLASTY: SHX125

## 2019-06-11 LAB — TYPE AND SCREEN
ABO/RH(D): O POS
Antibody Screen: NEGATIVE

## 2019-06-11 SURGERY — ARTHROPLASTY, KNEE, TOTAL
Anesthesia: Spinal | Laterality: Left

## 2019-06-11 MED ORDER — ONDANSETRON HCL 4 MG/2ML IJ SOLN: 4.0000 mg | Freq: Four times a day (QID) | INTRAMUSCULAR | Status: DC | PRN

## 2019-06-11 MED ORDER — ACETAMINOPHEN 10 MG/ML IV SOLN
INTRAVENOUS | Status: DC | PRN
  Administered 2019-06-11: 1000 mg via INTRAVENOUS

## 2019-06-11 MED ORDER — SODIUM CHLORIDE (PF) 0.9 % IJ SOLN
INTRAMUSCULAR | Status: DC | PRN
  Administered 2019-06-11: 60 mL

## 2019-06-11 MED ORDER — FLEET ENEMA 7-19 GM/118ML RE ENEM: 1.0000 | ENEMA | Freq: Once | RECTAL | Status: DC | PRN

## 2019-06-11 MED ORDER — TRANEXAMIC ACID-NACL 1000-0.7 MG/100ML-% IV SOLN
1000.0000 mg | INTRAVENOUS | Status: AC
  Administered 2019-06-11: 11:00:00 1000 mg via INTRAVENOUS
  Filled 2019-06-11: qty 100

## 2019-06-11 MED ORDER — LACTATED RINGERS IV SOLN
INTRAVENOUS | Status: DC
  Administered 2019-06-11 (×2): via INTRAVENOUS

## 2019-06-11 MED ORDER — ONDANSETRON HCL 4 MG PO TABS: 4.0000 mg | ORAL_TABLET | Freq: Four times a day (QID) | ORAL | Status: DC | PRN

## 2019-06-11 MED ORDER — BUPIVACAINE IN DEXTROSE 0.75-8.25 % IT SOLN
INTRATHECAL | Status: DC | PRN
  Administered 2019-06-11: 1.6 mL via INTRATHECAL

## 2019-06-11 MED ORDER — CEFAZOLIN SODIUM-DEXTROSE 2-4 GM/100ML-% IV SOLN
2.0000 g | Freq: Four times a day (QID) | INTRAVENOUS | Status: AC
  Administered 2019-06-11 (×2): 2 g via INTRAVENOUS
  Filled 2019-06-11 (×2): qty 100

## 2019-06-11 MED ORDER — DOCUSATE SODIUM 100 MG PO CAPS
100.0000 mg | ORAL_CAPSULE | Freq: Two times a day (BID) | ORAL | Status: DC
  Administered 2019-06-11 – 2019-06-12 (×2): 100 mg via ORAL
  Filled 2019-06-11 (×2): qty 1

## 2019-06-11 MED ORDER — PROPOFOL 500 MG/50ML IV EMUL
INTRAVENOUS | Status: DC | PRN
  Administered 2019-06-11: 100 ug/kg/min via INTRAVENOUS

## 2019-06-11 MED ORDER — EZETIMIBE 10 MG PO TABS
10.0000 mg | ORAL_TABLET | Freq: Every day | ORAL | Status: DC
  Administered 2019-06-12: 11:00:00 10 mg via ORAL
  Filled 2019-06-11: qty 1

## 2019-06-11 MED ORDER — SODIUM CHLORIDE 0.9 % IV SOLN
INTRAVENOUS | Status: DC
  Administered 2019-06-11 – 2019-06-12 (×2): via INTRAVENOUS

## 2019-06-11 MED ORDER — METHOCARBAMOL 500 MG IVPB - SIMPLE MED
500.0000 mg | Freq: Four times a day (QID) | INTRAVENOUS | Status: DC | PRN
  Administered 2019-06-11: 500 mg via INTRAVENOUS
  Filled 2019-06-11: qty 500
  Filled 2019-06-11 (×2): qty 50

## 2019-06-11 MED ORDER — SODIUM CHLORIDE 0.9 % IV SOLN
INTRAVENOUS | Status: DC | PRN
  Administered 2019-06-11: 40 ug/min via INTRAVENOUS

## 2019-06-11 MED ORDER — PHENOL 1.4 % MT LIQD: 1.0000 | OROMUCOSAL | Status: DC | PRN

## 2019-06-11 MED ORDER — OXYCODONE HCL 5 MG PO TABS
5.0000 mg | ORAL_TABLET | ORAL | Status: DC | PRN
  Administered 2019-06-11 – 2019-06-12 (×4): 10 mg via ORAL
  Filled 2019-06-11 (×4): qty 2

## 2019-06-11 MED ORDER — ACETAMINOPHEN 500 MG PO TABS
1000.0000 mg | ORAL_TABLET | Freq: Four times a day (QID) | ORAL | Status: AC
  Administered 2019-06-11 – 2019-06-12 (×4): 1000 mg via ORAL
  Filled 2019-06-11 (×4): qty 2

## 2019-06-11 MED ORDER — METOCLOPRAMIDE HCL 5 MG/ML IJ SOLN: 5.0000 mg | Freq: Three times a day (TID) | INTRAMUSCULAR | Status: DC | PRN

## 2019-06-11 MED ORDER — CEFAZOLIN SODIUM-DEXTROSE 2-4 GM/100ML-% IV SOLN
2.0000 g | INTRAVENOUS | Status: AC
  Administered 2019-06-11: 2 g via INTRAVENOUS
  Filled 2019-06-11: qty 100

## 2019-06-11 MED ORDER — METHOCARBAMOL 500 MG PO TABS
500.0000 mg | ORAL_TABLET | Freq: Four times a day (QID) | ORAL | Status: DC | PRN
  Administered 2019-06-11 – 2019-06-12 (×2): 500 mg via ORAL
  Filled 2019-06-11 (×2): qty 1

## 2019-06-11 MED ORDER — TRAMADOL HCL 50 MG PO TABS
50.0000 mg | ORAL_TABLET | Freq: Four times a day (QID) | ORAL | Status: DC | PRN
  Administered 2019-06-12 (×3): 100 mg via ORAL
  Filled 2019-06-11 (×3): qty 2

## 2019-06-11 MED ORDER — MORPHINE SULFATE (PF) 2 MG/ML IV SOLN
1.0000 mg | INTRAVENOUS | Status: DC | PRN
  Administered 2019-06-11: 1 mg via INTRAVENOUS
  Filled 2019-06-11: qty 1

## 2019-06-11 MED ORDER — METOCLOPRAMIDE HCL 5 MG PO TABS: 5.0000 mg | ORAL_TABLET | Freq: Three times a day (TID) | ORAL | Status: DC | PRN

## 2019-06-11 MED ORDER — POVIDONE-IODINE 10 % EX SWAB
2.0000 "application " | Freq: Once | CUTANEOUS | Status: AC
  Administered 2019-06-11: 2 via TOPICAL

## 2019-06-11 MED ORDER — DIPHENHYDRAMINE HCL 12.5 MG/5ML PO ELIX: 12.5000 mg | ORAL_SOLUTION | ORAL | Status: DC | PRN

## 2019-06-11 MED ORDER — ROPIVACAINE HCL 7.5 MG/ML IJ SOLN
INTRAMUSCULAR | Status: DC | PRN
  Administered 2019-06-11: 20 mL via PERINEURAL

## 2019-06-11 MED ORDER — SODIUM CHLORIDE (PF) 0.9 % IJ SOLN
INTRAMUSCULAR | Status: AC
  Filled 2019-06-11: qty 10

## 2019-06-11 MED ORDER — ACETAMINOPHEN 10 MG/ML IV SOLN
1000.0000 mg | Freq: Four times a day (QID) | INTRAVENOUS | Status: DC
  Filled 2019-06-11: qty 100

## 2019-06-11 MED ORDER — CLONIDINE HCL (ANALGESIA) 100 MCG/ML EP SOLN
EPIDURAL | Status: DC | PRN
  Administered 2019-06-11: 50 ug

## 2019-06-11 MED ORDER — ROSUVASTATIN CALCIUM 20 MG PO TABS
40.0000 mg | ORAL_TABLET | Freq: Every day | ORAL | Status: DC
  Administered 2019-06-11: 22:00:00 40 mg via ORAL
  Filled 2019-06-11: qty 2

## 2019-06-11 MED ORDER — PROPOFOL 10 MG/ML IV BOLUS
INTRAVENOUS | Status: AC
  Filled 2019-06-11: qty 60

## 2019-06-11 MED ORDER — PROPOFOL 10 MG/ML IV BOLUS
INTRAVENOUS | Status: AC
  Filled 2019-06-11: qty 20

## 2019-06-11 MED ORDER — DEXAMETHASONE SODIUM PHOSPHATE 10 MG/ML IJ SOLN: 8.0000 mg | Freq: Once | INTRAMUSCULAR | Status: DC

## 2019-06-11 MED ORDER — SODIUM CHLORIDE (PF) 0.9 % IJ SOLN
INTRAMUSCULAR | Status: AC
  Filled 2019-06-11: qty 50

## 2019-06-11 MED ORDER — DEXAMETHASONE SODIUM PHOSPHATE 10 MG/ML IJ SOLN
10.0000 mg | Freq: Once | INTRAMUSCULAR | Status: AC
  Administered 2019-06-12: 11:00:00 10 mg via INTRAVENOUS
  Filled 2019-06-11: qty 1

## 2019-06-11 MED ORDER — GABAPENTIN 100 MG PO CAPS
200.0000 mg | ORAL_CAPSULE | Freq: Three times a day (TID) | ORAL | Status: DC
  Administered 2019-06-11 – 2019-06-12 (×4): 200 mg via ORAL
  Filled 2019-06-11 (×4): qty 2

## 2019-06-11 MED ORDER — SODIUM CHLORIDE 0.9 % IR SOLN
Status: DC | PRN
  Administered 2019-06-11: 1000 mL

## 2019-06-11 MED ORDER — BISACODYL 10 MG RE SUPP: 10.0000 mg | Freq: Every day | RECTAL | Status: DC | PRN

## 2019-06-11 MED ORDER — MIDAZOLAM HCL 2 MG/2ML IJ SOLN
1.0000 mg | INTRAMUSCULAR | Status: DC
  Filled 2019-06-11: qty 2

## 2019-06-11 MED ORDER — ASPIRIN EC 325 MG PO TBEC
325.0000 mg | DELAYED_RELEASE_TABLET | Freq: Two times a day (BID) | ORAL | Status: DC
  Administered 2019-06-12: 325 mg via ORAL
  Filled 2019-06-11: qty 1

## 2019-06-11 MED ORDER — FENTANYL CITRATE (PF) 100 MCG/2ML IJ SOLN
50.0000 ug | INTRAMUSCULAR | Status: DC
  Administered 2019-06-11: 09:00:00 100 ug via INTRAVENOUS
  Filled 2019-06-11: qty 2

## 2019-06-11 MED ORDER — CHLORHEXIDINE GLUCONATE 4 % EX LIQD: 60.0000 mL | Freq: Once | CUTANEOUS | Status: DC

## 2019-06-11 MED ORDER — LEVETIRACETAM 500 MG PO TABS
750.0000 mg | ORAL_TABLET | Freq: Two times a day (BID) | ORAL | Status: DC
  Administered 2019-06-11 – 2019-06-12 (×2): 750 mg via ORAL
  Filled 2019-06-11 (×3): qty 1

## 2019-06-11 MED ORDER — ONDANSETRON HCL 4 MG/2ML IJ SOLN
INTRAMUSCULAR | Status: DC | PRN
  Administered 2019-06-11: 4 mg via INTRAVENOUS

## 2019-06-11 MED ORDER — POLYETHYLENE GLYCOL 3350 17 G PO PACK: 17.0000 g | PACK | Freq: Every day | ORAL | Status: DC | PRN

## 2019-06-11 MED ORDER — BUPIVACAINE LIPOSOME 1.3 % IJ SUSP
INTRAMUSCULAR | Status: DC | PRN
  Administered 2019-06-11: 20 mL

## 2019-06-11 MED ORDER — DEXAMETHASONE SODIUM PHOSPHATE 4 MG/ML IJ SOLN
INTRAMUSCULAR | Status: DC | PRN
  Administered 2019-06-11: 10 mg via PERINEURAL

## 2019-06-11 MED ORDER — MENTHOL 3 MG MT LOZG: 1.0000 | LOZENGE | OROMUCOSAL | Status: DC | PRN

## 2019-06-11 SURGICAL SUPPLY — 63 items
ATTUNE MED DOME PAT 41 KNEE (Knees) ×2 IMPLANT
ATTUNE MED DOME PAT 41MM KNEE (Knees) ×1 IMPLANT
ATTUNE PS FEM LT SZ 7 CEM KNEE (Femur) ×3 IMPLANT
ATTUNE PSRP INSR SZ7 10 KNEE (Insert) ×2 IMPLANT
ATTUNE PSRP INSR SZ7 10MM KNEE (Insert) ×1 IMPLANT
BAG SPEC THK2 15X12 ZIP CLS (MISCELLANEOUS) ×1
BAG ZIPLOCK 12X15 (MISCELLANEOUS) ×3 IMPLANT
BASE TIBIAL ROT PLAT SZ 7 KNEE (Knees) ×1 IMPLANT
BLADE SAG 18X100X1.27 (BLADE) ×3 IMPLANT
BLADE SAW SGTL 11.0X1.19X90.0M (BLADE) ×3 IMPLANT
BLADE SURG SZ10 CARB STEEL (BLADE) ×6 IMPLANT
BNDG ELASTIC 6X5.8 VLCR STR LF (GAUZE/BANDAGES/DRESSINGS) ×3 IMPLANT
BOWL SMART MIX CTS (DISPOSABLE) ×3 IMPLANT
BSPLAT TIB 7 CMNT ROT PLAT STR (Knees) ×1 IMPLANT
CEMENT HV SMART SET (Cement) ×3 IMPLANT
CLOSURE STERI-STRIP 1/2X4 (GAUZE/BANDAGES/DRESSINGS) ×1
CLOSURE WOUND 1/2 X4 (GAUZE/BANDAGES/DRESSINGS) ×2
CLSR STERI-STRIP ANTIMIC 1/2X4 (GAUZE/BANDAGES/DRESSINGS) ×2 IMPLANT
COVER SURGICAL LIGHT HANDLE (MISCELLANEOUS) ×3 IMPLANT
COVER WAND RF STERILE (DRAPES) IMPLANT
CUFF TOURN SGL QUICK 34 (TOURNIQUET CUFF) ×2
CUFF TRNQT CYL 34X4.125X (TOURNIQUET CUFF) ×1 IMPLANT
DECANTER SPIKE VIAL GLASS SM (MISCELLANEOUS) ×3 IMPLANT
DRAPE U-SHAPE 47X51 STRL (DRAPES) ×3 IMPLANT
DRSG ADAPTIC 3X8 NADH LF (GAUZE/BANDAGES/DRESSINGS) ×3 IMPLANT
DRSG PAD ABDOMINAL 8X10 ST (GAUZE/BANDAGES/DRESSINGS) ×3 IMPLANT
DURAPREP 26ML APPLICATOR (WOUND CARE) ×3 IMPLANT
ELECT REM PT RETURN 15FT ADLT (MISCELLANEOUS) ×3 IMPLANT
EVACUATOR 1/8 PVC DRAIN (DRAIN) ×3 IMPLANT
GAUZE SPONGE 4X4 12PLY STRL (GAUZE/BANDAGES/DRESSINGS) ×3 IMPLANT
GLOVE BIO SURGEON STRL SZ7 (GLOVE) ×3 IMPLANT
GLOVE BIO SURGEON STRL SZ8 (GLOVE) ×3 IMPLANT
GLOVE BIOGEL PI IND STRL 6.5 (GLOVE) ×1 IMPLANT
GLOVE BIOGEL PI IND STRL 7.0 (GLOVE) ×1 IMPLANT
GLOVE BIOGEL PI IND STRL 8 (GLOVE) ×1 IMPLANT
GLOVE BIOGEL PI INDICATOR 6.5 (GLOVE) ×2
GLOVE BIOGEL PI INDICATOR 7.0 (GLOVE) ×2
GLOVE BIOGEL PI INDICATOR 8 (GLOVE) ×2
GLOVE SURG SS PI 6.5 STRL IVOR (GLOVE) ×3 IMPLANT
GOWN STRL REUS W/TWL LRG LVL3 (GOWN DISPOSABLE) ×9 IMPLANT
HANDPIECE INTERPULSE COAX TIP (DISPOSABLE) ×3
HOLDER FOLEY CATH W/STRAP (MISCELLANEOUS) IMPLANT
IMMOBILIZER KNEE 20 (SOFTGOODS) ×3
IMMOBILIZER KNEE 20 THIGH 36 (SOFTGOODS) ×1 IMPLANT
KIT TURNOVER KIT A (KITS) IMPLANT
MANIFOLD NEPTUNE II (INSTRUMENTS) ×3 IMPLANT
NS IRRIG 1000ML POUR BTL (IV SOLUTION) ×3 IMPLANT
PACK TOTAL KNEE CUSTOM (KITS) ×3 IMPLANT
PADDING CAST COTTON 6X4 STRL (CAST SUPPLIES) ×3 IMPLANT
PIN STEINMAN FIXATION KNEE (PIN) ×3 IMPLANT
PROTECTOR NERVE ULNAR (MISCELLANEOUS) ×3 IMPLANT
SET HNDPC FAN SPRY TIP SCT (DISPOSABLE) ×1 IMPLANT
STRIP CLOSURE SKIN 1/2X4 (GAUZE/BANDAGES/DRESSINGS) ×4 IMPLANT
SUT MNCRL AB 4-0 PS2 18 (SUTURE) ×3 IMPLANT
SUT STRATAFIX 0 PDS 27 VIOLET (SUTURE) ×3
SUT VIC AB 2-0 CT1 27 (SUTURE) ×6
SUT VIC AB 2-0 CT1 TAPERPNT 27 (SUTURE) ×3 IMPLANT
SUTURE STRATFX 0 PDS 27 VIOLET (SUTURE) ×1 IMPLANT
TIBIAL BASE ROT PLAT SZ 7 KNEE (Knees) ×3 IMPLANT
TRAY FOLEY MTR SLVR 16FR STAT (SET/KITS/TRAYS/PACK) ×3 IMPLANT
WATER STERILE IRR 1000ML POUR (IV SOLUTION) ×6 IMPLANT
WRAP KNEE MAXI GEL POST OP (GAUZE/BANDAGES/DRESSINGS) ×3 IMPLANT
YANKAUER SUCT BULB TIP 10FT TU (MISCELLANEOUS) ×3 IMPLANT

## 2019-06-11 NOTE — Anesthesia Procedure Notes (Signed)
Spinal  Patient location during procedure: OR Start time: 06/11/2019 10:09 AM End time: 06/11/2019 10:12 AM Reason for block: at surgeon's request Staffing Resident/CRNA: ,  M, CRNA Performed: resident/CRNA  Preanesthetic Checklist Completed: patient identified, site marked, surgical consent, pre-op evaluation, timeout performed, IV checked, risks and benefits discussed and monitors and equipment checked Spinal Block Patient position: sitting Prep: DuraPrep Patient monitoring: heart rate, continuous pulse ox and blood pressure Approach: midline Location: L2-3 Injection technique: single-shot Needle Needle type: Pencan  Needle gauge: 24 G Needle length: 9 cm Assessment Sensory level: T6 Additional Notes  Functioning IV was confirmed and monitors were applied. Expiration date of kit checked and confirmed. Sterile prep and drape, including hand hygiene and sterile gloves were used. The patient was positioned and the spine was prepped. The skin was anesthetized with lidocaine.  Free flow of clear CSF was obtained prior to injecting local anesthetic into the CSF X 1 attempt.  The spinal needle aspirated freely following injection.  The needle was carefully withdrawn. Patient tolerated procedure well, without complications. Loss of motor and sensory on exam post injection.      

## 2019-06-11 NOTE — Anesthesia Procedure Notes (Signed)
Anesthesia Regional Block: Adductor canal block   Pre-Anesthetic Checklist: ,, timeout performed, Correct Patient, Correct Site, Correct Laterality, Correct Procedure, Correct Position, site marked, Risks and benefits discussed,  Surgical consent,  Pre-op evaluation,  At surgeon's request and post-op pain management  Laterality: Left  Prep: chloraprep       Needles:  Injection technique: Single-shot  Needle Type: Stimiplex     Needle Length: 9cm  Needle Gauge: 21     Additional Needles:   Procedures:,,,, ultrasound used (permanent image in chart),,,,  Narrative:  Start time: 06/11/2019 8:50 AM End time: 06/11/2019 8:55 AM Injection made incrementally with aspirations every 5 mL.  Performed by: Personally  Anesthesiologist: Nolon Nations, MD  Additional Notes: BP cuff, EKG monitors applied. Sedation begun. Artery and nerve location verified with U/S and anesthetic injected incrementally, slowly, and after negative aspirations under direct u/s guidance. Good fascial /perineural spread. Tolerated well.

## 2019-06-11 NOTE — Anesthesia Preprocedure Evaluation (Signed)
Anesthesia Evaluation  Patient identified by MRN, date of birth, ID band Patient awake    Reviewed: Allergy & Precautions, NPO status , Patient's Chart, lab work & pertinent test results  History of Anesthesia Complications Negative for: history of anesthetic complications  Airway Mallampati: III  TM Distance: >3 FB Neck ROM: Full    Dental  (+) Teeth Intact, Dental Advisory Given   Pulmonary sleep apnea ,    Pulmonary exam normal breath sounds clear to auscultation       Cardiovascular hypertension, + CAD, + Cardiac Stents and +CHF  Normal cardiovascular exam Rhythm:Regular Rate:Normal  Proximal RCA DES placed in 2017  Severe AS s/p TAVR in 2018  TTE 02/2018: severe LVH, grade 2 diastolic dysfunction, mild AR   Neuro/Psych Seizures - (last 2012), Well Controlled,     GI/Hepatic negative GI ROS, Neg liver ROS,   Endo/Other  negative endocrine ROS  Renal/GU Renal disease     Musculoskeletal  (+) Arthritis , Osteoarthritis,    Abdominal   Peds  Hematology negative hematology ROS (+)   Anesthesia Other Findings Day of surgery medications reviewed with the patient.  Reproductive/Obstetrics                             Anesthesia Physical  Anesthesia Plan  ASA: III  Anesthesia Plan: Spinal   Post-op Pain Management:  Regional for Post-op pain   Induction:   PONV Risk Score and Plan: 1 and Treatment may vary due to age or medical condition, Ondansetron and Midazolam  Airway Management Planned: Natural Airway and Nasal Cannula  Additional Equipment:   Intra-op Plan:   Post-operative Plan:   Informed Consent: I have reviewed the patients History and Physical, chart, labs and discussed the procedure including the risks, benefits and alternatives for the proposed anesthesia with the patient or authorized representative who has indicated his/her understanding and acceptance.        Plan Discussed with: CRNA  Anesthesia Plan Comments: Karoline Caldwell, [place] note: Follows with Cardiology for hx of CAD (s/p DES to RCA (03/2016)and 40% prox LAD and 50% ostial PDA), AS s/p TAVR 8/18, HFpEF. Last seen by Dr. Radford Pax 05/10/19 and doing well at that time.   TTE 10/26/18:  1. The left ventricle has normal systolic function with an ejection fraction of 60-65%. The cavity size was normal. There is moderately increased left ventricular wall thickness. Left ventricular diastolic Doppler parameters are consistent with impaired  relaxation.  2. The right ventricle has normal systolic function. The cavity was normal. There is no increase in right ventricular wall thickness.  3. No evidence of mitral valve stenosis. Trivial mitral regurgitation.  4. Bioprosthetic aortic valve s/p TAVR, 26 mm Edwards. Trivial perivalvular regurgitation. Mean gradient 11 mmHg, not significantly elevated.  5. There is mild dilatation of the ascending aorta measuring 37 mm.  6. Left atrial size was mildly dilated.  7. Trivial pericardial effusion is present.  8. The IVC was normal in size. No complete TR doppler jet so unable to estimate PA systolic pressure. )        Anesthesia Quick Evaluation

## 2019-06-11 NOTE — Evaluation (Signed)
Physical Therapy Evaluation Patient Details Name: Douglas Lane MRN: [id number] DOB: 1945/09/30 Today's Date: 06/11/2019   History of Present Illness  Patient is a 73 y.o. male s/p Lt TKA on 06/11/19 with PMH significant for siezures, chronic back pain, and Rt TKA on 07/24/18.    Clinical Impression  Douglas Lane is a 73 y.o. male POD 0 s/p Lt TKA. Patient reports independence with mobility at baseline. Patient is now limited by functional impairments (see PT problem list below) and requires min assist for transfers and gait with RW. Patient was able to ambulate ~20 feet with RW and min assist with cues for safe step sequencing and guarding/manual facilitation of Lt knee extension in stance phase to prevent buckling. Patient instructed in exercise to facilitate ROM and circulation. Patient will benefit from continued skilled PT interventions to address impairments and progress towards PLOF. Acute PT will follow to progress mobility and stair training in preparation for safe discharge home.     Follow Up Recommendations Follow surgeon's recommendation for DC plan and follow-up therapies    Equipment Recommendations  None recommended by PT    Recommendations for Other Services       Precautions / Restrictions Precautions Precautions: Fall Restrictions Weight Bearing Restrictions: No      Mobility  Bed Mobility Overal bed mobility: Needs Assistance Bed Mobility: Supine to Sit     Supine to sit: HOB elevated;Min assist     General bed mobility comments: verbal/tactile cues for use of bed rails and assist to press up to sit EOB  Transfers Overall transfer level: Needs assistance Equipment used: Rolling walker (2 wheeled) Transfers: Sit to/from Stand Sit to Stand: Min assist;From elevated surface         General transfer comment: cues for safe hand placement and technique with RW, min assist to initiate power up and complete rise to  stand  Ambulation/Gait Ambulation/Gait assistance: Min assist Gait Distance (Feet): 20 Feet Assistive device: Rolling walker (2 wheeled) Gait Pattern/deviations: Step-through pattern;Decreased stride length;Decreased weight shift to left;Decreased stance time - left;Decreased step length - left;Decreased step length - right;Antalgic;Trunk flexed Gait velocity: decreased   General Gait Details: pt required repeated cues for step sequencing and safety with RW, pt requried manual facilitation of Lt knee for extension in stance phase and verbal cues to encourage UE support to reduce WB in Lt LE for pain control and to prevent buckling, no overt LOB however pt with poor awareness of deficits  Stairs            Wheelchair Mobility    Modified Rankin (Stroke Patients Only)       Balance Overall balance assessment: Needs assistance Sitting-balance support: No upper extremity supported;Feet supported Sitting balance-Leahy Scale: Good     Standing balance support: During functional activity;Bilateral upper extremity supported Standing balance-Leahy Scale: Poor              Pertinent Vitals/Pain Pain Assessment: 0-10 Pain Score: 6  Pain Location: Lt knee Pain Descriptors / Indicators: Aching;Sore Pain Intervention(s): Limited activity within patient's tolerance;Monitored during session;Repositioned;Ice applied    Home Living Family/patient expects to be discharged to:: Private residence Living Arrangements: Spouse/significant other;Children Available Help at Discharge: Family Type of Home: House Home Access: Stairs to enter Entrance Stairs-Rails: Right Entrance Stairs-Number of Steps: 3 steps with Rt post (10 steps at front with 2 hand rails) Home Layout: Two level;Able to live on main level with bedroom/bathroom Home Equipment: Gilford Rile - 2 wheels;Cane - single point;Crutches;Shower  seat      Prior Function Level of Independence: Independent               Hand  Dominance   Dominant Hand: Right    Extremity/Trunk Assessment   Upper Extremity Assessment Upper Extremity Assessment: Overall WFL for tasks assessed    Lower Extremity Assessment Lower Extremity Assessment: Generalized weakness;LLE deficits/detail LLE Deficits / Details: pt with good Lt quad activation in supine and with no extensor lag with SLR, 4/5 or greater for quad strength with MMT; pt reports some loss of sensation along anterior knee distal to patella LLE Sensation: decreased light touch LLE Coordination: WNL    Cervical / Trunk Assessment Cervical / Trunk Assessment: Normal  Communication   Communication: No difficulties  Cognition Arousal/Alertness: Awake/alert Behavior During Therapy: WFL for tasks assessed/performed Overall Cognitive Status: Within Functional Limits for tasks assessed             General Comments      Exercises Total Joint Exercises Ankle Circles/Pumps: AROM;15 reps;Seated;Both Quad Sets: AROM;5 reps;Supine;Left   Assessment/Plan    PT Assessment Patient needs continued PT services  PT Problem List Decreased strength;Decreased balance;Decreased mobility;Decreased range of motion;Decreased activity tolerance;Decreased knowledge of use of DME       PT Treatment Interventions DME instruction;Functional mobility training;Balance training;Patient/family education;Modalities;Gait training;Therapeutic activities;Therapeutic exercise;Stair training    PT Goals (Current goals can be found in the Care Plan section)  Acute Rehab PT Goals Patient Stated Goal: to return home and get back to independence PT Goal Formulation: With patient Time For Goal Achievement: 06/18/19 Potential to Achieve Goals: Good    Frequency 7X/week    AM-PAC PT "6 Clicks" Mobility  Outcome Measure Help needed turning from your back to your side while in a flat bed without using bedrails?: A Little Help needed moving from lying on your back to sitting on the side of  a flat bed without using bedrails?: A Little Help needed moving to and from a bed to a chair (including a wheelchair)?: A Little Help needed standing up from a chair using your arms (e.g., wheelchair or bedside chair)?: A Little Help needed to walk in hospital room?: A Little Help needed climbing 3-5 steps with a railing? : A Lot 6 Click Score: 17    End of Session Equipment Utilized During Treatment: Gait belt Activity Tolerance: Patient tolerated treatment well Patient left: in chair;with call bell/phone within reach;with family/visitor present;with chair alarm set Nurse Communication: Mobility status PT Visit Diagnosis: Muscle weakness (generalized) (M62.81);Difficulty in walking, not elsewhere classified (R26.2);Unsteadiness on feet (R26.81)    Time: [id number] PT Time Calculation (min) (ACUTE ONLY): 20 min   Charges:   PT Evaluation $PT Eval Low Complexity: 1 Low         Kipp Brood, PT, DPT Physical Therapist with [facility]  06/11/2019 7:43 PM

## 2019-06-11 NOTE — Progress Notes (Signed)
Assisted Dr. Lissa Hoard with left, ultrasound guided, adductor canal block. Side rails up, monitors on throughout procedure. See vital signs in flow sheet. Tolerated Procedure well.

## 2019-06-11 NOTE — Discharge Instructions (Addendum)
° °Dr. Frank Aluisio °Total Joint Specialist °[facility] °[address] °[place], [place] [address] °[phone] ° °TOTAL KNEE REPLACEMENT POSTOPERATIVE DIRECTIONS ° °Knee Rehabilitation, Guidelines Following Surgery  °Results after knee surgery are often greatly improved when you follow the exercise, range of motion and muscle strengthening exercises prescribed by your doctor. Safety measures are also important to protect the knee from further injury. Any time any of these exercises cause you to have increased pain or swelling in your knee joint, decrease the amount until you are comfortable again and slowly increase them. If you have problems or questions, call your caregiver or physical therapist for advice.  ° °HOME CARE INSTRUCTIONS  °Remove items at home which could result in a fall. This includes throw rugs or furniture in walking pathways.  °· ICE to the affected knee every three hours for 30 minutes at a time and then as needed for pain and swelling.  Continue to use ice on the knee for pain and swelling from surgery. You may notice swelling that will progress down to the foot and ankle.  This is normal after surgery.  Elevate the leg when you are not up walking on it.   °· Continue to use the breathing machine which will help keep your temperature down.  It is common for your temperature to cycle up and down following surgery, especially at night when you are not up moving around and exerting yourself.  The breathing machine keeps your lungs expanded and your temperature down. °· Do not place pillow under knee, focus on keeping the knee straight while resting ° °DIET °You may resume your previous home diet once your are discharged from the hospital. ° °DRESSING / WOUND CARE / SHOWERING °You may shower 3 days after surgery, but keep the wounds dry during showering.  You may use an occlusive plastic wrap (Press'n Seal for example), NO SOAKING/SUBMERGING IN THE BATHTUB.  If the bandage gets  wet, change with a clean dry gauze.  If the incision gets wet, pat the wound dry with a clean towel. °You may start showering once you are discharged home but do not submerge the incision under water. Just pat the incision dry and apply a dry gauze dressing on daily. °Change the surgical dressing daily and reapply a dry dressing each time. ° °ACTIVITY °Walk with your walker as instructed. °Use walker as long as suggested by your caregivers. °Avoid periods of inactivity such as sitting longer than an hour when not asleep. This helps prevent blood clots.  °You may resume a sexual relationship in one month or when given the OK by your doctor.  °You may return to work once you are cleared by your doctor.  °Do not drive a car for 6 weeks or until released by you surgeon.  °Do not drive while taking narcotics. ° °WEIGHT BEARING °Weight bearing as tolerated with assist device (walker, cane, etc) as directed, use it as long as suggested by your surgeon or therapist, typically at least 4-6 weeks. ° °POSTOPERATIVE CONSTIPATION PROTOCOL °Constipation - defined medically as fewer than three stools per week and severe constipation as less than one stool per week. ° °One of the most common issues patients have following surgery is constipation.  Even if you have a regular bowel pattern at home, your normal regimen is likely to be disrupted due to multiple reasons following surgery.  Combination of anesthesia, postoperative narcotics, change in appetite and fluid intake all can affect your bowels.    In order to avoid complications following surgery, here are some recommendations in order to help you during your recovery period. ° °Colace (docusate) - Pick up an over-the-counter form of Colace or another stool softener and take twice a day as long as you are requiring postoperative pain medications.  Take with a full glass of water daily.  If you experience loose stools or diarrhea, hold the colace until you stool forms back up.  If  your symptoms do not get better within 1 week or if they get worse, check with your doctor. ° °Dulcolax (bisacodyl) - Pick up over-the-counter and take as directed by the product packaging as needed to assist with the movement of your bowels.  Take with a full glass of water.  Use this product as needed if not relieved by Colace only.  ° °MiraLax (polyethylene glycol) - Pick up over-the-counter to have on hand.  MiraLax is a solution that will increase the amount of water in your bowels to assist with bowel movements.  Take as directed and can mix with a glass of water, juice, soda, coffee, or tea.  Take if you go more than two days without a movement. °Do not use MiraLax more than once per day. Call your doctor if you are still constipated or irregular after using this medication for 7 days in a row. ° °If you continue to have problems with postoperative constipation, please contact the office for further assistance and recommendations.  If you experience "the worst abdominal pain ever" or develop nausea or vomiting, please contact the office immediatly for further recommendations for treatment. ° °ITCHING ° If you experience itching with your medications, try taking only a single pain pill, or even half a pain pill at a time.  You can also use Benadryl over the counter for itching or also to help with sleep.  ° °TED HOSE STOCKINGS °Wear the elastic stockings on both legs for three weeks following surgery during the day but you may remove then at night for sleeping. ° °MEDICATIONS °See your medication summary on the “After Visit Summary” that the nursing staff will review with you prior to discharge.  You may have some home medications which will be placed on hold until you complete the course of blood thinner medication.  It is important for you to complete the blood thinner medication as prescribed by your surgeon.  Continue your approved medications as instructed at time of discharge. ° °Gabapentin 200 mg  Protocol °Take a 200 mg capsule three times a day for two weeks, °Then a 200 mg capsule twice a day for two weeks, °Then a 200 mg capsule once a day for two weeks, then discontinue the Gabapentin. ° °PRECAUTIONS °If you experience chest pain or shortness of breath - call 911 immediately for transfer to the hospital emergency department.  °If you develop a fever greater that 101 F, purulent drainage from wound, increased redness or drainage from wound, foul odor from the wound/dressing, or calf pain - CONTACT YOUR SURGEON.   °                                                °FOLLOW-UP APPOINTMENTS °Make sure you keep all of your appointments after your operation with your surgeon and caregivers. You should call the office at the above phone number and make an appointment for   approximately two weeks after the date of your surgery or on the date instructed by your surgeon outlined in the "After Visit Summary". ° ° °RANGE OF MOTION AND STRENGTHENING EXERCISES  °Rehabilitation of the knee is important following a knee injury or an operation. After just a few days of immobilization, the muscles of the thigh which control the knee become weakened and shrink (atrophy). Knee exercises are designed to build up the tone and strength of the thigh muscles and to improve knee motion. Often times heat used for twenty to thirty minutes before working out will loosen up your tissues and help with improving the range of motion but do not use heat for the first two weeks following surgery. These exercises can be done on a training (exercise) mat, on the floor, on a table or on a bed. Use what ever works the best and is most comfortable for you Knee exercises include:  °Leg Lifts - While your knee is still immobilized in a splint or cast, you can do straight leg raises. Lift the leg to 60 degrees, hold for 3 sec, and slowly lower the leg. Repeat 10-20 times 2-3 times daily. Perform this exercise against resistance later as your knee  gets better.  °Quad and Hamstring Sets - Tighten up the muscle on the front of the thigh (Quad) and hold for 5-10 sec. Repeat this 10-20 times hourly. Hamstring sets are done by pushing the foot backward against an object and holding for 5-10 sec. Repeat as with quad sets.  °· Leg Slides: Lying on your back, slowly slide your foot toward your buttocks, bending your knee up off the floor (only go as far as is comfortable). Then slowly slide your foot back down until your leg is flat on the floor again. °· Angel Wings: Lying on your back spread your legs to the side as far apart as you can without causing discomfort.  °A rehabilitation program following serious knee injuries can speed recovery and prevent re-injury in the future due to weakened muscles. Contact your doctor or a physical therapist for more information on knee rehabilitation.  ° °IF YOU ARE TRANSFERRED TO A SKILLED REHAB FACILITY °If the patient is transferred to a skilled rehab facility following release from the hospital, a list of the current medications will be sent to the facility for the patient to continue.  When discharged from the skilled rehab facility, please have the facility set up the patient's Home Health Physical Therapy prior to being released. Also, the skilled facility will be responsible for providing the patient with their medications at time of release from the facility to include their pain medication, the muscle relaxants, and their blood thinner medication. If the patient is still at the rehab facility at time of the two week follow up appointment, the skilled rehab facility will also need to assist the patient in arranging follow up appointment in our office and any transportation needs. ° °MAKE SURE YOU:  °Understand these instructions.  °Get help right away if you are not doing well or get worse.  ° ° °Pick up stool softner and laxative for home use following surgery while on pain medications. °Do not submerge incision under  water. °Please use good hand washing techniques while changing dressing each day. °May shower starting three days after surgery. °Please use a clean towel to pat the incision dry following showers. °Continue to use ice for pain and swelling after surgery. °Do not use any lotions or creams on the   incision until instructed by your surgeon. °

## 2019-06-11 NOTE — Anesthesia Postprocedure Evaluation (Signed)
Anesthesia Post Note  Patient: Douglas Lane  Procedure(s) Performed: TOTAL KNEE ARTHROPLASTY (Left )     Patient location during evaluation: PACU Anesthesia Type: Spinal Level of consciousness: oriented and awake and alert Pain management: pain level controlled Vital Signs Assessment: post-procedure vital signs reviewed and stable Respiratory status: spontaneous breathing, respiratory function stable and patient connected to nasal cannula oxygen Cardiovascular status: blood pressure returned to baseline and stable Postop Assessment: no headache, no backache and no apparent nausea or vomiting Anesthetic complications: no    Last Vitals:  Vitals:   06/11/19 1300 06/11/19 1337  BP: 131/67 (!) 153/75  Pulse: (!) 56 (!) 51  Resp: 11   Temp:    SpO2: 98% 98%    Last Pain:  Vitals:   06/11/19 1355  TempSrc:   PainSc: [place]

## 2019-06-11 NOTE — Transfer of Care (Signed)
Immediate Anesthesia Transfer of Care Note  Patient: Douglas Lane  Procedure(s) Performed: TOTAL KNEE ARTHROPLASTY (Left )  Patient Location: PACU  Anesthesia Type:Spinal and MAC combined with regional for post-op pain  Level of Consciousness: awake, drowsy and patient cooperative  Airway & Oxygen Therapy: Patient Spontanous Breathing and Patient connected to face mask oxygen  Post-op Assessment: Report given to RN and Post -op Vital signs reviewed and stable  Post vital signs: Reviewed and stable  Last Vitals:  Vitals Value Taken Time  BP 141/76 06/11/19 1145  Temp    Pulse 54 06/11/19 1146  Resp 12 06/11/19 1146  SpO2 96 % 06/11/19 1146  Vitals shown include unvalidated device data.  Last Pain:  Vitals:   06/11/19 0953  TempSrc:   PainSc: 0-No pain      Patients Stated Pain Goal: 4 ([id number])  Complications: No apparent anesthesia complications

## 2019-06-11 NOTE — Interval H&P Note (Signed)
History and Physical Interval Note:  06/11/2019 7:17 AM  Douglas Lane  has presented today for surgery, with the diagnosis of left knee osteoarthritis.  The various methods of treatment have been discussed with the patient and family. After consideration of risks, benefits and other options for treatment, the patient has consented to  Procedure(s) with comments: TOTAL KNEE ARTHROPLASTY (Left) - 34min as a surgical intervention.  The patient's history has been reviewed, patient examined, no change in status, stable for surgery.  I have reviewed the patient's chart and labs.  Questions were answered to the patient's satisfaction.     [name] [name]

## 2019-06-11 NOTE — Op Note (Signed)
OPERATIVE REPORT-TOTAL KNEE ARTHROPLASTY   Pre-operative diagnosis- Osteoarthritis  Left knee(s)  Post-operative diagnosis- Osteoarthritis Left knee(s)  Procedure-  Left  Total Knee Arthroplasty  Surgeon- Dione Plover. Kaisy Severino, MD  Assistant- Molli Barrows, PA-C   Anesthesia-  Adductor canal block and spinal  EBL- 25 ml   Drains Hemovac  Tourniquet time-  Total Tourniquet Time Documented: Thigh (Left) - 36 minutes Total: Thigh (Left) - 36 minutes     Complications- None  Condition-PACU - hemodynamically stable.   Brief Clinical Note   Douglas Lane is a 73 y.o. year old male with end stage OA of his left knee with progressively worsening pain and dysfunction. He has constant pain, with activity and at rest and significant functional deficits with difficulties even with ADLs. He has had extensive non-op management including analgesics, injections of cortisone and viscosupplements, and home exercise program, but remains in significant pain with significant dysfunction. Radiographs show bone on bone arthritis medial and patellofemoral with large varus deformity. He presents now for left Total Knee Arthroplasty.     Procedure in detail---   The patient is brought into the operating room and positioned supine on the operating table. After successful administration of  Adductor canal block and spinal,   a tourniquet is placed high on the  Left thigh(s) and the lower extremity is prepped and draped in the usual sterile fashion. Time out is performed by the operating team and then the  Left lower extremity is wrapped in Esmarch, knee flexed and the tourniquet inflated to 300 mmHg.       A midline incision is made with a ten blade through the subcutaneous tissue to the level of the extensor mechanism. A fresh blade is used to make a medial parapatellar arthrotomy. Soft tissue over the proximal medial tibia is subperiosteally elevated to the joint line with a knife and into the  semimembranosus bursa with a Cobb elevator. Soft tissue over the proximal lateral tibia is elevated with attention being paid to avoiding the patellar tendon on the tibial tubercle. The patella is everted, knee flexed 90 degrees and the ACL and PCL are removed. Findings are bone on bone medial and patellofemoral with large global osteophytes.        The drill is used to create a starting hole in the distal femur and the canal is thoroughly irrigated with sterile saline to remove the fatty contents. The 5 degree Left  valgus alignment guide is placed into the femoral canal and the distal femoral cutting block is pinned to remove 9 mm off the distal femur. Resection is made with an oscillating saw.      The tibia is subluxed forward and the menisci are removed. The extramedullary alignment guide is placed referencing proximally at the medial aspect of the tibial tubercle and distally along the second metatarsal axis and tibial crest. The block is pinned to remove 32mm off the more deficient medial  side. Resection is made with an oscillating saw. Size 7is the most appropriate size for the tibia and the proximal tibia is prepared with the modular drill and keel punch for that size.      The femoral sizing guide is placed and size 7 is most appropriate. Rotation is marked off the epicondylar axis and confirmed by creating a rectangular flexion gap at 90 degrees. The size 7 cutting block is pinned in this rotation and the anterior, posterior and chamfer cuts are made with the oscillating saw. The intercondylar block is then  placed and that cut is made.      Trial size 7 tibial component, trial size 7 posterior stabilized femur and a 10  mm posterior stabilized rotating platform insert trial is placed. Full extension is achieved with excellent varus/valgus and anterior/posterior balance throughout full range of motion. The patella is everted and thickness measured to be 27  mm. Free hand resection is taken to 15 mm, a  41 template is placed, lug holes are drilled, trial patella is placed, and it tracks normally. Osteophytes are removed off the posterior femur with the trial in place. All trials are removed and the cut bone surfaces prepared with pulsatile lavage. Cement is mixed and once ready for implantation, the size 7 tibial implant, size  7 posterior stabilized femoral component, and the size 41 patella are cemented in place and the patella is held with the clamp. The trial insert is placed and the knee held in full extension. The Exparel (20 ml mixed with 60 ml saline) is injected into the extensor mechanism, posterior capsule, medial and lateral gutters and subcutaneous tissues.  All extruded cement is removed and once the cement is hard the permanent 10 mm posterior stabilized rotating platform insert is placed into the tibial tray.      The wound is copiously irrigated with saline solution and the extensor mechanism closed over a hemovac drain with #1 V-loc suture. The tourniquet is released for a total tourniquet time of 36  minutes. Flexion against gravity is 140 degrees and the patella tracks normally. Subcutaneous tissue is closed with 2.0 vicryl and subcuticular with running 4.0 Monocryl. The incision is cleaned and dried and steri-strips and a bulky sterile dressing are applied. The limb is placed into a knee immobilizer and the patient is awakened and transported to recovery in stable condition.      Please note that a surgical assistant was a medical necessity for this procedure in order to perform it in a safe and expeditious manner. Surgical assistant was necessary to retract the ligaments and vital neurovascular structures to prevent injury to them and also necessary for proper positioning of the limb to allow for anatomic placement of the prosthesis.   Dione Plover Rilda Bulls, MD    06/11/2019, 11:15 AM

## 2019-06-12 ENCOUNTER — Encounter (HOSPITAL_COMMUNITY): Payer: Self-pay | Admitting: Orthopedic Surgery

## 2019-06-12 DIAGNOSIS — M1712 Unilateral primary osteoarthritis, left knee: Secondary | ICD-10-CM | POA: Diagnosis not present

## 2019-06-12 LAB — BASIC METABOLIC PANEL
Anion gap: 9 (ref 5–15)
BUN: 20 mg/dL (ref 8–23)
CO2: 24 mmol/L (ref 22–32)
Calcium: 8.5 mg/dL — ABNORMAL LOW (ref 8.9–10.3)
Chloride: 106 mmol/L (ref 98–111)
Creatinine, Ser: 0.97 mg/dL (ref 0.61–1.24)
GFR calc Af Amer: >60 mL/min (ref >60)
GFR calc non Af Amer: >60 mL/min (ref >60)
Glucose, Bld: 132 mg/dL — ABNORMAL HIGH (ref 70–99)
Potassium: 4.1 mmol/L (ref 3.5–5.1)
Sodium: 139 mmol/L (ref 135–145)

## 2019-06-12 LAB — CBC
HCT: 39.8 % (ref 39.0–52.0)
Hemoglobin: 12.5 g/dL — ABNORMAL LOW (ref 13.0–17.0)
MCH: 29.6 pg (ref 26.0–34.0)
MCHC: 31.4 g/dL (ref 30.0–36.0)
MCV: 94.3 fL (ref 80.0–100.0)
Platelets: 151 10*3/uL (ref 150–400)
RBC: 4.22 MIL/uL (ref 4.22–5.81)
RDW: 14 % (ref 11.5–15.5)
WBC: 10.7 10*3/uL — ABNORMAL HIGH (ref 4.0–10.5)
nRBC: 0 % (ref 0.0–0.2)

## 2019-06-12 MED ORDER — OXYCODONE HCL 5 MG PO TABS: 5.0000 mg | ORAL_TABLET | Freq: Four times a day (QID) | ORAL | 0 refills | Status: DC | PRN

## 2019-06-12 MED ORDER — GABAPENTIN 100 MG PO CAPS: 200.0000 mg | ORAL_CAPSULE | Freq: Three times a day (TID) | ORAL | 0 refills | Status: DC

## 2019-06-12 MED ORDER — SODIUM CHLORIDE 0.9 % IV BOLUS
250.0000 mL | Freq: Once | INTRAVENOUS | Status: AC
  Administered 2019-06-12: 250 mL via INTRAVENOUS

## 2019-06-12 MED ORDER — METHOCARBAMOL 500 MG PO TABS: 500.0000 mg | ORAL_TABLET | Freq: Four times a day (QID) | ORAL | 0 refills | Status: DC | PRN

## 2019-06-12 MED ORDER — ASPIRIN 325 MG PO TBEC: 325.0000 mg | DELAYED_RELEASE_TABLET | Freq: Two times a day (BID) | ORAL | 0 refills | Status: AC

## 2019-06-12 MED ORDER — TRAMADOL HCL 50 MG PO TABS: 50.0000 mg | ORAL_TABLET | Freq: Four times a day (QID) | ORAL | 0 refills | Status: DC | PRN

## 2019-06-12 NOTE — TOC Transition Note (Signed)
Transition of Care [facility]) - CM/SW Discharge Note   Patient Details  Name: Douglas Lane MRN: [id number] Date of Birth: [birth year]  [facility] CM/SW Contact:  Lia Hopping, [place] Phone Number: 06/12/2019, 9:41 AM   Clinical Narrative:       Final next level of care: OP Rehab Barriers to Discharge: No Barriers Identified   Patient Goals and CMS Choice     Choice offered to / list presented to : NA  Discharge Placement                       Discharge Plan and Services                DME Arranged: 3-N-1 DME Agency: [employer] Date DME Agency Contacted: 06/12/19 Time DME Agency Contacted: 0900 Representative spoke with at DME Agency: [place] ([place] Interventions     Readmission Risk Interventions No flowsheet data found.

## 2019-06-12 NOTE — Progress Notes (Signed)
Physical Therapy Treatment Patient Details Name: Douglas Lane MRN: [id number] DOB: 1945-10-15 Today's Date: 06/12/2019    History of Present Illness Patient is a 73 y.o. male s/p Lt TKA on 06/11/19 with PMH significant for siezures, chronic back pain, and Rt TKA on 07/24/18.    PT Comments    Pt making good progress.  Demonstrates mobility necessary to return home from PT perspective once discharged by physician.  ROM limited by pain. Pt required skilled therapy for progressing gait and exercises and will continue to benefit from PT while hospitalized.     Follow Up Recommendations  Follow surgeon's recommendation for DC plan and follow-up therapies     Equipment Recommendations  None recommended by PT    Recommendations for Other Services       Precautions / Restrictions Precautions Precautions: Fall Required Braces or Orthoses: Knee Immobilizer - Left Knee Immobilizer - Left: Discontinue once straight leg raise with < 10 degree lag Restrictions Weight Bearing Restrictions: No    Mobility  Bed Mobility Overal bed mobility: Needs Assistance Bed Mobility: Sit to Supine     Supine to sit: HOB elevated;Supervision Sit to supine: Supervision   General bed mobility comments: HOB elevated  Transfers Overall transfer level: Needs assistance Equipment used: Rolling walker (2 wheeled) Transfers: Sit to/from Stand Sit to Stand: Supervision         General transfer comment: no cues needed  Ambulation/Gait Ambulation/Gait assistance: Min guard Gait Distance (Feet): 200 Feet Assistive device: Rolling walker (2 wheeled) Gait Pattern/deviations: Decreased stance time - left Gait velocity: decreased   General Gait Details: step to L gait progressed to partial reciprocal with cues   Stairs Stairs: Yes Stairs assistance: Min guard Stair Management: Step to pattern Number of Stairs: 5 General stair comments: simiulated pt's post by having him reach to top of rail  on 1 side; cued for sequence   Wheelchair Mobility    Modified Rankin (Stroke Patients Only)       Balance Overall balance assessment: Needs assistance Sitting-balance support: No upper extremity supported;Feet supported Sitting balance-Leahy Scale: Good     Standing balance support: During functional activity;Bilateral upper extremity supported Standing balance-Leahy Scale: Fair                              Cognition Arousal/Alertness: Awake/alert Behavior During Therapy: WFL for tasks assessed/performed Overall Cognitive Status: Within Functional Limits for tasks assessed                                        Exercises Total Joint Exercises Ankle Circles/Pumps: AROM;15 reps;Seated;Both Quad Sets: AROM;Left;10 reps;Seated Towel Squeeze: AROM;Both;10 reps;Seated Short Arc Quad: AROM;Left;10 reps;Seated Heel Slides: AAROM;Supine;Left;10 reps Hip ABduction/ADduction: AAROM;Left;10 reps;Seated Straight Leg Raises: AROM;Left;10 reps;Seated Knee Flexion: AROM;Seated;10 reps;Left Goniometric ROM: L knee lacking 5 ext; 60 flex; limited by pain and ACE wrap    General Comments        Pertinent Vitals/Pain Pain Assessment: 0-10 Pain Score: 3  Pain Location: Lt knee Pain Descriptors / Indicators: Aching;Sore Pain Intervention(s): Limited activity within patient's tolerance;Utilized relaxation techniques;Ice applied    Home Living                      Prior Function            PT Goals (current goals can  now be found in the care plan section) Progress towards PT goals: Progressing toward goals    Frequency    7X/week      PT Plan Current plan remains appropriate    Co-evaluation              AM-PAC PT "6 Clicks" Mobility   Outcome Measure  Help needed turning from your back to your side while in a flat bed without using bedrails?: None Help needed moving from lying on your back to sitting on the side of a flat  bed without using bedrails?: None Help needed moving to and from a bed to a chair (including a wheelchair)?: None Help needed standing up from a chair using your arms (e.g., wheelchair or bedside chair)?: None Help needed to walk in hospital room?: None Help needed climbing 3-5 steps with a railing? : A Little 6 Click Score: 23    End of Session Equipment Utilized During Treatment: Gait belt Activity Tolerance: Patient tolerated treatment well Patient left: with call bell/phone within reach;in bed;with bed alarm set Nurse Communication: Mobility status PT Visit Diagnosis: Muscle weakness (generalized) (M62.81);Difficulty in walking, not elsewhere classified (R26.2);Unsteadiness on feet (R26.81)     Time: [id number] PT Time Calculation (min) (ACUTE ONLY): 23 min  Charges:  $Gait Training: 8-22 mins $Therapeutic Exercise: 8-22 mins                     Maggie Font, PT Acute Rehab Services [phone]    Karlton Lemon 06/12/2019, 1:59 PM

## 2019-06-12 NOTE — Progress Notes (Signed)
Physical Therapy Treatment Patient Details Name: Douglas Lane MRN: [id number] DOB: 12-26-1945 Today's Date: 06/12/2019    History of Present Illness Patient is a 73 y.o. male s/p Lt TKA on 06/11/19 with PMH significant for siezures, chronic back pain, and Rt TKA on 07/24/18.    PT Comments    Pt is POD #1 and progressing well.  He did need cues for stair technique and for exercises for correct form.  Demonstrated improved and safe gait.     Follow Up Recommendations  Follow surgeon's recommendation for DC plan and follow-up therapies     Equipment Recommendations  None recommended by PT    Recommendations for Other Services       Precautions / Restrictions Precautions Precautions: Fall Required Braces or Orthoses: Knee Immobilizer - Left Knee Immobilizer - Left: Discontinue once straight leg raise with < 10 degree lag Restrictions Weight Bearing Restrictions: No    Mobility  Bed Mobility Overal bed mobility: Needs Assistance       Supine to sit: HOB elevated;Supervision        Transfers Overall transfer level: Needs assistance Equipment used: Rolling walker (2 wheeled) Transfers: Sit to/from Stand Sit to Stand: Min guard         General transfer comment: cues to push up from bed  Ambulation/Gait Ambulation/Gait assistance: Min guard Gait Distance (Feet): 150 Feet Assistive device: Rolling walker (2 wheeled) Gait Pattern/deviations: Decreased stance time - left     General Gait Details: cues to keep RW close; step to L gait progressed to partial reciprocal with cues   Stairs Stairs: Yes Stairs assistance: Min guard Stair Management: Step to pattern Number of Stairs: 5 General stair comments: simiulated pt's post by having him reach to top of rail on 1 side; cued for sequence   Wheelchair Mobility    Modified Rankin (Stroke Patients Only)       Balance Overall balance assessment: Needs assistance Sitting-balance support: No upper  extremity supported;Feet supported Sitting balance-Leahy Scale: Good     Standing balance support: During functional activity;Bilateral upper extremity supported Standing balance-Leahy Scale: Fair                              Cognition Arousal/Alertness: Awake/alert Behavior During Therapy: WFL for tasks assessed/performed Overall Cognitive Status: Within Functional Limits for tasks assessed                                        Exercises Total Joint Exercises Ankle Circles/Pumps: AROM;15 reps;Seated;Both Quad Sets: AROM;Supine;Left;10 reps Towel Squeeze: AROM;Supine;Both;10 reps Short Arc Quad: AROM;Supine;Left;10 reps Heel Slides: AAROM;Supine;Left;10 reps Hip ABduction/ADduction: AAROM;Supine;Left;10 reps Straight Leg Raises: AROM;Supine;Left;10 reps(cued to breath) Knee Flexion: AROM;Seated;10 reps;Left(pt preferred over heel slides in bed; better pain tolerance) Goniometric ROM: L knee lacking 8 ext; 60 flex; limited by pain and ACE wrap    General Comments        Pertinent Vitals/Pain Pain Assessment: 0-10 Pain Score: 3  Pain Location: Lt knee Pain Descriptors / Indicators: Aching;Sore Pain Intervention(s): Limited activity within patient's tolerance;Premedicated before session;Repositioned;Ice applied    Home Living                      Prior Function            PT Goals (current goals can now be found in  the care plan section) Progress towards PT goals: Progressing toward goals    Frequency    7X/week      PT Plan Current plan remains appropriate    Co-evaluation              AM-PAC PT "6 Clicks" Mobility   Outcome Measure  Help needed turning from your back to your side while in a flat bed without using bedrails?: None Help needed moving from lying on your back to sitting on the side of a flat bed without using bedrails?: None Help needed moving to and from a bed to a chair (including a wheelchair)?:  A Little Help needed standing up from a chair using your arms (e.g., wheelchair or bedside chair)?: A Little Help needed to walk in hospital room?: A Little Help needed climbing 3-5 steps with a railing? : A Little 6 Click Score: 20    End of Session Equipment Utilized During Treatment: Gait belt Activity Tolerance: Patient tolerated treatment well Patient left: in chair;with call bell/phone within reach;with chair alarm set Nurse Communication: Mobility status PT Visit Diagnosis: Muscle weakness (generalized) (M62.81);Difficulty in walking, not elsewhere classified (R26.2);Unsteadiness on feet (R26.81)     Time: [id number] PT Time Calculation (min) (ACUTE ONLY): 32 min  Charges:  $Gait Training: 8-22 mins $Therapeutic Exercise: 8-22 mins                     Douglas Lane, PT Acute Rehab Services [phone]    Karlton Lemon 06/12/2019, 12:12 PM

## 2019-06-12 NOTE — Progress Notes (Signed)
Subjective: 1 Day Post-Op Procedure(s) (LRB): TOTAL KNEE ARTHROPLASTY (Left) Patient reports pain as mild.   Patient seen in rounds by Dr. Wynelle Link. Patient is well, and has had no acute complaints or problems other than discomfort in the left knee. No acute events overnight. Patient ambulated 20 feet with PT yesterday. Denies CP, SHOB.  We will continue therapy today.   Objective: Vital signs in last 24 hours: Temp:  [97.5 F (36.4 C)-98.6 F (37 C)] 97.7 F (36.5 C) (10/27 [id number]) Pulse Rate:  [50-88] 68 (10/27 0638) Resp:  [7-21] 14 (10/27 0638) BP: (105-164)/(65-104) 105/65 (10/27 0638) SpO2:  [92 %-100 %] 97 % (10/27 [id number]) Weight:  [86.2 kg] 86.2 kg (10/26 0740)  Intake/Output from previous day:  Intake/Output Summary (Last 24 hours) at 06/12/2019 0728 Last data filed at 06/12/2019 0637 Gross per 24 hour  Intake 6350.79 ml  Output 936 ml  Net 5414.79 ml     Intake/Output this shift: No intake/output data recorded.  Labs: Recent Labs    06/12/19 0302  HGB 12.5*   Recent Labs    06/12/19 0302  WBC 10.7*  RBC 4.22  HCT 39.8  PLT 151   Recent Labs    06/12/19 0302  NA 139  K 4.1  CL 106  CO2 24  BUN 20  CREATININE 0.97  GLUCOSE 132*  CALCIUM 8.5*   No results for input(s): LABPT, INR in the last 72 hours.  Exam: General - Patient is Alert and Oriented Extremity - Neurologically intact Sensation intact distally Intact pulses distally Dorsiflexion/Plantar flexion intact Dressing - dressing C/D/I Motor Function - intact, moving foot and toes well on exam.   Past Medical History:  Diagnosis Date  . Aortic atherosclerosis ([place]) 03/08/2017   CT  . Aortic stenosis    severe AS s/p TAVR (03/22/17) with a 20mm Edwards Sapien 3 THV via the TF approach  . Arthritis    "pain in my knees" (03/30/2016)  . Bilateral cataracts   . CAD S/P percutaneous coronary angioplasty 03/31/2016   a. CP/USA -> LHC 03/31/16: 90% prox-mid RCA s/p DES, 40% prox-mid LAD,  25% D2, 25% prox-mid Cx, elevated LVEDP 33.  . Chronic diastolic CHF (congestive heart failure) ([place]) 03/31/2016   pt unaware, has family history  . Chronic lower back pain   . DDD (degenerative disc disease), lumbar   . Dyslipidemia   . Grade II diastolic dysfunction   . Hearing loss   . Heart murmur   . Heartburn    occ. takes TUMS  . Humeral fracture 07/2014   Right  . Kidney cysts    1.6 cm simple cyst right  . LVH (left ventricular hypertrophy)    a. severe basal septal hypertrophy by echo 03/2016.  . Mitral regurgitation    a. mild by echo 03/2016.  . Osteopenia 03/13/2009  . Psoriasis   . Rheumatic fever    as a child  . Scoliosis 03/13/2009  . 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 07/2015, absent seizure  . Sinus bradycardia - baseline HR 50s at times.   . 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 Thrombosed hemorrhoids     Assessment/Plan: 1 Day Post-Op Procedure(s) (LRB): TOTAL KNEE ARTHROPLASTY (Left) Active Problems:   OA (osteoarthritis) of knee  Estimated body mass index is 30.67 kg/m as calculated from the following:   Height as of this encounter: 5'  6" (1.676 m).   Weight as of this encounter: 86.2 kg. Advance diet Up with therapy  Anticipated LOS equal to or greater than 2 midnights due to - Age 75 and older with one or more of the following:  - Obesity  - Expected need for hospital services (PT, OT, Nursing) required for safe  discharge  - Anticipated need for postoperative skilled nursing care or inpatient rehab  - Active co-morbidities: None OR   - Unanticipated findings during/Post Surgery: None  - Patient is a high risk of re-admission due to: None    DVT Prophylaxis - Aspirin Weight bearing as tolerated. D/C O2 and pulse ox and try on room air. Hemovac pulled without difficulty, will begin therapy today.  Plan is to go Home after hospital stay.  Decreased urine output, ordered 250 cc bolus. Possible discharge today following 1-2 sessions of therapy pending he is progressing and meeting goals with PT. Scheduled for OPPT. Follow up in the office in 2 weeks.   Griffith Citron, PA-C Orthopedic Surgery [phone] 06/12/2019, 7:28 AM

## 2019-06-12 NOTE — Plan of Care (Signed)
Pt ready for DC home 

## 2019-06-18 ENCOUNTER — Other Ambulatory Visit: Payer: Self-pay

## 2019-06-18 ENCOUNTER — Ambulatory Visit: Payer: Medicare Other | Attending: Orthopedic Surgery | Admitting: Physical Therapy

## 2019-06-18 ENCOUNTER — Encounter: Payer: Self-pay | Admitting: Physical Therapy

## 2019-06-18 DIAGNOSIS — M25562 Pain in left knee: Secondary | ICD-10-CM | POA: Insufficient documentation

## 2019-06-18 DIAGNOSIS — M25662 Stiffness of left knee, not elsewhere classified: Secondary | ICD-10-CM | POA: Insufficient documentation

## 2019-06-18 DIAGNOSIS — R262 Difficulty in walking, not elsewhere classified: Secondary | ICD-10-CM | POA: Insufficient documentation

## 2019-06-18 DIAGNOSIS — R600 Localized edema: Secondary | ICD-10-CM | POA: Insufficient documentation

## 2019-06-18 NOTE — Therapy (Signed)
[address] [place], [place], [address] Phone: [phone]   Fax:  [phone]  Physical Therapy Evaluation  Patient Details  Name: Douglas Lane MRN: [id number] Date of Birth: 12/01/1945 Referring Provider (PT): Aluisio   Encounter Date: 06/18/2019  PT End of Session - 06/18/19 1134    Visit Number  1    Date for PT Re-Evaluation  08/18/19    Authorization Type  [employer]       Past Medical History:  Diagnosis Date  . Aortic atherosclerosis ([place]) 03/08/2017   CT  . Aortic stenosis    severe AS s/p TAVR (03/22/17) with a 84mm Edwards Sapien 3 THV via the TF approach  . Arthritis    "pain in my knees" (03/30/2016)  . Bilateral cataracts   . CAD S/P percutaneous coronary angioplasty 03/31/2016   a. CP/USA -> LHC 03/31/16: 90% prox-mid RCA s/p DES, 40% prox-mid LAD, 25% D2, 25% prox-mid Cx, elevated LVEDP 33.  . Chronic diastolic CHF (congestive heart failure) ([place]) 03/31/2016   pt unaware, has family history  . Chronic lower back pain   . DDD (degenerative disc disease), lumbar   . Dyslipidemia   . Grade II diastolic dysfunction   . Hearing loss   . Heart murmur   . Heartburn    occ. takes TUMS  . Humeral fracture 07/2014   Right  . Kidney cysts    1.6 cm simple cyst right  . LVH (left ventricular hypertrophy)    a. severe basal septal hypertrophy by echo 03/2016.  . Mitral regurgitation    a. mild by echo 03/2016.  . Osteopenia 03/13/2009  . Psoriasis   . Rheumatic fever    as a child  . Scoliosis 03/13/2009  . 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 07/2015, absent seizure  . Sinus bradycardia - baseline HR 50s at times.   . 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 Thrombosed hemorrhoids     Past Surgical History:  Procedure Laterality Date  . APPENDECTOMY    . BACK SURGERY    . CARDIAC  CATHETERIZATION N/A 03/30/2016   Procedure: Right/Left Heart Cath and Coronary Angiography;  Surgeon: Jettie Booze, MD;  Location: [facility];  Service: Cardiovascular;  Laterality: N/A;  . CARDIAC CATHETERIZATION N/A 03/30/2016   Procedure: Coronary Stent Intervention;  Surgeon: Jettie Booze, MD;  Location: [facility];  Service: Cardiovascular;  Laterality: N/A;  . CATARACT EXTRACTION, BILATERAL    . COLONOSCOPY    . CORONARY ANGIOPLASTY    . FOREARM FRACTURE SURGERY Right ~ 2003-2004   "got steel plate in there"  . HERNIA REPAIR  2009  . I&D hemorrhoirds    . KNEE ARTHROSCOPY Bilateral   . MOHS SURGERY Left    temple  . ORIF SCAPULAR FRACTURE Right    right  . POSTERIOR LUMBAR FUSION  2004  . TEE WITHOUT CARDIOVERSION N/A 11/01/2016   Procedure: TRANSESOPHAGEAL ECHOCARDIOGRAM (TEE);  Surgeon: Sanda Klein, MD;  Location: [facility];  Service: Cardiovascular;  Laterality: N/A;  . TEE WITHOUT CARDIOVERSION N/A 03/22/2017   Procedure: TRANSESOPHAGEAL ECHOCARDIOGRAM (TEE);  Surgeon: Sherren Mocha, MD;  Location: [facility];  Service: Open Heart Surgery;  Laterality: N/A;  . TONSILLECTOMY    . TOTAL KNEE ARTHROPLASTY Right 07/24/2018   Procedure: RIGHT TOTAL KNEE ARTHROPLASTY;  Surgeon: Wynelle Link,  [name], MD;  Location: [facility];  Service: Orthopedics;  Laterality: Right;  74min  . TOTAL KNEE ARTHROPLASTY Left 06/11/2019   Procedure: TOTAL KNEE ARTHROPLASTY;  Surgeon: Gaynelle Arabian, MD;  Location: [facility];  Service: Orthopedics;  Laterality: Left;  86min  . TRANSCATHETER AORTIC VALVE REPLACEMENT, TRANSFEMORAL N/A 03/22/2017   Procedure: TRANSCATHETER AORTIC VALVE REPLACEMENT, TRANSFEMORAL;  Surgeon: Sherren Mocha, MD;  Location: [facility];  Service: Open Heart Surgery;  Laterality: N/A;    There were no vitals filed for this visit.   Subjective Assessment - 06/18/19 1104    Subjective  Patient underwent a left TKA on 06/11/19.  He reports that he is hurting and had a  lot of bruising in the mid to upper thigh.    Pertinent History  had right TKA December 2019    Limitations  Lifting;Standing;Walking;House hold activities    Patient Stated Goals  have less pain, better motions, walk better    Currently in Pain?  Yes    Pain Score  5     Pain Location  Knee    Pain Orientation  Left    Pain Descriptors / Indicators  Aching;Constant    Pain Type  Acute pain;Surgical pain    Pain Onset  In the past 7 days    Pain Frequency  Constant    Aggravating Factors   standing, wallking, bending pain up to 10/10    Pain Relieving Factors  pain meds, rest and ice at best pain will be 2-3/10    Effect of Pain on Daily Activities  limits all ADL's         [facility] PT Assessment - 06/18/19 0001      Assessment   Medical Diagnosis  left TKA    Referring Provider (PT)  Aluisio    Onset Date/Surgical Date  06/11/19    Prior Therapy  for the right TKA a year ago      Precautions   Precautions  None      Balance Screen   Has the patient fallen in the past 6 months  No    Has the patient had a decrease in activity level because of a fear of falling?   No    Is the patient reluctant to leave their home because of a fear of falling?   No      Home Environment   Additional Comments  has stairs, in the past does housework, Haematologist, Forensic psychologist      Prior Function   Level of Independence  Independent    Vocation  Retired    Leisure  no exercise      Circumferential Edema   Circumferential - Right  41.5 cm mid patella    Circumferential - Left   49 cm      AROM   Overall AROM Comments  pain with motions    Right/Left Knee  Left    Left Knee Extension  12    Left Knee Flexion  82      PROM   Overall PROM Comments  very painful with flexion    Right/Left Knee  Left    Left Knee Extension  8    Left Knee Flexion  88      Strength   Overall Strength Comments  3+/5 with pain      Palpation   Palpation comment  swelling, significant purple ecchymosis of  the mid to upper thigh      Ambulation/Gait   Gait Comments  with FWW, slow, very stiff leg                Objective measurements completed on examination: See above findings.      [facility] Adult PT Treatment/Exercise - 06/18/19 0001      Knee/Hip Exercises: Aerobic   Nustep  level 4 x 6 minutes      Vasopneumatic   Number Minutes Vasopneumatic   10 minutes    Vasopnuematic Location   Knee    Vasopneumatic Pressure  Medium    Vasopneumatic Temperature   35             PT Education - 06/18/19 1134    Education Details  low load long duration stretch for flexion and extension    Person(s) Educated  Patient    Methods  Explanation;Demonstration;Handout    Comprehension  Verbalized understanding;Returned demonstration;Verbal cues required       PT Short Term Goals - 06/18/19 1137      PT SHORT TERM GOAL #1   Title  independent with intiial HEP    Time  2    Period  Weeks    Status  New        PT Long Term Goals - 06/18/19 1137      PT LONG TERM GOAL #1   Title  ambulate community distances without device or with SPC    Time  12    Period  Weeks    Status  New      PT LONG TERM GOAL #2   Title  decrease pain 50%    Time  12    Period  Weeks    Status  New      PT LONG TERM GOAL #3   Title  increase AROM to 3-115 degrees flexion    Time  12    Period  Weeks    Status  New      PT LONG TERM GOAL #4   Title  increase strength to 4+/5    Time  12    Period  Weeks    Status  New      PT LONG TERM GOAL #5   Title  decrease swelling 2 cm    Time  12    Period  Weeks    Status  New             Plan - 06/18/19 1135    Clinical Impression Statement  Patient underwent a left TKA on 06/11/19.  He has significant purple ecchymosis in the mid to upper thigh.  He is very stiff and yells out in pain with movements, AROM was 12-88 degrees flexion.  Wallks with a FWW, slow and very stiff legged.    Stability/Clinical Decision Making   Evolving/Moderate complexity    Clinical Decision Making  Low    Rehab Potential  Good    PT Frequency  3x / week    PT Duration  8 weeks    PT Treatment/Interventions  ADLs/Self Care Home Management;Electrical Stimulation;Gait training;Stair training;Functional mobility training;Therapeutic activities;Therapeutic exercise;Patient/family education;Neuromuscular re-education;Balance training;Manual techniques;Vasopneumatic Device;Passive range of motion    PT Next Visit Plan  continue to work on ROM, strnegth, gait and function as well as his swelling    Consulted and Agree with Plan of Care  Patient       Patient will benefit from skilled therapeutic intervention in order to improve the following deficits and impairments:  Abnormal gait, Pain, Decreased scar mobility, Cardiopulmonary status  limiting activity, Decreased activity tolerance, Decreased range of motion, Decreased strength, Decreased balance, Difficulty walking, Increased edema, Impaired flexibility  Visit Diagnosis: Acute pain of left knee - Plan: PT plan of care cert/re-cert  Stiffness of left knee, not elsewhere classified - Plan: PT plan of care cert/re-cert  Difficulty in walking, not elsewhere classified - Plan: PT plan of care cert/re-cert  Localized edema - Plan: PT plan of care cert/re-cert     Problem List Patient Active Problem List   Diagnosis Date Noted  . OA (osteoarthritis) of knee 07/24/2018  . Chronic diastolic heart failure ([place]) 11/13/2017  . Benign essential HTN 11/13/2017  . S/P TAVR (transcatheter aortic valve replacement) 03/22/2017  . Severe aortic stenosis 03/22/2017  . Bradycardia, drug induced 10/28/2016  . Dyslipidemia 03/31/2016  . Mitral regurgitation 03/31/2016  . CAD S/P percutaneous coronary angioplasty 03/31/2016  . Abnormal nuclear stress test   . Heart murmur 03/06/2015  . Abnormal EKG 03/06/2015  . LVH (left ventricular hypertrophy) 03/06/2015  . Rheumatic fever   .  Localization-related idiopathic epilepsy and epileptic syndromes with seizures of localized onset, not intractable, without status epilepticus ([place]) 08/23/2014  . Hemorrhoid, thrombosed. 12/24/2011    Sumner Boast., PT 06/18/2019, 11:46 AM  [facility] [place], [place], [address] Phone: [phone]   Fax:  [phone]  Name: Douglas Lane MRN: [id number] Date of Birth: August 30, 1945

## 2019-06-20 ENCOUNTER — Ambulatory Visit: Payer: Medicare Other | Admitting: Physical Therapy

## 2019-06-20 ENCOUNTER — Other Ambulatory Visit: Payer: Self-pay

## 2019-06-20 DIAGNOSIS — M25562 Pain in left knee: Secondary | ICD-10-CM | POA: Diagnosis not present

## 2019-06-20 DIAGNOSIS — M25662 Stiffness of left knee, not elsewhere classified: Secondary | ICD-10-CM

## 2019-06-20 NOTE — Therapy (Signed)
[address] [place], [place], [address] Phone: [phone]   Fax:  [phone]  Physical Therapy Treatment  Patient Details  Name: Douglas Lane MRN: [id number] Date of Birth: Jan 01, 1946 Referring Provider (PT): Aluisio   Encounter Date: 06/20/2019  PT End of Session - 06/20/19 1008    Visit Number  2    PT Start Time  0930    PT Stop Time  [id number]    PT Time Calculation (min)  48 min       Past Medical History:  Diagnosis Date  . Aortic atherosclerosis ([place]) 03/08/2017   CT  . Aortic stenosis    severe AS s/p TAVR (03/22/17) with a 62mm Edwards Sapien 3 THV via the TF approach  . Arthritis    "pain in my knees" (03/30/2016)  . Bilateral cataracts   . CAD S/P percutaneous coronary angioplasty 03/31/2016   a. CP/[place] -> LHC 03/31/16: 90% prox-mid RCA s/p DES, 40% prox-mid LAD, 25% D2, 25% prox-mid Cx, elevated LVEDP 33.  . Chronic diastolic CHF (congestive heart failure) ([place]) 03/31/2016   pt unaware, has family history  . Chronic lower back pain   . DDD (degenerative disc disease), lumbar   . Dyslipidemia   . Grade II diastolic dysfunction   . Hearing loss   . Heart murmur   . Heartburn    occ. takes TUMS  . Humeral fracture 07/2014   Right  . Kidney cysts    1.6 cm simple cyst right  . LVH (left ventricular hypertrophy)    a. severe basal septal hypertrophy by echo 03/2016.  . Mitral regurgitation    a. mild by echo 03/2016.  . Osteopenia 03/13/2009  . Psoriasis   . Rheumatic fever    as a child  . Scoliosis 03/13/2009  . Seizure ([place]) 01/2011; 07/2011; 07/2015   "idiopathic; been on Kepra since 07/2011", last seizure poss. 07/2015, absent seizure  . Sinus bradycardia - baseline HR 50s at times.   . 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 Thrombosed hemorrhoids     Past Surgical History:  Procedure Laterality Date  . APPENDECTOMY    . BACK SURGERY    . CARDIAC  CATHETERIZATION N/A 03/30/2016   Procedure: Right/Left Heart Cath and Coronary Angiography;  Surgeon: Jettie Booze, MD;  Location: [facility];  Service: Cardiovascular;  Laterality: N/A;  . CARDIAC CATHETERIZATION N/A 03/30/2016   Procedure: Coronary Stent Intervention;  Surgeon: Jettie Booze, MD;  Location: [facility];  Service: Cardiovascular;  Laterality: N/A;  . CATARACT EXTRACTION, BILATERAL    . COLONOSCOPY    . CORONARY ANGIOPLASTY    . FOREARM FRACTURE SURGERY Right ~ 2003-2004   "got steel plate in there"  . HERNIA REPAIR  2009  . I&D hemorrhoirds    . KNEE ARTHROSCOPY Bilateral   . MOHS SURGERY Left    temple  . ORIF SCAPULAR FRACTURE Right    right  . POSTERIOR LUMBAR FUSION  2004  . TEE WITHOUT CARDIOVERSION N/A 11/01/2016   Procedure: TRANSESOPHAGEAL ECHOCARDIOGRAM (TEE);  Surgeon: Sanda Klein, MD;  Location: [facility];  Service: Cardiovascular;  Laterality: N/A;  . TEE WITHOUT CARDIOVERSION N/A 03/22/2017   Procedure: TRANSESOPHAGEAL ECHOCARDIOGRAM (TEE);  Surgeon: Sherren Mocha, MD;  Location: [facility];  Service: Open Heart Surgery;  Laterality: N/A;  . TONSILLECTOMY    . TOTAL KNEE ARTHROPLASTY Right 07/24/2018  Procedure: RIGHT TOTAL KNEE ARTHROPLASTY;  Surgeon: Gaynelle Arabian, MD;  [facility];  Service: Orthopedics;  Laterality: Right;  65min  . TOTAL KNEE ARTHROPLASTY Left 06/11/2019   Procedure: TOTAL KNEE ARTHROPLASTY;  Surgeon: Gaynelle Arabian, MD;  [facility];  Service: Orthopedics;  Laterality: Left;  49min  . TRANSCATHETER AORTIC VALVE REPLACEMENT, TRANSFEMORAL N/A 03/22/2017   Procedure: TRANSCATHETER AORTIC VALVE REPLACEMENT, TRANSFEMORAL;  Surgeon: Sherren Mocha, MD;  Location: [facility];  Service: Open Heart Surgery;  Laterality: N/A;    There were no vitals filed for this visit.  Subjective Assessment - 06/20/19 0938    Subjective  "pretty pain free morning". pt states he is ready to get rid of the walker.                        [facility] Adult PT Treatment/Exercise - 06/20/19 0001      Knee/Hip Exercises: Aerobic   Nustep  level 4 x 6 minutes      Knee/Hip Exercises: Machines for Strengthening   Cybex Leg Press  30# 15 reps      Knee/Hip Exercises: Seated   Long Arc Quad  Strengthening;AROM;Right;20 reps;Weights   2#   Hamstring Curl  Strengthening;AROM;20 reps;Right    Hamstring Limitations  red tband      Knee/Hip Exercises: Supine   Short Arc Quad Sets  20 reps;Strengthening;AROM;Right   quad set     Vasopneumatic   Number Minutes Vasopneumatic   10 minutes    Vasopnuematic Location   Knee    Vasopneumatic Pressure  Medium    Vasopneumatic Temperature   35      Manual Therapy   Manual Therapy  Passive ROM    Passive ROM  flexion and extension               PT Short Term Goals - 06/18/19 1137      PT SHORT TERM GOAL #1   Title  independent with intiial HEP    Time  2    Period  Weeks    Status  New        PT Long Term Goals - 06/18/19 1137      PT LONG TERM GOAL #1   Title  ambulate community distances without device or with SPC    Time  12    Period  Weeks    Status  New      PT LONG TERM GOAL #2   Title  decrease pain 50%    Time  12    Period  Weeks    Status  New      PT LONG TERM GOAL #3   Title  increase AROM to 3-115 degrees flexion    Time  12    Period  Weeks    Status  New      PT LONG TERM GOAL #4   Title  increase strength to 4+/5    Time  12    Period  Weeks    Status  New      PT LONG TERM GOAL #5   Title  decrease swelling 2 cm    Time  12    Period  Weeks    Status  New            Plan - 06/20/19 1025    Clinical Impression Statement  Pt tolerated treatment well as demonstrated by no increase in pain during interventions. Pt needs tactile cues with  quad sets to tighten muscles. pt able to use  leg press w/ 30#. pt needs cues to relax with PROM.       Patient will benefit from skilled therapeutic  intervention in order to improve the following deficits and impairments:  Abnormal gait, Pain, Decreased scar mobility, Cardiopulmonary status limiting activity, Decreased activity tolerance, Decreased range of motion, Decreased strength, Decreased balance, Difficulty walking, Increased edema, Impaired flexibility  Visit Diagnosis: Stiffness of left knee, not elsewhere classified     Problem List Patient Active Problem List   Diagnosis Date Noted  . OA (osteoarthritis) of knee 07/24/2018  . Chronic diastolic heart failure ([place]) 11/13/2017  . Benign essential HTN 11/13/2017  . S/P TAVR (transcatheter aortic valve replacement) 03/22/2017  . Severe aortic stenosis 03/22/2017  . Bradycardia, drug induced 10/28/2016  . Dyslipidemia 03/31/2016  . Mitral regurgitation 03/31/2016  . CAD S/P percutaneous coronary angioplasty 03/31/2016  . Abnormal nuclear stress test   . Heart murmur 03/06/2015  . Abnormal EKG 03/06/2015  . LVH (left ventricular hypertrophy) 03/06/2015  . Rheumatic fever   . Localization-related idiopathic epilepsy and epileptic syndromes with seizures of localized onset, not intractable, without status epilepticus ([place]) 08/23/2014  . Hemorrhoid, thrombosed. 12/24/2011    Barrett Henle, [place] 06/20/2019, 10:30 AM  [facility] [address] [place], [place], [address] Phone: [phone]   Fax:  [phone]  Name: Douglas Lane MRN: [id number] Date of Birth: [birth year]

## 2019-06-22 ENCOUNTER — Ambulatory Visit: Payer: Medicare Other | Admitting: Physical Therapy

## 2019-06-22 ENCOUNTER — Other Ambulatory Visit: Payer: Self-pay

## 2019-06-22 DIAGNOSIS — R600 Localized edema: Secondary | ICD-10-CM

## 2019-06-22 DIAGNOSIS — M25562 Pain in left knee: Secondary | ICD-10-CM | POA: Diagnosis not present

## 2019-06-22 DIAGNOSIS — R262 Difficulty in walking, not elsewhere classified: Secondary | ICD-10-CM

## 2019-06-22 DIAGNOSIS — M25662 Stiffness of left knee, not elsewhere classified: Secondary | ICD-10-CM

## 2019-06-22 NOTE — Therapy (Signed)
[facility] [place], [place], [address] Phone: [phone]   Fax:  [phone]  Physical Therapy Treatment  Patient Details  Name: Douglas Lane MRN: [id number] Date of Birth: [birth year] Referring Provider (PT): Aluisio   Encounter Date: 06/22/2019  PT End of Session - 06/22/19 1058    Visit Number  3    Date for PT Re-Evaluation  08/18/19    PT Start Time  [id number]    PT Stop Time  1108    PT Time Calculation (min)  53 min       Past Medical History:  Diagnosis Date  . Aortic atherosclerosis ([place]) 03/08/2017   CT  . Aortic stenosis    severe AS s/p TAVR (03/22/17) with a 52mm Edwards Sapien 3 THV via the TF approach  . Arthritis    "pain in my knees" (03/30/2016)  . Bilateral cataracts   . CAD S/P percutaneous coronary angioplasty 03/31/2016   a. CP/USA -> LHC 03/31/16: 90% prox-mid RCA s/p DES, 40% prox-mid LAD, 25% D2, 25% prox-mid Cx, elevated LVEDP 33.  . Chronic diastolic CHF (congestive heart failure) ([place]) 03/31/2016   pt unaware, has family history  . Chronic lower back pain   . DDD (degenerative disc disease), lumbar   . Dyslipidemia   . Grade II diastolic dysfunction   . Hearing loss   . Heart murmur   . Heartburn    occ. takes TUMS  . Humeral fracture 07/2014   Right  . Kidney cysts    1.6 cm simple cyst right  . LVH (left ventricular hypertrophy)    a. severe basal septal hypertrophy by echo 03/2016.  . Mitral regurgitation    a. mild by echo 03/2016.  . Osteopenia 03/13/2009  . Psoriasis   . Rheumatic fever    as a child  . Scoliosis 03/13/2009  . Seizure ([place]) 01/2011; 07/2011; 07/2015   "idiopathic; been on Kepra since 07/2011", last seizure poss. 07/2015, absent seizure  . Sinus bradycardia - baseline HR 50s at times.   . 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 Thrombosed hemorrhoids     Past Surgical History:  Procedure Laterality Date  .  APPENDECTOMY    . BACK SURGERY    . CARDIAC CATHETERIZATION N/A 03/30/2016   Procedure: Right/Left Heart Cath and Coronary Angiography;  Surgeon: Jettie Booze, MD;  Location: [facility];  Service: Cardiovascular;  Laterality: N/A;  . CARDIAC CATHETERIZATION N/A 03/30/2016   Procedure: Coronary Stent Intervention;  Surgeon: Jettie Booze, MD;  Location: [facility];  Service: Cardiovascular;  Laterality: N/A;  . CATARACT EXTRACTION, BILATERAL    . COLONOSCOPY    . CORONARY ANGIOPLASTY    . FOREARM FRACTURE SURGERY Right ~ 2003-2004   "got steel plate in there"  . HERNIA REPAIR  2009  . I&D hemorrhoirds    . KNEE ARTHROSCOPY Bilateral   . MOHS SURGERY Left    temple  . ORIF SCAPULAR FRACTURE Right    right  . POSTERIOR LUMBAR FUSION  2004  . TEE WITHOUT CARDIOVERSION N/A 11/01/2016   Procedure: TRANSESOPHAGEAL ECHOCARDIOGRAM (TEE);  Surgeon: Sanda Klein, MD;  Location: [facility];  Service: Cardiovascular;  Laterality: N/A;  . TEE WITHOUT CARDIOVERSION N/A 03/22/2017   Procedure: TRANSESOPHAGEAL ECHOCARDIOGRAM (TEE);  Surgeon: Sherren Mocha, MD;  Location: [facility];  Service: Open Heart Surgery;  Laterality: N/A;  . TONSILLECTOMY    .  TOTAL KNEE ARTHROPLASTY Right 07/24/2018   Procedure: RIGHT TOTAL KNEE ARTHROPLASTY;  Surgeon: Gaynelle Arabian, MD;  [facility];  Service: Orthopedics;  Laterality: Right;  21min  . TOTAL KNEE ARTHROPLASTY Left 06/11/2019   Procedure: TOTAL KNEE ARTHROPLASTY;  Surgeon: Gaynelle Arabian, MD;  [facility];  Service: Orthopedics;  Laterality: Left;  45min  . TRANSCATHETER AORTIC VALVE REPLACEMENT, TRANSFEMORAL N/A 03/22/2017   Procedure: TRANSCATHETER AORTIC VALVE REPLACEMENT, TRANSFEMORAL;  Surgeon: Sherren Mocha, MD;  Location: [facility];  Service: Open Heart Surgery;  Laterality: N/A;    There were no vitals filed for this visit.  Subjective Assessment - 06/22/19 1014    Subjective  amb in with SPC. pt very stiff but less  pain    Currently in Pain?  Yes    Pain Score  4     Pain Location  Knee    Pain Orientation  Left         [facility] PT Assessment - 06/22/19 0001      AROM   Right/Left Knee  Left    Left Knee Extension  5    Left Knee Flexion  104                   [facility] Adult PT Treatment/Exercise - 06/22/19 0001      Knee/Hip Exercises: Aerobic   Recumbent Bike  5 min full rev seat # 7    Nustep  level 5 x 6 minutes      Knee/Hip Exercises: Machines for Strengthening   Cybex Knee Extension  fitter 2 blue 10 reps    Cybex Knee Flexion  fitter 2 blue 10 reps    Cybex Leg Press  30# 2 sets 10   calf raises 2 sets10     Knee/Hip Exercises: Seated   Long Arc Quad  Strengthening;Left;20 reps   yellow tband   Hamstring Curl  Strengthening;AROM;20 reps;Right    Hamstring Limitations  red tband      Knee/Hip Exercises: Supine   Short Arc Quad Sets  20 reps;Strengthening;AROM;Right      Vasopneumatic   Number Minutes Vasopneumatic   15 minutes    Vasopnuematic Location   Knee    Vasopneumatic Pressure  Medium    Vasopneumatic Temperature   35      Manual Therapy   Manual Therapy  Passive ROM    Passive ROM  flexion and extension             PT Education - 06/22/19 1057    Education Details  ice and elevaion above the heart to help with swelling    Person(s) Educated  Patient    Methods  Explanation;Demonstration    Comprehension  Verbalized understanding       PT Short Term Goals - 06/22/19 1101      PT SHORT TERM GOAL #1   Title  independent with intiial HEP    Status  Achieved        PT Long Term Goals - 06/18/19 1137      PT LONG TERM GOAL #1   Title  ambulate community distances without device or with SPC    Time  12    Period  Weeks    Status  New      PT LONG TERM GOAL #2   Title  decrease pain 50%    Time  12    Period  Weeks    Status  New      PT  LONG TERM GOAL #3   Title  increase AROM to 3-115 degrees flexion    Time  12    Period   Weeks    Status  New      PT LONG TERM GOAL #4   Title  increase strength to 4+/5    Time  12    Period  Weeks    Status  New      PT LONG TERM GOAL #5   Title  decrease swelling 2 cm    Time  12    Period  Weeks    Status  New            Plan - 06/22/19 1058    Clinical Impression Statement  siginicant increase in AROM after MT and SAQ with PTA facsiliating quads activation. pt tolerated new ther ex and interventions well.    PT Treatment/Interventions  ADLs/Self Care Home Management;Electrical Stimulation;Gait training;Stair training;Functional mobility training;Therapeutic activities;Therapeutic exercise;Patient/family education;Neuromuscular re-education;Balance training;Manual techniques;Vasopneumatic Device;Passive range of motion    PT Next Visit Plan  continue to work on ROM, strnegth, gait and function as well as his swelling. okayed dressing removal as steri strips are intatc , no drainage ,instrcuted not to get wet       Patient will benefit from skilled therapeutic intervention in order to improve the following deficits and impairments:  Abnormal gait, Pain, Decreased scar mobility, Cardiopulmonary status limiting activity, Decreased activity tolerance, Decreased range of motion, Decreased strength, Decreased balance, Difficulty walking, Increased edema, Impaired flexibility  Visit Diagnosis: Stiffness of left knee, not elsewhere classified  Acute pain of left knee  Difficulty in walking, not elsewhere classified  Localized edema     Problem List Patient Active Problem List   Diagnosis Date Noted  . OA (osteoarthritis) of knee 07/24/2018  . Chronic diastolic heart failure ([place]) 11/13/2017  . Benign essential HTN 11/13/2017  . S/P TAVR (transcatheter aortic valve replacement) 03/22/2017  . Severe aortic stenosis 03/22/2017  . Bradycardia, drug induced 10/28/2016  . Dyslipidemia 03/31/2016  . Mitral regurgitation 03/31/2016  . CAD S/P percutaneous  coronary angioplasty 03/31/2016  . Abnormal nuclear stress test   . Heart murmur 03/06/2015  . Abnormal EKG 03/06/2015  . LVH (left ventricular hypertrophy) 03/06/2015  . Rheumatic fever   . Localization-related idiopathic epilepsy and epileptic syndromes with seizures of localized onset, not intractable, without status epilepticus ([place]) 08/23/2014  . Hemorrhoid, thrombosed. 12/24/2011    Fujiko Picazo,ANGIE PTA 06/22/2019, 11:02 AM  [address] [place], [place], [address] Phone: [phone]   Fax:  [phone]  Name: Douglas Lane MRN: [id number] Date of Birth: 03-17-1946

## 2019-06-25 ENCOUNTER — Ambulatory Visit: Payer: Medicare Other | Admitting: Physical Therapy

## 2019-06-25 ENCOUNTER — Other Ambulatory Visit: Payer: Self-pay

## 2019-06-25 DIAGNOSIS — M25562 Pain in left knee: Secondary | ICD-10-CM | POA: Diagnosis not present

## 2019-06-25 DIAGNOSIS — M25662 Stiffness of left knee, not elsewhere classified: Secondary | ICD-10-CM

## 2019-06-25 NOTE — Therapy (Signed)
[facility] [place], [place], [address] Phone: [phone]   Fax:  [phone]  Physical Therapy Treatment  Patient Details  Name: Douglas Lane MRN: [id number] Date of Birth: May 08, 1946 Referring Provider (PT): Aluisio   Encounter Date: 06/25/2019  PT End of Session - 06/25/19 1053    Visit Number  4    PT Start Time  [id number]    PT Stop Time  1106    PT Time Calculation (min)  51 min       Past Medical History:  Diagnosis Date  . Aortic atherosclerosis ([place]) 03/08/2017   CT  . Aortic stenosis    severe AS s/p TAVR (03/22/17) with a 40mm Edwards Sapien 3 THV via the TF approach  . Arthritis    "pain in my knees" (03/30/2016)  . Bilateral cataracts   . CAD S/P percutaneous coronary angioplasty 03/31/2016   a. CP/USA -> LHC 03/31/16: 90% prox-mid RCA s/p DES, 40% prox-mid LAD, 25% D2, 25% prox-mid Cx, elevated LVEDP 33.  . Chronic diastolic CHF (congestive heart failure) ([place]) 03/31/2016   pt unaware, has family history  . Chronic lower back pain   . DDD (degenerative disc disease), lumbar   . Dyslipidemia   . Grade II diastolic dysfunction   . Hearing loss   . Heart murmur   . Heartburn    occ. takes TUMS  . Humeral fracture 07/2014   Right  . Kidney cysts    1.6 cm simple cyst right  . LVH (left ventricular hypertrophy)    a. severe basal septal hypertrophy by echo 03/2016.  . Mitral regurgitation    a. mild by echo 03/2016.  . Osteopenia 03/13/2009  . Psoriasis   . Rheumatic fever    as a child  . Scoliosis 03/13/2009  . Seizure ([place]) 01/2011; 07/2011; 07/2015   "idiopathic; been on Kepra since 07/2011", last seizure poss. 07/2015, absent seizure  . Sinus bradycardia - baseline HR 50s at times.   . 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 Thrombosed hemorrhoids     Past Surgical History:  Procedure Laterality Date  . APPENDECTOMY    . BACK SURGERY    . CARDIAC  CATHETERIZATION N/A 03/30/2016   Procedure: Right/Left Heart Cath and Coronary Angiography;  Surgeon: Jettie Booze, MD;  Location: [facility];  Service: Cardiovascular;  Laterality: N/A;  . CARDIAC CATHETERIZATION N/A 03/30/2016   Procedure: Coronary Stent Intervention;  Surgeon: Jettie Booze, MD;  Location: [facility];  Service: Cardiovascular;  Laterality: N/A;  . CATARACT EXTRACTION, BILATERAL    . COLONOSCOPY    . CORONARY ANGIOPLASTY    . FOREARM FRACTURE SURGERY Right ~ 2003-2004   "got steel plate in there"  . HERNIA REPAIR  2009  . I&D hemorrhoirds    . KNEE ARTHROSCOPY Bilateral   . MOHS SURGERY Left    temple  . ORIF SCAPULAR FRACTURE Right    right  . POSTERIOR LUMBAR FUSION  2004  . TEE WITHOUT CARDIOVERSION N/A 11/01/2016   Procedure: TRANSESOPHAGEAL ECHOCARDIOGRAM (TEE);  Surgeon: Sanda Klein, MD;  Location: [facility];  Service: Cardiovascular;  Laterality: N/A;  . TEE WITHOUT CARDIOVERSION N/A 03/22/2017   Procedure: TRANSESOPHAGEAL ECHOCARDIOGRAM (TEE);  Surgeon: Sherren Mocha, MD;  Location: [facility];  Service: Open Heart Surgery;  Laterality: N/A;  . TONSILLECTOMY    . TOTAL KNEE ARTHROPLASTY Right 07/24/2018  Procedure: RIGHT TOTAL KNEE ARTHROPLASTY;  Surgeon: Gaynelle Arabian, MD;  [facility];  Service: Orthopedics;  Laterality: Right;  73min  . TOTAL KNEE ARTHROPLASTY Left 06/11/2019   Procedure: TOTAL KNEE ARTHROPLASTY;  Surgeon: Gaynelle Arabian, MD;  [facility];  Service: Orthopedics;  Laterality: Left;  6min  . TRANSCATHETER AORTIC VALVE REPLACEMENT, TRANSFEMORAL N/A 03/22/2017   Procedure: TRANSCATHETER AORTIC VALVE REPLACEMENT, TRANSFEMORAL;  Surgeon: Sherren Mocha, MD;  Location: [facility];  Service: Open Heart Surgery;  Laterality: N/A;    There were no vitals filed for this visit.  Subjective Assessment - 06/25/19 1022    Subjective  "L knee is in pain". pt states he has been in pain since last treatment. pt reports  that he is icing and elevating the knee at home.    Currently in Pain?  Yes    Pain Score  6     Pain Location  Knee    Pain Orientation  Left         [facility] PT Assessment - 06/25/19 0001      AROM   Left Knee Extension  3    Left Knee Flexion  110                   [facility] Adult PT Treatment/Exercise - 06/25/19 0001      Knee/Hip Exercises: Aerobic   Nustep  level 5 x 6 minutes      Knee/Hip Exercises: Machines for Strengthening   Cybex Knee Extension  fitter 2 blue 20 reps    Cybex Knee Flexion  fitter 2 blue 20 reps    Cybex Leg Press  30# 2 sets 10      Knee/Hip Exercises: Seated   Long Arc Quad  Strengthening;Left;20 reps    Long Arc Quad Weight  3 lbs.    Hamstring Curl  Strengthening;AROM;20 reps;Left    Hamstring Limitations  red tband      Vasopneumatic   Number Minutes Vasopneumatic   15 minutes    Vasopnuematic Location   Knee    Vasopneumatic Pressure  Medium    Vasopneumatic Temperature   35      Manual Therapy   Manual Therapy  Passive ROM    Passive ROM  flexion and extension               PT Short Term Goals - 06/22/19 1101      PT SHORT TERM GOAL #1   Title  independent with intiial HEP    Status  Achieved        PT Long Term Goals - 06/18/19 1137      PT LONG TERM GOAL #1   Title  ambulate community distances without device or with SPC    Time  12    Period  Weeks    Status  New      PT LONG TERM GOAL #2   Title  decrease pain 50%    Time  12    Period  Weeks    Status  New      PT LONG TERM GOAL #3   Title  increase AROM to 3-115 degrees flexion    Time  12    Period  Weeks    Status  New      PT LONG TERM GOAL #4   Title  increase strength to 4+/5    Time  12    Period  Weeks    Status  New  PT LONG TERM GOAL #5   Title  decrease swelling 2 cm    Time  12    Period  Weeks    Status  New            Plan - 06/25/19 1055    Clinical Impression Statement  Pt arrived using his cane. Pt has  increase in AROM after MT. Exercises were kept the same as pt has complaints of pain and is not ready to be progressed. pt needs cues with LAQ to sit up straight and go through the full ROM.    Stability/Clinical Decision Making  Evolving/Moderate complexity    Rehab Potential  Good    PT Frequency  3x / week    PT Duration  8 weeks    PT Treatment/Interventions  ADLs/Self Care Home Management;Electrical Stimulation;Gait training;Stair training;Functional mobility training;Therapeutic activities;Therapeutic exercise;Patient/family education;Neuromuscular re-education;Balance training;Manual techniques;Vasopneumatic Device;Passive range of motion    PT Next Visit Plan  continue to work on ROM, strnegth, gait and function as well as his swelling.       Patient will benefit from skilled therapeutic intervention in order to improve the following deficits and impairments:  Abnormal gait, Pain, Decreased scar mobility, Cardiopulmonary status limiting activity, Decreased activity tolerance, Decreased range of motion, Decreased strength, Decreased balance, Difficulty walking, Increased edema, Impaired flexibility  Visit Diagnosis: Stiffness of left knee, not elsewhere classified     Problem List Patient Active Problem List   Diagnosis Date Noted  . OA (osteoarthritis) of knee 07/24/2018  . Chronic diastolic heart failure ([place]) 11/13/2017  . Benign essential HTN 11/13/2017  . S/P TAVR (transcatheter aortic valve replacement) 03/22/2017  . Severe aortic stenosis 03/22/2017  . Bradycardia, drug induced 10/28/2016  . Dyslipidemia 03/31/2016  . Mitral regurgitation 03/31/2016  . CAD S/P percutaneous coronary angioplasty 03/31/2016  . Abnormal nuclear stress test   . Heart murmur 03/06/2015  . Abnormal EKG 03/06/2015  . LVH (left ventricular hypertrophy) 03/06/2015  . Rheumatic fever   . Localization-related idiopathic epilepsy and epileptic syndromes with seizures of localized onset, not  intractable, without status epilepticus ([place]) 08/23/2014  . Hemorrhoid, thrombosed. 12/24/2011    Barrett Henle, [place] 06/25/2019, 10:59 AM  [address] [place], [place], [address] Phone: [phone]   Fax:  [phone]  Name: Douglas Lane MRN: [id number] Date of Birth: 07-26-1946

## 2019-06-27 ENCOUNTER — Other Ambulatory Visit: Payer: Self-pay

## 2019-06-27 ENCOUNTER — Ambulatory Visit: Payer: Medicare Other | Admitting: Physical Therapy

## 2019-06-27 DIAGNOSIS — M25662 Stiffness of left knee, not elsewhere classified: Secondary | ICD-10-CM

## 2019-06-27 DIAGNOSIS — R262 Difficulty in walking, not elsewhere classified: Secondary | ICD-10-CM

## 2019-06-27 DIAGNOSIS — M25562 Pain in left knee: Secondary | ICD-10-CM | POA: Diagnosis not present

## 2019-06-27 NOTE — Therapy (Signed)
[address] [place], [place], [address] Phone: [phone]   Fax:  [phone]  Physical Therapy Treatment  Patient Details  Name: Douglas Lane MRN: [id number] Date of Birth: April 03, 1946 Referring Provider (PT): Aluisio   Encounter Date: 06/27/2019  PT End of Session - 06/27/19 1051    Visit Number  5    PT Start Time  [id number]    PT Stop Time  1107    PT Time Calculation (min)  52 min       Past Medical History:  Diagnosis Date  . Aortic atherosclerosis ([place]) 03/08/2017   CT  . Aortic stenosis    severe AS s/p TAVR (03/22/17) with a 51mm Edwards Sapien 3 THV via the TF approach  . Arthritis    "pain in my knees" (03/30/2016)  . Bilateral cataracts   . CAD S/P percutaneous coronary angioplasty 03/31/2016   a. CP/USA -> LHC 03/31/16: 90% prox-mid RCA s/p DES, 40% prox-mid LAD, 25% D2, 25% prox-mid Cx, elevated LVEDP 33.  . Chronic diastolic CHF (congestive heart failure) ([place]) 03/31/2016   pt unaware, has family history  . Chronic lower back pain   . DDD (degenerative disc disease), lumbar   . Dyslipidemia   . Grade II diastolic dysfunction   . Hearing loss   . Heart murmur   . Heartburn    occ. takes TUMS  . Humeral fracture 07/2014   Right  . Kidney cysts    1.6 cm simple cyst right  . LVH (left ventricular hypertrophy)    a. severe basal septal hypertrophy by echo 03/2016.  . Mitral regurgitation    a. mild by echo 03/2016.  . Osteopenia 03/13/2009  . Psoriasis   . Rheumatic fever    as a child  . Scoliosis 03/13/2009  . Seizure ([place]) 01/2011; 07/2011; 07/2015   "idiopathic; been on Kepra since 07/2011", last seizure poss. 07/2015, absent seizure  . Sinus bradycardia - baseline HR 50s at times.   . 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 Thrombosed hemorrhoids     Past Surgical History:  Procedure Laterality Date  . APPENDECTOMY    . BACK SURGERY    . CARDIAC  CATHETERIZATION N/A 03/30/2016   Procedure: Right/Left Heart Cath and Coronary Angiography;  Surgeon: Jettie Booze, MD;  Location: [facility];  Service: Cardiovascular;  Laterality: N/A;  . CARDIAC CATHETERIZATION N/A 03/30/2016   Procedure: Coronary Stent Intervention;  Surgeon: Jettie Booze, MD;  Location: [facility];  Service: Cardiovascular;  Laterality: N/A;  . CATARACT EXTRACTION, BILATERAL    . COLONOSCOPY    . CORONARY ANGIOPLASTY    . FOREARM FRACTURE SURGERY Right ~ 2003-2004   "got steel plate in there"  . HERNIA REPAIR  2009  . I&D hemorrhoirds    . KNEE ARTHROSCOPY Bilateral   . MOHS SURGERY Left    temple  . ORIF SCAPULAR FRACTURE Right    right  . POSTERIOR LUMBAR FUSION  2004  . TEE WITHOUT CARDIOVERSION N/A 11/01/2016   Procedure: TRANSESOPHAGEAL ECHOCARDIOGRAM (TEE);  Surgeon: Sanda Klein, MD;  Location: [facility];  Service: Cardiovascular;  Laterality: N/A;  . TEE WITHOUT CARDIOVERSION N/A 03/22/2017   Procedure: TRANSESOPHAGEAL ECHOCARDIOGRAM (TEE);  Surgeon: Sherren Mocha, MD;  Location: [facility];  Service: Open Heart Surgery;  Laterality: N/A;  . TONSILLECTOMY    . TOTAL KNEE ARTHROPLASTY Right 07/24/2018  Procedure: RIGHT TOTAL KNEE ARTHROPLASTY;  Surgeon: Gaynelle Arabian, MD;  [facility];  Service: Orthopedics;  Laterality: Right;  47min  . TOTAL KNEE ARTHROPLASTY Left 06/11/2019   Procedure: TOTAL KNEE ARTHROPLASTY;  Surgeon: Gaynelle Arabian, MD;  [facility];  Service: Orthopedics;  Laterality: Left;  47min  . TRANSCATHETER AORTIC VALVE REPLACEMENT, TRANSFEMORAL N/A 03/22/2017   Procedure: TRANSCATHETER AORTIC VALVE REPLACEMENT, TRANSFEMORAL;  Surgeon: Sherren Mocha, MD;  Location: [facility];  Service: Open Heart Surgery;  Laterality: N/A;    There were no vitals filed for this visit.  Subjective Assessment - 06/27/19 1018    Subjective  "doing ok"    Currently in Pain?  Yes    Pain Score  4     Pain Location  Knee     Pain Orientation  Left                       [facility] Adult PT Treatment/Exercise - 06/27/19 0001      Knee/Hip Exercises: Aerobic   Nustep  level 5 x 6 minutes      Knee/Hip Exercises: Machines for Strengthening   Cybex Knee Extension  10# BLE 2 x 10    Cybex Knee Flexion  25# BLE 2 x 10    Cybex Leg Press  70# 2 x 10, no weight lowered to position #4   overpressure given w/ extension              PT Short Term Goals - 06/22/19 1101      PT SHORT TERM GOAL #1   Title  independent with intiial HEP    Status  Achieved        PT Long Term Goals - 06/18/19 1137      PT LONG TERM GOAL #1   Title  ambulate community distances without device or with SPC    Time  12    Period  Weeks    Status  New      PT LONG TERM GOAL #2   Title  decrease pain 50%    Time  12    Period  Weeks    Status  New      PT LONG TERM GOAL #3   Title  increase AROM to 3-115 degrees flexion    Time  12    Period  Weeks    Status  New      PT LONG TERM GOAL #4   Title  increase strength to 4+/5    Time  12    Period  Weeks    Status  New      PT LONG TERM GOAL #5   Title  decrease swelling 2 cm    Time  12    Period  Weeks    Status  New            Plan - 06/27/19 1055    Clinical Impression Statement  pt able to walk down hall without cane. pt very stiff in walking and is limited in knee flexion. pt able to reach postion #4 on the leg press consistently. pt able to increase weight with leg press. pt progressed to knee flex/ext on machine. needs cues for proper technique.       Patient will benefit from skilled therapeutic intervention in order to improve the following deficits and impairments:  Abnormal gait, Pain, Decreased scar mobility, Cardiopulmonary status limiting activity, Decreased activity tolerance, Decreased range of motion, Decreased strength,  Decreased balance, Difficulty walking, Increased edema, Impaired flexibility  Visit  Diagnosis: Stiffness of left knee, not elsewhere classified  Difficulty in walking, not elsewhere classified     Problem List Patient Active Problem List   Diagnosis Date Noted  . OA (osteoarthritis) of knee 07/24/2018  . Chronic diastolic heart failure ([place]) 11/13/2017  . Benign essential HTN 11/13/2017  . S/P TAVR (transcatheter aortic valve replacement) 03/22/2017  . Severe aortic stenosis 03/22/2017  . Bradycardia, drug induced 10/28/2016  . Dyslipidemia 03/31/2016  . Mitral regurgitation 03/31/2016  . CAD S/P percutaneous coronary angioplasty 03/31/2016  . Abnormal nuclear stress test   . Heart murmur 03/06/2015  . Abnormal EKG 03/06/2015  . LVH (left ventricular hypertrophy) 03/06/2015  . Rheumatic fever   . Localization-related idiopathic epilepsy and epileptic syndromes with seizures of localized onset, not intractable, without status epilepticus ([place]) 08/23/2014  . Hemorrhoid, thrombosed. 12/24/2011    Barrett Henle, [place] 06/27/2019, 11:01 AM  [facility] [address] [place], [place], [address] Phone: [phone]   Fax:  [phone]  Name: Douglas Lane MRN: [id number] Date of Birth: 1946/01/19

## 2019-06-29 ENCOUNTER — Other Ambulatory Visit: Payer: Self-pay

## 2019-06-29 ENCOUNTER — Encounter: Payer: Self-pay | Admitting: Physical Therapy

## 2019-06-29 ENCOUNTER — Ambulatory Visit: Payer: Medicare Other | Admitting: Physical Therapy

## 2019-06-29 DIAGNOSIS — R262 Difficulty in walking, not elsewhere classified: Secondary | ICD-10-CM

## 2019-06-29 DIAGNOSIS — M25562 Pain in left knee: Secondary | ICD-10-CM | POA: Diagnosis not present

## 2019-06-29 DIAGNOSIS — M25662 Stiffness of left knee, not elsewhere classified: Secondary | ICD-10-CM

## 2019-06-29 NOTE — Therapy (Signed)
[address] [place], [place], [address] Phone: [phone]   Fax:  [phone]  Physical Therapy Treatment  Patient Details  Name: Douglas Lane MRN: [id number] Date of Birth: 25-Jul-1946 Referring Provider (PT): Aluisio   Encounter Date: 06/29/2019  PT End of Session - 06/29/19 1105    Visit Number  6    Date for PT Re-Evaluation  08/18/19    Authorization Type  [employer]-MC    PT Start Time  1015    PT Stop Time  1100    PT Time Calculation (min)  45 min    Activity Tolerance  Patient tolerated treatment well    Behavior During Therapy  [facility] for tasks assessed/performed       Past Medical History:  Diagnosis Date  . Aortic atherosclerosis ([place]) 03/08/2017   CT  . Aortic stenosis    severe AS s/p TAVR (03/22/17) with a 4mm Edwards Sapien 3 THV via the TF approach  . Arthritis    "pain in my knees" (03/30/2016)  . Bilateral cataracts   . CAD S/P percutaneous coronary angioplasty 03/31/2016   a. CP/USA -> LHC 03/31/16: 90% prox-mid RCA s/p DES, 40% prox-mid LAD, 25% D2, 25% prox-mid Cx, elevated LVEDP 33.  . Chronic diastolic CHF (congestive heart failure) ([place]) 03/31/2016   pt unaware, has family history  . Chronic lower back pain   . DDD (degenerative disc disease), lumbar   . Dyslipidemia   . Grade II diastolic dysfunction   . Hearing loss   . Heart murmur   . Heartburn    occ. takes TUMS  . Humeral fracture 07/2014   Right  . Kidney cysts    1.6 cm simple cyst right  . LVH (left ventricular hypertrophy)    a. severe basal septal hypertrophy by echo 03/2016.  . Mitral regurgitation    a. mild by echo 03/2016.  . Osteopenia 03/13/2009  . Psoriasis   . Rheumatic fever    as a child  . Scoliosis 03/13/2009  . Seizure ([place]) 01/2011; 07/2011; 07/2015   "idiopathic; been on Kepra since 07/2011", last seizure poss. 07/2015, absent seizure  . Sinus bradycardia - baseline HR 50s at times.   .  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 Thrombosed hemorrhoids     Past Surgical History:  Procedure Laterality Date  . APPENDECTOMY    . BACK SURGERY    . CARDIAC CATHETERIZATION N/A 03/30/2016   Procedure: Right/Left Heart Cath and Coronary Angiography;  Surgeon: Jettie Booze, MD;  Location: [place] [facility];  Service: Cardiovascular;  Laterality: N/A;  . CARDIAC CATHETERIZATION N/A 03/30/2016   Procedure: Coronary Stent Intervention;  Surgeon: Jettie Booze, MD;  Location: [place] [facility];  Service: Cardiovascular;  Laterality: N/A;  . CATARACT EXTRACTION, BILATERAL    . COLONOSCOPY    . CORONARY ANGIOPLASTY    . FOREARM FRACTURE SURGERY Right ~ 2003-2004   "got steel plate in there"  . HERNIA REPAIR  2009  . I&D hemorrhoirds    . KNEE ARTHROSCOPY Bilateral   . MOHS SURGERY Left    temple  . ORIF SCAPULAR FRACTURE Right    right  . POSTERIOR LUMBAR FUSION  2004  . TEE WITHOUT CARDIOVERSION N/A 11/01/2016   Procedure: TRANSESOPHAGEAL ECHOCARDIOGRAM (TEE);  Surgeon: Sanda Klein, MD;  Location: [facility];  Service: Cardiovascular;  Laterality: N/A;  . TEE WITHOUT CARDIOVERSION  N/A 03/22/2017   Procedure: TRANSESOPHAGEAL ECHOCARDIOGRAM (TEE);  Surgeon: Sherren Mocha, MD;  Location: [place];  Service: Open Heart Surgery;  Laterality: N/A;  . TONSILLECTOMY    . TOTAL KNEE ARTHROPLASTY Right 07/24/2018   Procedure: RIGHT TOTAL KNEE ARTHROPLASTY;  Surgeon: Gaynelle Arabian, MD;  [facility];  Service: Orthopedics;  Laterality: Right;  13min  . TOTAL KNEE ARTHROPLASTY Left 06/11/2019   Procedure: TOTAL KNEE ARTHROPLASTY;  Surgeon: Gaynelle Arabian, MD;  [facility];  Service: Orthopedics;  Laterality: Left;  43min  . TRANSCATHETER AORTIC VALVE REPLACEMENT, TRANSFEMORAL N/A 03/22/2017   Procedure: TRANSCATHETER AORTIC VALVE REPLACEMENT, TRANSFEMORAL;  Surgeon: Sherren Mocha, MD;  Location: [place];  Service: Open Heart Surgery;  Laterality:  N/A;    There were no vitals filed for this visit.  Subjective Assessment - 06/29/19 1019    Subjective  "cant complain too much, still swelling and pain, but im getting around the house ok"    Currently in Pain?  No/denies    Pain Score  5     Pain Location  Knee    Pain Orientation  Left                       [facility] Adult PT Treatment/Exercise - 06/29/19 0001      Knee/Hip Exercises: Aerobic   Recumbent Bike  L1 x 4 min     Nustep  level 5 x 6 minutes      Knee/Hip Exercises: Machines for Strengthening   Cybex Knee Extension  10# BLE 2 x 10    Cybex Knee Flexion  25# BLE 2 x 10, LLE 20lb 2x10, LLE 5lb 2x10    Cybex Leg Press  40lb x15, then 15 wiht BOSU, LLE 20lb 2x5       Knee/Hip Exercises: Standing   Heel Raises  Both;2 sets;15 reps;2 seconds    Forward Step Up  Left;1 set;10 reps;Hand Hold: 0;Step Height: 4"      Knee/Hip Exercises: Seated   Sit to Sand  1 set;10 reps;without UE support   blue Chair               PT Short Term Goals - 06/22/19 1101      PT SHORT TERM GOAL #1   Title  independent with intiial HEP    Status  Achieved        PT Long Term Goals - 06/18/19 1137      PT LONG TERM GOAL #1   Title  ambulate community distances without device or with SPC    Time  12    Period  Weeks    Status  New      PT LONG TERM GOAL #2   Title  decrease pain 50%    Time  12    Period  Weeks    Status  New      PT LONG TERM GOAL #3   Title  increase AROM to 3-115 degrees flexion    Time  12    Period  Weeks    Status  New      PT LONG TERM GOAL #4   Title  increase strength to 4+/5    Time  12    Period  Weeks    Status  New      PT LONG TERM GOAL #5   Title  decrease swelling 2 cm    Time  12    Period  Weeks  Status  New            Plan - 06/29/19 1105    Clinical Impression Statement  Pt did well with a  progression to SL strengthening. Functional weakness exposed with resisted side steps and forward step  ups. Some instability on leg press distributing pressure on BOSU. Pt has good R knee ROM overall, L quad did fatigue with SL extensions.    Stability/Clinical Decision Making  Evolving/Moderate complexity    Rehab Potential  Good    PT Duration  8 weeks    PT Treatment/Interventions  ADLs/Self Care Home Management;Electrical Stimulation;Gait training;Stair training;Functional mobility training;Therapeutic activities;Therapeutic exercise;Patient/family education;Neuromuscular re-education;Balance training;Manual techniques;Vasopneumatic Device;Passive range of motion    PT Next Visit Plan  continue to work on ROM, strnegth, gait and function as well as his swelling.       Patient will benefit from skilled therapeutic intervention in order to improve the following deficits and impairments:  Abnormal gait, Pain, Decreased scar mobility, Cardiopulmonary status limiting activity, Decreased activity tolerance, Decreased range of motion, Decreased strength, Decreased balance, Difficulty walking, Increased edema, Impaired flexibility  Visit Diagnosis: Acute pain of left knee  Difficulty in walking, not elsewhere classified  Stiffness of left knee, not elsewhere classified     Problem List Patient Active Problem List   Diagnosis Date Noted  . OA (osteoarthritis) of knee 07/24/2018  . Chronic diastolic heart failure ([place]) 11/13/2017  . Benign essential HTN 11/13/2017  . S/P TAVR (transcatheter aortic valve replacement) 03/22/2017  . Severe aortic stenosis 03/22/2017  . Bradycardia, drug induced 10/28/2016  . Dyslipidemia 03/31/2016  . Mitral regurgitation 03/31/2016  . CAD S/P percutaneous coronary angioplasty 03/31/2016  . Abnormal nuclear stress test   . Heart murmur 03/06/2015  . Abnormal EKG 03/06/2015  . LVH (left ventricular hypertrophy) 03/06/2015  . Rheumatic fever   . Localization-related idiopathic epilepsy and epileptic syndromes with seizures of localized onset, not  intractable, without status epilepticus ([place]) 08/23/2014  . Hemorrhoid, thrombosed. 12/24/2011    Scot Jun, PTA 06/29/2019, 11:11 AM  [address] [place], [place], [address] Phone: [phone]   Fax:  [phone]  Name: Douglas Lane MRN: [id number] Date of Birth: 1946-02-11

## 2019-07-02 ENCOUNTER — Other Ambulatory Visit: Payer: Self-pay

## 2019-07-02 ENCOUNTER — Encounter: Payer: Self-pay | Admitting: Physical Therapy

## 2019-07-02 ENCOUNTER — Ambulatory Visit: Payer: Medicare Other | Admitting: Physical Therapy

## 2019-07-02 DIAGNOSIS — R262 Difficulty in walking, not elsewhere classified: Secondary | ICD-10-CM

## 2019-07-02 DIAGNOSIS — R600 Localized edema: Secondary | ICD-10-CM

## 2019-07-02 DIAGNOSIS — M25662 Stiffness of left knee, not elsewhere classified: Secondary | ICD-10-CM

## 2019-07-02 DIAGNOSIS — M25562 Pain in left knee: Secondary | ICD-10-CM

## 2019-07-02 NOTE — Therapy (Signed)
[address] [place], [place], [address] Phone: [phone]   Fax:  [phone]  Physical Therapy Treatment  Patient Details  Name: Douglas Lane MRN: [id number] Date of Birth: 31-Jul-1946 Referring Provider (PT): Aluisio   Encounter Date: 07/02/2019  PT End of Session - 07/02/19 1052    Visit Number  7    Date for PT Re-Evaluation  08/18/19    Authorization Type  [employer]-MC    PT Start Time  1015    PT Stop Time  1110    PT Time Calculation (min)  55 min    Activity Tolerance  Patient tolerated treatment well    Behavior During Therapy  [facility] for tasks assessed/performed       Past Medical History:  Diagnosis Date  . Aortic atherosclerosis ([place]) 03/08/2017   CT  . Aortic stenosis    severe AS s/p TAVR (03/22/17) with a 90m Edwards Sapien 3 THV via the TF approach  . Arthritis    "pain in my knees" (03/30/2016)  . Bilateral cataracts   . CAD S/P percutaneous coronary angioplasty 03/31/2016   a. CP/[place] -> LHC 03/31/16: 90% prox-mid RCA s/p DES, 40% prox-mid LAD, 25% D2, 25% prox-mid Cx, elevated LVEDP 33.  . Chronic diastolic CHF (congestive heart failure) ([place] 03/31/2016   pt unaware, has family history  . Chronic lower back pain   . DDD (degenerative disc disease), lumbar   . Dyslipidemia   . Grade II diastolic dysfunction   . Hearing loss   . Heart murmur   . Heartburn    occ. takes TUMS  . Humeral fracture 07/2014   Right  . Kidney cysts    1.6 cm simple cyst right  . LVH (left ventricular hypertrophy)    a. severe basal septal hypertrophy by echo 03/2016.  . Mitral regurgitation    a. mild by echo 03/2016.  . Osteopenia 03/13/2009  . Psoriasis   . Rheumatic fever    as a child  . Scoliosis 03/13/2009  . Seizure ([place] 01/2011; 07/2011; 07/2015   "idiopathic; been on Kepra since 07/2011", last seizure poss. 07/2015, absent seizure  . Sinus bradycardia - baseline HR 50s at times.   .  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 Thrombosed hemorrhoids     Past Surgical History:  Procedure Laterality Date  . APPENDECTOMY    . BACK SURGERY    . CARDIAC CATHETERIZATION N/A 03/30/2016   Procedure: Right/Left Heart Cath and Coronary Angiography;  Surgeon: JJettie Booze MD;  [facility];  Service: Cardiovascular;  Laterality: N/A;  . CARDIAC CATHETERIZATION N/A 03/30/2016   Procedure: Coronary Stent Intervention;  Surgeon: JJettie Booze MD;  [facility];  Service: Cardiovascular;  Laterality: N/A;  . CATARACT EXTRACTION, BILATERAL    . COLONOSCOPY    . CORONARY ANGIOPLASTY    . FOREARM FRACTURE SURGERY Right ~ 2003-2004   "got steel plate in there"  . HERNIA REPAIR  2009  . I&D hemorrhoirds    . KNEE ARTHROSCOPY Bilateral   . MOHS SURGERY Left    temple  . ORIF SCAPULAR FRACTURE Right    right  . POSTERIOR LUMBAR FUSION  2004  . TEE WITHOUT CARDIOVERSION N/A 11/01/2016   Procedure: TRANSESOPHAGEAL ECHOCARDIOGRAM (TEE);  Surgeon: MSanda Klein MD;  [facility];  Service: Cardiovascular;  Laterality: N/A;  . TEE WITHOUT CARDIOVERSION  N/A 03/22/2017   Procedure: TRANSESOPHAGEAL ECHOCARDIOGRAM (TEE);  Surgeon: Sherren Mocha, MD;  Location: [place];  Service: Open Heart Surgery;  Laterality: N/A;  . TONSILLECTOMY    . TOTAL KNEE ARTHROPLASTY Right 07/24/2018   Procedure: RIGHT TOTAL KNEE ARTHROPLASTY;  Surgeon: Gaynelle Arabian, MD;  [facility];  Service: Orthopedics;  Laterality: Right;  52mn  . TOTAL KNEE ARTHROPLASTY Left 06/11/2019   Procedure: TOTAL KNEE ARTHROPLASTY;  Surgeon: AGaynelle Arabian MD;  [facility];  Service: Orthopedics;  Laterality: Left;  558m  . TRANSCATHETER AORTIC VALVE REPLACEMENT, TRANSFEMORAL N/A 03/22/2017   Procedure: TRANSCATHETER AORTIC VALVE REPLACEMENT, TRANSFEMORAL;  Surgeon: CoSherren MochaMD;  [facility] Service: Open [facility] Surgery;  Laterality:  N/A;    There were no vitals filed for this visit.  Subjective Assessment - 07/02/19 1016    Subjective  Patient reports that he was very sore after the last treatment pain a 6-7/10 on Saturday    Currently in Pain?  Yes    Pain Score  3     Pain Location  Knee    Pain Orientation  Left    Aggravating Factors   stretching                       [facility] Adult PT Treatment/Exercise - 07/02/19 0001      Ambulation/Gait   Gait Comments  gait without device cues for bending knee and not just straight legged, then did stairs step over step up and down      High Level Balance   High Level Balance Comments  ball kickis alternating legs, resisted gait all directions      Knee/Hip Exercises: Aerobic   Recumbent Bike  L1 x 5 min     Nustep  level 5 x 6 minutes      Knee/Hip Exercises: Machines for Strengthening   Cybex Knee Flexion  25# BLE 2 x 10, LLE 20lb 2x10, LLE 5lb 2x10    Cybex Leg Press  20# eally work on flexion, trying to get lower today, tehn no weight and trying to get low, got down to posistion #2      Vasopneumatic   Number Minutes Vasopneumatic   15 minutes    Vasopnuematic Location   Knee    Vasopneumatic Pressure  Medium    Vasopneumatic Temperature   35               PT Short Term Goals - 06/22/19 1101      PT SHORT TERM GOAL #1   Title  independent with intiial HEP    Status  Achieved        PT Long Term Goals - 07/02/19 1054      PT LONG TERM GOAL #1   Title  ambulate community distances without device or with SPC    Status  Partially Met      PT LONG TERM GOAL #2   Title  decrease pain 50%    Status  Partially Met      PT LONG TERM GOAL #3   Title  increase AROM to 3-115 degrees flexion    Status  Partially Met      PT LONG TERM GOAL #4   Title  increase strength to 4+/5    Status  Partially Met      PT LONG TERM GOAL #5   Title  decrease swelling 2 cm    Status  Partially Met  Plan - 07/02/19 1053     Clinical Impression Statement  Patietn still walking stiff legged, went back and had him to some exaggerated steps into flexion and then try to smooth out, he has great ROM, he does have some pain with walking but seems to be more of a habit.  Cues needed for the gait and has difficulty ascending and descending stairs    PT Next Visit Plan  continue to work on ROM, strength, gait and function as well as his swelling.    Consulted and Agree with Plan of Care  Patient       Patient will benefit from skilled therapeutic intervention in order to improve the following deficits and impairments:  Abnormal gait, Pain, Decreased scar mobility, Cardiopulmonary status limiting activity, Decreased activity tolerance, Decreased range of motion, Decreased strength, Decreased balance, Difficulty walking, Increased edema, Impaired flexibility  Visit Diagnosis: Acute pain of left knee  Difficulty in walking, not elsewhere classified  Stiffness of left knee, not elsewhere classified  Localized edema     Problem List Patient Active Problem List   Diagnosis Date Noted  . OA (osteoarthritis) of knee 07/24/2018  . Chronic diastolic heart failure ([place]) 11/13/2017  . Benign essential HTN 11/13/2017  . S/P TAVR (transcatheter aortic valve replacement) 03/22/2017  . Severe aortic stenosis 03/22/2017  . Bradycardia, drug induced 10/28/2016  . Dyslipidemia 03/31/2016  . Mitral regurgitation 03/31/2016  . CAD S/P percutaneous coronary angioplasty 03/31/2016  . Abnormal nuclear stress test   . Heart murmur 03/06/2015  . Abnormal EKG 03/06/2015  . LVH (left ventricular hypertrophy) 03/06/2015  . Rheumatic fever   . Localization-related idiopathic epilepsy and epileptic syndromes with seizures of localized onset, not intractable, without status epilepticus ([place]) 08/23/2014  . Hemorrhoid, thrombosed. 12/24/2011    Sumner Boast., PT 07/02/2019, 11:03 AM  [address] [place], [place], [address] Phone: [phone]   Fax:  [phone]  Name: JANSSEN ZEE MRN: [id number] Date of Birth: 1946/03/13

## 2019-07-03 ENCOUNTER — Ambulatory Visit: Payer: Medicare Other | Admitting: Physical Therapy

## 2019-07-03 ENCOUNTER — Encounter: Payer: Self-pay | Admitting: Physical Therapy

## 2019-07-03 DIAGNOSIS — M25562 Pain in left knee: Secondary | ICD-10-CM | POA: Diagnosis not present

## 2019-07-03 DIAGNOSIS — M25662 Stiffness of left knee, not elsewhere classified: Secondary | ICD-10-CM

## 2019-07-03 DIAGNOSIS — R262 Difficulty in walking, not elsewhere classified: Secondary | ICD-10-CM

## 2019-07-03 DIAGNOSIS — R600 Localized edema: Secondary | ICD-10-CM

## 2019-07-03 NOTE — Therapy (Signed)
[address] [place], [place], [address] Phone: [phone]   Fax:  [phone]  Physical Therapy Treatment  Patient Details  Name: Douglas Lane MRN: [id number] Date of Birth: [birth year] Referring Provider (PT): Aluisio   Encounter Date: 07/03/2019  PT End of Session - 07/03/19 1052    Visit Number  8    Date for PT Re-Evaluation  08/18/19    Authorization Type  UHC-MC    PT Start Time  1015    PT Stop Time  1056    PT Time Calculation (min)  41 min    Activity Tolerance  Patient tolerated treatment well    Behavior During Therapy  [facility] for tasks assessed/performed       Past Medical History:  Diagnosis Date  . Aortic atherosclerosis ([place]) 03/08/2017   CT  . Aortic stenosis    severe AS s/p TAVR (03/22/17) with a 40m Edwards Sapien 3 THV via the TF approach  . Arthritis    "pain in my knees" (03/30/2016)  . Bilateral cataracts   . CAD S/P percutaneous coronary angioplasty 03/31/2016   a. CP/USA -> LHC 03/31/16: 90% prox-mid RCA s/p DES, 40% prox-mid LAD, 25% D2, 25% prox-mid Cx, elevated LVEDP 33.  . Chronic diastolic CHF (congestive heart failure) ([place] 03/31/2016   pt unaware, has family history  . Chronic lower back pain   . DDD (degenerative disc disease), lumbar   . Dyslipidemia   . Grade II diastolic dysfunction   . Hearing loss   . Heart murmur   . Heartburn    occ. takes TUMS  . Humeral fracture 07/2014   Right  . Kidney cysts    1.6 cm simple cyst right  . LVH (left ventricular hypertrophy)    a. severe basal septal hypertrophy by echo 03/2016.  . Mitral regurgitation    a. mild by echo 03/2016.  . Osteopenia 03/13/2009  . Psoriasis   . Rheumatic fever    as a child  . Scoliosis 03/13/2009  . Seizure ([place] 01/2011; 07/2011; 07/2015   "idiopathic; been on Kepra since 07/2011", last seizure poss. 07/2015, absent seizure  . Sinus bradycardia - baseline HR 50s at times.   .  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 Thrombosed hemorrhoids     Past Surgical History:  Procedure Laterality Date  . APPENDECTOMY    . BACK SURGERY    . CARDIAC CATHETERIZATION N/A 03/30/2016   Procedure: Right/Left Heart Cath and Coronary Angiography;  Surgeon: JJettie Booze MD;  [facility];  Service: Cardiovascular;  Laterality: N/A;  . CARDIAC CATHETERIZATION N/A 03/30/2016   Procedure: Coronary Stent Intervention;  Surgeon: JJettie Booze MD;  [facility];  Service: Cardiovascular;  Laterality: N/A;  . CATARACT EXTRACTION, BILATERAL    . COLONOSCOPY    . CORONARY ANGIOPLASTY    . FOREARM FRACTURE SURGERY Right ~ 2003-2004   "got steel plate in there"  . HERNIA REPAIR  2009  . I&D hemorrhoirds    . KNEE ARTHROSCOPY Bilateral   . MOHS SURGERY Left    temple  . ORIF SCAPULAR FRACTURE Right    right  . POSTERIOR LUMBAR FUSION  2004  . TEE WITHOUT CARDIOVERSION N/A 11/01/2016   Procedure: TRANSESOPHAGEAL ECHOCARDIOGRAM (TEE);  Surgeon: MSanda Klein MD;  [facility];  Service: Cardiovascular;  Laterality: N/A;  . TEE WITHOUT CARDIOVERSION  N/A 03/22/2017   Procedure: TRANSESOPHAGEAL ECHOCARDIOGRAM (TEE);  Surgeon: Sherren Mocha, MD;  Location: [place];  Service: Open Heart Surgery;  Laterality: N/A;  . TONSILLECTOMY    . TOTAL KNEE ARTHROPLASTY Right 07/24/2018   Procedure: RIGHT TOTAL KNEE ARTHROPLASTY;  Surgeon: Gaynelle Arabian, MD;  [facility];  Service: Orthopedics;  Laterality: Right;  43mn  . TOTAL KNEE ARTHROPLASTY Left 06/11/2019   Procedure: TOTAL KNEE ARTHROPLASTY;  Surgeon: AGaynelle Arabian MD;  [facility];  Service: Orthopedics;  Laterality: Left;  59m  . TRANSCATHETER AORTIC VALVE REPLACEMENT, TRANSFEMORAL N/A 03/22/2017   Procedure: TRANSCATHETER AORTIC VALVE REPLACEMENT, TRANSFEMORAL;  Surgeon: CoSherren MochaMD;  [facility] [place] Service: Open [facility] Surgery;  Laterality:  N/A;    There were no vitals filed for this visit.  Subjective Assessment - 07/03/19 1018    Subjective  Patient reports that after yesterday he had to go take a nap fatigue and pain    Currently in Pain?  Yes    Pain Score  4     Pain Location  Knee    Pain Orientation  Left    Pain Descriptors / Indicators  Aching    Pain Relieving Factors  rest                       [facility] Adult PT Treatment/Exercise - 07/03/19 0001      High Level Balance   High Level Balance Comments  ball kickis alternating legs, resisted gait all directions      Knee/Hip Exercises: Aerobic   Recumbent Bike  L2 x 6 min       Knee/Hip Exercises: Machines for Strengthening   Cybex Knee Extension  5# 3x10    Cybex Knee Flexion  25# 3x10    Cybex Leg Press  20# both legs working on increased flexion, 2x10, then left only 20# smaller ROM, really shaking with the quad use      Knee/Hip Exercises: Supine   Short Arc Quad Sets  Left;2 sets;10 reps;Limitations    Short Arc Quad Sets Limitations  5# cues to get better TKE and qud activation               PT Short Term Goals - 06/22/19 1101      PT SHORT TERM GOAL #1   Title  independent with intiial HEP    Status  Achieved        PT Long Term Goals - 07/02/19 1054      PT LONG TERM GOAL #1   Title  ambulate community distances without device or with SPC    Status  Partially Met      PT LONG TERM GOAL #2   Title  decrease pain 50%    Status  Partially Met      PT LONG TERM GOAL #3   Title  increase AROM to 3-115 degrees flexion    Status  Partially Met      PT LONG TERM GOAL #4   Title  increase strength to 4+/5    Status  Partially Met      PT LONG TERM GOAL #5   Title  decrease swelling 2 cm    Status  Partially Met            Plan - 07/03/19 1055    Clinical Impression Statement  Patient walking better today demonstrating better flexion during swing phase.  He does fatigue easily wiht the resisted gait.  On  the leg press with single leg the quad really struggled with shaking and difficulty with TKE    PT Next Visit Plan  work on gait and function    Consulted and Agree with Plan of Care  Patient       Patient will benefit from skilled therapeutic intervention in order to improve the following deficits and impairments:  Abnormal gait, Pain, Decreased scar mobility, Cardiopulmonary status limiting activity, Decreased activity tolerance, Decreased range of motion, Decreased strength, Decreased balance, Difficulty walking, Increased edema, Impaired flexibility  Visit Diagnosis: Acute pain of left knee  Difficulty in walking, not elsewhere classified  Stiffness of left knee, not elsewhere classified  Localized edema     Problem List Patient Active Problem List   Diagnosis Date Noted  . OA (osteoarthritis) of knee 07/24/2018  . Chronic diastolic heart failure ([place]) 11/13/2017  . Benign essential HTN 11/13/2017  . S/P TAVR (transcatheter aortic valve replacement) 03/22/2017  . Severe aortic stenosis 03/22/2017  . Bradycardia, drug induced 10/28/2016  . Dyslipidemia 03/31/2016  . Mitral regurgitation 03/31/2016  . CAD S/P percutaneous coronary angioplasty 03/31/2016  . Abnormal nuclear stress test   . Heart murmur 03/06/2015  . Abnormal EKG 03/06/2015  . LVH (left ventricular hypertrophy) 03/06/2015  . Rheumatic fever   . Localization-related idiopathic epilepsy and epileptic syndromes with seizures of localized onset, not intractable, without status epilepticus ([place]) 08/23/2014  . Hemorrhoid, thrombosed. 12/24/2011    Sumner Boast., PT 07/03/2019, 10:57 AM  [address] [place], [place], [address] Phone: [phone]   Fax:  [phone]  Name: Douglas Lane MRN: [id number] Date of Birth: [birth year]

## 2019-07-04 ENCOUNTER — Other Ambulatory Visit: Payer: Self-pay

## 2019-07-04 ENCOUNTER — Ambulatory Visit: Payer: Medicare Other | Admitting: Physical Therapy

## 2019-07-04 DIAGNOSIS — M25562 Pain in left knee: Secondary | ICD-10-CM | POA: Diagnosis not present

## 2019-07-04 DIAGNOSIS — M25662 Stiffness of left knee, not elsewhere classified: Secondary | ICD-10-CM

## 2019-07-04 NOTE — Therapy (Signed)
[address] [place], [place], [address] Phone: [phone]   Fax:  [phone]  Physical Therapy Treatment  Patient Details  Name: Douglas Lane MRN: [id number] Date of Birth: 11/24/1945 Referring Provider (PT): Aluisio   Encounter Date: 07/04/2019  PT End of Session - 07/04/19 1102    Visit Number  9    PT Start Time  6063    PT Stop Time  1108    PT Time Calculation (min)  53 min       Past Medical History:  Diagnosis Date  . Aortic atherosclerosis ([place]) 03/08/2017   CT  . Aortic stenosis    severe AS s/p TAVR (03/22/17) with a 26m Edwards Sapien 3 THV via the TF approach  . Arthritis    "pain in my knees" (03/30/2016)  . Bilateral cataracts   . CAD S/P percutaneous coronary angioplasty 03/31/2016   a. CP/USA -> LHC 03/31/16: 90% prox-mid RCA s/p DES, 40% prox-mid LAD, 25% D2, 25% prox-mid Cx, elevated LVEDP 33.  . Chronic diastolic CHF (congestive heart failure) ([place] 03/31/2016   pt unaware, has family history  . Chronic lower back pain   . DDD (degenerative disc disease), lumbar   . Dyslipidemia   . Grade II diastolic dysfunction   . Hearing loss   . Heart murmur   . Heartburn    occ. takes TUMS  . Humeral fracture 07/2014   Right  . Kidney cysts    1.6 cm simple cyst right  . LVH (left ventricular hypertrophy)    a. severe basal septal hypertrophy by echo 03/2016.  . Mitral regurgitation    a. mild by echo 03/2016.  . Osteopenia 03/13/2009  . Psoriasis   . Rheumatic fever    as a child  . Scoliosis 03/13/2009  . Seizure ([place] 01/[place]; 07/2011; 07/2015   "idiopathic; been on Kepra since 07/2011", last seizure poss. 07/2015, absent seizure  . Sinus bradycardia - baseline HR 50s at times.   . 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 Thrombosed hemorrhoids     Past Surgical History:  Procedure Laterality Date  . APPENDECTOMY    . BACK SURGERY    . CARDIAC  CATHETERIZATION N/A 03/30/2016   Procedure: Right/Left Heart Cath and Coronary Angiography;  Surgeon: JJettie Booze MD;  [facility];  Service: Cardiovascular;  Laterality: N/A;  . CARDIAC CATHETERIZATION N/A 03/30/2016   Procedure: Coronary Stent Intervention;  Surgeon: JJettie Booze MD;  [facility];  Service: Cardiovascular;  Laterality: N/A;  . CATARACT EXTRACTION, BILATERAL    . COLONOSCOPY    . CORONARY ANGIOPLASTY    . FOREARM FRACTURE SURGERY Right ~ 2003-2004   "got steel plate in there"  . HERNIA REPAIR  2009  . I&D hemorrhoirds    . KNEE ARTHROSCOPY Bilateral   . MOHS SURGERY Left    temple  . ORIF SCAPULAR FRACTURE Right    right  . POSTERIOR LUMBAR FUSION  2004  . TEE WITHOUT CARDIOVERSION N/A 11/01/2016   Procedure: TRANSESOPHAGEAL ECHOCARDIOGRAM (TEE);  Surgeon: MSanda Klein MD;  [facility]  Service: Cardiovascular;  Laterality: N/A;  . TEE WITHOUT CARDIOVERSION N/A 03/22/2017   Procedure: TRANSESOPHAGEAL ECHOCARDIOGRAM (TEE);  Surgeon: CSherren Mocha MD;  [facility]  Service: Open Heart Surgery;  Laterality: N/A;  . TONSILLECTOMY    . TOTAL KNEE ARTHROPLASTY Right 07/24/2018  Procedure: RIGHT TOTAL KNEE ARTHROPLASTY;  Surgeon: Gaynelle Arabian, MD;  [facility];  Service: Orthopedics;  Laterality: Right;  66mn  . TOTAL KNEE ARTHROPLASTY Left 06/11/2019   Procedure: TOTAL KNEE ARTHROPLASTY;  Surgeon: AGaynelle Arabian MD;  [facility];  Service: Orthopedics;  Laterality: Left;  538m  . TRANSCATHETER AORTIC VALVE REPLACEMENT, TRANSFEMORAL N/A 03/22/2017   Procedure: TRANSCATHETER AORTIC VALVE REPLACEMENT, TRANSFEMORAL;  Surgeon: CoSherren MochaMD;  [facility] Service: Open Heart Surgery;  Laterality: N/A;    There were no vitals filed for this visit.  Subjective Assessment - 07/04/19 1020    Subjective  "feeling pretty good"    Currently in Pain?  Yes    Pain Score  2     Pain Location   Knee    Pain Orientation  Left                       [facility] Adult PT Treatment/Exercise - 07/04/19 0001      Ambulation/Gait   Gait Comments  2 flights of stairs, step over step      Knee/Hip Exercises: Aerobic   Nustep  level 5 x 6 minutes      Knee/Hip Exercises: Machines for Strengthening   Cybex Knee Extension  5# 2 x 10 LLE    Cybex Knee Flexion  20# 2x10 LLE    Cybex Leg Press  20# both legs working on increased flexion, 2x10, then left only 20#       Vasopneumatic   Number Minutes Vasopneumatic   15 minutes    Vasopnuematic Location   Knee    Vasopneumatic Pressure  Medium    Vasopneumatic Temperature   35      Manual Therapy   Manual Therapy  Passive ROM    Passive ROM  flexion and extension               PT Short Term Goals - 06/22/19 1101      PT SHORT TERM GOAL #1   Title  independent with intiial HEP    Status  Achieved        PT Long Term Goals - 07/02/19 1054      PT LONG TERM GOAL #1   Title  ambulate community distances without device or with SPC    Status  Partially Met      PT LONG TERM GOAL #2   Title  decrease pain 50%    Status  Partially Met      PT LONG TERM GOAL #3   Title  increase AROM to 3-115 degrees flexion    Status  Partially Met      PT LONG TERM GOAL #4   Title  increase strength to 4+/5    Status  Partially Met      PT LONG TERM GOAL #5   Title  decrease swelling 2 cm    Status  Partially Met            Plan - 07/04/19 1103    Clinical Impression Statement  pt able to go up and down the stairs. pt needs heavy UE supprt when going down stairs. pt able to perform knee flexion and extension with left leg only. pt needs cues with knee flexion and extension to improve form and technique. pt requires rest break secondary to fatigue.    Stability/Clinical Decision Making  Evolving/Moderate complexity    Rehab Potential  Good    PT Frequency  3x / week    PT Duration  8 weeks    PT  Treatment/Interventions  ADLs/Self Care Home Management;Electrical Stimulation;Gait training;Stair training;Functional mobility training;Therapeutic activities;Therapeutic exercise;Patient/family education;Neuromuscular re-education;Balance training;Manual techniques;Vasopneumatic Device;Passive range of motion    PT Next Visit Plan  work on gait and function       Patient will benefit from skilled therapeutic intervention in order to improve the following deficits and impairments:  Abnormal gait, Pain, Decreased scar mobility, Cardiopulmonary status limiting activity, Decreased activity tolerance, Decreased range of motion, Decreased strength, Decreased balance, Difficulty walking, Increased edema, Impaired flexibility  Visit Diagnosis: Stiffness of left knee, not elsewhere classified     Problem List Patient Active Problem List   Diagnosis Date Noted  . OA (osteoarthritis) of knee 07/24/2018  . Chronic diastolic heart failure ([place]) 11/13/2017  . Benign essential HTN 11/13/2017  . S/P TAVR (transcatheter aortic valve replacement) 03/22/2017  . Severe aortic stenosis 03/22/2017  . Bradycardia, drug induced 10/28/2016  . Dyslipidemia 03/31/2016  . Mitral regurgitation 03/31/2016  . CAD S/P percutaneous coronary angioplasty 03/31/2016  . Abnormal nuclear stress test   . Heart murmur 03/06/2015  . Abnormal EKG 03/06/2015  . LVH (left ventricular hypertrophy) 03/06/2015  . Rheumatic fever   . Localization-related idiopathic epilepsy and epileptic syndromes with seizures of localized onset, not intractable, without status epilepticus ([place]) 08/23/2014  . Hemorrhoid, thrombosed. 12/24/2011    Barrett Henle, [place] 07/04/2019, 11:06 AM  [address] [place], [place], [address] Phone: [phone]   Fax:  [phone]  Name: Douglas Lane MRN: [id number] Date of Birth: [birth year]

## 2019-07-05 ENCOUNTER — Other Ambulatory Visit: Payer: Self-pay | Admitting: Cardiology

## 2019-07-06 ENCOUNTER — Ambulatory Visit: Payer: Medicare Other | Admitting: Physical Therapy

## 2019-07-09 ENCOUNTER — Other Ambulatory Visit: Payer: Self-pay

## 2019-07-09 ENCOUNTER — Ambulatory Visit: Payer: Medicare Other | Admitting: Physical Therapy

## 2019-07-09 DIAGNOSIS — M25562 Pain in left knee: Secondary | ICD-10-CM | POA: Diagnosis not present

## 2019-07-09 DIAGNOSIS — R262 Difficulty in walking, not elsewhere classified: Secondary | ICD-10-CM

## 2019-07-09 DIAGNOSIS — M25662 Stiffness of left knee, not elsewhere classified: Secondary | ICD-10-CM

## 2019-07-09 NOTE — Therapy (Addendum)
[address] [place], [place], [address] Phone: [phone]   Fax:  [phone] Progress Note Reporting Period 06/18/19 to 07/09/19 for the first 10 visits  See note below for Objective Data and Assessment of Progress/Goals.      Physical Therapy Treatment  Patient Details  Name: Douglas Lane MRN: [id number] Date of Birth: [birth year] Referring Provider (PT): Aluisio   Encounter Date: 07/09/2019  PT End of Session - 07/09/19 1052    Visit Number  10    PT Start Time  5732    PT Stop Time  1102    PT Time Calculation (min)  47 min       Past Medical History:  Diagnosis Date  . Aortic atherosclerosis ([place]) 03/08/2017   CT  . Aortic stenosis    severe AS s/p TAVR (03/22/17) with a 62m Edwards Sapien 3 THV via the TF approach  . Arthritis    "pain in my knees" (03/30/2016)  . Bilateral cataracts   . CAD S/P percutaneous coronary angioplasty 03/31/2016   a. CP/[place] -> LHC 03/31/16: 90% prox-mid RCA s/p DES, 40% prox-mid LAD, 25% D2, 25% prox-mid Cx, elevated LVEDP 33.  . Chronic diastolic CHF (congestive heart failure) ([place] 03/31/2016   pt unaware, has family history  . Chronic lower back pain   . DDD (degenerative disc disease), lumbar   . Dyslipidemia   . Grade II diastolic dysfunction   . Hearing loss   . Heart murmur   . Heartburn    occ. takes TUMS  . Humeral fracture 07/2014   Right  . Kidney cysts    1.6 cm simple cyst right  . LVH (left ventricular hypertrophy)    a. severe basal septal hypertrophy by echo 03/2016.  . Mitral regurgitation    a. mild by echo 03/2016.  . Osteopenia 03/13/2009  . Psoriasis   . Rheumatic fever    as a child  . Scoliosis 03/13/2009  . Seizure ([place] 01/2011; 07/2011; 07/2015   "idiopathic; been on Kepra since 07/2011", last seizure poss. 07/2015, absent seizure  . Sinus bradycardia - baseline HR 50s at times.   . Squamous cell cancer of skin of left  temple    skin-Mohr's   . Thrombosed hemorrhoids     Past Surgical History:  Procedure Laterality Date  . APPENDECTOMY    . BACK SURGERY    . CARDIAC CATHETERIZATION N/A 03/30/2016   Procedure: Right/Left Heart Cath and Coronary Angiography;  Surgeon: JJettie Booze MD;  [facility];  Service: Cardiovascular;  Laterality: N/A;  . CARDIAC CATHETERIZATION N/A 03/30/2016   Procedure: Coronary Stent Intervention;  Surgeon: JJettie Booze MD;  [facility];  Service: Cardiovascular;  Laterality: N/A;  . CATARACT EXTRACTION, BILATERAL    . COLONOSCOPY    . CORONARY ANGIOPLASTY    . FOREARM FRACTURE SURGERY Right ~ 2003-2004   "got steel plate in there"  . HERNIA REPAIR  2009  . I&D hemorrhoirds    . KNEE ARTHROSCOPY Bilateral   . MOHS SURGERY Left    temple  . ORIF SCAPULAR FRACTURE Right    right  . POSTERIOR LUMBAR FUSION  2004  . TEE WITHOUT CARDIOVERSION N/A 11/01/2016   Procedure: TRANSESOPHAGEAL ECHOCARDIOGRAM (TEE);  Surgeon: MSanda Klein MD;  [facility]  Service: Cardiovascular;  Laterality: N/A;  . TEE WITHOUT CARDIOVERSION N/A 03/22/2017   Procedure: TRANSESOPHAGEAL ECHOCARDIOGRAM (TEE);  Surgeon:  Sherren Mocha, MD;  Location: [facility] [place];  Service: Open Heart Surgery;  Laterality: N/A;  . TONSILLECTOMY    . TOTAL KNEE ARTHROPLASTY Right 07/24/2018   Procedure: RIGHT TOTAL KNEE ARTHROPLASTY;  Surgeon: Gaynelle Arabian, MD;  [facility];  Service: Orthopedics;  Laterality: Right;  5mn  . TOTAL KNEE ARTHROPLASTY Left 06/11/2019   Procedure: TOTAL KNEE ARTHROPLASTY;  Surgeon: AGaynelle Arabian MD;  [facility];  Service: Orthopedics;  Laterality: Left;  551m  . TRANSCATHETER AORTIC VALVE REPLACEMENT, TRANSFEMORAL N/A 03/22/2017   Procedure: TRANSCATHETER AORTIC VALVE REPLACEMENT, TRANSFEMORAL;  Surgeon: CoSherren MochaMD;  [facility] Service: Open Heart Surgery;  Laterality: N/A;    There were no vitals filed  for this visit.  Subjective Assessment - 07/09/19 1019    Subjective  "feeling okay"    Currently in Pain?  Yes    Pain Score  2     Pain Location  Knee    Pain Orientation  Left                       [facility] Adult PT Treatment/Exercise - 07/09/19 0001      Ambulation/Gait   Ambulation/Gait  Yes    Ambulation/Gait Assistance  7: Independent    Assistive device  None    Gait Pattern  Antalgic    Gait Comments  2 flights of stairs, step over step      Knee/Hip Exercises: Aerobic   Nustep  level 5 x 6 minutes      Knee/Hip Exercises: Machines for Strengthening   Cybex Knee Extension  5# 3 x 10 LLE   10 with eccentric lowering   Cybex Knee Flexion  35# 2x10 both    Cybex Leg Press  20# both legs working on increased flexion, 2x10, then left only 20#       Cryotherapy   Number Minutes Cryotherapy  10 Minutes    Cryotherapy Location  Knee    Type of Cryotherapy  Ice pack      Manual Therapy   Manual Therapy  Passive ROM    Passive ROM  flexion and extension               PT Short Term Goals - 06/22/19 1101      PT SHORT TERM GOAL #1   Title  independent with intiial HEP    Status  Achieved        PT Long Term Goals - 07/09/19 1056      PT LONG TERM GOAL #1   Title  ambulate community distances without device or with SPC    Status  Achieved      PT LONG TERM GOAL #2   Title  decrease pain 50%    Status  Partially Met      PT LONG TERM GOAL #3   Title  increase AROM to 3-115 degrees flexion    Status  Partially Met      PT LONG TERM GOAL #4   Title  increase strength to 4+/5    Status  Partially Met            Plan - 07/09/19 1053    Clinical Impression Statement  pt able to walk down the hall. he has limited knee flexion on the left side. pt still needs UE support to go up and down the stairs. pt increased weight with knee flexion. pt needs cues with knee extension to improve form and  technique. pt needs cues to relax with PROM.     Stability/Clinical Decision Making  Evolving/Moderate complexity    Rehab Potential  Good    PT Frequency  3x / week    PT Duration  8 weeks    PT Treatment/Interventions  ADLs/Self Care Home Management;Electrical Stimulation;Gait training;Stair training;Functional mobility training;Therapeutic activities;Therapeutic exercise;Patient/family education;Neuromuscular re-education;Balance training;Manual techniques;Vasopneumatic Device;Passive range of motion       Patient will benefit from skilled therapeutic intervention in order to improve the following deficits and impairments:  Abnormal gait, Pain, Decreased scar mobility, Cardiopulmonary status limiting activity, Decreased activity tolerance, Decreased range of motion, Decreased strength, Decreased balance, Difficulty walking, Increased edema, Impaired flexibility  Visit Diagnosis: Stiffness of left knee, not elsewhere classified  Difficulty in walking, not elsewhere classified     Problem List Patient Active Problem List   Diagnosis Date Noted  . OA (osteoarthritis) of knee 07/24/2018  . Chronic diastolic heart failure ([place]) 11/13/2017  . Benign essential HTN 11/13/2017  . S/P TAVR (transcatheter aortic valve replacement) 03/22/2017  . Severe aortic stenosis 03/22/2017  . Bradycardia, drug induced 10/28/2016  . Dyslipidemia 03/31/2016  . Mitral regurgitation 03/31/2016  . CAD S/P percutaneous coronary angioplasty 03/31/2016  . Abnormal nuclear stress test   . Heart murmur 03/06/2015  . Abnormal EKG 03/06/2015  . LVH (left ventricular hypertrophy) 03/06/2015  . Rheumatic fever   . Localization-related idiopathic epilepsy and epileptic syndromes with seizures of localized onset, not intractable, without status epilepticus ([place]) 08/23/2014  . Hemorrhoid, thrombosed. 12/24/2011    Barrett Henle, [place] 07/09/2019, 10:58 AM  [address]  [address] [place], [place], [address] Phone: [phone]   Fax:  [phone]  Name: Douglas Lane MRN: [id number] Date of Birth: Sep 26, 1945

## 2019-07-10 ENCOUNTER — Encounter: Payer: Self-pay | Admitting: Physical Therapy

## 2019-07-10 ENCOUNTER — Ambulatory Visit: Payer: Medicare Other | Admitting: Physical Therapy

## 2019-07-10 DIAGNOSIS — M25562 Pain in left knee: Secondary | ICD-10-CM

## 2019-07-10 DIAGNOSIS — R600 Localized edema: Secondary | ICD-10-CM

## 2019-07-10 DIAGNOSIS — M25662 Stiffness of left knee, not elsewhere classified: Secondary | ICD-10-CM

## 2019-07-10 DIAGNOSIS — R262 Difficulty in walking, not elsewhere classified: Secondary | ICD-10-CM

## 2019-07-10 NOTE — Therapy (Signed)
[address] [place], [place], [address] Phone: [phone]   Fax:  [phone]  Physical Therapy Treatment  Patient Details  Name: Douglas Lane MRN: [id number] Date of Birth: 19-Dec-1945 Referring Provider (PT): Aluisio   Encounter Date: 07/10/2019  PT End of Session - 07/10/19 1058    Visit Number  11    Date for PT Re-Evaluation  08/18/19    Authorization Type  [employer]    PT Start Time  1013    PT Stop Time  1111    PT Time Calculation (min)  58 min    Activity Tolerance  Patient tolerated treatment well    Behavior During Therapy  [facility] for tasks assessed/performed       Past Medical History:  Diagnosis Date  . Aortic atherosclerosis ([place]) 03/08/2017   CT  . Aortic stenosis    severe AS s/p TAVR (03/22/17) with a 19m Edwards Sapien 3 THV via the TF approach  . Arthritis    "pain in my knees" (03/30/2016)  . Bilateral cataracts   . CAD S/P percutaneous coronary angioplasty 03/31/2016   a. CP/USA -> LHC 03/31/16: 90% prox-mid RCA s/p DES, 40% prox-mid LAD, 25% D2, 25% prox-mid Cx, elevated LVEDP 33.  . Chronic diastolic CHF ([place]estive heart failure) ([place] 03/31/2016   pt unaware, has family history  . Chronic lower back pain   . DDD (degenerative disc disease), lumbar   . Dyslipidemia   . Grade II diastolic dysfunction   . Hearing loss   . Heart murmur   . Heartburn    occ. takes TUMS  . Humeral fracture 07/2014   Right  . Kidney cysts    1.6 cm simple cyst right  . LVH (left ventricular hypertrophy)    a. severe basal septal hypertrophy by echo 03/2016.  . Mitral regurgitation    a. mild by echo 03/2016.  . Osteopenia 03/13/2009  . Psoriasis   . Rheumatic fever    as a child  . Scoliosis 03/13/2009  . Seizure ([place] 01/2011; 07/2011; 07/2015   "idiopathic; been on Kepra since 07/2011", last seizure poss. 07/2015, absent seizure  . Sinus bradycardia - baseline HR 50s at times.   .  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 Thrombosed hemorrhoids     Past Surgical History:  Procedure Laterality Date  . APPENDECTOMY    . BACK SURGERY    . CARDIAC CATHETERIZATION N/A 03/30/2016   Procedure: Right/Left Heart Cath and Coronary Angiography;  Surgeon: JJettie Booze MD;  [facility];  Service: Cardiovascular;  Laterality: N/A;  . CARDIAC CATHETERIZATION N/A 03/30/2016   Procedure: Coronary Stent Intervention;  Surgeon: JJettie Booze MD;  [facility];  Service: Cardiovascular;  Laterality: N/A;  . CATARACT EXTRACTION, BILATERAL    . COLONOSCOPY    . CORONARY ANGIOPLASTY    . FOREARM FRACTURE SURGERY Right ~ 2003-2004   "got steel plate in there"  . HERNIA REPAIR  2009  . I&D hemorrhoirds    . KNEE ARTHROSCOPY Bilateral   . MOHS SURGERY Left    temple  . ORIF SCAPULAR FRACTURE Right    right  . POSTERIOR LUMBAR FUSION  2004  . TEE WITHOUT CARDIOVERSION N/A 11/01/2016   Procedure: TRANSESOPHAGEAL ECHOCARDIOGRAM (TEE);  Surgeon: MSanda Klein MD;  [facility];  Service: Cardiovascular;  Laterality: N/A;  . TEE WITHOUT CARDIOVERSION  N/A 03/22/2017   Procedure: TRANSESOPHAGEAL ECHOCARDIOGRAM (TEE);  Surgeon: Sherren Mocha, MD;  Location: [place];  Service: Open Heart Surgery;  Laterality: N/A;  . TONSILLECTOMY    . TOTAL KNEE ARTHROPLASTY Right 07/24/2018   Procedure: RIGHT TOTAL KNEE ARTHROPLASTY;  Surgeon: Gaynelle Arabian, MD;  [facility];  Service: Orthopedics;  Laterality: Right;  75mn  . TOTAL KNEE ARTHROPLASTY Left 06/11/2019   Procedure: TOTAL KNEE ARTHROPLASTY;  Surgeon: AGaynelle Arabian MD;  [facility];  Service: Orthopedics;  Laterality: Left;  555m  . TRANSCATHETER AORTIC VALVE REPLACEMENT, TRANSFEMORAL N/A 03/22/2017   Procedure: TRANSCATHETER AORTIC VALVE REPLACEMENT, TRANSFEMORAL;  Surgeon: CoSherren MochaMD;  [facility] Service: Open Heart Surgery;  Laterality:  N/A;    There were no vitals filed for this visit.  Subjective Assessment - 07/10/19 1010    Subjective  Patient reports that his biggest issue is pain at night, really affecting his sleep    Currently in Pain?  Yes    Pain Score  2     Pain Location  Knee    Pain Orientation  Left    Aggravating Factors   trying to sleep, worst at night                       [facility] 07/10/19 0001      Ambulation/Gait   Gait Comments  2 flights of stairs, step over step cues for good eccentric control      Exercises   Exercises  Knee/Hip      Knee/Hip Exercises: Aerobic   Elliptical  R=5 I=10 x 2 minutes    Recumbent Bike  L2 x 6 min     Nustep  level 5 x 6 minutes no arms      Knee/Hip Exercises: Standing   Lateral Step Up  20 reps;Hand Hold: 1;Step Height: 4";Step Height: 6"    Lateral Step Up Limitations  without toe assist had to do with heel touch only    Forward Step Up  Step Height: 8";10 reps    Step Down  Step Height: 8";10 reps      Vasopneumatic   Number Minutes Vasopneumatic   15 minutes    Vasopnuematic Location   Knee    Vasopneumatic Pressure  Medium    Vasopneumatic Temperature   40               PT Short Term Goals - 06/22/19 1101      PT SHORT TERM GOAL #1   Title  independent with intiial HEP    Status  Achieved        PT Long Term Goals - 07/10/19 1107      PT LONG TERM GOAL #1   Title  ambulate community distances without device or with SPC    Status  Achieved      PT LONG TERM GOAL #2   Title  decrease pain 50%    Status  Partially Met            Plan - 07/10/19 1106    Clinical Impression Statement  Patient has the most difficulty with cardio and with eccentric control on the stairs.  The stairs are painful going down and the eccentric control is poor.  Working on how to strengthen this    PT Next Visit Plan  write MD note next visit    Consulted and Agree with Plan of Care  Patient  Patient will benefit from skilled therapeutic intervention in order to improve the following deficits and impairments:  Abnormal gait, Pain, Decreased scar mobility, Cardiopulmonary status limiting activity, Decreased activity tolerance, Decreased range of motion, Decreased strength, Decreased balance, Difficulty walking, Increased edema, Impaired flexibility  Visit Diagnosis: Stiffness of left knee, not elsewhere classified  Difficulty in walking, not elsewhere classified  Acute pain of left knee  Localized edema     Problem List Patient Active Problem List   Diagnosis Date Noted  . OA (osteoarthritis) of knee 07/24/2018  . Chronic diastolic heart failure ([place]) 11/13/2017  . Benign essential HTN 11/13/2017  . S/P TAVR (transcatheter aortic valve replacement) 03/22/2017  . Severe aortic stenosis 03/22/2017  . Bradycardia, drug induced 10/28/2016  . Dyslipidemia 03/31/2016  . Mitral regurgitation 03/31/2016  . CAD S/P percutaneous coronary angioplasty 03/31/2016  . Abnormal nuclear stress test   . Heart murmur 03/06/2015  . Abnormal EKG 03/06/2015  . LVH (left ventricular hypertrophy) 03/06/2015  . Rheumatic fever   . Localization-related idiopathic epilepsy and epileptic syndromes with seizures of localized onset, not intractable, without status epilepticus ([place]) 08/23/2014  . Hemorrhoid, thrombosed. 12/24/2011    Sumner Boast., PT 07/10/2019, 11:53 AM  [address] [place], [place], [address] Phone: [phone]   Fax:  [phone]  Name: Douglas Lane MRN: [id number] Date of Birth: 01-19-1946

## 2019-07-11 ENCOUNTER — Ambulatory Visit: Payer: Medicare Other | Admitting: Physical Therapy

## 2019-07-16 ENCOUNTER — Ambulatory Visit: Payer: Medicare Other | Admitting: Physical Therapy

## 2019-07-16 ENCOUNTER — Other Ambulatory Visit: Payer: Self-pay

## 2019-07-16 DIAGNOSIS — M25562 Pain in left knee: Secondary | ICD-10-CM | POA: Diagnosis not present

## 2019-07-16 DIAGNOSIS — M25662 Stiffness of left knee, not elsewhere classified: Secondary | ICD-10-CM

## 2019-07-16 NOTE — Therapy (Signed)
[place] [address] [place], [place], [address] Phone: [phone]   Fax:  [phone]  Physical Therapy Treatment  Patient Details  Name: Douglas Lane MRN: [id number] Date of Birth: June 04, 1946 Referring Provider (PT): Aluisio   Encounter Date: 07/16/2019  PT End of Session - 07/16/19 1552    Visit Number  12    PT Start Time  4287    PT Stop Time  6811    PT Time Calculation (min)  44 min       Past Medical History:  Diagnosis Date  . Aortic atherosclerosis ([place]) 03/08/2017   CT  . Aortic stenosis    severe AS s/p TAVR (03/22/17) with a 36m Edwards Sapien 3 THV via the TF approach  . Arthritis    "pain in my knees" (03/30/2016)  . Bilateral cataracts   . CAD S/P percutaneous coronary angioplasty 03/31/2016   a. CP/USA -> LHC 03/31/16: 90% prox-mid RCA s/p DES, 40% prox-mid LAD, 25% D2, 25% prox-mid Cx, elevated LVEDP 33.  . Chronic diastolic CHF (congestive heart failure) ([place] 03/31/2016   pt unaware, has family history  . Chronic lower back pain   . DDD (degenerative disc disease), lumbar   . Dyslipidemia   . Grade II diastolic dysfunction   . Hearing loss   . Heart murmur   . Heartburn    occ. takes TUMS  . Humeral fracture 07/2014   Right  . Kidney cysts    1.6 cm simple cyst right  . LVH (left ventricular hypertrophy)    a. severe basal septal hypertrophy by echo 03/2016.  . Mitral regurgitation    a. mild by echo 03/2016.  . Osteopenia 03/13/2009  . Psoriasis   . Rheumatic fever    as a child  . Scoliosis 03/13/2009  . Seizure ([place] 01/2011; 07/2011; 07/2015   "idiopathic; been on Kepra since 07/2011", last seizure poss. 07/2015, absent seizure  . Sinus bradycardia - baseline HR 50s at times.   . 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 Thrombosed hemorrhoids     Past Surgical History:  Procedure Laterality Date  . APPENDECTOMY    . BACK SURGERY    . CARDIAC  CATHETERIZATION N/A 03/30/2016   Procedure: Right/Left Heart Cath and Coronary Angiography;  Surgeon: JJettie Booze MD;  [facility];  Service: Cardiovascular;  Laterality: N/A;  . CARDIAC CATHETERIZATION N/A 03/30/2016   Procedure: Coronary Stent Intervention;  Surgeon: JJettie Booze MD;  [facility];  Service: Cardiovascular;  Laterality: N/A;  . CATARACT EXTRACTION, BILATERAL    . COLONOSCOPY    . CORONARY ANGIOPLASTY    . FOREARM FRACTURE SURGERY Right ~ 2003-2004   "got steel plate in there"  . HERNIA REPAIR  2009  . I&D hemorrhoirds    . KNEE ARTHROSCOPY Bilateral   . MOHS SURGERY Left    temple  . ORIF SCAPULAR FRACTURE Right    right  . POSTERIOR LUMBAR FUSION  2004  . TEE WITHOUT CARDIOVERSION N/A 11/01/2016   Procedure: TRANSESOPHAGEAL ECHOCARDIOGRAM (TEE);  Surgeon: MSanda Klein MD;  [facility]  Service: Cardiovascular;  Laterality: N/A;  . TEE WITHOUT CARDIOVERSION N/A 03/22/2017   Procedure: TRANSESOPHAGEAL ECHOCARDIOGRAM (TEE);  Surgeon: CSherren Mocha MD;  [facility]  Service: Open Heart Surgery;  Laterality: N/A;  . TONSILLECTOMY    . TOTAL KNEE ARTHROPLASTY Right 07/24/2018  Procedure: RIGHT TOTAL KNEE ARTHROPLASTY;  Surgeon: Gaynelle Arabian, MD;  [facility];  Service: Orthopedics;  Laterality: Right;  60mn  . TOTAL KNEE ARTHROPLASTY Left 06/11/2019   Procedure: TOTAL KNEE ARTHROPLASTY;  Surgeon: AGaynelle Arabian MD;  [facility];  Service: Orthopedics;  Laterality: Left;  526m  . TRANSCATHETER AORTIC VALVE REPLACEMENT, TRANSFEMORAL N/A 03/22/2017   Procedure: TRANSCATHETER AORTIC VALVE REPLACEMENT, TRANSFEMORAL;  Surgeon: CoSherren MochaMD;  [facility] Service: Open Heart Surgery;  Laterality: N/A;    There were no vitals filed for this visit.  Subjective Assessment - 07/16/19 1514    Subjective  "doing okay"    Currently in Pain?  Yes    Pain Score  3     Pain Location  Knee     Pain Orientation  Left         [facility] PT Assessment - 07/16/19 0001      Circumferential Edema   Circumferential - Left   42      AROM   Left Knee Extension  3    Left Knee Flexion  113                   [facility] Adult PT Treatment/Exercise - 07/16/19 0001      Ambulation/Gait   Gait Comments  2 flights of stairs, step over step cues for good eccentric control      Knee/Hip Exercises: Aerobic   Elliptical  R=5 I=10 x 2 minutes fwd/ 1 min back    Recumbent Bike  L2 x 6 min       Knee/Hip Exercises: Machines for Strengthening   Cybex Knee Extension  5# 2 x 10 LLE, LLE eccentrically x 10    Cybex Knee Flexion  35# 2x10 both    Cybex Leg Press  no weight , work on increasing flexion of L knee, 2 x 10      Knee/Hip Exercises: Standing   Step Down  Left;10 reps;Step Height: 4";Hand Hold: 1      Manual Therapy   Manual Therapy  Passive ROM    Passive ROM  flexion and extension               PT Short Term Goals - 06/22/19 1101      PT SHORT TERM GOAL #1   Title  independent with intiial HEP    Status  Achieved        PT Long Term Goals - 07/16/19 1514      PT LONG TERM GOAL #2   Title  decrease pain 50%    Status  Achieved      PT LONG TERM GOAL #3   Title  increase AROM to 3-115 degrees flexion    Status  Partially Met      PT LONG TERM GOAL #4   Title  increase strength to 4+/5    Status  Achieved      PT LONG TERM GOAL #5   Title  decrease swelling 2 cm    Status  Achieved            Plan - 07/16/19 1553    Clinical Impression Statement  pt  has met all goals, except ROM. pt's MMT is strong up he is functional weakness. pt has quad weakness when going down the stairs. pt needs cues to strengthen his L quad eccentrically with knee extension to improve form and technique. pt needs rest breaks between exercises secondary to fatigue and  SOB.    Stability/Clinical Decision Making  Evolving/Moderate complexity    Rehab Potential  Good     PT Frequency  3x / week    PT Duration  8 weeks    PT Treatment/Interventions  ADLs/Self Care Home Management;Electrical Stimulation;Gait training;Stair training;Functional mobility training;Therapeutic activities;Therapeutic exercise;Patient/family education;Neuromuscular re-education;Balance training;Manual techniques;Vasopneumatic Device;Passive range of motion    PT Next Visit Plan  pending on doctor visit tomorrow       Patient will benefit from skilled therapeutic intervention in order to improve the following deficits and impairments:  Abnormal gait, Pain, Decreased scar mobility, Cardiopulmonary status limiting activity, Decreased activity tolerance, Decreased range of motion, Decreased strength, Decreased balance, Difficulty walking, Increased edema, Impaired flexibility  Visit Diagnosis: Stiffness of left knee, not elsewhere classified     Problem List Patient Active Problem List   Diagnosis Date Noted  . OA (osteoarthritis) of knee 07/24/2018  . Chronic diastolic heart failure ([place]) 11/13/2017  . Benign essential HTN 11/13/2017  . S/P TAVR (transcatheter aortic valve replacement) 03/22/2017  . Severe aortic stenosis 03/22/2017  . Bradycardia, drug induced 10/28/2016  . Dyslipidemia 03/31/2016  . Mitral regurgitation 03/31/2016  . CAD S/P percutaneous coronary angioplasty 03/31/2016  . Abnormal nuclear stress test   . Heart murmur 03/06/2015  . Abnormal EKG 03/06/2015  . LVH (left ventricular hypertrophy) 03/06/2015  . Rheumatic fever   . Localization-related idiopathic epilepsy and epileptic syndromes with seizures of localized onset, not intractable, without status epilepticus ([place]) 08/23/2014  . Hemorrhoid, thrombosed. 12/24/2011    Barrett Henle, [place] 07/16/2019, 3:57 PM  [place] [address] [place], [place], [address] Phone: [phone]   Fax:  [phone]  Name: Douglas Lane MRN:  [id number] Date of Birth: [birth year]

## 2019-07-20 ENCOUNTER — Other Ambulatory Visit: Payer: Self-pay

## 2019-07-20 ENCOUNTER — Ambulatory Visit: Payer: Medicare Other | Attending: Orthopedic Surgery | Admitting: Physical Therapy

## 2019-07-20 ENCOUNTER — Ambulatory Visit: Payer: Medicare Other | Admitting: Physical Therapy

## 2019-07-20 DIAGNOSIS — M25562 Pain in left knee: Secondary | ICD-10-CM | POA: Insufficient documentation

## 2019-07-20 DIAGNOSIS — M25662 Stiffness of left knee, not elsewhere classified: Secondary | ICD-10-CM | POA: Insufficient documentation

## 2019-07-20 DIAGNOSIS — R600 Localized edema: Secondary | ICD-10-CM | POA: Insufficient documentation

## 2019-07-20 DIAGNOSIS — R262 Difficulty in walking, not elsewhere classified: Secondary | ICD-10-CM | POA: Diagnosis present

## 2019-07-20 NOTE — Therapy (Signed)
[place] [address] [place], [place], [address] Phone: [phone]   Fax:  [phone]  Physical Therapy Treatment  Patient Details  Name: Douglas Lane MRN: [id number] Date of Birth: [birth year] Referring Provider (PT): Aluisio   Encounter Date: 07/20/2019  PT End of Session - 07/20/19 1142    Visit Number  13    Date for PT Re-Evaluation  08/18/19    PT Start Time  1100    PT Stop Time  1140    PT Time Calculation (min)  40 min       Past Medical History:  Diagnosis Date  . Aortic atherosclerosis ([place]) 03/08/2017   CT  . Aortic stenosis    severe AS s/p TAVR (03/22/17) with a 55m Edwards Sapien 3 THV via the TF approach  . Arthritis    "pain in my knees" (03/30/2016)  . Bilateral cataracts   . CAD S/P percutaneous coronary angioplasty 03/31/2016   a. CP/USA -> LHC 03/31/16: 90% prox-mid RCA s/p DES, 40% prox-mid LAD, 25% D2, 25% prox-mid Cx, elevated LVEDP 33.  . Chronic diastolic CHF (congestive heart failure) ([place] 03/31/2016   pt unaware, has family history  . Chronic lower back pain   . DDD (degenerative disc disease), lumbar   . Dyslipidemia   . Grade II diastolic dysfunction   . Hearing loss   . Heart murmur   . Heartburn    occ. takes TUMS  . Humeral fracture 07/2014   Right  . Kidney cysts    1.6 cm simple cyst right  . LVH (left ventricular hypertrophy)    a. severe basal septal hypertrophy by echo 03/2016.  . Mitral regurgitation    a. mild by echo 03/2016.  . Osteopenia 03/13/2009  . Psoriasis   . Rheumatic fever    as a child  . Scoliosis 03/13/2009  . Seizure ([place] 01/2011; 07/2011; 07/2015   "idiopathic; been on Kepra since 07/2011", last seizure poss. 07/2015, absent seizure  . Sinus bradycardia - baseline HR 50s at times.   . 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 Thrombosed hemorrhoids     Past Surgical History:  Procedure Laterality Date  .  APPENDECTOMY    . BACK SURGERY    . CARDIAC CATHETERIZATION N/A 03/30/2016   Procedure: Right/Left Heart Cath and Coronary Angiography;  Surgeon: JJettie Booze MD;  [facility];  Service: Cardiovascular;  Laterality: N/A;  . CARDIAC CATHETERIZATION N/A 03/30/2016   Procedure: Coronary Stent Intervention;  Surgeon: JJettie Booze MD;  [facility];  Service: Cardiovascular;  Laterality: N/A;  . CATARACT EXTRACTION, BILATERAL    . COLONOSCOPY    . CORONARY ANGIOPLASTY    . FOREARM FRACTURE SURGERY Right ~ 2003-2004   "got steel plate in there"  . HERNIA REPAIR  2009  . I&D hemorrhoirds    . KNEE ARTHROSCOPY Bilateral   . MOHS SURGERY Left    temple  . ORIF SCAPULAR FRACTURE Right    right  . POSTERIOR LUMBAR FUSION  2004  . TEE WITHOUT CARDIOVERSION N/A 11/01/2016   Procedure: TRANSESOPHAGEAL ECHOCARDIOGRAM (TEE);  Surgeon: MSanda Klein MD;  [facility]  Service: Cardiovascular;  Laterality: N/A;  . TEE WITHOUT CARDIOVERSION N/A 03/22/2017   Procedure: TRANSESOPHAGEAL ECHOCARDIOGRAM (TEE);  Surgeon: CSherren Mocha MD;  [facility]  Service: Open Heart Surgery;  Laterality: N/A;  . TONSILLECTOMY    .  TOTAL KNEE ARTHROPLASTY Right 07/24/2018   Procedure: RIGHT TOTAL KNEE ARTHROPLASTY;  Surgeon: Gaynelle Arabian, MD;  [facility];  Service: Orthopedics;  Laterality: Right;  51mn  . TOTAL KNEE ARTHROPLASTY Left 06/11/2019   Procedure: TOTAL KNEE ARTHROPLASTY;  Surgeon: AGaynelle Arabian MD;  [facility];  Service: Orthopedics;  Laterality: Left;  595m  . TRANSCATHETER AORTIC VALVE REPLACEMENT, TRANSFEMORAL N/A 03/22/2017   Procedure: TRANSCATHETER AORTIC VALVE REPLACEMENT, TRANSFEMORAL;  Surgeon: CoSherren MochaMD;  [facility] Service: Open Heart Surgery;  Laterality: N/A;    There were no vitals filed for this visit.  Subjective Assessment - 07/20/19 1059    Subjective  overall MD pleased, drew off 50 cc fluid.  weakness    Currently in Pain?  Yes    Pain Score  1     Pain Location  Knee    Pain Orientation  Left         [facility] PT Assessment - 07/20/19 0001      Circumferential Edema   Circumferential - Left   36 cm - distal patella   pt points to were prior measure was taken     AROM   Left Knee Extension  1                   [facility] Adult PT Treatment/Exercise - 07/20/19 0001      Knee/Hip Exercises: Aerobic   Elliptical  R=5 I=10 x 2 minutes fwd/ 1 min back    Recumbent Bike  L 4 5 min LE only      Knee/Hip Exercises: Machines for Strengthening   Cybex Knee Extension  5# LLE only 10 reps hold 3 sec TKE   10# BIL Up left down 10x eccentrically   Cybex Knee Flexion  35# 2x10 both   25# left only 10 x   Cybex Leg Press  50# 2 sets 10, 20# LLE only 10x, 10 x LLE no wt ROM      Knee/Hip Exercises: Standing   Heel Raises  Both;15 reps   black bar   Step Down  Left;1 set;10 reps;Hand Hold: 1;Step Height: 6"   up over and back   Step Down Limitations  step down 4in 10 x, 10x 6 inch      Knee/Hip Exercises: Seated   Other Seated Knee/Hip Exercises  TKE long sitting green tband 2 set 10    Sit to Sand  10 reps;without UE support   on airex              PT Short Term Goals - 06/22/19 1101      PT SHORT TERM GOAL #1   Title  independent with intiial HEP    Status  Achieved        PT Long Term Goals - 07/16/19 1514      PT LONG TERM GOAL #2   Title  decrease pain 50%    Status  Achieved      PT LONG TERM GOAL #3   Title  increase AROM to 3-115 degrees flexion    Status  Partially Met      PT LONG TERM GOAL #4   Title  increase strength to 4+/5    Status  Achieved      PT LONG TERM GOAL #5   Title  decrease swelling 2 cm    Status  Achieved            Plan - 07/20/19 1142  Clinical Impression Statement  5 cm decrease in circumference after fluid drained, act ext at end of session 1. pt doing well overall but decreased TKE and eccentric  weakness noted. cuing and HHA needed with standing step work    PT Treatment/Interventions  ADLs/Self Care Home Management;Electrical Stimulation;Gait training;Stair training;Functional mobility training;Therapeutic activities;Therapeutic exercise;Patient/family education;Neuromuscular re-education;Balance training;Manual techniques;Vasopneumatic Device;Passive range of motion    PT Next Visit Plan  renew 2 x wk fr 3-4 weeks       Patient will benefit from skilled therapeutic intervention in order to improve the following deficits and impairments:  Abnormal gait, Pain, Decreased scar mobility, Cardiopulmonary status limiting activity, Decreased activity tolerance, Decreased range of motion, Decreased strength, Decreased balance, Difficulty walking, Increased edema, Impaired flexibility  Visit Diagnosis: Stiffness of left knee, not elsewhere classified  Difficulty in walking, not elsewhere classified  Acute pain of left knee  Localized edema     Problem List Patient Active Problem List   Diagnosis Date Noted  . OA (osteoarthritis) of knee 07/24/2018  . Chronic diastolic heart failure ([place]) 11/13/2017  . Benign essential HTN 11/13/2017  . S/P TAVR (transcatheter aortic valve replacement) 03/22/2017  . Severe aortic stenosis 03/22/2017  . Bradycardia, drug induced 10/28/2016  . Dyslipidemia 03/31/2016  . Mitral regurgitation 03/31/2016  . CAD S/P percutaneous coronary angioplasty 03/31/2016  . Abnormal nuclear stress test   . Heart murmur 03/06/2015  . Abnormal EKG 03/06/2015  . LVH (left ventricular hypertrophy) 03/06/2015  . Rheumatic fever   . Localization-related idiopathic epilepsy and epileptic syndromes with seizures of localized onset, not intractable, without status epilepticus ([place]) 08/23/2014  . Hemorrhoid, thrombosed. 12/24/2011    ,ANGIE PTA 07/20/2019, 11:44 AM  [address]  [place], [place], [address] Phone: [phone]   Fax:  [phone]  Name: Douglas Lane MRN: [id number] Date of Birth: Jan 02, 1946

## 2019-07-25 ENCOUNTER — Encounter: Payer: Self-pay | Admitting: Physical Therapy

## 2019-07-25 ENCOUNTER — Other Ambulatory Visit: Payer: Self-pay

## 2019-07-25 ENCOUNTER — Ambulatory Visit: Payer: Medicare Other | Admitting: Physical Therapy

## 2019-07-25 DIAGNOSIS — M25662 Stiffness of left knee, not elsewhere classified: Secondary | ICD-10-CM

## 2019-07-25 DIAGNOSIS — R262 Difficulty in walking, not elsewhere classified: Secondary | ICD-10-CM

## 2019-07-25 DIAGNOSIS — M25562 Pain in left knee: Secondary | ICD-10-CM

## 2019-07-25 DIAGNOSIS — R600 Localized edema: Secondary | ICD-10-CM

## 2019-07-25 NOTE — Therapy (Signed)
[name] [place], [place], [address] Phone: [phone]   Fax:  [phone]  Physical Therapy Treatment  Patient Details  Name: Douglas Lane MRN: [id number] Date of Birth: Dec 30, 1945 Referring Provider (PT): Aluisio   Encounter Date: 07/25/2019  PT End of Session - 07/25/19 1216    Visit Number  14    Date for PT Re-Evaluation  08/18/19    Authorization Type  [employer]    PT Start Time  1141    PT Stop Time  1216    PT Time Calculation (min)  35 min    Activity Tolerance  Patient tolerated treatment well       Past Medical History:  Diagnosis Date  . Aortic atherosclerosis ([place]) 03/08/2017   CT  . Aortic stenosis    severe AS s/p TAVR (03/22/17) with a 50m Edwards Sapien 3 THV via the TF approach  . Arthritis    "pain in my knees" (03/30/2016)  . Bilateral cataracts   . CAD S/P percutaneous coronary angioplasty 03/31/2016   a. CP/USA -> LHC 03/31/16: 90% prox-mid RCA s/p DES, 40% prox-mid LAD, 25% D2, 25% prox-mid Cx, elevated LVEDP 33.  . Chronic diastolic CHF (congestive heart failure) ([place] 03/31/2016   pt unaware, has family history  . Chronic lower back pain   . DDD (degenerative disc disease), lumbar   . Dyslipidemia   . Grade II diastolic dysfunction   . Hearing loss   . Heart murmur   . Heartburn    occ. takes TUMS  . Humeral fracture 07/2014   Right  . Kidney cysts    1.6 cm simple cyst right  . LVH (left ventricular hypertrophy)    a. severe basal septal hypertrophy by echo 03/2016.  . Mitral regurgitation    a. mild by echo 03/2016.  . Osteopenia 03/13/2009  . Psoriasis   . Rheumatic fever    as a child  . Scoliosis 03/13/2009  . Seizure ([place] 01/2011; 07/2011; 07/2015   "idiopathic; been on Kepra since 07/2011", last seizure poss. 07/2015, absent seizure  . Sinus bradycardia - baseline HR 50s at times.   . 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  Thrombosed hemorrhoids     Past Surgical History:  Procedure Laterality Date  . APPENDECTOMY    . BACK SURGERY    . CARDIAC CATHETERIZATION N/A 03/30/2016   Procedure: Right/Left Heart Cath and Coronary Angiography;  Surgeon: JJettie Booze MD;  [facility];  Service: Cardiovascular;  Laterality: N/A;  . CARDIAC CATHETERIZATION N/A 03/30/2016   Procedure: Coronary Stent Intervention;  Surgeon: JJettie Booze MD;  [facility];  Service: Cardiovascular;  Laterality: N/A;  . CATARACT EXTRACTION, BILATERAL    . COLONOSCOPY    . CORONARY ANGIOPLASTY    . FOREARM FRACTURE SURGERY Right ~ 2003-2004   "got steel plate in there"  . HERNIA REPAIR  2009  . I&D hemorrhoirds    . KNEE ARTHROSCOPY Bilateral   . MOHS SURGERY Left    temple  . ORIF SCAPULAR FRACTURE Right    right  . POSTERIOR LUMBAR FUSION  2004  . TEE WITHOUT CARDIOVERSION N/A 11/01/2016   Procedure: TRANSESOPHAGEAL ECHOCARDIOGRAM (TEE);  Surgeon: MSanda Klein MD;  [place]  Service: Cardiovascular;  Laterality: N/A;  . TEE WITHOUT CARDIOVERSION N/A 03/22/2017   Procedure: TRANSESOPHAGEAL ECHOCARDIOGRAM (TEE);  Surgeon: CBurt Knack  Legrand Como, MD;  Location: [place];  Service: Open Heart Surgery;  Laterality: N/A;  . TONSILLECTOMY    . TOTAL KNEE ARTHROPLASTY Right 07/24/2018   Procedure: RIGHT TOTAL KNEE ARTHROPLASTY;  Surgeon: Gaynelle Arabian, MD;  [facility];  Service: Orthopedics;  Laterality: Right;  88mn  . TOTAL KNEE ARTHROPLASTY Left 06/11/2019   Procedure: TOTAL KNEE ARTHROPLASTY;  Surgeon: AGaynelle Arabian MD;  [facility];  Service: Orthopedics;  Laterality: Left;  574m  . TRANSCATHETER AORTIC VALVE REPLACEMENT, TRANSFEMORAL N/A 03/22/2017   Procedure: TRANSCATHETER AORTIC VALVE REPLACEMENT, TRANSFEMORAL;  Surgeon: CoSherren MochaMD;  [facility] Service: Open Heart Surgery;  Laterality: N/A;    There were no vitals filed for this visit.  Subjective  Assessment - 07/25/19 1142    Subjective  Pt reports he is fine right now, not really doing his HEP as he doesn't feel like they are doing much for him and he doesn't really know which ones to do.  His biggest concerns are stairs right now.    Patient Stated Goals  have less pain, better motions, walk better    Currently in Pain?  No/denies         [facility]/25[facility]19 0001      Assessment   Medical Diagnosis  left TKA                   [facility] Adult PT Treatment/Exercise - 07/25/19 0001      Knee/Hip Exercises: Stretches   Passive Hamstring Stretch  Both;30 seconds   seateed   QuSports administratorBoth;2 reps;30 seconds   prone with strap     Knee/Hip Exercises: Aerobic   Recumbent Bike  L 4 5 min       Knee/Hip Exercises: Standing   Hip Flexion  --    Forward Lunges  Both;3 sets;10 reps   pulse mini   Forward Lunges Limitations  --      Knee/Hip Exercises: Seated   Sit to Sand  without UE support;3 sets;10 reps   single leg, each side, vc for form            PT Education - 07/25/19 1211    Education Details  HEP modification    Person(s) Educated  Patient    Methods  Explanation;Demonstration    Comprehension  Returned demonstration       PT Short Term Goals - 06/22/19 1101      PT SHORT TERM GOAL #1   Title  independent with intiial HEP    Status  Achieved        PT Long Term Goals - 07/16/19 1514      PT LONG TERM GOAL #2   Title  decrease pain 50%    Status  Achieved      PT LONG TERM GOAL #3   Title  increase AROM to 3-115 degrees flexion    Status  Partially Met      PT LONG TERM GOAL #4   Title  increase strength to 4+/5    Status  Achieved      PT LONG TERM GOAL #5   Title  decrease swelling 2 cm    Status  Achieved            Plan - 07/25/19 1350    Clinical Impression Statement  Pt presented today with reports that he really isnt' doing any exercise at home because he wasn't sure what to do now and  didn't  feel like what he had was helping any more.  He was issued new exercises to perform that address his biggest concern of ascending stairs with control.  He was fatigued after the exercise and advised that if he is really sore he needs to take a day rest from the new exercise d/t level of difficulty .    Rehab Potential  Good    PT Frequency  3x / week    PT Duration  8 weeks    PT Treatment/Interventions  ADLs/Self Care Home Management;Electrical Stimulation;Gait training;Stair training;Functional mobility training;Therapeutic activities;Therapeutic exercise;Patient/family education;Neuromuscular re-education;Balance training;Manual techniques;Vasopneumatic Device;Passive range of motion    PT Next Visit Plan  assess response to new HEP       Patient will benefit from skilled therapeutic intervention in order to improve the following deficits and impairments:  Abnormal gait, Pain, Decreased scar mobility, Cardiopulmonary status limiting activity, Decreased activity tolerance, Decreased range of motion, Decreased strength, Decreased balance, Difficulty walking, Increased edema, Impaired flexibility  Visit Diagnosis: Stiffness of left knee, not elsewhere classified  Difficulty in walking, not elsewhere classified  Acute pain of left knee  Localized edema     Problem List Patient Active Problem List   Diagnosis Date Noted  . OA (osteoarthritis) of knee 07/24/2018  . Chronic diastolic heart failure ([place]) 11/13/2017  . Benign essential HTN 11/13/2017  . S/P TAVR (transcatheter aortic valve replacement) 03/22/2017  . Severe aortic stenosis 03/22/2017  . Bradycardia, drug induced 10/28/2016  . Dyslipidemia 03/31/2016  . Mitral regurgitation 03/31/2016  . CAD S/P percutaneous coronary angioplasty 03/31/2016  . Abnormal nuclear stress test   . Heart murmur 03/06/2015  . Abnormal EKG 03/06/2015  . LVH (left ventricular hypertrophy) 03/06/2015  . Rheumatic fever   . Localization-related  idiopathic epilepsy and epileptic syndromes with seizures of localized onset, not intractable, without status epilepticus ([place]) 08/23/2014  . Hemorrhoid, thrombosed. 12/24/2011    Jeral Pinch PT 07/25/2019, 1:53 PM  [address] [place], [place], [address] Phone: [phone]   Fax:  [phone]  Name: ANTE ARREDONDO MRN: [id number] Date of Birth: 03/11/1946

## 2019-07-27 ENCOUNTER — Encounter

## 2019-07-30 ENCOUNTER — Ambulatory Visit: Payer: Medicare Other | Admitting: Physical Therapy

## 2019-07-30 ENCOUNTER — Other Ambulatory Visit: Payer: Self-pay

## 2019-07-30 ENCOUNTER — Encounter: Payer: Self-pay | Admitting: Physical Therapy

## 2019-07-30 DIAGNOSIS — M25662 Stiffness of left knee, not elsewhere classified: Secondary | ICD-10-CM

## 2019-07-30 NOTE — Therapy (Signed)
[address] [place], [place], [address] Phone: [phone]   Fax:  [phone]  Physical Therapy Treatment  Patient Details  Name: Douglas Lane MRN: [id number] Date of Birth: 01-Jun-1946 Referring Provider (PT): Aluisio   Encounter Date: 07/30/2019  PT End of Session - 07/30/19 1136    Visit Number  15    Date for PT Re-Evaluation  08/18/19    Authorization Type  U[employer]-MC    PT Start Time  1058    PT Stop Time  1150    PT Time Calculation (min)  52 min       Past Medical History:  Diagnosis Date  . Aortic atherosclerosis ([place]) 03/08/2017   CT  . Aortic stenosis    severe AS s/p TAVR (03/22/17) with a 66m Edwards Sapien 3 THV via the TF approach  . Arthritis    "pain in my knees" (03/30/2016)  . Bilateral cataracts   . CAD S/P percutaneous coronary angioplasty 03/31/2016   a. CP/USA -> LHC 03/31/16: 90% prox-mid RCA s/p DES, 40% prox-mid LAD, 25% D2, 25% prox-mid Cx, elevated LVEDP 33.  . Chronic diastolic CHF (congestive heart failure) ([place] 03/31/2016   pt unaware, has family history  . Chronic lower back pain   . DDD (degenerative disc disease), lumbar   . Dyslipidemia   . Grade II diastolic dysfunction   . Hearing loss   . Heart murmur   . Heartburn    occ. takes TUMS  . Humeral fracture 07/2014   Right  . Kidney cysts    1.6 cm simple cyst right  . LVH (left ventricular hypertrophy)    a. severe basal septal hypertrophy by echo 03/2016.  . Mitral regurgitation    a. mild by echo 03/2016.  . Osteopenia 03/13/2009  . Psoriasis   . Rheumatic fever    as a child  . Scoliosis 03/13/2009  . Seizure ([place] 01/[place]; 07/2011; 07/2015   "idiopathic; been on Kepra since 07/2011", last seizure poss. 07/2015, absent seizure  . Sinus bradycardia - baseline HR 50s at times.   . 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 Thrombosed hemorrhoids     Past Surgical History:   Procedure Laterality Date  . APPENDECTOMY    . BACK SURGERY    . CARDIAC CATHETERIZATION N/A 03/30/2016   Procedure: Right/Left Heart Cath and Coronary Angiography;  Surgeon: JJettie Booze MD;  [facility];  Service: Cardiovascular;  Laterality: N/A;  . CARDIAC CATHETERIZATION N/A 03/30/2016   Procedure: Coronary Stent Intervention;  Surgeon: JJettie Booze MD;  [facility];  Service: Cardiovascular;  Laterality: N/A;  . CATARACT EXTRACTION, BILATERAL    . COLONOSCOPY    . CORONARY ANGIOPLASTY    . FOREARM FRACTURE SURGERY Right ~ 2003-2004   "got steel plate in there"  . HERNIA REPAIR  2009  . I&D hemorrhoirds    . KNEE ARTHROSCOPY Bilateral   . MOHS SURGERY Left    temple  . ORIF SCAPULAR FRACTURE Right    right  . POSTERIOR LUMBAR FUSION  2004  . TEE WITHOUT CARDIOVERSION N/A 11/01/2016   Procedure: TRANSESOPHAGEAL ECHOCARDIOGRAM (TEE);  Surgeon: MSanda Klein MD;  [facility]  Service: Cardiovascular;  Laterality: N/A;  . TEE WITHOUT CARDIOVERSION N/A 03/22/2017   Procedure: TRANSESOPHAGEAL ECHOCARDIOGRAM (TEE);  Surgeon: CSherren Mocha MD;  [facility]  Service: Open Heart  Surgery;  Laterality: N/A;  . TONSILLECTOMY    . TOTAL KNEE ARTHROPLASTY Right 07/24/2018   Procedure: RIGHT TOTAL KNEE ARTHROPLASTY;  Surgeon: Gaynelle Arabian, MD;  [facility];  Service: Orthopedics;  Laterality: Right;  69mn  . TOTAL KNEE ARTHROPLASTY Left 06/11/2019   Procedure: TOTAL KNEE ARTHROPLASTY;  Surgeon: AGaynelle Arabian MD;  [facility];  Service: Orthopedics;  Laterality: Left;  531m  . TRANSCATHETER AORTIC VALVE REPLACEMENT, TRANSFEMORAL N/A 03/22/2017   Procedure: TRANSCATHETER AORTIC VALVE REPLACEMENT, TRANSFEMORAL;  Surgeon: CoSherren MochaMD;  [facility] Service: Open Heart Surgery;  Laterality: N/A;    There were no vitals filed for this visit.  Subjective Assessment - 07/30/19 1101    Subjective  "doing  fine". pt states he is still having trouble going down the stairs.    Currently in Pain?  Yes    Pain Score  2     Pain Location  Knee    Pain Orientation  Left                       [facility] Adult PT Treatment/Exercise - 07/30/19 0001      Ambulation/Gait   Gait Comments  2 flights of stairs, step over step cues for good eccentric control      Knee/Hip Exercises: Aerobic   Elliptical  R=5 I=10 x 2 minutes fwd/ 2 min back    Recumbent Bike  L 4 5 min       Knee/Hip Exercises: Machines for Strengthening   Cybex Knee Extension  15# both, 10# LLE only 2 x 10    Cybex Knee Flexion  35# 2x10 both, 25# LLE 2 x 10    Cybex Leg Press  --      Knee/Hip Exercises: Standing   Step Down  Left;1 set;10 reps;Hand Hold: 1;Step Height: 6"      Vasopneumatic   Number Minutes Vasopneumatic   15 minutes    Vasopnuematic Location   Knee    Vasopneumatic Pressure  Medium    Vasopneumatic Temperature   40      Manual Therapy   Manual Therapy  Passive ROM    Passive ROM  flexion and extension               PT Short Term Goals - 06/22/19 1101      PT SHORT TERM GOAL #1   Title  independent with intiial HEP    Status  Achieved        PT Long Term Goals - 07/16/19 1514      PT LONG TERM GOAL #2   Title  decrease pain 50%    Status  Achieved      PT LONG TERM GOAL #3   Title  increase AROM to 3-115 degrees flexion    Status  Partially Met      PT LONG TERM GOAL #4   Title  increase strength to 4+/5    Status  Achieved      PT LONG TERM GOAL #5   Title  decrease swelling 2 cm    Status  Achieved            Plan - 07/30/19 1139    Clinical Impression Statement  pt still has eccentric quad weakness when going down the stairs. pt worked on step down to stPhotographerpt needs cues with step downs to improve form and technique of exercise. pt able to increase time on  elliptical. pt needs rest breaks between exercises secondary to fatigue.  pt increased weight with knee extension on machine.    Stability/Clinical Decision Making  Evolving/Moderate complexity    Rehab Potential  Good    PT Frequency  3x / week    PT Duration  8 weeks    PT Treatment/Interventions  ADLs/Self Care Home Management;Electrical Stimulation;Gait training;Stair training;Functional mobility training;Therapeutic activities;Therapeutic exercise;Patient/family education;Neuromuscular re-education;Balance training;Manual techniques;Vasopneumatic Device;Passive range of motion    PT Next Visit Plan  strengthen quad, increase ROM    Consulted and Agree with Plan of Care  Patient       Patient will benefit from skilled therapeutic intervention in order to improve the following deficits and impairments:  Abnormal gait, Pain, Decreased scar mobility, Cardiopulmonary status limiting activity, Decreased activity tolerance, Decreased range of motion, Decreased strength, Decreased balance, Difficulty walking, Increased edema, Impaired flexibility  Visit Diagnosis: Stiffness of left knee, not elsewhere classified     Problem List Patient Active Problem List   Diagnosis Date Noted  . OA (osteoarthritis) of knee 07/24/2018  . Chronic diastolic heart failure ([place]) 11/13/2017  . Benign essential HTN 11/13/2017  . S/P TAVR (transcatheter aortic valve replacement) 03/22/2017  . Severe aortic stenosis 03/22/2017  . Bradycardia, drug induced 10/28/2016  . Dyslipidemia 03/31/2016  . Mitral regurgitation 03/31/2016  . CAD S/P percutaneous coronary angioplasty 03/31/2016  . Abnormal nuclear stress test   . Heart murmur 03/06/2015  . Abnormal EKG 03/06/2015  . LVH (left ventricular hypertrophy) 03/06/2015  . Rheumatic fever   . Localization-related idiopathic epilepsy and epileptic syndromes with seizures of localized onset, not intractable, without status epilepticus ([place]) 08/23/2014  . Hemorrhoid, thrombosed. 12/24/2011   Barrett Henle, SPTA Scot Jun 07/30/2019, 11:44 AM  [address] [place] [address] [place], [place], [address] Phone: [phone]   Fax:  [phone]  Name: JEHIEL KOEPP MRN: [id number] Date of Birth: 11-21-1945

## 2019-08-01 ENCOUNTER — Other Ambulatory Visit: Payer: Self-pay

## 2019-08-01 ENCOUNTER — Ambulatory Visit: Payer: Medicare Other | Admitting: Physical Therapy

## 2019-08-01 DIAGNOSIS — M25662 Stiffness of left knee, not elsewhere classified: Secondary | ICD-10-CM | POA: Diagnosis not present

## 2019-08-01 NOTE — Therapy (Addendum)
[address] [place], [place], [address] Phone: [phone]   Fax:  [phone]  Physical Therapy Treatment  Patient Details  Name: Douglas Lane MRN: [id number] Date of Birth: 11/18/1945 Referring Provider (PT): Aluisio   Encounter Date: 08/01/2019  PT End of Session - 08/01/19 1140    Visit Number  16    Date for PT Re-Evaluation  08/18/19    Authorization Type  [employer]HC-MC    PT Start Time  1100    PT Stop Time  1155    PT Time Calculation (min)  55 min       Past Medical History:  Diagnosis Date  . Aortic atherosclerosis ([place]) 03/08/2017   CT  . Aortic stenosis    severe AS s/p TAVR (03/22/17) with a 45m Edwards Sapien 3 THV via the TF approach  . Arthritis    "pain in my knees" (03/30/2016)  . Bilateral cataracts   . CAD S/P percutaneous coronary angioplasty 03/31/2016   a. CP/USA -> LHC 03/31/16: 90% prox-mid RCA s/p DES, 40% prox-mid LAD, 25% D2, 25% prox-mid Cx, elevated LVEDP 33.  . Chronic diastolic CHF (congestive heart failure) ([place] 03/31/2016   pt unaware, has family history  . Chronic lower back pain   . DDD (degenerative disc disease), lumbar   . Dyslipidemia   . Grade II diastolic dysfunction   . Hearing loss   . Heart murmur   . Heartburn    occ. takes TUMS  . Humeral fracture 07/2014   Right  . Kidney cysts    1.6 cm simple cyst right  . LVH (left ventricular hypertrophy)    a. severe basal septal hypertrophy by echo 03/2016.  . Mitral regurgitation    a. mild by echo 03/2016.  . Osteopenia 03/13/2009  . Psoriasis   . Rheumatic fever    as a child  . Scoliosis 03/13/2009  . Seizure ([place] 01/2011; 07/2011; 07/2015   "idiopathic; been on Kepra since 07/2011", last seizure poss. 07/2015, absent seizure  . Sinus bradycardia - baseline HR 50s at times.   . 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 Thrombosed hemorrhoids     Past Surgical History:   Procedure Laterality Date  . APPENDECTOMY    . BACK SURGERY    . CARDIAC CATHETERIZATION N/A 03/30/2016   Procedure: Right/Left Heart Cath and Coronary Angiography;  Surgeon: JJettie Booze MD;  [facility];  Service: Cardiovascular;  Laterality: N/A;  . CARDIAC CATHETERIZATION N/A 03/30/2016   Procedure: Coronary Stent Intervention;  Surgeon: JJettie Booze MD;  [facility];  Service: Cardiovascular;  Laterality: N/A;  . CATARACT EXTRACTION, BILATERAL    . COLONOSCOPY    . CORONARY ANGIOPLASTY    . FOREARM FRACTURE SURGERY Right ~ 2003-2004   "got steel plate in there"  . HERNIA REPAIR  2009  . I&D hemorrhoirds    . KNEE ARTHROSCOPY Bilateral   . MOHS SURGERY Left    temple  . ORIF SCAPULAR FRACTURE Right    right  . POSTERIOR LUMBAR FUSION  2004  . TEE WITHOUT CARDIOVERSION N/A 11/01/2016   Procedure: TRANSESOPHAGEAL ECHOCARDIOGRAM (TEE);  Surgeon: MSanda Klein MD;  [facility]  Service: Cardiovascular;  Laterality: N/A;  . TEE WITHOUT CARDIOVERSION N/A 03/22/2017   Procedure: TRANSESOPHAGEAL ECHOCARDIOGRAM (TEE);  Surgeon: CSherren Mocha MD;  Location: [facility]  Service: Open Heart  Surgery;  Laterality: N/A;  . TONSILLECTOMY    . TOTAL KNEE ARTHROPLASTY Right 07/24/2018   Procedure: RIGHT TOTAL KNEE ARTHROPLASTY;  Surgeon: Gaynelle Arabian, MD;  [facility];  Service: Orthopedics;  Laterality: Right;  32mn  . TOTAL KNEE ARTHROPLASTY Left 06/11/2019   Procedure: TOTAL KNEE ARTHROPLASTY;  Surgeon: AGaynelle Arabian MD;  [facility];  Service: Orthopedics;  Laterality: Left;  59m  . TRANSCATHETER AORTIC VALVE REPLACEMENT, TRANSFEMORAL N/A 03/22/2017   Procedure: TRANSCATHETER AORTIC VALVE REPLACEMENT, TRANSFEMORAL;  Surgeon: CoSherren MochaMD;  [facility] Service: Open Heart Surgery;  Laterality: N/A;    There were no vitals filed for this visit.  Subjective: "feeling okay" Pain: no/denies pain    [facility] PT  Assessment - 08/01/19 0001      AROM   Left Knee Extension  1    Left Knee Flexion  114                   [facility] Adult PT Treatment/Exercise - 08/01/19 0001      Knee/Hip Exercises: Aerobic   Recumbent Bike  --    Nustep  level 6 x 6 minutes no arms      Knee/Hip Exercises: Machines for Strengthening   Cybex Knee Extension  20# both, 15# LLE only 2 x 10    Cybex Knee Flexion  45# 2x10 both, 25# LLE 2 x 10    Cybex Leg Press  50# 2 sets 10, 30# LLE  2 x 10      Knee/Hip Exercises: Standing   Walking with Sports Cord  40# all directions x3      Vasopneumatic   Number Minutes Vasopneumatic   15 minutes    Vasopnuematic Location   Knee    Vasopneumatic Pressure  Medium    Vasopneumatic Temperature   40      Manual Therapy   Manual Therapy  Passive ROM    Passive ROM  flexion and extension               PT Short Term Goals - 06/22/19 1101      PT SHORT TERM GOAL #1   Title  independent with intiial HEP    Status  Achieved        PT Long Term Goals - 08/01/19 1138      PT LONG TERM GOAL #3   Title  increase AROM to 3-115 degrees flexion    Status  Partially Met            Plan - 08/01/19 1141    Clinical Impression Statement  pt tolerated treatment well as demostrated by no increase of pain with interventions. pt is making progress toward ROM goal and has met all other goals. pt able to increase weight with leg press and knee extension. pt needs rest breaks between exercises secondary to fatigue. pt able to maintain balance with reisited gait with CGA.    Stability/Clinical Decision Making  Evolving/Moderate complexity    Rehab Potential  Good    PT Frequency  3x / week    PT Duration  8 weeks    PT Treatment/Interventions  ADLs/Self Care Home Management;Electrical Stimulation;Gait training;Stair training;Functional mobility training;Therapeutic activities;Therapeutic exercise;Patient/family education;Neuromuscular re-education;Balance  training;Manual techniques;Vasopneumatic Device;Passive range of motion    PT Next Visit Plan  strengthen quad, increase ROM    Consulted and Agree with Plan of Care  Patient       Patient will benefit from skilled therapeutic intervention  in order to improve the following deficits and impairments:  Abnormal gait, Pain, Decreased scar mobility, Cardiopulmonary status limiting activity, Decreased activity tolerance, Decreased range of motion, Decreased strength, Decreased balance, Difficulty walking, Increased edema, Impaired flexibility  Visit Diagnosis: Stiffness of left knee, not elsewhere classified     Problem List Patient Active Problem List   Diagnosis Date Noted  . OA (osteoarthritis) of knee 07/24/2018  . Chronic diastolic heart failure ([place]) 11/13/2017  . Benign essential HTN 11/13/2017  . S/P TAVR (transcatheter aortic valve replacement) 03/22/2017  . Severe aortic stenosis 03/22/2017  . Bradycardia, drug induced 10/28/2016  . Dyslipidemia 03/31/2016  . Mitral regurgitation 03/31/2016  . CAD S/P percutaneous coronary angioplasty 03/31/2016  . Abnormal nuclear stress test   . Heart murmur 03/06/2015  . Abnormal EKG 03/06/2015  . LVH (left ventricular hypertrophy) 03/06/2015  . Rheumatic fever   . Localization-related idiopathic epilepsy and epileptic syndromes with seizures of localized onset, not intractable, without status epilepticus ([place]) 08/23/2014  . Hemorrhoid, thrombosed. 12/24/2011    Barrett Henle, [place] 08/01/2019, 11:46 AM  [address] [place], [place], [address] Phone: [phone]   Fax:  [phone]  Name: DAY DEERY MRN: [id number] Date of Birth: 04/02/1946

## 2019-08-06 ENCOUNTER — Encounter: Payer: Self-pay | Admitting: Physical Therapy

## 2019-08-06 ENCOUNTER — Ambulatory Visit: Payer: Medicare Other | Admitting: Physical Therapy

## 2019-08-06 ENCOUNTER — Other Ambulatory Visit: Payer: Self-pay

## 2019-08-06 DIAGNOSIS — M25662 Stiffness of left knee, not elsewhere classified: Secondary | ICD-10-CM | POA: Diagnosis not present

## 2019-08-06 DIAGNOSIS — R262 Difficulty in walking, not elsewhere classified: Secondary | ICD-10-CM

## 2019-08-06 DIAGNOSIS — M25562 Pain in left knee: Secondary | ICD-10-CM

## 2019-08-06 NOTE — Therapy (Signed)
[address] [place], [place], [address] Phone: [phone]   Fax:  [phone]  Physical Therapy Treatment  Patient Details  Name: Douglas Lane MRN: [id number] Date of Birth: [birth year] Referring Provider (PT): Aluisio   Encounter Date: 08/06/2019  PT End of Session - 08/06/19 1140    Visit Number  17    Date for PT Re-Evaluation  08/18/19    Authorization Type  [employer]HC-MC    PT Start Time  1057    PT Stop Time  1142    PT Time Calculation (min)  45 min    Activity Tolerance  Patient tolerated treatment well       Past Medical History:  Diagnosis Date  . Aortic atherosclerosis ([place]) 03/08/2017   CT  . Aortic stenosis    severe AS s/p TAVR (03/22/17) with a 2m Edwards Sapien 3 THV via the TF approach  . Arthritis    "pain in my knees" (03/30/2016)  . Bilateral cataracts   . CAD S/P percutaneous coronary angioplasty 03/31/2016   a. CP/USA -> LHC 03/31/16: 90% prox-mid RCA s/p DES, 40% prox-mid LAD, 25% D2, 25% prox-mid Cx, elevated LVEDP 33.  . Chronic diastolic CHF (congestive heart failure) ([place] 03/31/2016   pt unaware, has family history  . Chronic lower back pain   . DDD (degenerative disc disease), lumbar   . Dyslipidemia   . Grade II diastolic dysfunction   . Hearing loss   . Heart murmur   . Heartburn    occ. takes TUMS  . Humeral fracture 07/2014   Right  . Kidney cysts    1.6 cm simple cyst right  . LVH (left ventricular hypertrophy)    a. severe basal septal hypertrophy by echo 03/2016.  . Mitral regurgitation    a. mild by echo 03/2016.  . Osteopenia 03/13/2009  . Psoriasis   . Rheumatic fever    as a child  . Scoliosis 03/13/2009  . Seizure ([place] 01/[place]; 07/2011; 07/2015   "idiopathic; been on Kepra since 07/2011", last seizure poss. 07/2015, absent seizure  . Sinus bradycardia - baseline HR 50s at times.   . 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  Thrombosed hemorrhoids     Past Surgical History:  Procedure Laterality Date  . APPENDECTOMY    . BACK SURGERY    . CARDIAC CATHETERIZATION N/A 03/30/2016   Procedure: Right/Left Heart Cath and Coronary Angiography;  Surgeon: JJettie Booze MD;  [facility];  Service: Cardiovascular;  Laterality: N/A;  . CARDIAC CATHETERIZATION N/A 03/30/2016   Procedure: Coronary Stent Intervention;  Surgeon: JJettie Booze MD;  [facility];  Service: Cardiovascular;  Laterality: N/A;  . CATARACT EXTRACTION, BILATERAL    . COLONOSCOPY    . CORONARY ANGIOPLASTY    . FOREARM FRACTURE SURGERY Right ~ 2003-2004   "got steel plate in there"  . HERNIA REPAIR  2009  . I&D hemorrhoirds    . KNEE ARTHROSCOPY Bilateral   . MOHS SURGERY Left    temple  . ORIF SCAPULAR FRACTURE Right    right  . POSTERIOR LUMBAR FUSION  2004  . TEE WITHOUT CARDIOVERSION N/A 11/01/2016   Procedure: TRANSESOPHAGEAL ECHOCARDIOGRAM (TEE);  Surgeon: MSanda Klein MD;  [facility]  Service: Cardiovascular;  Laterality: N/A;  . TEE WITHOUT CARDIOVERSION N/A 03/22/2017   Procedure: TRANSESOPHAGEAL ECHOCARDIOGRAM (TEE);  Surgeon: CBurt Knack  Legrand Como, MD;  Location: [place];  Service: Open Heart Surgery;  Laterality: N/A;  . TONSILLECTOMY    . TOTAL KNEE ARTHROPLASTY Right 07/24/2018   Procedure: RIGHT TOTAL KNEE ARTHROPLASTY;  Surgeon: Gaynelle Arabian, MD;  [facility];  Service: Orthopedics;  Laterality: Right;  48mn  . TOTAL KNEE ARTHROPLASTY Left 06/11/2019   Procedure: TOTAL KNEE ARTHROPLASTY;  Surgeon: AGaynelle Arabian MD;  [facility];  Service: Orthopedics;  Laterality: Left;  538m  . TRANSCATHETER AORTIC VALVE REPLACEMENT, TRANSFEMORAL N/A 03/22/2017   Procedure: TRANSCATHETER AORTIC VALVE REPLACEMENT, TRANSFEMORAL;  Surgeon: CoSherren MochaMD;  [facility] Service: Open Heart Surgery;  Laterality: N/A;    There were no vitals filed for this visit.  Subjective  Assessment - 08/06/19 1057    Subjective  "Doing fine"    Currently in Pain?  No/denies                       [facility]Ex[facility] - 08/06/19 0001      Ambulation/Gait   Gait Comments  2 flights of stairs, step over step cues for good eccentric control, Some R knee pain descending stairs      Knee/Hip Exercises: Aerobic   Elliptical  I11 R 7 x 2 min each     Recumbent Bike  L2 x 4 min       Knee/Hip Exercises: Machines for Strengthening   Cybex Knee Extension  20# both, 20# LLE only x10 then 10lb x15     Cybex Knee Flexion  45# 2x15 both, 25# LLE 2 x 10    Cybex Leg Press  60# 2 sets 10, 40# LLE  2 x 10      Knee/Hip Exercises: Standing   Heel Raises  Both;15 reps;2 sets   black bar    Lateral Step Up  Left;2 sets;10 reps;Hand Hold: 0;Step Height: 8"    Forward Step Up  Both;2 sets;10 reps;Hand Hold: 0;Step Height: 8"    Walking with Sports Cord  30lb with sinch step up forward only x10      Knee/Hip Exercises: Seated   Sit to Sand  2 sets;10 reps;without UE support   holding blue ball              PT Short Term Goals - 06/22/19 1101      PT SHORT TERM GOAL #1   Title  independent with intiial HEP    Status  Achieved        PT Long Term Goals - 08/01/19 1138      PT LONG TERM GOAL #3   Title  increase AROM to 3-115 degrees flexion    Status  Partially Met            Plan - 08/06/19 1141    Clinical Impression Statement  Pt did really well with today's treatment He reports some L knee pain descending stairs during the time his L knee is flexed the most. Increase weight and or reps tolerated with all machine level interventions. Good stability with resisted step ups.    Stability/Clinical Decision Making  Evolving/Moderate complexity    PT Frequency  3x / week    PT Duration  8 weeks    PT Treatment/Interventions  ADLs/Self Care Home Management;Electrical Stimulation;Gait training;Stair training;Functional mobility  training;Therapeutic activities;Therapeutic exercise;Patient/family education;Neuromuscular re-education;Balance training;Manual techniques;Vasopneumatic Device;Passive range of motion    PT Next Visit Plan  strengthen quad, increase ROM  Patient will benefit from skilled therapeutic intervention in order to improve the following deficits and impairments:  Abnormal gait, Pain, Decreased scar mobility, Cardiopulmonary status limiting activity, Decreased activity tolerance, Decreased range of motion, Decreased strength, Decreased balance, Difficulty walking, Increased edema, Impaired flexibility  Visit Diagnosis: Stiffness of left knee, not elsewhere classified  Difficulty in walking, not elsewhere classified  Acute pain of left knee     Problem List Patient Active Problem List   Diagnosis Date Noted  . OA (osteoarthritis) of knee 07/24/2018  . Chronic diastolic heart failure ([place]) 11/13/2017  . Benign essential HTN 11/13/2017  . S/P TAVR (transcatheter aortic valve replacement) 03/22/2017  . Severe aortic stenosis 03/22/2017  . Bradycardia, drug induced 10/28/2016  . Dyslipidemia 03/31/2016  . Mitral regurgitation 03/31/2016  . CAD S/P percutaneous coronary angioplasty 03/31/2016  . Abnormal nuclear stress test   . Heart murmur 03/06/2015  . Abnormal EKG 03/06/2015  . LVH (left ventricular hypertrophy) 03/06/2015  . Rheumatic fever   . Localization-related idiopathic epilepsy and epileptic syndromes with seizures of localized onset, not intractable, without status epilepticus ([place]) 08/23/2014  . Hemorrhoid, thrombosed. 12/24/2011    Scot Jun , PTA 08/06/2019, 11:44 AM  [address] [place] [place], [place], [address] Phone: [phone]   Fax:  [phone]  Name: BARUCH LEWERS MRN: [id number] Date of Birth: 01-09-1946

## 2019-08-08 ENCOUNTER — Other Ambulatory Visit: Payer: Self-pay

## 2019-08-08 ENCOUNTER — Encounter: Payer: Self-pay | Admitting: Physical Therapy

## 2019-08-08 ENCOUNTER — Ambulatory Visit: Payer: Medicare Other | Admitting: Physical Therapy

## 2019-08-08 DIAGNOSIS — R262 Difficulty in walking, not elsewhere classified: Secondary | ICD-10-CM

## 2019-08-08 DIAGNOSIS — M25662 Stiffness of left knee, not elsewhere classified: Secondary | ICD-10-CM

## 2019-08-08 DIAGNOSIS — M25562 Pain in left knee: Secondary | ICD-10-CM

## 2019-08-08 NOTE — Therapy (Signed)
[address] [place], [place], [address] Phone: [phone]   Fax:  [phone]  Physical Therapy Treatment  Patient Details  Name: Douglas Lane MRN: [id number] Date of Birth: 1946/01/31 Referring Provider (PT): Aluisio   Encounter Date: 08/08/2019  PT End of Session - 08/08/19 1139    Visit Number  18    Date for PT Re-Evaluation  08/18/19    Authorization Type  [employer]    PT Start Time  1100    PT Stop Time  1140    PT Time Calculation (min)  40 min    Activity Tolerance  Patient tolerated treatment well    Behavior During Therapy  [facility] for tasks assessed/performed       Past Medical History:  Diagnosis Date  . Aortic atherosclerosis ([place]) 03/08/2017   CT  . Aortic stenosis    severe AS s/p TAVR (03/22/17) with a 91m Edwards Sapien 3 THV via the TF approach  . Arthritis    "pain in my knees" (03/30/2016)  . Bilateral cataracts   . CAD S/P percutaneous coronary angioplasty 03/31/2016   a. CP/USA -> LHC 03/31/16: 90% prox-mid RCA s/p DES, 40% prox-mid LAD, 25% D2, 25% prox-mid Cx, elevated LVEDP 33.  . Chronic diastolic CHF (congestive heart failure) ([place] 03/31/2016   pt unaware, has family history  . Chronic lower back pain   . DDD (degenerative disc disease), lumbar   . Dyslipidemia   . Grade II diastolic dysfunction   . Hearing loss   . Heart murmur   . Heartburn    occ. takes TUMS  . Humeral fracture 07/2014   Right  . Kidney cysts    1.6 cm simple cyst right  . LVH (left ventricular hypertrophy)    a. severe basal septal hypertrophy by echo 03/2016.  . Mitral regurgitation    a. mild by echo 03/2016.  . Osteopenia 03/13/2009  . Psoriasis   . Rheumatic fever    as a child  . Scoliosis 03/13/2009  . Seizure ([place] 01/2011; 07/2011; 07/2015   "idiopathic; been on Kepra since 07/2011", last seizure poss. 07/2015, absent seizure  . Sinus bradycardia - baseline HR 50s at times.   .  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 Thrombosed hemorrhoids     Past Surgical History:  Procedure Laterality Date  . APPENDECTOMY    . BACK SURGERY    . CARDIAC CATHETERIZATION N/A 03/30/2016   Procedure: Right/Left Heart Cath and Coronary Angiography;  Surgeon: JJettie Booze MD;  [facility];  Service: Cardiovascular;  Laterality: N/A;  . CARDIAC CATHETERIZATION N/A 03/30/2016   Procedure: Coronary Stent Intervention;  Surgeon: JJettie Booze MD;  Location[facility];  Service: Cardiovascular;  Laterality: N/A;  . CATARACT EXTRACTION, BILATERAL    . COLONOSCOPY    . CORONARY ANGIOPLASTY    . FOREARM FRACTURE SURGERY Right ~ 2003-2004   "got steel plate in there"  . HERNIA REPAIR  2009  . I&D hemorrhoirds    . KNEE ARTHROSCOPY Bilateral   . MOHS SURGERY Left    temple  . ORIF SCAPULAR FRACTURE Right    right  . POSTERIOR LUMBAR FUSION  2004  . TEE WITHOUT CARDIOVERSION N/A 11/01/2016   Procedure: TRANSESOPHAGEAL ECHOCARDIOGRAM (TEE);  Surgeon: MSanda Klein MD;  [facility];  Service: Cardiovascular;  Laterality: N/A;  . TEE WITHOUT CARDIOVERSION  N/A 03/22/2017   Procedure: TRANSESOPHAGEAL ECHOCARDIOGRAM (TEE);  Surgeon: Sherren Mocha, MD;  Location: [place];  Service: Open Heart Surgery;  Laterality: N/A;  . TONSILLECTOMY    . TOTAL KNEE ARTHROPLASTY Right 07/24/2018   Procedure: RIGHT TOTAL KNEE ARTHROPLASTY;  Surgeon: Gaynelle Arabian, MD;  Location: [facility];  Service: Orthopedics;  Laterality: Right;  11mn  . TOTAL KNEE ARTHROPLASTY Left 06/11/2019   Procedure: TOTAL KNEE ARTHROPLASTY;  Surgeon: AGaynelle Arabian MD;  Location: [facility];  Service: Orthopedics;  Laterality: Left;  558m  . TRANSCATHETER AORTIC VALVE REPLACEMENT, TRANSFEMORAL N/A 03/22/2017   Procedure: TRANSCATHETER AORTIC VALVE REPLACEMENT, TRANSFEMORAL;  Surgeon: CoSherren MochaMD;  [facility] Service: Open Heart Surgery;  Laterality:  N/A;    There were no vitals filed for this visit.  Subjective Assessment - 08/08/19 1059    Subjective  Had some pain and difficulty flexion yesterday    Currently in Pain?  Yes    Pain Score  4          [facility] PT Assessment - 08/08/19 0001      AROM   Left Knee Extension  1    Left Knee Flexion  110      Strength   Overall Strength Comments  R knee 4+/5                   [facility] Adult PT Treatment/Exercise - 08/08/19 0001      Knee/Hip Exercises: Aerobic   Elliptical  I11 R 5 x 3 min each     Recumbent Bike  L2 x 4352m      Knee/Hip Exercises: Machines for Strengthening   Cybex Knee Extension  20# 2 x 15, LLE 10lb 2x10      Cybex Knee Flexion  45# 2x15 both, 25# LLE 2 x 10    Cybex Leg Press  50# 2 sets 10, 30# LLE  2 x 10      Knee/Hip Exercises: Standing   Heel Raises  Both;15 reps;2 sets   black bar    Forward Step Up  2 sets;10 reps;Hand Hold: 0;Step Height: 8";Both      Knee/Hip Exercises: Seated   Sit to Sand  2 sets;10 reps;without UE support               PT Short Term Goals - 06/22/19 1101      PT SHORT TERM GOAL #1   Title  independent with intiial HEP    Status  Achieved        PT Long Term Goals - 08/08/19 1134      PT LONG TERM GOAL #1   Title  ambulate community distances without device or with SPC    Status  Achieved      PT LONG TERM GOAL #2   Title  decrease pain 50%    Status  Achieved      PT LONG TERM GOAL #3   Title  increase AROM to 3-115 degrees flexion    Status  Partially Met      PT LONG TERM GOAL #4   Title  increase strength to 4+/5    Status  Achieved      PT LONG TERM GOAL #5   Title  decrease swelling 2 cm    Status  Achieved            Plan - 08/08/19 1140    Clinical Impression Statement  All goals met. Pt has good L  knee ROM  as well as strength. No reports of pain today. Pt reports no functional limitations at home. He expressed that he will try to workout at the "[employer]"     Stability/Clinical Decision Making  Evolving/Moderate complexity    Rehab Potential  Good    PT Frequency  3x / week    PT Duration  8 weeks    PT Treatment/Interventions  ADLs/Self Care Home Management;Electrical Stimulation;Gait training;Stair training;Functional mobility training;Therapeutic activities;Therapeutic exercise;Patient/family education;Neuromuscular re-education;Balance training;Manual techniques;Vasopneumatic Device;Passive range of motion    PT Next Visit Plan  D/c PT       Patient will benefit from skilled therapeutic intervention in order to improve the following deficits and impairments:  Abnormal gait, Pain, Decreased scar mobility, Cardiopulmonary status limiting activity, Decreased activity tolerance, Decreased range of motion, Decreased strength, Decreased balance, Difficulty walking, Increased edema, Impaired flexibility  Visit Diagnosis: Difficulty in walking, not elsewhere classified  Acute pain of left knee  Stiffness of left knee, not elsewhere classified     Problem List Patient Active Problem List   Diagnosis Date Noted  . OA (osteoarthritis) of knee 07/24/2018  . Chronic diastolic heart failure ([place]) 11/13/2017  . Benign essential HTN 11/13/2017  . S/P TAVR (transcatheter aortic valve replacement) 03/22/2017  . Severe aortic stenosis 03/22/2017  . Bradycardia, drug induced 10/28/2016  . Dyslipidemia 03/31/2016  . Mitral regurgitation 03/31/2016  . CAD S/P percutaneous coronary angioplasty 03/31/2016  . Abnormal nuclear stress test   . Heart murmur 03/06/2015  . Abnormal EKG 03/06/2015  . LVH (left ventricular hypertrophy) 03/06/2015  . Rheumatic fever   . Localization-related idiopathic epilepsy and epileptic syndromes with seizures of localized onset, not intractable, without status epilepticus ([place]) 08/23/2014  . Hemorrhoid, thrombosed. 12/24/2011   PHYSICAL THERAPY DISCHARGE SUMMARY  Visits from Start of Care: 18  Plan: Patient agrees  to discharge.  Patient goals were met. Patient is being discharged due to meeting the stated rehab goals.  ?????     Scot Jun, PTA 08/08/2019, 11:42 AM  [address] [place], [place], [address] Phone: [phone]   Fax:  [phone]  Name: KYLOR VALVERDE MRN: [id number] Date of Birth: [birth year]

## 2019-08-30 ENCOUNTER — Other Ambulatory Visit: Payer: Self-pay | Admitting: Neurology

## 2019-08-30 MED ORDER — AMITRIPTYLINE HCL 25 MG PO TABS: 25.0000 mg | ORAL_TABLET | Freq: Every day | ORAL | 6 refills | Status: DC

## 2019-09-06 ENCOUNTER — Ambulatory Visit: Payer: Medicare PPO | Attending: Internal Medicine

## 2019-09-06 DIAGNOSIS — Z23 Encounter for immunization: Secondary | ICD-10-CM

## 2019-09-06 NOTE — Progress Notes (Signed)
   Covid-19 Vaccination Clinic  Name:  Douglas Lane    MRN: [id number] DOB: 07-07-1946  09/06/2019  [name] was observed post Covid-19 immunization for 15 minutes without incidence. He was provided with Vaccine Information Sheet and instruction to access the V-Safe system.   [name] was instructed to call 911 with any severe reactions post vaccine: Marland Kitchen Difficulty breathing  . Swelling of your face and throat  . A fast heartbeat  . A bad rash all over your body  . Dizziness and weakness    Immunizations Administered    Name Date Dose VIS Date Route   Pfizer COVID-19 Vaccine 09/06/2019  3:40 PM 0.3 mL 07/27/2019 Intramuscular   Manufacturer: [place]   Lot: [id number]   [place]: [id number]

## 2019-09-27 ENCOUNTER — Ambulatory Visit: Payer: Medicare PPO | Attending: Internal Medicine

## 2019-09-27 DIAGNOSIS — Z23 Encounter for immunization: Secondary | ICD-10-CM

## 2019-09-27 NOTE — Progress Notes (Signed)
   Covid-19 Vaccination Clinic  Name:  Douglas Lane    MRN: [id number] DOB: 1946-03-10  09/27/2019  [name] was observed post Covid-19 immunization for 15 minutes without incidence. He was provided with Vaccine Information Sheet and instruction to access the V-Safe system.   [name] was instructed to call 911 with any severe reactions post vaccine: Marland Kitchen Difficulty breathing  . Swelling of your face and throat  . A fast heartbeat  . A bad rash all over your body  . Dizziness and weakness    Immunizations Administered    Name Date Dose VIS Date Route   Pfizer COVID-19 Vaccine 09/27/2019  4:51 PM 0.3 mL 07/27/2019 Intramuscular   Manufacturer: [place]   Lot: [id number]   [place]: [id number]

## 2019-11-30 ENCOUNTER — Other Ambulatory Visit: Payer: Self-pay

## 2019-11-30 MED ORDER — ROSUVASTATIN CALCIUM 40 MG PO TABS: ORAL_TABLET | ORAL | 3 refills | Status: DC

## 2020-02-20 ENCOUNTER — Other Ambulatory Visit: Payer: Self-pay | Admitting: Family Medicine

## 2020-02-20 ENCOUNTER — Ambulatory Visit
Admission: RE | Admit: 2020-02-20 | Discharge: 2020-02-20 | Disposition: A | Discharge status: Home / Self Care | Payer: Medicare PPO | Source: Ambulatory Visit | Attending: Family Medicine | Admitting: Family Medicine

## 2020-02-20 DIAGNOSIS — M25572 Pain in left ankle and joints of left foot: Secondary | ICD-10-CM

## 2020-02-20 DIAGNOSIS — E7800 Pure hypercholesterolemia, unspecified: Secondary | ICD-10-CM | POA: Diagnosis not present

## 2020-02-20 DIAGNOSIS — M79672 Pain in left foot: Secondary | ICD-10-CM

## 2020-02-20 DIAGNOSIS — Z79899 Other long term (current) drug therapy: Secondary | ICD-10-CM | POA: Diagnosis not present

## 2020-02-20 DIAGNOSIS — R609 Edema, unspecified: Secondary | ICD-10-CM | POA: Diagnosis not present

## 2020-02-20 DIAGNOSIS — M7989 Other specified soft tissue disorders: Secondary | ICD-10-CM | POA: Diagnosis not present

## 2020-02-28 DIAGNOSIS — E7800 Pure hypercholesterolemia, unspecified: Secondary | ICD-10-CM | POA: Diagnosis not present

## 2020-02-28 DIAGNOSIS — Z0000 Encounter for general adult medical examination without abnormal findings: Secondary | ICD-10-CM | POA: Diagnosis not present

## 2020-02-28 DIAGNOSIS — Z79899 Other long term (current) drug therapy: Secondary | ICD-10-CM | POA: Diagnosis not present

## 2020-02-28 DIAGNOSIS — B351 Tinea unguium: Secondary | ICD-10-CM | POA: Diagnosis not present

## 2020-02-28 DIAGNOSIS — G4700 Insomnia, unspecified: Secondary | ICD-10-CM | POA: Diagnosis not present

## 2020-02-28 DIAGNOSIS — D72821 Monocytosis (symptomatic): Secondary | ICD-10-CM | POA: Diagnosis not present

## 2020-03-31 ENCOUNTER — Other Ambulatory Visit: Payer: Self-pay

## 2020-03-31 ENCOUNTER — Encounter: Payer: Self-pay | Admitting: Neurology

## 2020-03-31 ENCOUNTER — Ambulatory Visit: Payer: Medicare PPO | Admitting: Neurology

## 2020-03-31 DIAGNOSIS — G40009 Localization-related (focal) (partial) idiopathic epilepsy and epileptic syndromes with seizures of localized onset, not intractable, without status epilepticus: Secondary | ICD-10-CM | POA: Diagnosis not present

## 2020-03-31 MED ORDER — LEVETIRACETAM 750 MG PO TABS: 750.0000 mg | ORAL_TABLET | Freq: Two times a day (BID) | ORAL | 3 refills | Status: DC

## 2020-03-31 NOTE — Patient Instructions (Signed)
Good to see you. Continue Keppra 750mg twice a day. Follow-up in 1 year, call for any changes.   Seizure Precautions: 1. If medication has been prescribed for you to prevent seizures, take it exactly as directed.  Do not stop taking the medicine without talking to your doctor first, even if you have not had a seizure in a long time.   2. Avoid activities in which a seizure would cause danger to yourself or to others.  Don't operate dangerous machinery, swim alone, or climb in high or dangerous places, such as on ladders, roofs, or girders.  Do not drive unless your doctor says you may.  3. If you have any warning that you may have a seizure, lay down in a safe place where you can't hurt yourself.    4.  No driving for 6 months from last seizure, as per [place] state law.   Please refer to the following link on the Epilepsy Foundation of America's website for more information: http://www.epilepsyfoundation.org/answerplace/Social/driving/drivingu.cfm   5.  Maintain good sleep hygiene. Avoid alcohol.  6.  Contact your doctor if you have any problems that may be related to the medicine you are taking.  7.  Call 911 and bring the patient back to the ED if:        A.  The seizure lasts longer than 5 minutes.       B.  The patient doesn't awaken shortly after the seizure  C.  The patient has new problems such as difficulty seeing, speaking or moving  D.  The patient was injured during the seizure  E.  The patient has a temperature over 102 F (39C)  F.  The patient vomited and now is having trouble breathing        

## 2020-03-31 NOTE — Progress Notes (Signed)
NEUROLOGY FOLLOW UP OFFICE NOTE  BLUE WINTHER [id number] 1945-10-19  HISTORY OF PRESENT ILLNESS: I had the pleasure of seeing Jeniel Slauson in follow-up in the neurology clinic on 03/31/2020.  The patient was last seen a year ago for recurrent seizures. He remains seizure-free since 2019 on Levetiracetam 750mg  BID. He denies any staring/unresponsive episodes, gaps in time, olfactory/gustatory hallucinations, focal numbness/tingling/weakness, myoclonic jerks. He has recovered well from his knee surgery and denies any pain. He contacted our office in January to report poor sleep and had restarted amitriptyline, however he states that he stopped it because it was not helping at all. His PCP started him on prn clonazepam, he only takes it as needed and it seems to be the most effective. His last fall was in May when he stepped off his boat, around 4-6 weeks later, he started having pain on the dorsum of his left foot and wears an elastic brace.    History on Initial Assessment 08/20/2014: This is a 74 yo RH man with a history of 2 seizures in 2012 that occurred 6 months apart, presenting after ER visit for a seizure last 08/03/14. The first seizure occurred in June 2012. He recalls it was a stressful period just soon after he retired. He had a nocturnal convulsion and was brought [facility]. He was not started on medication initially as it was his first seizure. On December 2012, he had another nocturnal convulsion and was started on Keppra 500mg  BID with no further seizures for 3 years. He and his neurologist in [facility] agreed to start weaning off Keppra, and this was completely discontinued the spring/summer of 2015. Around 2-1/2 years ago, he had also been reporting anxiety to his neurologist, and was started on clorazepate 7.5mg , weaned to 1/2 tab for 6 months, then discontinued 3 days prior to the most recent nocturnal seizure on 12/16 off AEDs. He was brought to [facility] ER, there is no  bloodwork or drug screen for review, CT head unremarkable.He was found to have posterior dislocation and comminuted fracture of the right humeral head. He had soft tissue injury extending along the right anterior proximal arm. This was reduced in the ER and he continues to wear a sling. He was restarted on Keppra 500mg  BID with no side effects except for mild drowsiness.   His wife reports that since coming off the [place] last year, he has had 3 incidences of memory lapses, where he could not recall the last few hours. His wife reports that he would look confused but he can function and drive. The last episode occurred 6 weeks ago, he did not recall being in church. His wife reports he was talking fine but had a different look about him. This lasted about 1-2 hours. He had neuropsychological testing for memory loss and was told he has "short-term memory problems." In hindsight, when asked about an MRI done in January 2012, he had an episode where he had transient memory loss while he was at a [place]. I personally reviewed MRI brain without contrast done which showed mild chronic microvascular changes in the bilateral subcortical regions and right central pons.  He denies any episodes of staring/unresponsiveness, no olfactory/gustatory hallucinations, deja vu, rising epigastric sensation, focal numbness/tingling/weakness, myoclonic jerks. He has 1-4 drinks daily (beer or whisky), with no change in pattern for many years. He denies any sleep deprivation prior to the seizures, but reports these being stressful periods. He continues to have anxiety and has "  always been a worrier."   Epilepsy Risk Factors: He had a normal birth and early development. There is no history of febrile convulsions, CNS infections such as meningitis/encephalitis, significant traumatic brain injury, neurosurgical procedures, or family history of seizures.   PAST MEDICAL HISTORY: Past Medical History:  Diagnosis Date  . Aortic  atherosclerosis ([place]) 03/08/2017   CT  . Aortic stenosis    severe AS s/p TAVR (03/22/17) with a 38mm Edwards Sapien 3 THV via the TF approach  . Arthritis    "pain in my knees" (03/30/2016)  . Bilateral cataracts   . CAD S/P percutaneous coronary angioplasty 03/31/2016   a. CP/USA -> LHC 03/31/16: 90% prox-mid RCA s/p DES, 40% prox-mid LAD, 25% D2, 25% prox-mid Cx, elevated LVEDP 33.  . Chronic diastolic CHF (congestive heart failure) ([place]) 03/31/2016   pt unaware, has family history  . Chronic lower back pain   . DDD (degenerative disc disease), lumbar   . Dyslipidemia   . Grade II diastolic dysfunction   . Hearing loss   . Heart murmur   . Heartburn    occ. takes TUMS  . Humeral fracture 07/2014   Right  . Kidney cysts    1.6 cm simple cyst right  . LVH (left ventricular hypertrophy)    a. severe basal septal hypertrophy by echo 03/2016.  . Mitral regurgitation    a. mild by echo 03/2016.  . Osteopenia 03/13/2009  . Psoriasis   . Rheumatic fever    as a child  . Scoliosis 03/13/2009  . 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 07/2015, absent seizure  . Sinus bradycardia - baseline HR 50s at times.   . 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 Thrombosed hemorrhoids     MEDICATIONS: Current Outpatient Medications on File Prior to Visit  Medication Sig Dispense Refill  . aspirin EC 81 MG tablet Take 81 mg by mouth daily. Swallow whole.    . calcium carbonate (TUMS - DOSED IN MG ELEMENTAL CALCIUM) 500 MG chewable tablet Chew 1 tablet by mouth daily as needed for heartburn.     . clobetasol (TEMOVATE) 0.05 % external solution Apply 1 application topically 2 (two) times daily as needed (for psoriasis).    . clobetasol ointment (TEMOVATE) 4.81 % Apply 1 application topically 2 (two) times daily as needed (for psoriasis).    . clonazePAM (KLONOPIN) 0.5 MG tablet Take 0.5 mg by mouth 2 (two) times  daily as needed for anxiety.    Marland Kitchen desonide (DESOWEN) 0.05 % ointment Apply 1 application topically daily as needed (psoriasis).     . diphenhydrAMINE (BENADRYL) 25 mg capsule Take 25 mg by mouth daily as needed for allergies.    Marland Kitchen ezetimibe (ZETIA) 10 MG tablet TAKE 1 TABLET BY MOUTH DAILY 90 tablet 3  . HUMIRA PEN 40 MG/0.8ML PNKT Inject 40 mg into the skin See admin instructions. Inject 40 mg SQ every 6 weeks    . ibuprofen (ADVIL) 200 MG tablet Take 200 mg by mouth every 6 (six) hours as needed.    . levETIRAcetam (KEPPRA) 750 MG tablet Take 1 tablet (750 mg total) by mouth 2 (two) times daily. 180 tablet 3  . nitroGLYCERIN (NITROSTAT) 0.4 MG SL tablet DISSOLVE 1 TABLET UNDER THE TONGUE EVERY 5 MINUTES AS NEEDED FOR CHEST PAIN( UP TO 3 DOSES) 25 tablet 5  . PROCTOZONE-HC 2.5 % rectal cream  Apply 1 application topically daily as needed for hemorrhoids or itching.     . rosuvastatin (CRESTOR) 40 MG tablet TAKE 1 TABLET(40 MG) BY MOUTH DAILY 90 tablet 3  . shark liver oil-cocoa butter (PREPARATION H) 0.25-3-85.5 % suppository Place 1 suppository rectally as needed for hemorrhoids.    . terbinafine (LAMISIL) 250 MG tablet Take 250 mg by mouth See admin instructions. Takes 1  Week out of each month    . amitriptyline (ELAVIL) 25 MG tablet Take 1 tablet (25 mg total) by mouth at bedtime. (Patient not taking: Reported on 03/31/2020) 30 tablet 6  . gabapentin (NEURONTIN) 100 MG capsule Take 2 capsules (200 mg total) by mouth 3 (three) times daily. Take two 100 mg capsules three times a day for two weeks following surgery.Then take two 100 mg capsules two times a day for two weeks. Then take two 100 mg capsules once a day for two weeks.Then discontinue the Gabapentin. (Patient not taking: Reported on 03/31/2020) 168 capsule 0  . methocarbamol (ROBAXIN) 500 MG tablet Take 1 tablet (500 mg total) by mouth every 6 (six) hours as needed for muscle spasms. (Patient not taking: Reported on 03/31/2020) 40 tablet 0  .  oxyCODONE (OXY IR/ROXICODONE) 5 MG immediate release tablet Take 1-2 tablets (5-10 mg total) by mouth every 6 (six) hours as needed for severe pain. (Patient not taking: Reported on 03/31/2020) 56 tablet 0  . traMADol (ULTRAM) 50 MG tablet Take 1-2 tablets (50-100 mg total) by mouth every 6 (six) hours as needed for moderate pain. (Patient not taking: Reported on 03/31/2020) 40 tablet 0   No current facility-administered medications on file prior to visit.    ALLERGIES: No Known Allergies  FAMILY HISTORY: Family History  Problem Relation Age of Onset  . Renal Disease Mother   . Congestive Heart Failure Father   . Depression Sister     SOCIAL HISTORY: Social History   Socioeconomic History  . Marital status: Married    Spouse name: Not on file  . Number of children: 1  . Years of education: Not on file  . Highest education level: Not on file  Occupational History  . Not on file  Tobacco Use  . Smoking status: Never Smoker  . Smokeless tobacco: Never Used  Vaping Use  . Vaping Use: Never used  Substance and Sexual Activity  . Alcohol use: Yes    Alcohol/week: 2.0 standard drinks    Types: 1 Cans of beer, 1 Shots of liquor per week    Comment: daily  . Drug use: No  . Sexual activity: Not Currently  Other Topics Concern  . Not on file  Social History Narrative   Lives with wife and son in two story home      Right handed      Master's degree      Drinks caffeine (Coffee) daily   Social Determinants of Health   Financial Resource Strain:   . Difficulty of Paying Living Expenses:   Food Insecurity:   . Worried About Charity fundraiser in the Last Year:   . Arboriculturist in the Last Year:   Transportation Needs:   . Film/video editor (Medical):   Marland Kitchen Lack of Transportation (Non-Medical):   Physical Activity:   . Days of Exercise per Week:   . Minutes of Exercise per Session:   Stress:   . Feeling of Stress :   Social Connections:   . Frequency of  Communication  with Friends and Family:   . Frequency of Social Gatherings with Friends and Family:   . Attends Religious Services:   . Active Member of Clubs or Organizations:   . Attends Archivist Meetings:   Marland Kitchen Marital Status:   Intimate Partner Violence:   . Fear of Current or Ex-Partner:   . Emotionally Abused:   Marland Kitchen Physically Abused:   . Sexually Abused:      PHYSICAL EXAM: Vitals:   03/31/20 1444  BP: (!) 158/82  Pulse: 70  SpO2: 95%   General: No acute distress Head:  Normocephalic/atraumatic Skin/Extremities: No rash, no edema Neurological Exam: alert and oriented to person, place, and time. No aphasia or dysarthria. Fund of knowledge is appropriate.  Recent and remote memory are intact.  Attention and concentration are normal.  Dranial nerves: Pupils equal, round. Extraocular movements intact with no nystagmus. Visual fields full. . No facial asymmetry.  Motor: Bulk and tone normal, muscle strength 5/5 throughout with no pronator drift. Finger to nose testing intact.  Gait narrow-based, slightly favoring left due to foot pain. No ataxia   IMPRESSION: This is a 74 yo RH man with a history of 2 convulsive seizures in 2012. As he had been seizure-free for 3 years, Keppra was tapered off last spring/summer 2015. He had been taking clorazepate for anxiety and was tapered off this 3 days prior to the nocturnal seizure in 08/03/14. No further convulsions since 2015, however his wife reported episodes of loss of time while off Keppra, concerning for partial seizures arising from the temporal lobe. No further gaps in time since 2019. Continue Levetiracetam 750mg  BID, refills sent. He is aware of [place] driving laws to stop driving after a seizure until 6 months seizure-free. Follow-up in 1 year, he knows to call for any changes.    Thank you for allowing me to participate in his care.  Please do not hesitate to call for any questions or concerns.   Ellouise Newer, M.D.   CC:  Dr. Harrington Challenger

## 2020-04-08 DIAGNOSIS — M25572 Pain in left ankle and joints of left foot: Secondary | ICD-10-CM | POA: Diagnosis not present

## 2020-05-06 DIAGNOSIS — M65872 Other synovitis and tenosynovitis, left ankle and foot: Secondary | ICD-10-CM | POA: Diagnosis not present

## 2020-05-16 DIAGNOSIS — Z96652 Presence of left artificial knee joint: Secondary | ICD-10-CM | POA: Diagnosis not present

## 2020-05-16 DIAGNOSIS — Z471 Aftercare following joint replacement surgery: Secondary | ICD-10-CM | POA: Diagnosis not present

## 2020-05-16 DIAGNOSIS — Z96651 Presence of right artificial knee joint: Secondary | ICD-10-CM | POA: Diagnosis not present

## 2020-05-28 ENCOUNTER — Other Ambulatory Visit: Payer: Self-pay

## 2020-05-28 DIAGNOSIS — R0683 Snoring: Secondary | ICD-10-CM

## 2020-06-03 ENCOUNTER — Ambulatory Visit: Payer: Medicare PPO | Admitting: Cardiology

## 2020-06-03 ENCOUNTER — Encounter: Payer: Self-pay | Admitting: Cardiology

## 2020-06-03 ENCOUNTER — Other Ambulatory Visit: Payer: Self-pay

## 2020-06-03 VITALS — BP 146/86 | HR 60 | Ht 66.0 in | Wt 193.2 lb

## 2020-06-03 DIAGNOSIS — I5032 Chronic diastolic (congestive) heart failure: Secondary | ICD-10-CM | POA: Diagnosis not present

## 2020-06-03 DIAGNOSIS — I2510 Atherosclerotic heart disease of native coronary artery without angina pectoris: Secondary | ICD-10-CM

## 2020-06-03 DIAGNOSIS — I350 Nonrheumatic aortic (valve) stenosis: Secondary | ICD-10-CM | POA: Diagnosis not present

## 2020-06-03 DIAGNOSIS — E785 Hyperlipidemia, unspecified: Secondary | ICD-10-CM | POA: Diagnosis not present

## 2020-06-03 DIAGNOSIS — Z9861 Coronary angioplasty status: Secondary | ICD-10-CM | POA: Diagnosis not present

## 2020-06-03 DIAGNOSIS — I10 Essential (primary) hypertension: Secondary | ICD-10-CM

## 2020-06-03 DIAGNOSIS — I77819 Aortic ectasia, unspecified site: Secondary | ICD-10-CM | POA: Diagnosis not present

## 2020-06-03 MED ORDER — AMLODIPINE BESYLATE 2.5 MG PO TABS: 2.5000 mg | ORAL_TABLET | Freq: Every day | ORAL | 3 refills | Status: DC

## 2020-06-03 NOTE — Patient Instructions (Signed)
Medication Instructions:  Your physician has recommended you make the following change in your medication:  1) START taking amlodipine 2.5 mg daily *If you need a refill on your cardiac medications before your next appointment, please call your pharmacy*  Follow-Up: At [facility], you and your health needs are our priority.  As part of our continuing mission to provide you with exceptional heart care, we have created designated Provider Care Teams.  These Care Teams include your primary Cardiologist (physician) and Advanced Practice Providers (APPs -  Physician Assistants and Nurse Practitioners) who all work together to provide you with the care you need, when you need it.  Your next appointment:   1 year(s)  The format for your next appointment:   In Person  Provider:   You may see Fransico Him, MD or one of the following Advanced Practice Providers on your designated Care Team:    Melina Copa, PA-C  Ermalinda Barrios, PA-C  Other Instructions You have been referred to see PharmD in the hypertension clinic.

## 2020-06-03 NOTE — Addendum Note (Signed)
Addended by: Antonieta Iba on: 06/03/2020 10:09 AM   Modules accepted: Orders

## 2020-06-03 NOTE — Progress Notes (Signed)
Cardiology Office Note:    Date:  06/03/2020   ID:  Douglas Lane, DOB Jan 01, 1946, MRN [id number]  PCP:  Lawerance Cruel, MD  Cardiologist:  Fransico Him, MD    Referring MD: Lawerance Cruel, MD   Chief Complaint  Patient presents with  . Coronary Artery Disease  . Aortic Stenosis  . Congestive Heart Failure    History of Present Illness:    Douglas Lane is a 74 y.o. male with a hx of HLD, CAD s/p DES to RCA (03/2016)and 40% prox LAD and 50% ostial PDA,HTN, chronic diastolic CHF and severe AS s/p TAVR (03/22/17) with a49mm Edwards Sapien 3 THV via the TF approach.Echo post TAVR showed normal LVF with G2DD with stable TAVR with AVA 2cm2, mean AVG 70mmHg and peak AVG 54mmHg.  He is here today for followup and is doing well.  He denies any chest pain or pressure, SOB, DOE, PND, orthopnea, LE edema, dizziness, palpitations or syncope. He is compliant with his meds and is tolerating meds with no SE.    Past Medical History:  Diagnosis Date  . Aortic atherosclerosis ([place]) 03/08/2017   CT  . Aortic stenosis    severe AS s/p TAVR (03/22/17) with a 54mm Edwards Sapien 3 THV via the TF approach  . Arthritis    "pain in my knees" (03/30/2016)  . Bilateral cataracts   . CAD S/P percutaneous coronary angioplasty 03/31/2016   a. CP/USA -> LHC 03/31/16: 90% prox-mid RCA s/p DES, 40% prox-mid LAD, 25% D2, 25% prox-mid Cx, elevated LVEDP 33.  . Chronic diastolic CHF (congestive heart failure) ([place]) 03/31/2016   pt unaware, has family history  . Chronic lower back pain   . DDD (degenerative disc disease), lumbar   . Dyslipidemia   . Grade II diastolic dysfunction   . Hearing loss   . Heart murmur   . Heartburn    occ. takes TUMS  . Humeral fracture 07/2014   Right  . Kidney cysts    1.6 cm simple cyst right  . LVH (left ventricular hypertrophy)    a. severe basal septal hypertrophy by echo 03/2016.  . Mitral regurgitation    a. mild by echo 03/2016.  . Osteopenia 03/13/2009   . Psoriasis   . Rheumatic fever    as a child  . Scoliosis 03/13/2009  . Seizure ([place]) 01/2011; 07/2011; 07/2015   "idiopathic; been on Kepra since 07/2011", last seizure poss. 07/2015, absent seizure  . Sinus bradycardia - baseline HR 50s at times.   . 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 Thrombosed hemorrhoids     Past Surgical History:  Procedure Laterality Date  . APPENDECTOMY    . BACK SURGERY    . CARDIAC CATHETERIZATION N/A 03/30/2016   Procedure: Right/Left Heart Cath and Coronary Angiography;  Surgeon: Jettie Booze, MD;  Location: [facility];  Service: Cardiovascular;  Laterality: N/A;  . CARDIAC CATHETERIZATION N/A 03/30/2016   Procedure: Coronary Stent Intervention;  Surgeon: Jettie Booze, MD;  Location: [facility];  Service: Cardiovascular;  Laterality: N/A;  . CATARACT EXTRACTION, BILATERAL    . COLONOSCOPY    . CORONARY ANGIOPLASTY    . FOREARM FRACTURE SURGERY Right ~ 2003-2004   "got steel plate in there"  . HERNIA REPAIR  2009  . I&D hemorrhoirds    . KNEE ARTHROSCOPY Bilateral   . MOHS SURGERY Left    temple  .  ORIF SCAPULAR FRACTURE Right    right  . POSTERIOR LUMBAR FUSION  2004  . TEE WITHOUT CARDIOVERSION N/A 11/01/2016   Procedure: TRANSESOPHAGEAL ECHOCARDIOGRAM (TEE);  Surgeon: Sanda Klein, MD;  Location: [facility];  Service: Cardiovascular;  Laterality: N/A;  . TEE WITHOUT CARDIOVERSION N/A 03/22/2017   Procedure: TRANSESOPHAGEAL ECHOCARDIOGRAM (TEE);  Surgeon: Sherren Mocha, MD;  Location: [facility];  Service: Open Heart Surgery;  Laterality: N/A;  . TONSILLECTOMY    . TOTAL KNEE ARTHROPLASTY Right 07/24/2018   Procedure: RIGHT TOTAL KNEE ARTHROPLASTY;  Surgeon: Gaynelle Arabian, MD;  Location: [facility];  Service: Orthopedics;  Laterality: Right;  82min  . TOTAL KNEE ARTHROPLASTY Left 06/11/2019   Procedure: TOTAL KNEE ARTHROPLASTY;  Surgeon: Gaynelle Arabian, MD;  Location: [facility];  Service:  Orthopedics;  Laterality: Left;  59min  . TRANSCATHETER AORTIC VALVE REPLACEMENT, TRANSFEMORAL N/A 03/22/2017   Procedure: TRANSCATHETER AORTIC VALVE REPLACEMENT, TRANSFEMORAL;  Surgeon: Sherren Mocha, MD;  Location: [facility];  Service: Open Heart Surgery;  Laterality: N/A;    Current Medications: Current Meds  Medication Sig  . aspirin EC 81 MG tablet Take 81 mg by mouth daily. Swallow whole.  . calcium carbonate (TUMS - DOSED IN MG ELEMENTAL CALCIUM) 500 MG chewable tablet Chew 1 tablet by mouth daily as needed for heartburn.   . clobetasol (TEMOVATE) 0.05 % external solution Apply 1 application topically 2 (two) times daily as needed (for psoriasis).  . clobetasol ointment (TEMOVATE) 5.17 % Apply 1 application topically 2 (two) times daily as needed (for psoriasis).  . clonazePAM (KLONOPIN) 0.5 MG tablet Take 0.5 mg by mouth 2 (two) times daily as needed for anxiety.  Marland Kitchen desonide (DESOWEN) 0.05 % ointment Apply 1 application topically daily as needed (psoriasis).   . diphenhydrAMINE (BENADRYL) 25 mg capsule Take 25 mg by mouth daily as needed for allergies.  Marland Kitchen ezetimibe (ZETIA) 10 MG tablet TAKE 1 TABLET BY MOUTH DAILY  . HUMIRA PEN 40 MG/0.8ML PNKT Inject 40 mg into the skin See admin instructions. Inject 40 mg SQ every 6 weeks  . ibuprofen (ADVIL) 200 MG tablet Take 200 mg by mouth every 6 (six) hours as needed.  . levETIRAcetam (KEPPRA) 750 MG tablet Take 1 tablet (750 mg total) by mouth 2 (two) times daily.  . nitroGLYCERIN (NITROSTAT) 0.4 MG SL tablet DISSOLVE 1 TABLET UNDER THE TONGUE EVERY 5 MINUTES AS NEEDED FOR CHEST PAIN( UP TO 3 DOSES)  . PROCTOZONE-HC 2.5 % rectal cream Apply 1 application topically daily as needed for hemorrhoids or itching.   . rosuvastatin (CRESTOR) 40 MG tablet TAKE 1 TABLET(40 MG) BY MOUTH DAILY  . shark liver oil-cocoa butter (PREPARATION H) 0.25-3-85.5 % suppository Place 1 suppository rectally as needed for hemorrhoids.  . terbinafine (LAMISIL) 250 MG tablet  Take 250 mg by mouth See admin instructions. Takes 1  Week out of each month     Allergies:   Patient has no known allergies.   Social History   Socioeconomic History  . Marital status: Married    Spouse name: Not on file  . Number of children: 1  . Years of education: Not on file  . Highest education level: Not on file  Occupational History  . Not on file  Tobacco Use  . Smoking status: Never Smoker  . Smokeless tobacco: Never Used  Vaping Use  . Vaping Use: Never used  Substance and Sexual Activity  . Alcohol use: Yes    Alcohol/week: 2.0 standard drinks    Types:  1 Cans of beer, 1 Shots of liquor per week    Comment: daily  . Drug use: No  . Sexual activity: Not Currently  Other Topics Concern  . Not on file  Social History Narrative   Lives with wife and son in two story home      Right handed      Master's degree      Drinks caffeine (Coffee) daily   Social Determinants of Health   Financial Resource Strain:   . Difficulty of Paying Living Expenses: Not on file  Food Insecurity:   . Worried About Charity fundraiser in the Last Year: Not on file  . Ran Out of Food in the Last Year: Not on file  Transportation Needs:   . Lack of Transportation (Medical): Not on file  . Lack of Transportation (Non-Medical): Not on file  Physical Activity:   . Days of Exercise per Week: Not on file  . Minutes of Exercise per Session: Not on file  Stress:   . Feeling of Stress : Not on file  Social Connections:   . Frequency of Communication with Friends and Family: Not on file  . Frequency of Social Gatherings with Friends and Family: Not on file  . Attends Religious Services: Not on file  . Active Member of Clubs or Organizations: Not on file  . Attends Archivist Meetings: Not on file  . Marital Status: Not on file     Family History: The patient's family history includes Congestive Heart Failure in his father; Depression in his sister; Renal Disease in his  mother.  ROS:   Please see the history of present illness.    ROS  All other systems reviewed and negative.   EKGs/Labs/Other Studies Reviewed:    The following studies were reviewed today: none  EKG:  EKG is ordered today and showed NSR with LBBB  Recent Labs: 06/06/2019: ALT 37 06/12/2019: BUN 20; Creatinine, Ser 0.97; Hemoglobin 12.5; Platelets 151; Potassium 4.1; Sodium 139   Recent Lipid Panel    Component Value Date/Time   CHOL 125 03/21/2019 0746   TRIG 67 03/21/2019 0746   HDL 52 03/21/2019 0746   CHOLHDL 2.4 03/21/2019 0746   CHOLHDL 2.6 08/06/2016 0831   VLDL 16 08/06/2016 0831   LDLCALC 60 03/21/2019 0746    Physical Exam:    VS:  BP (!) 146/86   Pulse 60   Ht 5\' 6"  (1.676 m)   Wt 193 lb 3.2 oz (87.6 kg)   SpO2 97%   BMI 31.18 kg/m     Wt Readings from Last 3 Encounters:  06/03/20 193 lb 3.2 oz (87.6 kg)  03/31/20 192 lb (87.1 kg)  06/11/19 190 lb (86.2 kg)     GEN: Well nourished, well developed in no acute distress HEENT: Normal NECK: No JVD; No carotid bruits LYMPHATICS: No lymphadenopathy CARDIAC:RRR, no  rubs, gallops.  1/6 SM at LLSB to apex RESPIRATORY:  Clear to auscultation without rales, wheezing or rhonchi  ABDOMEN: Soft, non-tender, non-distended MUSCULOSKELETAL:  No edema; No deformity  SKIN: Warm and dry NEUROLOGIC:  Alert and oriented x 3 PSYCHIATRIC:  Normal affect    ASSESSMENT:    1. CAD S/P percutaneous coronary angioplasty   2. Severe aortic stenosis   3. Chronic diastolic heart failure ([place])   4. Benign essential HTN   5. Hyperlipidemia, unspecified hyperlipidemia type   6. Aortic dilatation ([facility])    PLAN:    In order of  problems listed above:  1.  ASCAD -s/p DES to RCA (03/2016)and 40% prox LAD and 50% ostial PDA -he denies any anginal symptoms -EKG today with new LBBB -he is asymptomatic -continue ASA and statin.   2.  Severe AS -s/p TAVR (03/22/17) with a35mm Edwards Sapien 3 THV via the TF  approach -doing well with no sx -echo 10/2018 showed stable TAVR with mean AVG 76mmHg and trivial perivavular AR -continue ASA  3.  Chronic diastolic CHF -he does not appear volume overloaded on exam today -he has not required any diuretic therapy  4.  HTN -BP borderline  controlled on exam today -reminded him to follow a 2gm Na diet and avoid processed meats and table salt -I have recommended adding Amlodipine 2.5mg  daily -Followup with PharmD in 2 weeks to readdress BP -goal BP is 130/69mmHg or below  5.  HLD -LDL goal < 70.   -LDL was 56 in July 2021 -continue Zetial 10mg  daily and Crestor 40mg  daily  6.  Mild dilated ascending aorta -measured 56mm by echo 10/2018 -BP borderline controlled -continue statin   Medication Adjustments/Labs and Tests Ordered: Current medicines are reviewed at length with the patient today.  Concerns regarding medicines are outlined above.  Orders Placed This Encounter  Procedures  . EKG 12-Lead   No orders of the defined types were placed in this encounter.   Signed, Fransico Him, MD  06/03/2020 9:55 AM    [place]

## 2020-06-13 ENCOUNTER — Other Ambulatory Visit: Payer: Self-pay | Admitting: Cardiology

## 2020-06-16 ENCOUNTER — Telehealth: Payer: Self-pay

## 2020-06-16 NOTE — Progress Notes (Signed)
Patient ID: Douglas Lane                 DOB: May 09, 1946                      MRN: [id number]     HPI: Douglas Lane is a 74 y.o. male referred by Dr. Radford Lane to HTN clinic. 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 Last visit with Dr. Radford Lane on 06/03/20 and BP was elevated at 146/86 during which amlodipine 2.5 mg daily was started.  Patient presents today in good spirits. Reports medication adherence and no side effects with amlodipine. Reports not checking BP at home, has been very long since last used and doesn't know if readings are accurate. Denies dizziness, headaches, blurred vision, and swelling. Discussed diet and exercise (see below).  Current HTN meds: amlodipine 2.5 mg daily (PM) Previously tried: none BP goal: <130/80 mmHg  Family History: Congestive Heart Failure in his father; Depression in his sister; Renal Disease in his mother  Social History: denies tobacco use; 1 cans of beer and 1 shot of liquor (bourbon) per day  Diet:  -Drinks 2-3 cups of caffeine coffee daily, orange juice, lemonade; no carbonated drinks -limits salt -cold cut sandwiches with [place], ham, or chicken, with whole wheat bread; red meat 1-2x/month -prefers fish or chicken -pasta, spaghetti, brown rice, beans, vegetables  -breakfast: piece of whole wheat bread with melted cheddar on top, cold cereal, oatmeal with dried cranberries -snacks: potatoe chips  Exercise: Use to go to [facility] prior to COVID; enjoys yard work - planting and mowing  Home BP readings: not checking  Wt Readings from Last 3 Encounters:  06/03/20 193 lb 3.2 oz (87.6 kg)  03/31/20 192 lb (87.1 kg)  06/11/19 190 lb (86.2 kg)   BP Readings from Last 3 Encounters:  06/17/20 (!) 146/80  06/03/20 (!) 146/86  03/31/20 (!) 158/82   Pulse Readings from Last 3 Encounters:  06/17/20 72  06/03/20  60  03/31/20 70    Renal function: CrCl cannot be calculated (Patient's most recent lab result is older than the maximum 21 days allowed.).  Past Medical History:  Diagnosis Date   Aortic atherosclerosis ([place]) 03/08/2017   CT   Aortic stenosis    severe AS s/p TAVR (03/22/17) with a 11mm Edwards Sapien 3 THV via the TF approach   Arthritis    "pain in my knees" (03/30/2016)   Bilateral cataracts    CAD S/P percutaneous coronary angioplasty 03/31/2016   a. CP/USA -> LHC 03/31/16: 90% prox-mid RCA s/p DES, 40% prox-mid LAD, 25% D2, 25% prox-mid Cx, elevated LVEDP 33.   Chronic diastolic CHF (congestive heart failure) ([place]) 03/31/2016   pt unaware, has family history   Chronic lower back pain    DDD (degenerative disc disease), lumbar    Dyslipidemia    Grade II diastolic dysfunction    Hearing loss    Heart murmur    Heartburn    occ. takes TUMS   Humeral fracture 07/2014   Right   Kidney cysts    1.6 cm simple cyst right   LVH (left ventricular hypertrophy)    a. severe basal septal hypertrophy by echo 03/2016.   Mitral regurgitation    a. mild by echo 03/2016.   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 07/2015, absent seizure   Sinus bradycardia - baseline HR 50s at times.    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 Swallow whole.     calcium carbonate (TUMS - DOSED IN MG ELEMENTAL CALCIUM) 500 MG chewable tablet Chew 1 tablet by mouth daily as needed for heartburn.      clobetasol (TEMOVATE) 0.05 % external solution Apply 1 application topically 2 (two) times daily as needed (for psoriasis).      clobetasol ointment (TEMOVATE) 3.00 % Apply 1 application topically 2 (two) times daily as needed (for psoriasis).     clonazePAM (KLONOPIN) 0.5 MG tablet Take 0.5 mg by mouth 2 (two) times daily as needed for anxiety.     desonide (DESOWEN) 0.05 % ointment Apply 1 application topically daily as needed (psoriasis).      diphenhydrAMINE (BENADRYL) 25 mg capsule Take 25 mg by mouth daily as needed for allergies.     ezetimibe (ZETIA) 10 MG tablet TAKE 1 TABLET BY MOUTH DAILY 90 tablet 3   HUMIRA PEN 40 MG/0.8ML PNKT Inject 40 mg into the skin See admin instructions. Inject 40 mg SQ every 6 weeks     ibuprofen (ADVIL) 200 MG tablet Take 200 mg by mouth every 6 (six) hours as needed.     levETIRAcetam (KEPPRA) 750 MG tablet Take 1 tablet (750 mg total) by mouth 2 (two) times daily. 180 tablet 3   nitroGLYCERIN (NITROSTAT) 0.4 MG SL tablet DISSOLVE 1 TABLET UNDER THE TONGUE EVERY 5 MINUTES AS NEEDED FOR CHEST PAIN( UP TO 3 DOSES) 25 tablet 5   PROCTOZONE-HC 2.5 % rectal cream Apply 1 application topically daily as needed for hemorrhoids or itching.      rosuvastatin (CRESTOR) 40 MG tablet TAKE 1 TABLET(40 MG) BY MOUTH DAILY 90 tablet 3   shark liver oil-cocoa butter (PREPARATION H) 0.25-3-85.5 % suppository Place 1 suppository rectally as needed for hemorrhoids.     terbinafine (LAMISIL) 250 MG tablet Take 250 mg by mouth See admin instructions. Takes 1  Week out of each month     No current facility-administered medications on file prior to visit.    No Known Allergies  Assessment/Plan:  1. Hypertension - BP above goal <130/80 mmHg and is not checking BP at home. Medication adherence appears optimal. Discussed the importance of checking BP at home at least 1 hour after taking medications. Discussed that taking BP at home will help thoroughly assess if his BP is actually controlled. Instructed patient to bring BP monitor to next visit. Patient verbalized understanding. Will increase  amlodipine to 5 mg daily. Also, discussed the importance of eating a healthy diet consisting of vegetables, fruit and lean meats (chicken, [place], fish) and to limit salt intake by eating fresh or frozen vegetables (instead of canned), rinse canned vegetables prior to cooking and do not add any additional salt to meals. Counseled on exercising at least 15-30 minutes daily with the goal of 150 minutes per week. Follow-up visit scheduled in 1 month to assess BP home readings.   Lorel Monaco, PharmD PGY2 Ambulatory Care Pharmacy Resident [facility] [address] [place], [place] [address] Phone: [phone]; Fax: [phone]

## 2020-06-16 NOTE — Telephone Encounter (Signed)
No PA needed ref number [id number] Sleep study center called given this information  Pt has [employer],

## 2020-06-17 ENCOUNTER — Other Ambulatory Visit: Payer: Self-pay

## 2020-06-17 ENCOUNTER — Encounter: Payer: Self-pay | Admitting: Pharmacist

## 2020-06-17 ENCOUNTER — Ambulatory Visit (INDEPENDENT_AMBULATORY_CARE_PROVIDER_SITE_OTHER): Payer: Medicare PPO | Admitting: Pharmacist

## 2020-06-17 VITALS — BP 146/80 | HR 72

## 2020-06-17 DIAGNOSIS — I10 Essential (primary) hypertension: Secondary | ICD-10-CM

## 2020-06-17 MED ORDER — AMLODIPINE BESYLATE 5 MG PO TABS: 5.0000 mg | ORAL_TABLET | Freq: Every day | ORAL | 3 refills | Status: DC

## 2020-06-17 NOTE — Patient Instructions (Addendum)
It was nice to see you today!  Your goal blood pressure is <130/80 mmHg. In clinic, your blood pressure was 146/80 mmHg.  Medication Changes: Begin taking Amlodipine 5 mg daily  Monitor blood pressure at home daily and keep a log (on your phone or piece of paper) to bring with you to your next visit. Write down date, time, blood pressure and pulse.  Keep up the good work with diet and exercise. Aim for a diet full of vegetables, fruit and lean meats (chicken, [place], fish). Try to limit salt intake by eating fresh or frozen vegetables (instead of canned), rinse canned vegetables prior to cooking and do not add any additional salt to meals.   Please give [place] a call at [phone] with any questions or concerns.  See you in 1 month on Tuesday, November 30th @ 10:30AM

## 2020-07-03 DIAGNOSIS — L821 Other seborrheic keratosis: Secondary | ICD-10-CM | POA: Diagnosis not present

## 2020-07-03 DIAGNOSIS — L570 Actinic keratosis: Secondary | ICD-10-CM | POA: Diagnosis not present

## 2020-07-03 DIAGNOSIS — C44519 Basal cell carcinoma of skin of other part of trunk: Secondary | ICD-10-CM | POA: Diagnosis not present

## 2020-07-03 DIAGNOSIS — D485 Neoplasm of uncertain behavior of skin: Secondary | ICD-10-CM | POA: Diagnosis not present

## 2020-07-03 DIAGNOSIS — L820 Inflamed seborrheic keratosis: Secondary | ICD-10-CM | POA: Diagnosis not present

## 2020-07-03 DIAGNOSIS — D1801 Hemangioma of skin and subcutaneous tissue: Secondary | ICD-10-CM | POA: Diagnosis not present

## 2020-07-03 DIAGNOSIS — B078 Other viral warts: Secondary | ICD-10-CM | POA: Diagnosis not present

## 2020-07-03 DIAGNOSIS — L400 Psoriasis vulgaris: Secondary | ICD-10-CM | POA: Diagnosis not present

## 2020-07-03 DIAGNOSIS — Z85828 Personal history of other malignant neoplasm of skin: Secondary | ICD-10-CM | POA: Diagnosis not present

## 2020-07-14 NOTE — Progress Notes (Signed)
Patient ID: Douglas Lane                 DOB: Feb 27, 1946                      MRN: [id number]     HPI: Douglas Lane is a 74 y.o. male referred by Dr. Radford Pax to HTN clinic. 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 Pt was last seen in HTN clinic on 06/17/20 at which time BP was elevated at 146/80 and amlodipine was increased to 5 mg daily.   Patient presents today in good spirits. Reports medication adherence and no side effects with amlodipine. Denies dizziness, headaches, blurred vision, and LE swelling. Patient brought home BP monitor and reports home BP range from 130-150s/64-80s with a high of 169/97 and 154/82 (see below).  Current HTN meds: amlodipine 5 mg daily (PM) Previously tried: none BP goal: <130/80 mmHg  Family History: Congestive Heart Failure in his father; Depression in his sister; Renal Disease in his mother  Social History: denies tobacco use; 1 cans of beer and 1 shot of liquor (bourbon) per day   Diet:  -Drinks 2-3 cups of caffeine coffee daily, orange juice, lemonade; no carbonated drinks -limits salt -cold cut sandwiches with [place], ham, or chicken, with whole wheat bread; red meat 1-2x/month -prefers fish or chicken -pasta, spaghetti, brown rice, beans, vegetables  -breakfast: piece of whole wheat bread with melted cheddar on top, cold cereal, oatmeal with dried cranberries -snacks: potatoe chips  Exercise: Use to go to [facility] prior to COVID; enjoys yard work - planting and mowing   Home BP readings: 11/2 - 11/20 132/74, 142/82, 130/82, 135/88, 169/97, 145/79, 141/81, 126/74, 130/76, 142/77, 154/82, 138/78, 135/73, 129/72, 116/69, 119/64, 126/64, 122/66, 143/79, 134/69   Clinic Home BP monitor reading: 143/80, HR 76  Labs: BMET July 2021 - Scr 0.8, K 4.6, Na 141  Wt Readings from Last 3 Encounters:  06/03/20 193 lb  3.2 oz (87.6 kg)  03/31/20 192 lb (87.1 kg)  06/11/19 190 lb (86.2 kg)   BP Readings from Last 3 Encounters:  07/15/20 136/76  06/17/20 (!) 146/80  06/03/20 (!) 146/86   Pulse Readings from Last 3 Encounters:  07/15/20 81  06/17/20 72  06/03/20 60    Renal function: CrCl cannot be calculated (Patient's most recent lab result is older than the maximum 21 days allowed.).  Past Medical History:  Diagnosis Date  . Aortic atherosclerosis ([place]) 03/08/2017   CT  . Aortic stenosis    severe AS s/p TAVR (03/22/17) with a 3mm Edwards Sapien 3 THV via the TF approach  . Arthritis    "pain in my knees" (03/30/2016)  . Bilateral cataracts   . CAD S/P percutaneous coronary angioplasty 03/31/2016   a. CP/USA -> LHC 03/31/16: 90% prox-mid RCA s/p DES, 40% prox-mid LAD, 25% D2, 25% prox-mid Cx, elevated LVEDP 33.  . Chronic diastolic CHF (congestive heart failure) ([place]) 03/31/2016   pt unaware, has family history  . Chronic lower back pain   . DDD (degenerative disc disease), lumbar   . Dyslipidemia   . Grade II diastolic dysfunction   . Hearing loss   . Heart murmur   . Heartburn    occ. takes TUMS  . Humeral fracture 07/2014   Right  . Kidney cysts  1.6 cm simple cyst right  . LVH (left ventricular hypertrophy)    a. severe basal septal hypertrophy by echo 03/2016.  . Mitral regurgitation    a. mild by echo 03/2016.  . Osteopenia 03/13/2009  . Psoriasis   . Rheumatic fever    as a child  . Scoliosis 03/13/2009  . Seizure ([place]) 01/2011; 07/2011; 07/2015   "idiopathic; been on Kepra since 07/2011", last seizure poss. 07/2015, absent seizure  . Sinus bradycardia - baseline HR 50s at times.   . 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 Thrombosed hemorrhoids     Current Outpatient Medications on File Prior to Visit  Medication Sig Dispense Refill  . amLODipine (NORVASC) 5 MG tablet Take 1 tablet (5 mg total) by mouth daily. 30 tablet 3  . aspirin EC 81 MG  tablet Take 81 mg by mouth daily. Swallow whole.    . calcium carbonate (TUMS - DOSED IN MG ELEMENTAL CALCIUM) 500 MG chewable tablet Chew 1 tablet by mouth daily as needed for heartburn.     . clobetasol (TEMOVATE) 0.05 % external solution Apply 1 application topically 2 (two) times daily as needed (for psoriasis).    . clobetasol ointment (TEMOVATE) 9.50 % Apply 1 application topically 2 (two) times daily as needed (for psoriasis).    . clonazePAM (KLONOPIN) 0.5 MG tablet Take 0.5 mg by mouth 2 (two) times daily as needed for anxiety.    Marland Kitchen desonide (DESOWEN) 0.05 % ointment Apply 1 application topically daily as needed (psoriasis).     . diphenhydrAMINE (BENADRYL) 25 mg capsule Take 25 mg by mouth daily as needed for allergies.    Marland Kitchen ezetimibe (ZETIA) 10 MG tablet TAKE 1 TABLET BY MOUTH DAILY 90 tablet 3  . HUMIRA PEN 40 MG/0.8ML PNKT Inject 40 mg into the skin See admin instructions. Inject 40 mg SQ every 6 weeks    . ibuprofen (ADVIL) 200 MG tablet Take 200 mg by mouth every 6 (six) hours as needed.    . levETIRAcetam (KEPPRA) 750 MG tablet Take 1 tablet (750 mg total) by mouth 2 (two) times daily. 180 tablet 3  . nitroGLYCERIN (NITROSTAT) 0.4 MG SL tablet DISSOLVE 1 TABLET UNDER THE TONGUE EVERY 5 MINUTES AS NEEDED FOR CHEST PAIN( UP TO 3 DOSES) 25 tablet 5  . PROCTOZONE-HC 2.5 % rectal cream Apply 1 application topically daily as needed for hemorrhoids or itching.     . rosuvastatin (CRESTOR) 40 MG tablet TAKE 1 TABLET(40 MG) BY MOUTH DAILY 90 tablet 3  . shark liver oil-cocoa butter (PREPARATION H) 0.25-3-85.5 % suppository Place 1 suppository rectally as needed for hemorrhoids.    . terbinafine (LAMISIL) 250 MG tablet Take 250 mg by mouth See admin instructions. Takes 1  Week out of each month     No current facility-administered medications on file prior to visit.    No Known Allergies   Assessment/Plan:  1. Hypertension - BP above goal <130/80 mmHg. Clinic BP was 136/76 and home BP  monitor reading was 932/67 (systolic difference of 7 mmHg). Medication adherence appears optimal, however average home BP readings remain moderately elevated. Will increase amlodipine to 10 mg daily. Discussed potential side effects and encouraged patient to continue checking BP at least 1 hour after taking medications. Patient verbalized understanding. Follow-up appointment scheduled in 1 month.  Lorel Monaco, PharmD, [place] PGY2 Ambulatory Care Resident [place]

## 2020-07-15 ENCOUNTER — Encounter: Payer: Self-pay | Admitting: Pharmacist

## 2020-07-15 ENCOUNTER — Other Ambulatory Visit: Payer: Self-pay

## 2020-07-15 ENCOUNTER — Ambulatory Visit (INDEPENDENT_AMBULATORY_CARE_PROVIDER_SITE_OTHER): Payer: Medicare PPO | Admitting: Pharmacist

## 2020-07-15 ENCOUNTER — Ambulatory Visit (HOSPITAL_BASED_OUTPATIENT_CLINIC_OR_DEPARTMENT_OTHER): Payer: Medicare PPO | Attending: Neurology | Admitting: Internal Medicine

## 2020-07-15 DIAGNOSIS — G4733 Obstructive sleep apnea (adult) (pediatric): Secondary | ICD-10-CM | POA: Insufficient documentation

## 2020-07-15 DIAGNOSIS — I10 Essential (primary) hypertension: Secondary | ICD-10-CM

## 2020-07-15 DIAGNOSIS — G4736 Sleep related hypoventilation in conditions classified elsewhere: Secondary | ICD-10-CM | POA: Insufficient documentation

## 2020-07-15 DIAGNOSIS — R0683 Snoring: Secondary | ICD-10-CM | POA: Insufficient documentation

## 2020-07-15 MED ORDER — AMLODIPINE BESYLATE 10 MG PO TABS: 10.0000 mg | ORAL_TABLET | Freq: Every day | ORAL | 3 refills | Status: DC

## 2020-07-15 NOTE — Patient Instructions (Addendum)
It was nice to see you today!  Your goal blood pressure is <130/80 mmHg. In clinic, your blood pressure was 136/76 mmHg.  Medication Changes: Begin taking amlodipine 10 mg daily  Monitor blood pressure at home daily and keep a log (on your phone or piece of paper) to bring with you to your next visit. Write down date, time, blood pressure and pulse.  Keep up the good work with diet and exercise. Aim for a diet full of vegetables, fruit and lean meats (chicken, [place], fish). Try to limit salt intake by eating fresh or frozen vegetables (instead of canned), rinse canned vegetables prior to cooking and do not add any additional salt to meals.   Please give [place] a call at [phone] with any questions or concerns.  See you in a month!

## 2020-07-19 DIAGNOSIS — R0683 Snoring: Secondary | ICD-10-CM

## 2020-07-19 DIAGNOSIS — G4736 Sleep related hypoventilation in conditions classified elsewhere: Secondary | ICD-10-CM | POA: Diagnosis not present

## 2020-07-19 DIAGNOSIS — G4733 Obstructive sleep apnea (adult) (pediatric): Secondary | ICD-10-CM | POA: Diagnosis not present

## 2020-07-19 NOTE — Procedures (Signed)
   Patient Name: Douglas Lane, Brase Date: 07/15/2020 Gender: Male D.O.B: 03/19/1946 Age (years): 30 Referring Provider: Cameron Sprang Height (inches): 71 Interpreting Physician: Baird Lyons MD, ABSM Weight (lbs): 188 RPSGT: Jacolyn Reedy BMI: 30 MRN: [id number] Neck Size: 17.00  CLINICAL INFORMATION Sleep Study Type: HST Indication for sleep study: OSA Epworth Sleepiness Score: 6  SLEEP STUDY TECHNIQUE A multi-channel overnight portable sleep study was performed. The channels recorded were: nasal airflow, thoracic respiratory movement, and oxygen saturation with a pulse oximetry. Snoring was also monitored.  MEDICATIONS Patient self administered medications include: none reported.  SLEEP ARCHITECTURE Patient was studied for 419 minutes. The sleep efficiency was 100.0 % and the patient was supine for 19.1%. The arousal index was 0.0 per hour.  RESPIRATORY PARAMETERS The overall AHI was 52.1 per hour, with a central apnea index of 0.0 per hour. The oxygen nadir was 75% during sleep.  CARDIAC DATA Mean heart rate during sleep was 66.4 bpm.  IMPRESSIONS - Severe obstructive sleep apnea occurred during this study (AHI = 52.1/h). - No significant central sleep apnea occurred during this study (CAI = 0.0/h). - xygen desaturation was noted during this study (Min O2 = 75%). Mean O2 sat 89%. - Patient snored.  DIAGNOSIS - Obstructive Sleep Apnea (G47.33) - Nocturnal Hypoxemia (G47.36)  RECOMMENDATIONS - Suggest CPAP titiration sleep study or autopap. - Be careful with alcohol, sedatives and other CNS depressants that may worsen sleep apnea and disrupt normal sleep architecture. - Sleep hygiene should be reviewed to assess factors that may improve sleep quality. - Weight management and regular exercise should be initiated or continued. - Patient may benefit from in-lab study to document control of hypoxemia with CPAP, or need for supplemental O2.  [Electronically  signed] 07/19/2020 11:34 AM  Baird Lyons MD, ABSM Diplomate, American Board of Sleep Medicine   NPI: [id number]                          [place], [place] [facility]  ELECTRONICALLY SIGNED ON:  07/19/2020, 11:31 AM [place] PH: [phone]   FX: [phone] [place]

## 2020-07-21 ENCOUNTER — Telehealth: Payer: Self-pay

## 2020-07-21 ENCOUNTER — Other Ambulatory Visit: Payer: Self-pay

## 2020-07-21 DIAGNOSIS — G4730 Sleep apnea, unspecified: Secondary | ICD-10-CM

## 2020-07-21 NOTE — Progress Notes (Signed)
Pls let patient know that the sleep study showed severe sleep apnea. Recommendation is starting to use CPAP, pls order CPAP titration. Also pls let him know he will be referred to a sleep specialist to follow-up on this, pls send referral to Pulmonary, Dx: severe sleep apnea. Thanks!

## 2020-07-21 NOTE — Telephone Encounter (Signed)
-----   Message from Cameron Sprang, MD sent at 07/21/2020  7:13 AM EST -----   ----- Message ----- From: Deneise Lever, MD Sent: 07/19/2020  11:38 AM EST To: Cameron Sprang, MD

## 2020-07-21 NOTE — Telephone Encounter (Signed)
Spoke with MR Douglas Lane and informed him that the sleep study showed severe sleep apnea. Recommendation is starting to use CPAP  Also that he will be referred to a sleep specialist to follow-up on this. Pt had many questions about the CPAP I answered them as best as I could pt advised that when Pulmonary called they would be able to answer his questions better because that is what they specialize in.

## 2020-08-10 NOTE — Progress Notes (Signed)
Patient ID: PHI AVANS                 DOB: Aug 22, 1945                      MRN: [id number]     HPI: Douglas Lane a 74 y.o.malereferred by Dr. Carlye Grippe HTN clinic.PMH is significant for HLD, CAD s/p DES to RCA (03/2016)and 40% prox LAD and 50% ostial PDA,HTN, chronic diastolic CHF and severe AS s/p TAVR (03/22/17), EF 60-65% (2020), seizures.Pt was last seen in HTN clinic on 07/15/20 at which clinic BP 136/76 was near goal, however average home BP readings remain moderately elevated. Amlodipine was increased from 5 mg to 10 mg daily. Additionally, patient now with severe sleep apnea requiring CPAP. Referred to a sleep specialist to follow-up on this.  Patient presents today in good spirits. Reports medication adherence and no side effects with amlodipine in the evenings. Denies dizziness, headaches, blurred vision, LE swelling, and unsteady gait. Home BP log ranges from 110s-120s/60-70s. Reports drinking 2 cups of coffee <30 minutes ago.  Current HTN meds:amlodipine 10 mg daily(PM) Previously tried:none BP goal:<130/80 mmHg  Family History:Congestive Heart Failure in his father; Depression in his sister; Renal Disease in his mother  Social History:denies tobacco use;1cans of beerand1 shot of liquor(bourbon)perday   Diet: -Drinks2-3 cups ofcaffeine coffee daily, orange juice, lemonade; no carbonated drinks -limits salt -cold cut sandwiches with [place], ham, or chicken, with whole wheat bread; red meat 1-2x/month -prefers fish or chicken -pasta, spaghetti, brown rice, beans, vegetables  -breakfast: piece of whole wheat bread with melted cheddar on top, cold cereal, oatmeal with dried cranberries -snacks: potatoe chips  Exercise:Use to go to [facility] prior to [place]; enjoys yard work - planting and mowing   Home BP readings: 11/30 to 12/27 BP: 126/70, 115/66, 112/63, 130/75, 126/77, 115/73, 131/60, 150/67, 130/72, 133/77, 125/72, 124/71, 143/80, 129/73, 124/74,  129/76, 133/78  Labs: BMET July 2021 - Scr 0.8, K 4.6, Na 141  Wt Readings from Last 3 Encounters:  06/03/20 193 lb 3.2 oz (87.6 kg)  03/31/20 192 lb (87.1 kg)  06/11/19 190 lb (86.2 kg)   BP Readings from Last 3 Encounters:  08/12/20 128/72  07/15/20 136/76  06/17/20 (!) 146/80   Pulse Readings from Last 3 Encounters:  08/12/20 68  07/15/20 81  06/17/20 72    Renal function: CrCl cannot be calculated (Patient's most recent lab result is older than the maximum 21 days allowed.).  Past Medical History:  Diagnosis Date  . Aortic atherosclerosis ([place]) 03/08/2017   CT  . Aortic stenosis    severe AS s/p TAVR (03/22/17) with a 82mm Edwards Sapien 3 THV via the TF approach  . Arthritis    "pain in my knees" (03/30/2016)  . Bilateral cataracts   . CAD S/P percutaneous coronary angioplasty 03/31/2016   a. CP/USA -> LHC 03/31/16: 90% prox-mid RCA s/p DES, 40% prox-mid LAD, 25% D2, 25% prox-mid Cx, elevated LVEDP 33.  . Chronic diastolic CHF (congestive heart failure) ([place]) 03/31/2016   pt unaware, has family history  . Chronic lower back pain   . DDD (degenerative disc disease), lumbar   . Dyslipidemia   . Grade II diastolic dysfunction   . Hearing loss   . Heart murmur   . Heartburn    occ. takes TUMS  . Humeral fracture 07/2014   Right  . Kidney cysts    1.6 cm simple cyst right  . LVH (left  ventricular hypertrophy)    a. severe basal septal hypertrophy by echo 03/2016.  . Mitral regurgitation    a. mild by echo 03/2016.  . Osteopenia 03/13/2009  . Psoriasis   . Rheumatic fever    as a child  . Scoliosis 03/13/2009  . 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 07/2015, absent seizure  . Sinus bradycardia - baseline HR 50s at times.   . 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 Thrombosed hemorrhoids     Current Outpatient Medications on File Prior to Visit  Medication Sig Dispense  Refill  . amLODipine (NORVASC) 10 MG tablet Take 1 tablet (10 mg total) by mouth daily. 90 tablet 3  . aspirin EC 81 MG tablet Take 81 mg by mouth daily. Swallow whole.    . calcium carbonate (TUMS - DOSED IN MG ELEMENTAL CALCIUM) 500 MG chewable tablet Chew 1 tablet by mouth daily as needed for heartburn.     . clobetasol (TEMOVATE) 0.05 % external solution Apply 1 application topically 2 (two) times daily as needed (for psoriasis).    . clobetasol ointment (TEMOVATE) 5.39 % Apply 1 application topically 2 (two) times daily as needed (for psoriasis).    . clonazePAM (KLONOPIN) 0.5 MG tablet Take 0.5 mg by mouth 2 (two) times daily as needed for anxiety.    Marland Kitchen desonide (DESOWEN) 0.05 % ointment Apply 1 application topically daily as needed (psoriasis).     . diphenhydrAMINE (BENADRYL) 25 mg capsule Take 25 mg by mouth daily as needed for allergies.    Marland Kitchen ezetimibe (ZETIA) 10 MG tablet TAKE 1 TABLET BY MOUTH DAILY 90 tablet 3  . HUMIRA PEN 40 MG/0.8ML PNKT Inject 40 mg into the skin See admin instructions. Inject 40 mg SQ every 6 weeks    . ibuprofen (ADVIL) 200 MG tablet Take 200 mg by mouth every 6 (six) hours as needed.    . levETIRAcetam (KEPPRA) 750 MG tablet Take 1 tablet (750 mg total) by mouth 2 (two) times daily. 180 tablet 3  . nitroGLYCERIN (NITROSTAT) 0.4 MG SL tablet DISSOLVE 1 TABLET UNDER THE TONGUE EVERY 5 MINUTES AS NEEDED FOR CHEST PAIN( UP TO 3 DOSES) 25 tablet 5  . PROCTOZONE-HC 2.5 % rectal cream Apply 1 application topically daily as needed for hemorrhoids or itching.     . rosuvastatin (CRESTOR) 40 MG tablet TAKE 1 TABLET(40 MG) BY MOUTH DAILY 90 tablet 3  . shark liver oil-cocoa butter (PREPARATION H) 0.25-3-85.5 % suppository Place 1 suppository rectally as needed for hemorrhoids.    . terbinafine (LAMISIL) 250 MG tablet Take 250 mg by mouth See admin instructions. Takes 1  Week out of each month     No current facility-administered medications on file prior to visit.     No Known Allergies   Assessment/Plan:  1. Hypertension - BP at goal <130/80 mmHg. Home BP readings significantly improved since increasing amlodipine to 10 mg daily. Medication adherence appears optimal with no side effects. Continued amlodipine 10 mg daily. Encouraged patient to continue checking BP at least 1 hour after taking medications. Encouraged patient to aim for a diet full of vegetables, fruit and lean meats (chicken, [place], fish) and to limit salt intake by eating fresh or frozen vegetables (instead of canned), rinse canned vegetables prior to cooking and do not add any additional salt to meals. Encouraged patient to exercise 20-30 minutes daily with  the goal of 150 minutes per week. Patient verbalized understanding. BP controlled and will follow-up as needed for any additional BP management.    Fabio Neighbors, PharmD, BCPS PGY2 Ambulatory Care Pharmacy Resident [facility] [address], [place], [place] [address] Phone: [phone]; Fax: [phone]

## 2020-08-12 ENCOUNTER — Encounter: Payer: Self-pay | Admitting: Pharmacist

## 2020-08-12 ENCOUNTER — Telehealth: Payer: Self-pay | Admitting: Internal Medicine

## 2020-08-12 ENCOUNTER — Ambulatory Visit (INDEPENDENT_AMBULATORY_CARE_PROVIDER_SITE_OTHER): Payer: Medicare PPO | Admitting: Pharmacist

## 2020-08-12 ENCOUNTER — Other Ambulatory Visit: Payer: Self-pay

## 2020-08-12 VITALS — BP 128/72 | HR 68

## 2020-08-12 DIAGNOSIS — I10 Essential (primary) hypertension: Secondary | ICD-10-CM

## 2020-08-12 NOTE — Telephone Encounter (Signed)
Study was ordered by Dr Karel Jarvis in Neurology and she had already asked her staff to notify patient study showed severe sleep apnea.  I have never sen him. Dr Karel Jarvis referred him for a Sleep Medicine consultation with Dr Wynona Neat in January.

## 2020-08-12 NOTE — Patient Instructions (Addendum)
It was nice to see you today!  Your goal blood pressure is <130/80 mmHg. In clinic, your blood pressure was 128/72 mmHg.  Medication Changes:  Continue amlodipine 10 mg daily  Monitor blood pressure at home daily and keep a log (on your phone or piece of paper) to bring with you to your next visit. Write down date, time, blood pressure and pulse.  Keep up the good work with diet and exercise. Aim for a diet full of vegetables, fruit and lean meats (chicken, [place], fish). Try to limit salt intake by eating fresh or frozen vegetables (instead of canned), rinse canned vegetables prior to cooking and do not add any additional salt to meals.   Please give [place] a call at [phone] with any questions or concerns.

## 2020-08-12 NOTE — Telephone Encounter (Signed)
CY please advise on results of HST.  Thanks

## 2020-08-13 NOTE — Telephone Encounter (Signed)
ATC patient, left detailed message (DPR) per for patient to return our call for any further questions.  Advised to contact Dr. Karel Jarvis (neurology) for the results.  Will await a return call.

## 2020-08-13 NOTE — Telephone Encounter (Signed)
Pt returning call from office . Please advise  

## 2020-08-25 DIAGNOSIS — I10 Essential (primary) hypertension: Secondary | ICD-10-CM

## 2020-08-25 MED ORDER — CHLORTHALIDONE 25 MG PO TABS: 12.5000 mg | ORAL_TABLET | Freq: Every day | ORAL | 11 refills | Status: DC

## 2020-08-27 DIAGNOSIS — D485 Neoplasm of uncertain behavior of skin: Secondary | ICD-10-CM | POA: Diagnosis not present

## 2020-08-27 DIAGNOSIS — F439 Reaction to severe stress, unspecified: Secondary | ICD-10-CM | POA: Diagnosis not present

## 2020-08-28 DIAGNOSIS — Z85828 Personal history of other malignant neoplasm of skin: Secondary | ICD-10-CM | POA: Diagnosis not present

## 2020-08-28 DIAGNOSIS — D485 Neoplasm of uncertain behavior of skin: Secondary | ICD-10-CM | POA: Diagnosis not present

## 2020-08-28 DIAGNOSIS — D2372 Other benign neoplasm of skin of left lower limb, including hip: Secondary | ICD-10-CM | POA: Diagnosis not present

## 2020-09-02 NOTE — Progress Notes (Signed)
Patient ID: Douglas Lane                 DOB: [birth year]                      MRN: [id number]     HPI: Douglas Lane a 75 y.o.malereferred by Dr. Carlye Grippe HTN clinic.PMH is significant for HLD, CAD s/p DES to RCA (03/2016)and 40% prox LAD and 50% ostial PDA,HTN, chronic diastolic CHF and severe AS s/p TAVR (03/22/17), EF 60-65% (2020), seizures.  Patient was last seen in pharmacy clinic on 08/12/2020. BP at clinic was at goal with marked improvement in home SBP. Pt did not report any side effects and reported adherence to current medication regimen. Patient was continued on amlodipine 10 mg daily. Pt was encouraged to continue healthy diet. On 08/25/2020, pt contacted clinic via [place] with complaints of swelling in ankles and feet.. Amlodipine was decreased to 5 mg daily and chlorthalidone 12.5 mg daily was started. However, due to challenges with cutting chlorthalidone in half, patient now taking chlorthalidone 25 mg daily.  Pt presents today for f/u visit to HTN clinic. He reports that the swelling in his ankle/feet has improved with mild/minimal swelling on exam. He reports that his blood pressure has looked similar since starting the chlorthalidone. He reports no side effects to chlorthalidone. Reports no missed doses of his medicines. Took chlorthalidone today at 8 am. He checks his BP at home in the evening, one hour after taking his BP medication. Pt brought in BP log to clinic today. Home BP ranging 120-130/66-81 with 1 BP reading elevated at 145/77. Today clinic BP is 128/72 mmHg.   Pt wants to discuss rationale behind chlorthalidone addition and the need for 2 blood pressure medications. He would like to decrease the amount of medicines he is taking but states that he feels unheard by providers. He mentions that his focus right now is being the caregiver for his wife who underwent knee replacement 2 weeks ago. He reports his exercise has decreased to no activity and states that he  knows he should exercise more. He reports that his PCP recently called him to review his medications with him but he is unsure why.   Current HTN meds: amlodipine 5 mg daily - evening chlorthalidone 25 mg daily - morning  Previously tried: amlodipine 10 mg daily - LEE  BP goal:<130/80 mmHg  Family History:Congestive Heart Failure in his father; Depression in his sister; Renal Disease in his mother  Social History:denies tobacco use;1cans of beerand1 shot of liquor(bourbon)perday   Diet: -Drinks2-3 cups ofcaffeine coffee daily, orange juice, lemonade; no carbonated drinks -uses salt substitute. Limits his salt to <1500 mg/day -cold cut sandwiches with [place], ham, or chicken, with whole wheat bread; red meat 1-2x/month -prefers fish or chicken -pasta, spaghetti, brown rice, beans, vegetables  -breakfast: piece of whole wheat bread with melted cheddar on top, cold cereal, oatmeal with dried cranberries -snacks: potatoe chips  Exercise:Currently no activity due to colder weather. He states he currently does not have motivation to exercise and prefers to nap instead. States that there is an exercise bike he can take advantage at home of - Used to go to [facility] prior to [place]; in the warmer weatherenjoys yard work - planting and [place] BP readings:  12/27-1/18: 133/98, 124/66, 120/67, 132/79, 129/73, 145/77, 139/81, 125/76, 125/76, 130/71, 129/74   Labs: BMET July 2021 - Scr 0.8, K 4.6, Na 141  Wt Readings  from Last 3 Encounters:  06/03/20 193 lb 3.2 oz (87.6 kg)  03/31/20 192 lb (87.1 kg)  06/11/19 190 lb (86.2 kg)   BP Readings from Last 3 Encounters:  08/12/20 128/72  07/15/20 136/76  06/17/20 (!) 146/80   Pulse Readings from Last 3 Encounters:  08/12/20 68  07/15/20 81  06/17/20 72    Renal function: CrCl cannot be calculated (Patient's most recent lab result is older than the maximum 21 days allowed.).  Past Medical History:  Diagnosis Date   . Aortic atherosclerosis ([place]) 03/08/2017   CT  . Aortic stenosis    severe AS s/p TAVR (03/22/17) with a 50mm Edwards Sapien 3 THV via the TF approach  . Arthritis    "pain in my knees" (03/30/2016)  . Bilateral cataracts   . CAD S/P percutaneous coronary angioplasty 03/31/2016   a. CP/USA -> LHC 03/31/16: 90% prox-mid RCA s/p DES, 40% prox-mid LAD, 25% D2, 25% prox-mid Cx, elevated LVEDP 33.  . Chronic diastolic CHF (congestive heart failure) ([place]) 03/31/2016   pt unaware, has family history  . Chronic lower back pain   . DDD (degenerative disc disease), lumbar   . Dyslipidemia   . Grade II diastolic dysfunction   . Hearing loss   . Heart murmur   . Heartburn    occ. takes TUMS  . Humeral fracture 07/2014   Right  . Kidney cysts    1.6 cm simple cyst right  . LVH (left ventricular hypertrophy)    a. severe basal septal hypertrophy by echo 03/2016.  . Mitral regurgitation    a. mild by echo 03/2016.  . Osteopenia 03/13/2009  . Psoriasis   . Rheumatic fever    as a child  . Scoliosis 03/13/2009  . Seizure ([place]) 01/2011; 07/2011; 07/2015   "idiopathic; been on Kepra since 07/2011", last seizure poss. 07/2015, absent seizure  . Sinus bradycardia - baseline HR 50s at times.   . 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 Thrombosed hemorrhoids     Current Outpatient Medications on File Prior to Visit  Medication Sig Dispense Refill  . amLODipine (NORVASC) 10 MG tablet Take 0.5 tablets (5 mg total) by mouth daily. 90 tablet 3  . aspirin EC 81 MG tablet Take 81 mg by mouth daily. Swallow whole.    . calcium carbonate (TUMS - DOSED IN MG ELEMENTAL CALCIUM) 500 MG chewable tablet Chew 1 tablet by mouth daily as needed for heartburn.     . chlorthalidone (HYGROTON) 25 MG tablet Take 0.5 tablets (12.5 mg total) by mouth daily. 15 tablet 11  . clobetasol (TEMOVATE) 0.05 % external solution Apply 1 application topically 2 (two) times daily as needed (for psoriasis).     . clobetasol ointment (TEMOVATE) 1.60 % Apply 1 application topically 2 (two) times daily as needed (for psoriasis).    . clonazePAM (KLONOPIN) 0.5 MG tablet Take 0.5 mg by mouth 2 (two) times daily as needed for anxiety.    Marland Kitchen desonide (DESOWEN) 0.05 % ointment Apply 1 application topically daily as needed (psoriasis).     . diphenhydrAMINE (BENADRYL) 25 mg capsule Take 25 mg by mouth daily as needed for allergies.    Marland Kitchen ezetimibe (ZETIA) 10 MG tablet TAKE 1 TABLET BY MOUTH DAILY 90 tablet 3  . HUMIRA PEN 40 MG/0.8ML PNKT Inject 40 mg into the skin See admin instructions. Inject 40 mg SQ every 6 weeks    . ibuprofen (ADVIL)  200 MG tablet Take 200 mg by mouth every 6 (six) hours as needed.    . levETIRAcetam (KEPPRA) 750 MG tablet Take 1 tablet (750 mg total) by mouth 2 (two) times daily. 180 tablet 3  . nitroGLYCERIN (NITROSTAT) 0.4 MG SL tablet DISSOLVE 1 TABLET UNDER THE TONGUE EVERY 5 MINUTES AS NEEDED FOR CHEST PAIN( UP TO 3 DOSES) 25 tablet 5  . PROCTOZONE-HC 2.5 % rectal cream Apply 1 application topically daily as needed for hemorrhoids or itching.     . rosuvastatin (CRESTOR) 40 MG tablet TAKE 1 TABLET(40 MG) BY MOUTH DAILY 90 tablet 3  . shark liver oil-cocoa butter (PREPARATION H) 0.25-3-85.5 % suppository Place 1 suppository rectally as needed for hemorrhoids.    . terbinafine (LAMISIL) 250 MG tablet Take 250 mg by mouth See admin instructions. Takes 1  Week out of each month     No current facility-administered medications on file prior to visit.    No Known Allergies   Assessment/Plan:  1. Hypertension - BP 128/72 mmHg in clinic today at goal of <130/80 mmHg. Home BP readings also at goal with 1 elevated reading. Lower extremity edema improved with amlodipine decrease and chlorthalidone addition. Continue amlodipine 5 mg daily and chlorthalidone 25 mg daily. Will check BMET today. Discussed with patient rationale of chlorthalidone addition and that it helps with swelling and BP  lowering. Discussed with patient importance of blood pressure control to prevent cardiovascular complications given his history. Encouraged patient to continue with low salt diet and goal of <2000 mg/day. Encouraged patient to start re-introduce exercise by slowly increasing to 5 times a week. Pt to f/u with HTN clinic if BP consistently above 130/80 mmHg or if swelling becomes painful or not improving.   Juluis Pitch, PharmD Candidate  Ramond Dial, [place].D, BCPS, CPP [place]  [id number], [place], [place] [address]  Phone: [phone]; Fax: [phone]

## 2020-09-03 ENCOUNTER — Other Ambulatory Visit: Payer: Self-pay

## 2020-09-03 ENCOUNTER — Ambulatory Visit (INDEPENDENT_AMBULATORY_CARE_PROVIDER_SITE_OTHER): Payer: Medicare PPO | Admitting: Pharmacist

## 2020-09-03 VITALS — BP 128/72 | HR 73

## 2020-09-03 DIAGNOSIS — I10 Essential (primary) hypertension: Secondary | ICD-10-CM

## 2020-09-03 MED ORDER — CHLORTHALIDONE 25 MG PO TABS: 25.0000 mg | ORAL_TABLET | Freq: Every day | ORAL | 3 refills | Status: DC

## 2020-09-03 NOTE — Patient Instructions (Signed)
It was great to see you today!  Your blood pressure was great today! Your blood pressure goal is less than 130/80 mmHg.  Continue taking your amlodipine 1 tablet daily (5 mg) and your chlorthalidone 1 tablet daily (25 mg).   We will check your electrolytes today.  Give [place] a call if your blood pressure is over 130/80 mmHg or if you have any other questions

## 2020-09-04 ENCOUNTER — Telehealth: Payer: Self-pay

## 2020-09-04 LAB — BASIC METABOLIC PANEL
BUN/Creatinine Ratio: 17 (ref 10–24)
BUN: 17 mg/dL (ref 8–27)
CO2: 29 mmol/L (ref 20–29)
Calcium: 9.5 mg/dL (ref 8.6–10.2)
Chloride: 97 mmol/L (ref 96–106)
Creatinine, Ser: 1.02 mg/dL (ref 0.76–1.27)
GFR calc Af Amer: 82 mL/min/{1.73_m2} (ref >59)
GFR calc non Af Amer: 71 mL/min/{1.73_m2} (ref >59)
Glucose: 101 mg/dL — ABNORMAL HIGH (ref 65–99)
Potassium: 3.5 mmol/L (ref 3.5–5.2)
Sodium: 138 mmol/L (ref 134–144)

## 2020-09-04 MED ORDER — POTASSIUM CHLORIDE ER 20 MEQ PO TBCR: 1.0000 | EXTENDED_RELEASE_TABLET | Freq: Every day | ORAL | 11 refills | Status: DC

## 2020-09-04 NOTE — Telephone Encounter (Signed)
Called pt to discuss Dr. Theodosia Blender recommendation to add Kdur 20 mEq daily and check BMET in 1 week. Pt not thrilled to add new medication. Counseled pt on increasing potassium through diet by eating more potassium rich foods (banana, kale, tomatoes). Pt okay would like to proceed with Kdur 20 mEq daily. Rx sent to patient pharmacy. Pt will contact via MyChart to schedule BMET appointment next week.   Juluis Pitch, PharmD Candidate

## 2020-09-05 ENCOUNTER — Other Ambulatory Visit: Payer: Self-pay | Admitting: Cardiology

## 2020-09-08 ENCOUNTER — Other Ambulatory Visit: Payer: Self-pay

## 2020-09-08 DIAGNOSIS — I10 Essential (primary) hypertension: Secondary | ICD-10-CM

## 2020-09-12 ENCOUNTER — Other Ambulatory Visit: Payer: Medicare PPO | Admitting: *Deleted

## 2020-09-12 ENCOUNTER — Other Ambulatory Visit: Payer: Self-pay

## 2020-09-12 ENCOUNTER — Ambulatory Visit: Payer: Medicare PPO | Admitting: Pulmonary Disease

## 2020-09-12 ENCOUNTER — Encounter: Payer: Self-pay | Admitting: Pulmonary Disease

## 2020-09-12 VITALS — BP 120/60 | HR 80 | Temp 97.3°F | Ht 65.0 in | Wt 195.4 lb

## 2020-09-12 DIAGNOSIS — Z23 Encounter for immunization: Secondary | ICD-10-CM | POA: Diagnosis not present

## 2020-09-12 DIAGNOSIS — G4733 Obstructive sleep apnea (adult) (pediatric): Secondary | ICD-10-CM | POA: Diagnosis not present

## 2020-09-12 DIAGNOSIS — I10 Essential (primary) hypertension: Secondary | ICD-10-CM

## 2020-09-12 NOTE — Progress Notes (Signed)
Douglas Lane    [id number]    [birth year]  Primary Care Physician:Ross, Dwyane Luo, MD  Referring Physician: Cameron Sprang, MD [place],  [place] [address]  Chief complaint:   Patient being seen for obstructive sleep apnea  HPI:  Recently had a home sleep study performed revealing severe obstructive sleep apnea  Has been told about snoring, no witnessed apneas Occasional dryness of his mouth in the mornings No morning headaches Feels his memory is fine  No family history of obstructive sleep apnea  Usually goes to bed between 1030 and 1130 Falls asleep easily 3-4 awakenings  Final wake up for between 8 and 9 AM  Weight is up about 20 pounds  Has a history of coronary artery disease status post stenting in the past Has hypertension  Outpatient Encounter Medications as of 09/12/2020  Medication Sig  . amLODipine (NORVASC) 10 MG tablet Take 0.5 tablets (5 mg total) by mouth daily.  Marland Kitchen aspirin EC 81 MG tablet Take 81 mg by mouth daily. Swallow whole.  . calcium carbonate (TUMS - DOSED IN MG ELEMENTAL CALCIUM) 500 MG chewable tablet Chew 1 tablet by mouth daily as needed for heartburn.   . chlorthalidone (HYGROTON) 25 MG tablet Take 1 tablet (25 mg total) by mouth daily.  . clobetasol (TEMOVATE) 0.05 % external solution Apply 1 application topically 2 (two) times daily as needed (for psoriasis).  . clobetasol ointment (TEMOVATE) 7.32 % Apply 1 application topically 2 (two) times daily as needed (for psoriasis).  . clonazePAM (KLONOPIN) 0.5 MG tablet Take 0.5 mg by mouth 2 (two) times daily as needed for anxiety.  Marland Kitchen desonide (DESOWEN) 0.05 % ointment Apply 1 application topically daily as needed (psoriasis).   . diphenhydrAMINE (BENADRYL) 25 mg capsule Take 25 mg by mouth daily as needed for allergies.  Marland Kitchen ezetimibe (ZETIA) 10 MG tablet TAKE 1 TABLET BY MOUTH DAILY  . HUMIRA PEN 40 MG/0.8ML PNKT Inject 40 mg into the skin See admin  instructions. Inject 40 mg SQ every 6 weeks  . ibuprofen (ADVIL) 200 MG tablet Take 200 mg by mouth every 6 (six) hours as needed.  . levETIRAcetam (KEPPRA) 750 MG tablet Take 1 tablet (750 mg total) by mouth 2 (two) times daily.  . Potassium Chloride ER 20 MEQ TBCR Take 1 tablet by mouth daily at 6 (six) AM.  . PROCTOZONE-HC 2.5 % rectal cream Apply 1 application topically daily as needed for hemorrhoids or itching.   . rosuvastatin (CRESTOR) 40 MG tablet TAKE 1 TABLET(40 MG) BY MOUTH DAILY  . shark liver oil-cocoa butter (PREPARATION H) 0.25-3-85.5 % suppository Place 1 suppository rectally as needed for hemorrhoids.  . terbinafine (LAMISIL) 250 MG tablet Take 250 mg by mouth See admin instructions. Takes 1  Week out of each month  . nitroGLYCERIN (NITROSTAT) 0.4 MG SL tablet DISSOLVE 1 TABLET UNDER THE TONGUE EVERY 5 MINUTES AS NEEDED FOR CHEST PAIN( UP TO 3 DOSES)   No facility-administered encounter medications on file as of 09/12/2020.    Allergies as of 09/12/2020  . (No Known Allergies)    Past Medical History:  Diagnosis Date  . Aortic atherosclerosis ([place]) 03/08/2017   CT  . Aortic stenosis    severe AS s/p TAVR (03/22/17) with a 63mm Edwards Sapien 3 THV via the TF approach  . Arthritis    "pain in my knees" (03/30/2016)  . Bilateral cataracts   . CAD  S/P percutaneous coronary angioplasty 03/31/2016   a. CP/USA -> LHC 03/31/16: 90% prox-mid RCA s/p DES, 40% prox-mid LAD, 25% D2, 25% prox-mid Cx, elevated LVEDP 33.  . Chronic diastolic CHF (congestive heart failure) ([place]) 03/31/2016   pt unaware, has family history  . Chronic lower back pain   . DDD (degenerative disc disease), lumbar   . Dyslipidemia   . Grade II diastolic dysfunction   . Hearing loss   . Heart murmur   . Heartburn    occ. takes TUMS  . Humeral fracture 07/2014   Right  . Kidney cysts    1.6 cm simple cyst right  . LVH (left ventricular hypertrophy)    a. severe basal septal hypertrophy by echo  03/2016.  . Mitral regurgitation    a. mild by echo 03/2016.  . Osteopenia 03/13/2009  . Psoriasis   . Rheumatic fever    as a child  . Scoliosis 03/13/2009  . 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 07/2015, absent seizure  . Sinus bradycardia - baseline HR 50s at times.   . 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 Thrombosed hemorrhoids     Past Surgical History:  Procedure Laterality Date  . APPENDECTOMY    . BACK SURGERY    . CARDIAC CATHETERIZATION N/A 03/30/2016   Procedure: Right/Left Heart Cath and Coronary Angiography;  Surgeon: Jettie Booze, MD;  Location: [facility];  Service: Cardiovascular;  Laterality: N/A;  . CARDIAC CATHETERIZATION N/A 03/30/2016   Procedure: Coronary Stent Intervention;  Surgeon: Jettie Booze, MD;  Location: [facility];  Service: Cardiovascular;  Laterality: N/A;  . CATARACT EXTRACTION, BILATERAL    . COLONOSCOPY    . CORONARY ANGIOPLASTY    . FOREARM FRACTURE SURGERY Right ~ 2003-2004   "got steel plate in there"  . HERNIA REPAIR  2009  . I&D hemorrhoirds    . KNEE ARTHROSCOPY Bilateral   . MOHS SURGERY Left    temple  . ORIF SCAPULAR FRACTURE Right    right  . POSTERIOR LUMBAR FUSION  2004  . TEE WITHOUT CARDIOVERSION N/A 11/01/2016   Procedure: TRANSESOPHAGEAL ECHOCARDIOGRAM (TEE);  Surgeon: Sanda Klein, MD;  Location: [facility];  Service: Cardiovascular;  Laterality: N/A;  . TEE WITHOUT CARDIOVERSION N/A 03/22/2017   Procedure: TRANSESOPHAGEAL ECHOCARDIOGRAM (TEE);  Surgeon: Sherren Mocha, MD;  Location: [facility];  Service: Open Heart Surgery;  Laterality: N/A;  . TONSILLECTOMY    . TOTAL KNEE ARTHROPLASTY Right 07/24/2018   Procedure: RIGHT TOTAL KNEE ARTHROPLASTY;  Surgeon: Gaynelle Arabian, MD;  Location: [facility];  Service: Orthopedics;  Laterality: Right;  86min  . TOTAL KNEE ARTHROPLASTY Left 06/11/2019   Procedure: TOTAL  KNEE ARTHROPLASTY;  Surgeon: Gaynelle Arabian, MD;  Location: [facility];  Service: Orthopedics;  Laterality: Left;  66min  . TRANSCATHETER AORTIC VALVE REPLACEMENT, TRANSFEMORAL N/A 03/22/2017   Procedure: TRANSCATHETER AORTIC VALVE REPLACEMENT, TRANSFEMORAL;  Surgeon: Sherren Mocha, MD;  Location: [facility];  Service: Open Heart Surgery;  Laterality: N/A;    Family History  Problem Relation Age of Onset  . Renal Disease Mother   . Congestive Heart Failure Father   . Depression Sister     Social History   Socioeconomic History  . Marital status: Married    Spouse name: Not on file  . Number of children: 1  . Years of education: Not on file  .  Highest education level: Not on file  Occupational History  . Not on file  Tobacco Use  . Smoking status: Never Smoker  . Smokeless tobacco: Never Used  Vaping Use  . Vaping Use: Never used  Substance and Sexual Activity  . Alcohol use: Yes    Alcohol/week: 2.0 standard drinks    Types: 1 Cans of beer, 1 Shots of liquor per week    Comment: daily  . Drug use: No  . Sexual activity: Not Currently  Other Topics Concern  . Not on file  Social History Narrative   Lives with wife and son in two story home      Right handed      Master's degree      Drinks caffeine (Coffee) daily   Social Determinants of Health   Financial Resource Strain: Not on file  Food Insecurity: Not on file  Transportation Needs: Not on file  Physical Activity: Not on file  Stress: Not on file  Social Connections: Not on file  Intimate Partner Violence: Not on file    Review of Systems  Constitutional: Positive for fatigue.  Respiratory: Negative for shortness of breath.   Psychiatric/Behavioral: Positive for sleep disturbance.    Vitals:   09/12/20 1434  BP: 120/60  Pulse: 80  Temp: (!) 97.3 F (36.3 C)  SpO2: 93%     Physical Exam Constitutional:      Appearance: He is obese.  HENT:     Head: Normocephalic and atraumatic.     Mouth/Throat:      Mouth: Mucous membranes are moist.  Cardiovascular:     Rate and Rhythm: Normal rate and regular rhythm.     Heart sounds: No murmur heard. No friction rub.  Pulmonary:     Effort: No respiratory distress.     Breath sounds: No stridor. No wheezing or rhonchi.  Neurological:     Mental Status: He is alert.  Psychiatric:        Mood and Affect: Mood normal.    Data Reviewed: Home sleep study reviewed showing   Assessment:  Severe obstructive sleep apnea  Again discussed with the patient Treatment options for sleep disordered breathing discussed with the patient  Plan/Recommendations: I did encourage him to seek more information and provided him with patient information regarding obstructive sleep apnea  We will make a DME referral for initiation of CPAP  His sleep apnea is severe, does have background history of coronary artery disease Significant risk of cardiac morbidity if sleep apnea is not treated  I will follow-up with him following initiation of treatment   Sherrilyn Rist MD [facility] 09/12/2020, 3:01 PM  CC: Cameron Sprang, MD

## 2020-09-12 NOTE — Patient Instructions (Signed)
Your study did reveal severe obstructive sleep apnea  CPAP therapy will be most appropriate  We will make referral to the medical supply company to start you on CPAP Takes a few weeks to get you set up  I will see you back here in about 4 to 6 weeks following initiation of treatment   Sleep Apnea Sleep apnea affects breathing during sleep. It causes breathing to stop for a short time or to become shallow. It can also increase the risk of:  Heart attack.  Stroke.  Being very overweight (obese).  Diabetes.  Heart failure.  Irregular heartbeat. The goal of treatment is to help you breathe normally again. What are the causes? There are three kinds of sleep apnea:  Obstructive sleep apnea. This is caused by a blocked or collapsed airway.  Central sleep apnea. This happens when the brain does not send the right signals to the muscles that control breathing.  Mixed sleep apnea. This is a combination of obstructive and central sleep apnea. The most common cause of this condition is a collapsed or blocked airway. This can happen if:  Your throat muscles are too relaxed.  Your tongue and tonsils are too large.  You are overweight.  Your airway is too small.   What increases the risk?  Being overweight.  Smoking.  Having a small airway.  Being older.  Being male.  Drinking alcohol.  Taking medicines to calm yourself (sedatives or tranquilizers).  Having family members with the condition. What are the signs or symptoms?  Trouble staying asleep.  Being sleepy or tired during the day.  Getting angry a lot.  Loud snoring.  Headaches in the morning.  Not being able to focus your mind (concentrate).  Forgetting things.  Less interest in sex.  Mood swings.  Personality changes.  Feelings of sadness (depression).  Waking up a lot during the night to pee (urinate).  Dry mouth.  Sore throat. How is this diagnosed?  Your medical history.  A  physical exam.  A test that is done when you are sleeping (sleep study). The test is most often done in a sleep lab but may also be done at home. How is this treated?  Sleeping on your side.  Using a medicine to get rid of mucus in your nose (decongestant).  Avoiding the use of alcohol, medicines to help you relax, or certain pain medicines (narcotics).  Losing weight, if needed.  Changing your diet.  Not smoking.  Using a machine to open your airway while you sleep, such as: ? An oral appliance. This is a mouthpiece that shifts your lower jaw forward. ? A CPAP device. This device blows air through a mask when you breathe out (exhale). ? An EPAP device. This has valves that you put in each nostril. ? A BPAP device. This device blows air through a mask when you breathe in (inhale) and breathe out.  Having surgery if other treatments do not work. It is important to get treatment for sleep apnea. Without treatment, it can lead to:  High blood pressure.  Coronary artery disease.  In men, not being able to have an erection (impotence).  Reduced thinking ability.   Follow these instructions at home: Lifestyle  Make changes that your doctor recommends.  Eat a healthy diet.  Lose weight if needed.  Avoid alcohol, medicines to help you relax, and some pain medicines.  Do not use any products that contain nicotine or tobacco, such as cigarettes, e-cigarettes, and  chewing tobacco. If you need help quitting, ask your doctor. General instructions  Take over-the-counter and prescription medicines only as told by your doctor.  If you were given a machine to use while you sleep, use it only as told by your doctor.  If you are having surgery, make sure to tell your doctor you have sleep apnea. You may need to bring your device with you.  Keep all follow-up visits as told by your doctor. This is important. Contact a doctor if:  The machine that you were given to use during sleep  bothers you or does not seem to be working.  You do not get better.  You get worse. Get help right away if:  Your chest hurts.  You have trouble breathing in enough air.  You have an uncomfortable feeling in your back, arms, or stomach.  You have trouble talking.  One side of your body feels weak.  A part of your face is hanging down. These symptoms may be an emergency. Do not wait to see if the symptoms will go away. Get medical help right away. Call your local emergency services (911 in the [place] Do not drive yourself to the hospital. Summary  This condition affects breathing during sleep.  The most common cause is a collapsed or blocked airway.  The goal of treatment is to help you breathe normally while you sleep. This information is not intended to replace advice given to you by your health care provider. Make sure you discuss any questions you have with your health care provider. Document Revised: 05/19/2018 Document Reviewed: 03/28/2018 Elsevier Patient Education  [place].

## 2020-09-12 NOTE — Addendum Note (Signed)
Addended by: Lia Foyer R on: 09/12/2020 05:10 PM   Modules accepted: Orders

## 2020-09-13 LAB — BASIC METABOLIC PANEL
BUN/Creatinine Ratio: 18 (ref 10–24)
BUN: 17 mg/dL (ref 8–27)
CO2: 27 mmol/L (ref 20–29)
Calcium: 9.3 mg/dL (ref 8.6–10.2)
Chloride: 100 mmol/L (ref 96–106)
Creatinine, Ser: 0.97 mg/dL (ref 0.76–1.27)
GFR calc Af Amer: 87 mL/min/{1.73_m2} (ref >59)
GFR calc non Af Amer: 76 mL/min/{1.73_m2} (ref >59)
Glucose: 105 mg/dL — ABNORMAL HIGH (ref 65–99)
Potassium: 3.8 mmol/L (ref 3.5–5.2)
Sodium: 141 mmol/L (ref 134–144)

## 2020-09-26 ENCOUNTER — Other Ambulatory Visit: Payer: Self-pay | Admitting: Cardiology

## 2020-10-24 ENCOUNTER — Other Ambulatory Visit: Payer: Self-pay | Admitting: Cardiology

## 2020-10-24 DIAGNOSIS — I10 Essential (primary) hypertension: Secondary | ICD-10-CM

## 2020-10-28 DIAGNOSIS — N401 Enlarged prostate with lower urinary tract symptoms: Secondary | ICD-10-CM | POA: Diagnosis not present

## 2020-11-04 DIAGNOSIS — R972 Elevated prostate specific antigen [PSA]: Secondary | ICD-10-CM | POA: Diagnosis not present

## 2020-11-04 DIAGNOSIS — R351 Nocturia: Secondary | ICD-10-CM | POA: Diagnosis not present

## 2020-11-04 DIAGNOSIS — N401 Enlarged prostate with lower urinary tract symptoms: Secondary | ICD-10-CM | POA: Diagnosis not present

## 2020-12-02 ENCOUNTER — Other Ambulatory Visit: Payer: Self-pay | Admitting: Pharmacist

## 2020-12-02 MED ORDER — POTASSIUM CHLORIDE ER 10 MEQ PO TBCR: 10.0000 meq | EXTENDED_RELEASE_TABLET | Freq: Two times a day (BID) | ORAL | 11 refills | Status: DC

## 2020-12-15 DIAGNOSIS — R058 Other specified cough: Secondary | ICD-10-CM | POA: Diagnosis not present

## 2020-12-15 DIAGNOSIS — R0981 Nasal congestion: Secondary | ICD-10-CM | POA: Diagnosis not present

## 2020-12-15 DIAGNOSIS — U071 COVID-19: Secondary | ICD-10-CM | POA: Diagnosis not present

## 2021-03-04 ENCOUNTER — Other Ambulatory Visit: Payer: Self-pay | Admitting: Neurology

## 2021-03-04 DIAGNOSIS — G40009 Localization-related (focal) (partial) idiopathic epilepsy and epileptic syndromes with seizures of localized onset, not intractable, without status epilepticus: Secondary | ICD-10-CM

## 2021-03-23 DIAGNOSIS — R21 Rash and other nonspecific skin eruption: Secondary | ICD-10-CM | POA: Diagnosis not present

## 2021-03-25 DIAGNOSIS — L570 Actinic keratosis: Secondary | ICD-10-CM | POA: Diagnosis not present

## 2021-03-25 DIAGNOSIS — L821 Other seborrheic keratosis: Secondary | ICD-10-CM | POA: Diagnosis not present

## 2021-03-25 DIAGNOSIS — Z85828 Personal history of other malignant neoplasm of skin: Secondary | ICD-10-CM | POA: Diagnosis not present

## 2021-03-25 DIAGNOSIS — L820 Inflamed seborrheic keratosis: Secondary | ICD-10-CM | POA: Diagnosis not present

## 2021-03-25 DIAGNOSIS — D1801 Hemangioma of skin and subcutaneous tissue: Secondary | ICD-10-CM | POA: Diagnosis not present

## 2021-03-25 DIAGNOSIS — C44729 Squamous cell carcinoma of skin of left lower limb, including hip: Secondary | ICD-10-CM | POA: Diagnosis not present

## 2021-03-25 DIAGNOSIS — D485 Neoplasm of uncertain behavior of skin: Secondary | ICD-10-CM | POA: Diagnosis not present

## 2021-03-25 DIAGNOSIS — L400 Psoriasis vulgaris: Secondary | ICD-10-CM | POA: Diagnosis not present

## 2021-03-31 ENCOUNTER — Ambulatory Visit: Payer: Medicare PPO | Admitting: Neurology

## 2021-04-10 DIAGNOSIS — G4700 Insomnia, unspecified: Secondary | ICD-10-CM | POA: Diagnosis not present

## 2021-04-10 DIAGNOSIS — M25572 Pain in left ankle and joints of left foot: Secondary | ICD-10-CM | POA: Diagnosis not present

## 2021-04-10 DIAGNOSIS — E7800 Pure hypercholesterolemia, unspecified: Secondary | ICD-10-CM | POA: Diagnosis not present

## 2021-04-10 DIAGNOSIS — F439 Reaction to severe stress, unspecified: Secondary | ICD-10-CM | POA: Diagnosis not present

## 2021-04-10 DIAGNOSIS — Z79899 Other long term (current) drug therapy: Secondary | ICD-10-CM | POA: Diagnosis not present

## 2021-04-10 DIAGNOSIS — R799 Abnormal finding of blood chemistry, unspecified: Secondary | ICD-10-CM | POA: Diagnosis not present

## 2021-04-10 DIAGNOSIS — Z0000 Encounter for general adult medical examination without abnormal findings: Secondary | ICD-10-CM | POA: Diagnosis not present

## 2021-04-10 DIAGNOSIS — M179 Osteoarthritis of knee, unspecified: Secondary | ICD-10-CM | POA: Diagnosis not present

## 2021-04-14 DIAGNOSIS — G4733 Obstructive sleep apnea (adult) (pediatric): Secondary | ICD-10-CM | POA: Diagnosis not present

## 2021-04-16 ENCOUNTER — Encounter (INDEPENDENT_AMBULATORY_CARE_PROVIDER_SITE_OTHER): Payer: Self-pay

## 2021-04-16 DIAGNOSIS — E7800 Pure hypercholesterolemia, unspecified: Secondary | ICD-10-CM | POA: Diagnosis not present

## 2021-04-16 DIAGNOSIS — H02834 Dermatochalasis of left upper eyelid: Secondary | ICD-10-CM | POA: Diagnosis not present

## 2021-04-16 DIAGNOSIS — H0102A Squamous blepharitis right eye, upper and lower eyelids: Secondary | ICD-10-CM | POA: Diagnosis not present

## 2021-04-16 DIAGNOSIS — H0102B Squamous blepharitis left eye, upper and lower eyelids: Secondary | ICD-10-CM | POA: Diagnosis not present

## 2021-04-16 DIAGNOSIS — Z961 Presence of intraocular lens: Secondary | ICD-10-CM | POA: Diagnosis not present

## 2021-04-16 DIAGNOSIS — D3132 Benign neoplasm of left choroid: Secondary | ICD-10-CM | POA: Diagnosis not present

## 2021-04-16 DIAGNOSIS — H02831 Dermatochalasis of right upper eyelid: Secondary | ICD-10-CM | POA: Diagnosis not present

## 2021-04-16 DIAGNOSIS — R799 Abnormal finding of blood chemistry, unspecified: Secondary | ICD-10-CM | POA: Diagnosis not present

## 2021-04-16 DIAGNOSIS — Z79899 Other long term (current) drug therapy: Secondary | ICD-10-CM | POA: Diagnosis not present

## 2021-04-23 ENCOUNTER — Other Ambulatory Visit: Payer: Self-pay | Admitting: Cardiology

## 2021-04-23 DIAGNOSIS — I10 Essential (primary) hypertension: Secondary | ICD-10-CM

## 2021-04-27 ENCOUNTER — Other Ambulatory Visit: Payer: Self-pay

## 2021-04-27 ENCOUNTER — Encounter (INDEPENDENT_AMBULATORY_CARE_PROVIDER_SITE_OTHER): Payer: Self-pay | Admitting: Ophthalmology

## 2021-04-27 ENCOUNTER — Ambulatory Visit (INDEPENDENT_AMBULATORY_CARE_PROVIDER_SITE_OTHER): Payer: Medicare PPO | Admitting: Ophthalmology

## 2021-04-27 DIAGNOSIS — Z961 Presence of intraocular lens: Secondary | ICD-10-CM

## 2021-04-27 DIAGNOSIS — D3132 Benign neoplasm of left choroid: Secondary | ICD-10-CM | POA: Insufficient documentation

## 2021-04-27 DIAGNOSIS — H26493 Other secondary cataract, bilateral: Secondary | ICD-10-CM | POA: Diagnosis not present

## 2021-04-27 NOTE — Assessment & Plan Note (Signed)
Stable OU 

## 2021-04-27 NOTE — Progress Notes (Signed)
04/27/2021     CHIEF COMPLAINT Patient presents for No chief complaint on file.   See HPI below as computer software will not put chief complaint here  HISTORY OF PRESENT ILLNESS: Douglas Lane is a 75 y.o. male who presents to the clinic today for:   HPI   NP large nevus os oct fp referred by Dr. Katy Fitch. Pt states "last year sometime, the PA at Dr. Patrici Ranks office noticed something on my left retina, sent met to Dr. Manuella Ghazi at [facility] to measure the nevus accurately but I did not have a good experience there so I did not want to go back there. Then I went back to see Dr. Katy Fitch last week for a new prescription and sent me here. Dr. Katy Fitch said this needs to be followed in case it becomes cancerous."  Pt states he has floaters and flashes of light, longstanding and stable, no new onset. Last edited by Laurin Coder on 04/27/2021  9:15 AM.      Referring physician: Lawerance Cruel, MD [place],  [place] [id number]  HISTORICAL INFORMATION:   Selected notes from the MEDICAL RECORD NUMBER    Lab Results  Component Value Date   HGBA1C 5.5 03/17/2017     CURRENT MEDICATIONS: No current outpatient medications on file. (Ophthalmic Drugs)   No current facility-administered medications for this visit. (Ophthalmic Drugs)   Current Outpatient Medications (Other)  Medication Sig   amLODipine (NORVASC) 10 MG tablet Take 0.5 tablets (5 mg total) by mouth daily.   amLODipine (NORVASC) 5 MG tablet TAKE 1 TABLET(5 MG) BY MOUTH DAILY   aspirin EC 81 MG tablet Take 81 mg by mouth daily. Swallow whole.   calcium carbonate (TUMS - DOSED IN MG ELEMENTAL CALCIUM) 500 MG chewable tablet Chew 1 tablet by mouth daily as needed for heartburn.    chlorthalidone (HYGROTON) 25 MG tablet Take 1 tablet (25 mg total) by mouth daily.   clobetasol (TEMOVATE) 0.05 % external solution Apply 1 application topically 2 (two) times daily as needed (for psoriasis).   clobetasol ointment  (TEMOVATE) 8.88 % Apply 1 application topically 2 (two) times daily as needed (for psoriasis).   clonazePAM (KLONOPIN) 0.5 MG tablet Take 0.5 mg by mouth 2 (two) times daily as needed for anxiety.   desonide (DESOWEN) 0.05 % ointment Apply 1 application topically daily as needed (psoriasis).    diphenhydrAMINE (BENADRYL) 25 mg capsule Take 25 mg by mouth daily as needed for allergies.   ezetimibe (ZETIA) 10 MG tablet TAKE 1 TABLET BY MOUTH DAILY   HUMIRA PEN 40 MG/0.8ML PNKT Inject 40 mg into the skin See admin instructions. Inject 40 mg SQ every 6 weeks   ibuprofen (ADVIL) 200 MG tablet Take 200 mg by mouth every 6 (six) hours as needed.   levETIRAcetam (KEPPRA) 750 MG tablet TAKE 1 TABLET(750 MG) BY MOUTH TWICE DAILY   nitroGLYCERIN (NITROSTAT) 0.4 MG SL tablet DISSOLVE 1 TABLET UNDER THE TONGUE EVERY 5 MINUTES AS NEEDED FOR CHEST PAIN( UP TO 3 DOSES)   potassium chloride (KLOR-CON) 10 MEQ tablet Take 1 tablet (10 mEq total) by mouth 2 (two) times daily.   PROCTOZONE-HC 2.5 % rectal cream Apply 1 application topically daily as needed for hemorrhoids or itching.    rosuvastatin (CRESTOR) 40 MG tablet TAKE 1 TABLET(40 MG) BY MOUTH DAILY   shark liver oil-cocoa butter (PREPARATION H) 0.25-3-85.5 % suppository Place 1 suppository rectally as needed for hemorrhoids.  terbinafine (LAMISIL) 250 MG tablet Take 250 mg by mouth See admin instructions. Takes 1  Week out of each month   No current facility-administered medications for this visit. (Other)      REVIEW OF SYSTEMS:    ALLERGIES No Known Allergies  PAST MEDICAL HISTORY Past Medical History:  Diagnosis Date   Aortic atherosclerosis ([place]) 03/08/2017   CT   Aortic stenosis    severe AS s/p TAVR (03/22/17) with a 31m Edwards Sapien 3 THV via the TF approach   Arthritis    "pain in my knees" (03/30/2016)   Bilateral cataracts    CAD S/P percutaneous coronary angioplasty 03/31/2016   a. CP/USA -> LHC 03/31/16: 90% prox-mid RCA s/p  DES, 40% prox-mid LAD, 25% D2, 25% prox-mid Cx, elevated LVEDP 33.   Chronic diastolic CHF ([place]estive heart failure) [place] 03/31/2016   pt unaware, has family history   Chronic lower back pain    DDD (degenerative disc disease), lumbar    Dyslipidemia    Grade II diastolic dysfunction    Hearing loss    Heart murmur    Heartburn    occ. takes TUMS   Humeral fracture 07/2014   Right   Kidney cysts    1.6 cm simple cyst right   LVH (left ventricular hypertrophy)    a. severe basal septal hypertrophy by echo 03/2016.   Mitral regurgitation    a. mild by echo 03/2016.   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 07/2015, absent seizure   Sinus bradycardia - baseline HR 50s at times.    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    Thrombosed hemorrhoids    Past Surgical History:  Procedure Laterality Date   APPENDECTOMY     BACK SURGERY     CARDIAC CATHETERIZATION N/A 03/30/2016   Procedure: Right/Left Heart Cath and Coronary Angiography;  Surgeon: JJettie Booze MD;  [facility];  Service: Cardiovascular;  Laterality: N/A;   CARDIAC CATHETERIZATION N/A 03/30/2016   Procedure: Coronary Stent Intervention;  Surgeon: JJettie Booze MD;  [facility];  Service: Cardiovascular;  Laterality: N/A;   CATARACT EXTRACTION, BILATERAL     COLONOSCOPY     CORONARY ANGIOPLASTY     FOREARM FRACTURE SURGERY Right ~ 2003-2004   "got steel plate in there"   HERNIA REPAIR  2009   I&D hemorrhoirds     KNEE ARTHROSCOPY Bilateral    MOHS SURGERY Left    temple   ORIF SCAPULAR FRACTURE Right    right   POSTERIOR LUMBAR FUSION  2004   TEE WITHOUT CARDIOVERSION N/A 11/01/2016   Procedure: TRANSESOPHAGEAL ECHOCARDIOGRAM (TEE);  Surgeon: MSanda Klein MD;  [facility];   Service: Cardiovascular;  Laterality: N/A;   TEE WITHOUT CARDIOVERSION N/A 03/22/2017   Procedure: TRANSESOPHAGEAL ECHOCARDIOGRAM (TEE);  Surgeon: CSherren Mocha MD;  [facility]  Service: Open Heart Surgery;  Laterality: N/A;   TONSILLECTOMY     TOTAL KNEE ARTHROPLASTY Right 07/24/2018   Procedure: RIGHT TOTAL KNEE ARTHROPLASTY;  Surgeon: AGaynelle Arabian MD;  Location: [facility];  Service: Orthopedics;  Laterality: Right;  555m   TOTAL KNEE ARTHROPLASTY Left 06/11/2019   Procedure: TOTAL KNEE ARTHROPLASTY;  Surgeon: AlGaynelle ArabianMD;  Location: [facility];  Service:  Orthopedics;  Laterality: Left;  32mn   TRANSCATHETER AORTIC VALVE REPLACEMENT, TRANSFEMORAL N/A 03/22/2017   Procedure: TRANSCATHETER AORTIC VALVE REPLACEMENT, TRANSFEMORAL;  Surgeon: CSherren Mocha MD;  [facility]  Service: Open Heart Surgery;  Laterality: N/A;    FAMILY HISTORY Family History  Problem Relation Age of Onset   Renal Disease Mother    Congestive Heart Failure Father    Depression Sister     SOCIAL HISTORY Social History   Tobacco Use   Smoking status: Never   Smokeless tobacco: Never  Vaping Use   Vaping Use: Never used  Substance Use Topics   Alcohol use: Yes    Alcohol/week: 2.0 standard drinks    Types: 1 Cans of beer, 1 Shots of liquor per week    Comment: daily   Drug use: No         OPHTHALMIC EXAM:  Base Eye Exam     Visual Acuity (ETDRS)       Right Left   Dist cc 20/20 20/25 -2         Tonometry (Tonopen, 9:20 AM)       Right Left   Pressure 10 9         Pupils       Pupils Dark Light Shape React APD   Right PERRL 5 4 Round Slow None   Left PERRL 4 3 Round Brisk None         Visual Fields (Counting fingers)       Left Right    Full Full         Extraocular Movement       Right Left    Full Full         Neuro/Psych     Oriented x3: Yes   Mood/Affect: Normal         Dilation     Both eyes: 1.0% Mydriacyl, 2.5% Phenylephrine @ 9:20  AM           Slit Lamp and Fundus Exam     External Exam       Right Left   External Normal Normal         Slit Lamp Exam       Right Left   Lids/Lashes Normal Normal   Conjunctiva/Sclera White and quiet White and quiet   Cornea Clear Clear   Anterior Chamber Deep and quiet Deep and quiet   Iris Round and reactive Round and reactive   Lens Clear Clear   Anterior Vitreous Normal Normal         Fundus Exam       Right Left   Posterior Vitreous Normal Normal   Disc Normal Normal   C/D Ratio 0.0 0.1   Macula Normal Normal yet posterior pole choroidal nevus, see comments under periphery   Vessels Normal Normal   Periphery Normal Choroidal nevus in the posterior pole approximately 3.5 x 3 disc diameters horizontal by vertical, with minor thickening noted and only 1 small area on the nasal aspect of the lesion of lipofuscin            IMAGING AND PROCEDURES  Imaging and Procedures for 04/27/21  Color Fundus Photography Optos - OU - Both Eyes       Right Eye Progression has no prior data. Disc findings include normal observations. Macula : normal observations. Vessels : normal observations. Periphery : normal observations.   Left Eye Progression has no prior data. Disc findings include normal observations. Macula :  normal observations. Vessels : normal observations. Periphery : normal observations.   Notes Choroidal nevus in the posterior pole OS, small lipofuscin single noted on nasal aspect of lesion, minor thickening     B-Scan Ultrasound - OS - Left Eye       Quality was good.   Notes Borderline lesion size for small tumor,,, with thickness 2.2-2.46 mm OS.  No fluid             ASSESSMENT/PLAN:  Pseudophakia of both eyes Stable OU  Bilateral posterior capsular opacification Minor OU  Choroidal nevus of left eye Risk factors for malignant transformation of a choroidal nevus: [  ]thickness greater than 3 mm,  [  ]subretinal fluid,   [  ]symptoms,  [ x ]orange pigment present,  [  ]margin within 3 mm of the optic disc,  [  ]ultrasonographic hollowness (versus solid/flat), medium internal reflectivity on scan [  ]absence of halo (A halo refers to a pigmented choroidal nevus surrounded by a circular band of depigmentation) [  ]absence of drusen [  x]size larger than 79m basal diameter (over 3 dd size) [  ]observed growth  AJO: 202, June 2019, 100-108  The nature of choroidal nevus was discussed with the patient and an informational form was offered.  Photo documentation was discussed with the patient.  Periodic follow-up may be needed for a lifetime. The patient's questions were answered. At minimum, annual exams will be needed if no signs of early growth.     ICD-10-CM   1. Choroidal nevus of left eye  D31.32 Color Fundus Photography Optos - OU - Both Eyes    B-Scan Ultrasound - OS - Left Eye    2. Pseudophakia of both eyes  Z96.1     3. Bilateral posterior capsular opacification  H26.493       1.  OD with borderline features of thickness as well as breath of choroidal nevus in the posterior pole.  While this is still technically a choroidal nevus, 1 small lipofuscin pigment deposition is noted on the nasal aspect of the lesion as well as around the dome of thickness of 2.46 mm approaching 3 mm with a basal diameter approaching 3 disc diameters.  No other high risk features are noted.  He has 2 out of the 7 high risk features.  Thus this is not likely a growing process but nonetheless borderline features prompted me to [facility]  2.  We will refer for baseline within the next few months examination and follow-up here in 6 months  3.  Ophthalmic Meds Ordered this visit:  No orders of the defined types were placed in this encounter.      Return in about 6 months (around 10/25/2021) for DILATE OU, COLOR FP, OCT, OS, B-Scan U/S.  There are  no Patient Instructions on file for this visit.   Explained the diagnoses, plan, and follow up with the patient and they expressed understanding.  Patient expressed understanding of the importance of proper follow up care.   GClent DemarkRankin M.D. Diseases & Surgery of the Retina and Vitreous Retina & Diabetic [facility]Her[facility] Village09/12/22     Abbreviations: M myopia (nearsighted); A astigmatism; H hyperopia (farsighted); P presbyopia; Mrx spectacle prescription;  CTL contact lenses; OD right eye; OS left eye; OU both eyes  XT exotropia; ET esotropia; PEK punctate epithelial keratitis; PEE punctate epithelial erosions; DES dry eye syndrome; MGD meibomian gland dysfunction; ATs  artificial tears; PFAT's preservative free artificial tears; [place] nuclear sclerotic cataract; PSC posterior subcapsular cataract; ERM epi-retinal membrane; PVD posterior vitreous detachment; RD retinal detachment; DM diabetes mellitus; DR diabetic retinopathy; NPDR non-proliferative diabetic retinopathy; PDR proliferative diabetic retinopathy; CSME clinically significant macular edema; DME diabetic macular edema; dbh dot blot hemorrhages; CWS cotton wool spot; POAG primary open angle glaucoma; C/D cup-to-disc ratio; HVF humphrey visual field; GVF goldmann visual field; OCT optical coherence tomography; IOP intraocular pressure; BRVO Branch retinal vein occlusion; CRVO central retinal vein occlusion; CRAO central retinal artery occlusion; BRAO branch retinal artery occlusion; RT retinal tear; SB scleral buckle; PPV pars plana vitrectomy; VH Vitreous hemorrhage; PRP panretinal laser photocoagulation; IVK intravitreal kenalog; VMT vitreomacular traction; MH Macular hole;  NVD neovascularization of the disc; NVE neovascularization elsewhere; AREDS age related eye disease study; ARMD age related macular degeneration; POAG primary open angle glaucoma; EBMD epithelial/anterior basement membrane dystrophy; ACIOL anterior chamber intraocular lens; IOL  intraocular lens; PCIOL posterior chamber intraocular lens; Phaco/IOL phacoemulsification with intraocular lens placement; [place] photorefractive keratectomy; LASIK laser assisted in situ keratomileusis; HTN hypertension; DM diabetes mellitus; COPD chronic obstructive pulmonary disease

## 2021-04-27 NOTE — Assessment & Plan Note (Signed)
Risk factors for malignant transformation of a choroidal nevus: [  ]thickness greater than 3 mm,  [  ]subretinal fluid,  [  ]symptoms,  [ x ]orange pigment present,  [  ]margin within 3 mm of the optic disc,  [  ]ultrasonographic hollowness (versus solid/flat), medium internal reflectivity on scan [  ]absence of halo (A halo refers to a pigmented choroidal nevus surrounded by a circular band of depigmentation) [  ]absence of drusen [  x]size larger than 12m basal diameter (over 3 dd size) [  ]observed growth  AJO: 202, June 2019, 100-108  The nature of choroidal nevus was discussed with the patient and an informational form was offered.  Photo documentation was discussed with the patient.  Periodic follow-up may be needed for a lifetime. The patient's questions were answered. At minimum, annual exams will be needed if no signs of early growth.

## 2021-04-27 NOTE — Assessment & Plan Note (Signed)
Minor OU 

## 2021-05-04 ENCOUNTER — Encounter (INDEPENDENT_AMBULATORY_CARE_PROVIDER_SITE_OTHER): Payer: Self-pay | Admitting: Ophthalmology

## 2021-05-05 ENCOUNTER — Ambulatory Visit: Payer: Medicare PPO | Admitting: Neurology

## 2021-05-05 ENCOUNTER — Encounter: Payer: Self-pay | Admitting: Neurology

## 2021-05-05 ENCOUNTER — Other Ambulatory Visit: Payer: Self-pay

## 2021-05-05 DIAGNOSIS — G40009 Localization-related (focal) (partial) idiopathic epilepsy and epileptic syndromes with seizures of localized onset, not intractable, without status epilepticus: Secondary | ICD-10-CM | POA: Diagnosis not present

## 2021-05-05 MED ORDER — LEVETIRACETAM 750 MG PO TABS: 750.0000 mg | ORAL_TABLET | Freq: Two times a day (BID) | ORAL | 3 refills | Status: DC

## 2021-05-05 NOTE — Progress Notes (Signed)
NEUROLOGY FOLLOW UP OFFICE NOTE  [name] [id number] 1946/07/16  HISTORY OF PRESENT ILLNESS: I had the pleasure of seeing Douglas Lane in follow-up in the neurology clinic on 05/05/2021.  The patient was last seen a year ago for recurrent seizures. He is alone in the office today. Records and images were personally reviewed where available.  Since his last visit, he reports one incident in the past year that occurred a couple of weeks ago where he lost time for 30 minutes. He woke up from his nap and sensed something had taken place that he could not recall, he knew he had a conversation with his son, but could not remember the conversation.  His son told him he was talking but "not there, sounded confused." He denied any olfactory/gustatory hallucinations, focal numbness/tingling/weakness, headache, dizziness, myoclonic jerks. No clear triggers. He continues on Levetiracetam 750mg  BID. He had a sleep study in 06/2020 which showed severe sleep apnea. He was kindly seen by Dr. Ander Slade who recommended CPAP in 08/2020, he only recently started using it a last month and is still adjusting. He reports sleep was better for a while, but it worsened again and he restarted clonazepam around the same time as starting his CPAP. He thinks the clonazepam helps him relax. He was started on Sertraline 2-3 weeks ago as well. He fell backward a few months ago, no injuries.    History on Initial Assessment 08/20/2014: This is a 75 yo RH man with a history of 2 seizures in 2012 that occurred 6 months apart, presenting after ER visit for a seizure last 08/03/14. The first seizure occurred in June 2012. He recalls it was a stressful period just soon after he retired. He had a nocturnal convulsion and was brought to [facility]. He was not started on medication initially as it was his first seizure. On December 2012, he had another nocturnal convulsion and was started on Keppra 500mg  BID with no further seizures  for 3 years.  He and his neurologist in [facility] agreed to start weaning off Keppra, and this was completely discontinued the spring/summer of 2015. Around 2-1/2 years ago, he had also been reporting anxiety to his neurologist, and was started on clorazepate 7.5mg , weaned to 1/2 tab for 6 months, then discontinued 3 days prior to the most recent nocturnal seizure on 12/16 off AEDs. He was brought to [facility] ER, there is no bloodwork or drug screen for review, CT head unremarkable.He was found to have posterior dislocation and comminuted fracture of the right humeral head. He had soft tissue injury extending along the right anterior proximal arm. This was reduced in the ER and he continues to wear a sling. He was restarted on Keppra 500mg  BID with no side effects except for mild drowsiness.   His wife reports that since coming off the [place] last year, he has had 3 incidences of memory lapses, where he could not recall the last few hours. His wife reports that he would look confused but he can function and drive. The last episode occurred 6 weeks ago, he did not recall being in church. His wife reports he was talking fine but had a different look about him. This lasted about 1-2 hours. He had neuropsychological testing for memory loss and was told he has "short-term memory problems."  In hindsight, when asked about an MRI done in January 2012, he had an episode where he had transient memory loss while he was at a [place].  I personally reviewed MRI brain without contrast done which showed mild chronic microvascular changes in the bilateral subcortical regions and right central pons.  He denies any episodes of staring/unresponsiveness, no olfactory/gustatory hallucinations, deja vu, rising epigastric sensation, focal numbness/tingling/weakness, myoclonic jerks. He has 1-4 drinks daily (beer or whisky), with no change in pattern for many years. He denies any sleep deprivation prior to the seizures, but reports  these being stressful periods. He continues to have anxiety and has "always been a Research officer, trade [employer]."   Epilepsy Risk Factors:  He had a normal birth and early development.  There is no history of febrile convulsions, CNS infections such as meningitis/encephalitis, significant traumatic brain injury, neurosurgical procedures, or family history of seizures.   PAST MEDICAL HISTORY: Past Medical History:  Diagnosis Date   Aortic atherosclerosis ([place]) 03/08/2017   CT   Aortic stenosis    severe AS s/p TAVR (03/22/17) with a 87mm Edwards Sapien 3 THV via the TF approach   Arthritis    "pain in my knees" (03/30/2016)   Bilateral cataracts    CAD S/P percutaneous coronary angioplasty 03/31/2016   a. CP/USA -> LHC 03/31/16: 90% prox-mid RCA s/p DES, 40% prox-mid LAD, 25% D2, 25% prox-mid Cx, elevated LVEDP 33.   Chronic diastolic CHF (congestive heart failure) ([place]) 03/31/2016   pt unaware, has family history   Chronic lower back pain    DDD (degenerative disc disease), lumbar    Dyslipidemia    Grade II diastolic dysfunction    Hearing loss    Heart murmur    Heartburn    occ. takes TUMS   Humeral fracture 07/2014   Right   Kidney cysts    1.6 cm simple cyst right   LVH (left ventricular hypertrophy)    a. severe basal septal hypertrophy by echo 03/2016.   Mitral regurgitation    a. mild by echo 03/2016.   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 07/2015, absent seizure   Sinus bradycardia - baseline HR 50s at times.    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 90 tablet 3   amLODipine (NORVASC) 5 MG tablet TAKE  1 TABLET(5 MG) BY MOUTH DAILY 30 tablet 2   aspirin EC 81 MG tablet Take 81 mg by mouth daily. Swallow whole.     calcium carbonate (TUMS - DOSED IN MG ELEMENTAL CALCIUM) 500 MG chewable tablet Chew 1 tablet by mouth daily as needed for heartburn.      chlorthalidone (HYGROTON) 25 MG tablet Take 1 tablet (25 mg total) by mouth daily. 90 tablet 3   clobetasol (TEMOVATE) 0.05 % external solution Apply 1 application topically 2 (two) times daily as needed (for psoriasis).     clobetasol ointment (TEMOVATE) 9.32 % Apply 1 application topically 2 (two) times daily as needed (for psoriasis).     clonazePAM (KLONOPIN) 0.5 MG tablet Take 0.5 mg by mouth 2 (two) times daily as needed for anxiety.     desonide (DESOWEN) 0.05 % ointment Apply 1 application topically daily as needed (psoriasis).  diphenhydrAMINE (BENADRYL) 25 mg capsule Take 25 mg by mouth daily as needed for allergies.     ezetimibe (ZETIA) 10 MG tablet TAKE 1 TABLET BY MOUTH DAILY 90 tablet 2   HUMIRA PEN 40 MG/0.8ML PNKT Inject 40 mg into the skin See admin instructions. Inject 40 mg SQ every 6 weeks     ibuprofen (ADVIL) 200 MG tablet Take 200 mg by mouth every 6 (six) hours as needed.     levETIRAcetam (KEPPRA) 750 MG tablet TAKE 1 TABLET(750 MG) BY MOUTH TWICE DAILY 120 tablet 0   potassium chloride (KLOR-CON) 10 MEQ tablet Take 1 tablet (10 mEq total) by mouth 2 (two) times daily. 60 tablet 11   PROCTOZONE-HC 2.5 % rectal cream Apply 1 application topically daily as needed for hemorrhoids or itching.      rosuvastatin (CRESTOR) 40 MG tablet TAKE 1 TABLET(40 MG) BY MOUTH DAILY 90 tablet 2   shark liver oil-cocoa butter (PREPARATION H) 0.25-3-85.5 % suppository Place 1 suppository rectally as needed for hemorrhoids.     nitroGLYCERIN (NITROSTAT) 0.4 MG SL tablet DISSOLVE 1 TABLET UNDER THE TONGUE EVERY 5 MINUTES AS NEEDED FOR CHEST PAIN( UP TO 3 DOSES) 25 tablet 5   terbinafine (LAMISIL) 250 MG tablet Take 250 mg by mouth See admin  instructions. Takes 1  Week out of each month     No current facility-administered medications on file prior to visit.    ALLERGIES: No Known Allergies  FAMILY HISTORY: Family History  Problem Relation Age of Onset   Renal Disease Mother    Congestive Heart Failure Father    Depression Sister     SOCIAL HISTORY: Social History   Socioeconomic History   Marital status: Married    Spouse name: Not on file   Number of children: 1   Years of education: Not on file   Highest education level: Not on file  Occupational History   Not on file  Tobacco Use   Smoking status: Never   Smokeless tobacco: Never  Vaping Use   Vaping Use: Never used  Substance and Sexual Activity   Alcohol use: Yes    Alcohol/week: 2.0 standard drinks    Types: 1 Cans of beer, 1 Shots of liquor per week    Comment: daily   Drug use: No   Sexual activity: Not Currently  Other Topics Concern   Not on file  Social History Narrative   Lives with wife and son in two story home      Right handed      Master's degree      Drinks caffeine (Coffee) daily   Social Determinants of Health   Financial Resource Strain: Not on file  Food Insecurity: Not on file  Transportation Needs: Not on file  Physical Activity: Not on file  Stress: Not on file  Social Connections: Not on file  Intimate Partner Violence: Not on file     PHYSICAL EXAM: Vitals:   05/05/21 1516  BP: 119/73  Pulse: 74  Resp: 18  SpO2: 95%   General: No acute distress Head:  Normocephalic/atraumatic Skin/Extremities: No rash, no edema Neurological Exam: alert and awake. No aphasia or dysarthria. Fund of knowledge is appropriate.  Recent and remote memory are intact.  Attention and concentration are normal.   Cranial nerves: Pupils equal, round. Extraocular movements intact with no nystagmus. Visual fields full.  No facial asymmetry.  Motor: Bulk and tone normal, muscle strength 5/5 throughout with no pronator drift.  Finger to  nose testing intact.  Gait narrow-based and steady, no ataxia.   IMPRESSION: This is a 75 yo RH man with a history of 2 convulsive seizures in 2012. After being seizure-free for 3 years, Keppra was tapered off in 2015. He was also taking clorazepate for anxiety and this was tapered off 3 days prior to a nocturnal seizure in 07/2014. No further convulsions since 2015, however his wife was reporting episodes of loss of time while off Keppra. The last time he reported one was in 2019, however today reports another episode of loss of time a couple of weeks ago. We agreed to continue on Levetiracetam 750mg  BID for now, but if these recur, would increase dose to 1000mg  BID. Encouraged to continue with CPAP use. He is aware of [place] driving laws to stop driving after a seizure until 6 months seizure-free. Follow-up in 1 year, call for any changes.    Thank you for allowing me to participate in his care.  Please do not hesitate to call for any questions or concerns.   Ellouise Newer, M.D.   CC: Dr. Harrington Challenger

## 2021-05-05 NOTE — Patient Instructions (Signed)
Good to see you. Continue Keppra 750mg  twice a day. Follow-up in 1 year, call for any changes.   Seizure Precautions: 1. If medication has been prescribed for you to prevent seizures, take it exactly as directed.  Do not stop taking the medicine without talking to your doctor first, even if you have not had a seizure in a long time.   2. Avoid activities in which a seizure would cause danger to yourself or to others.  Don't operate dangerous machinery, swim alone, or climb in high or dangerous places, such as on ladders, roofs, or girders.  Do not drive unless your doctor says you may.  3. If you have any warning that you may have a seizure, lay down in a safe place where you can't hurt yourself.    4.  No driving for 6 months from last seizure, as per [facility].   Please refer to the following link on the [place] website for more information: http://www.epilepsyfoundation.org/answerplace/Social/driving/drivingu.cfm   5.  Maintain good sleep hygiene. Avoid alcohol.  6.  Contact your doctor if you have any problems that may be related to the medicine you are taking.  7.  Call 911 and bring the patient back to the ED if:        A.  The seizure lasts longer than 5 minutes.       B.  The patient doesn't awaken shortly after the seizure  C.  The patient has new problems such as difficulty seeing, speaking or moving  D.  The patient was injured during the seizure  E.  The patient has a temperature over 102 F (39C)  F.  The patient vomited and now is having trouble breathing

## 2021-05-11 DIAGNOSIS — R972 Elevated prostate specific antigen [PSA]: Secondary | ICD-10-CM | POA: Diagnosis not present

## 2021-05-11 DIAGNOSIS — M25572 Pain in left ankle and joints of left foot: Secondary | ICD-10-CM | POA: Diagnosis not present

## 2021-05-15 DIAGNOSIS — G4733 Obstructive sleep apnea (adult) (pediatric): Secondary | ICD-10-CM | POA: Diagnosis not present

## 2021-05-28 DIAGNOSIS — R3912 Poor urinary stream: Secondary | ICD-10-CM | POA: Diagnosis not present

## 2021-05-28 DIAGNOSIS — N401 Enlarged prostate with lower urinary tract symptoms: Secondary | ICD-10-CM | POA: Diagnosis not present

## 2021-06-10 DIAGNOSIS — M25572 Pain in left ankle and joints of left foot: Secondary | ICD-10-CM | POA: Diagnosis not present

## 2021-06-14 DIAGNOSIS — G4733 Obstructive sleep apnea (adult) (pediatric): Secondary | ICD-10-CM | POA: Diagnosis not present

## 2021-06-15 ENCOUNTER — Other Ambulatory Visit: Payer: Self-pay | Admitting: Cardiology

## 2021-06-15 DIAGNOSIS — G4733 Obstructive sleep apnea (adult) (pediatric): Secondary | ICD-10-CM | POA: Diagnosis not present

## 2021-06-16 ENCOUNTER — Other Ambulatory Visit: Payer: Self-pay | Admitting: Cardiology

## 2021-06-16 ENCOUNTER — Encounter (INDEPENDENT_AMBULATORY_CARE_PROVIDER_SITE_OTHER): Payer: Self-pay

## 2021-06-16 DIAGNOSIS — D3132 Benign neoplasm of left choroid: Secondary | ICD-10-CM | POA: Diagnosis not present

## 2021-06-17 ENCOUNTER — Encounter: Payer: Self-pay | Admitting: Cardiology

## 2021-06-17 ENCOUNTER — Other Ambulatory Visit: Payer: Self-pay

## 2021-06-17 ENCOUNTER — Ambulatory Visit: Payer: Medicare PPO | Admitting: Cardiology

## 2021-06-17 VITALS — BP 118/70 | HR 62 | Ht 66.0 in | Wt 190.2 lb

## 2021-06-17 DIAGNOSIS — E785 Hyperlipidemia, unspecified: Secondary | ICD-10-CM

## 2021-06-17 DIAGNOSIS — I10 Essential (primary) hypertension: Secondary | ICD-10-CM | POA: Diagnosis not present

## 2021-06-17 DIAGNOSIS — I350 Nonrheumatic aortic (valve) stenosis: Secondary | ICD-10-CM | POA: Diagnosis not present

## 2021-06-17 DIAGNOSIS — I77819 Aortic ectasia, unspecified site: Secondary | ICD-10-CM | POA: Diagnosis not present

## 2021-06-17 DIAGNOSIS — Z9861 Coronary angioplasty status: Secondary | ICD-10-CM

## 2021-06-17 DIAGNOSIS — I5032 Chronic diastolic (congestive) heart failure: Secondary | ICD-10-CM | POA: Diagnosis not present

## 2021-06-17 DIAGNOSIS — I2510 Atherosclerotic heart disease of native coronary artery without angina pectoris: Secondary | ICD-10-CM | POA: Diagnosis not present

## 2021-06-17 MED ORDER — ROSUVASTATIN CALCIUM 40 MG PO TABS: ORAL_TABLET | ORAL | 3 refills | Status: DC

## 2021-06-17 MED ORDER — EZETIMIBE 10 MG PO TABS: 10.0000 mg | ORAL_TABLET | Freq: Every day | ORAL | 3 refills | Status: DC

## 2021-06-17 MED ORDER — AMLODIPINE BESYLATE 5 MG PO TABS: ORAL_TABLET | ORAL | 3 refills | Status: DC

## 2021-06-17 MED ORDER — CHLORTHALIDONE 25 MG PO TABS
25.0000 mg | ORAL_TABLET | Freq: Every day | ORAL | 3 refills | Status: DC
Start: 2021-06-17 — End: 2022-06-29

## 2021-06-17 MED ORDER — POTASSIUM CHLORIDE ER 10 MEQ PO TBCR: 10.0000 meq | EXTENDED_RELEASE_TABLET | Freq: Two times a day (BID) | ORAL | 3 refills | Status: DC

## 2021-06-17 NOTE — Patient Instructions (Signed)

## 2021-06-17 NOTE — Progress Notes (Signed)
Cardiology Office Note:    Date:  06/17/2021   ID:  Douglas Lane, DOB 07/14/1946, MRN [id number]  PCP:  Lawerance Cruel, MD  Cardiologist:  Fransico Him, MD    Referring MD: Lawerance Cruel, MD   Chief Complaint  Patient presents with   Coronary Artery Disease   Hypertension   Hyperlipidemia   Aortic Stenosis   Congestive Heart Failure     History of Present Illness:    Douglas Lane is a 75 y.o. male with a hx of HLD, CAD s/p DES to RCA (03/2016) and 40% prox LAD and 50% ostial PDA, HTN, chronic diastolic CHF and severe AS s/p TAVR (03/22/17) with a 5mm Edwards Sapien 3 THV via the TF approach. Echo post TAVR showed normal LVF with G2DD with stable TAVR with AVA 2cm2, mean AVG 33mmHg and peak AVG 47mmHg.   He is here today for followup and is doing well.  He denies any chest pain or pressure, SOB, DOE, PND, orthopnea, LE edema, dizziness, palpitations or syncope. He is compliant with his meds and is tolerating meds with no SE.     Past Medical History:  Diagnosis Date   Aortic atherosclerosis ([place]) 03/08/2017   CT   Aortic stenosis    severe AS s/p TAVR (03/22/17) with a 51mm Edwards Sapien 3 THV via the TF approach   Arthritis    "pain in my knees" (03/30/2016)   Bilateral cataracts    CAD S/P percutaneous coronary angioplasty 03/31/2016   a. CP/USA -> LHC 03/31/16: 90% prox-mid RCA s/p DES, 40% prox-mid LAD, 25% D2, 25% prox-mid Cx, elevated LVEDP 33.   Chronic diastolic CHF (congestive heart failure) ([place]) 03/31/2016   pt unaware, has family history   Chronic lower back pain    DDD (degenerative disc disease), lumbar    Dyslipidemia    Grade II diastolic dysfunction    Hearing loss    Heart murmur    Heartburn    occ. takes TUMS   Humeral fracture 07/2014   Right   Kidney cysts    1.6 cm simple cyst right   LVH (left ventricular hypertrophy)    a. severe basal septal hypertrophy by echo 03/2016.   Mitral regurgitation    a. mild by echo 03/2016.    Osteopenia 03/13/2009   Psoriasis    Rheumatic fever    as a child   Scoliosis 03/13/2009   Seizure ([place]) 01/2011; 07/2011; 07/2015   "idiopathic; been on Kepra since 07/2011", last seizure poss. 07/2015, absent seizure   Sinus bradycardia - baseline HR 50s at times.    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    Thrombosed hemorrhoids     Past Surgical History:  Procedure Laterality Date   APPENDECTOMY     BACK SURGERY     CARDIAC CATHETERIZATION N/A 03/30/2016   Procedure: Right/Left Heart Cath and Coronary Angiography;  Surgeon: Jettie Booze, MD;  Location: [place] [facility];  Service: Cardiovascular;  Laterality: N/A;   CARDIAC CATHETERIZATION N/A 03/30/2016   Procedure: Coronary Stent Intervention;  Surgeon: Jettie Booze, MD;  Location: [place] [facility];  Service: Cardiovascular;  Laterality: N/A;   CATARACT EXTRACTION, BILATERAL     COLONOSCOPY     CORONARY ANGIOPLASTY     FOREARM FRACTURE SURGERY Right ~ 2003-2004   "got steel plate in there"   HERNIA REPAIR  2009   I&D hemorrhoirds     KNEE  ARTHROSCOPY Bilateral    MOHS SURGERY Left    temple   ORIF SCAPULAR FRACTURE Right    right   POSTERIOR LUMBAR FUSION  2004   TEE WITHOUT CARDIOVERSION N/A 11/01/2016   Procedure: TRANSESOPHAGEAL ECHOCARDIOGRAM (TEE);  Surgeon: Sanda Klein, MD;  Location: [facility] END[facility];  Service: Cardiovascular;  Laterality: N/A;   TEE WITHOUT CARDIOVERSION N/A 03/22/2017   Procedure: TRANSESOPHAGEAL ECHOCARDIOGRAM (TEE);  Surgeon: Sherren Mocha, MD;  Location: [place];  Service: Open Heart Surgery;  Laterality: N/A;   TONSILLECTOMY     TOTAL KNEE ARTHROPLASTY Right 07/24/2018   Procedure: RIGHT TOTAL KNEE ARTHROPLASTY;  Surgeon: Gaynelle Arabian, MD;  Location: [facility];  Service: Orthopedics;  Laterality: Right;  57min   TOTAL KNEE ARTHROPLASTY Left 06/11/2019   Procedure: TOTAL KNEE ARTHROPLASTY;  Surgeon: Gaynelle Arabian, MD;  Location: [facility];  Service: Orthopedics;   Laterality: Left;  23min   TRANSCATHETER AORTIC VALVE REPLACEMENT, TRANSFEMORAL N/A 03/22/2017   Procedure: TRANSCATHETER AORTIC VALVE REPLACEMENT, TRANSFEMORAL;  Surgeon: Sherren Mocha, MD;  Location: [place];  Service: Open Heart Surgery;  Laterality: N/A;    Current Medications: Current Meds  Medication Sig   amLODipine (NORVASC) 5 MG tablet TAKE 1 TABLET(5 MG) BY MOUTH DAILY   aspirin EC 81 MG tablet Take 81 mg by mouth daily. Swallow whole.   calcium carbonate (TUMS - DOSED IN MG ELEMENTAL CALCIUM) 500 MG chewable tablet Chew 1 tablet by mouth daily as needed for heartburn.    chlorthalidone (HYGROTON) 25 MG tablet Take 1 tablet (25 mg total) by mouth daily.   clobetasol ointment (TEMOVATE) 7.32 % Apply 1 application topically 2 (two) times daily as needed (for psoriasis).   clonazePAM (KLONOPIN) 0.5 MG tablet Take 0.5 mg by mouth 2 (two) times daily as needed for anxiety.   desonide (DESOWEN) 0.05 % ointment Apply 1 application topically daily as needed (psoriasis).    diphenhydrAMINE (BENADRYL) 25 mg capsule Take 25 mg by mouth daily as needed for allergies.   ezetimibe (ZETIA) 10 MG tablet TAKE 1 TABLET BY MOUTH DAILY   HUMIRA PEN 40 MG/0.8ML PNKT Inject 40 mg into the skin See admin instructions. Inject 40 mg SQ every 6 weeks   ibuprofen (ADVIL) 200 MG tablet Take 200 mg by mouth every 6 (six) hours as needed.   levETIRAcetam (KEPPRA) 750 MG tablet Take 1 tablet (750 mg total) by mouth 2 (two) times daily.   potassium chloride (KLOR-CON) 10 MEQ tablet Take 1 tablet (10 mEq total) by mouth 2 (two) times daily.   PROCTOZONE-HC 2.5 % rectal cream Apply 1 application topically daily as needed for hemorrhoids or itching.    rosuvastatin (CRESTOR) 40 MG tablet TAKE 1 TABLET(40 MG) BY MOUTH DAILY   shark liver oil-cocoa butter (PREPARATION H) 0.25-3-85.5 % suppository Place 1 suppository rectally as needed for hemorrhoids.     Allergies:   Patient has no known allergies.   Social History    Socioeconomic History   Marital status: Married    Spouse name: Not on file   Number of children: 1   Years of education: Not on file   Highest education level: Not on file  Occupational History   Not on file  Tobacco Use   Smoking status: Never   Smokeless tobacco: Never  Vaping Use   Vaping Use: Never used  Substance and Sexual Activity   Alcohol use: Yes    Alcohol/week: 2.0 standard drinks    Types: 1 Cans of beer, 1 Shots of  liquor per week    Comment: daily   Drug use: No   Sexual activity: Not Currently  Other Topics Concern   Not on file  Social History Narrative   Lives with wife and son in two story home      Right handed      Master's degree      Drinks caffeine (Coffee) daily   Social Determinants of Health   Financial Resource Strain: Not on file  Food Insecurity: Not on file  Transportation Needs: Not on file  Physical Activity: Not on file  Stress: Not on file  Social Connections: Not on file     Family History: The patient's family history includes Congestive Heart Failure in his father; Depression in his sister; Renal Disease in his mother.  ROS:   Please see the history of present illness.    ROS  All other systems reviewed and negative.   EKGs/Labs/Other Studies Reviewed:    The following studies were reviewed today: none  EKG:  EKG is ordered today and showed NSR with LBBB  Recent Labs: 09/12/2020: BUN 17; Creatinine, Ser 0.97; Potassium 3.8; Sodium 141   Recent Lipid Panel    Component Value Date/Time   CHOL 125 03/21/2019 0746   TRIG 67 03/21/2019 0746   HDL 52 03/21/2019 0746   CHOLHDL 2.4 03/21/2019 0746   CHOLHDL 2.6 08/06/2016 0831   VLDL 16 08/06/2016 0831   LDLCALC 60 03/21/2019 0746    Physical Exam:    VS:  BP 118/70   Pulse 62   Ht 5\' 6"  (1.676 m)   Wt 190 lb 3.2 oz (86.3 kg)   SpO2 94%   BMI 30.70 kg/m     Wt Readings from Last 3 Encounters:  06/17/21 190 lb 3.2 oz (86.3 kg)  05/05/21 187 lb (84.8  kg)  09/12/20 195 lb 6.4 oz (88.6 kg)    GEN: Well nourished, well developed in no acute distress HEENT: Normal NECK: No JVD; No carotid bruits LYMPHATICS: No lymphadenopathy CARDIAC:RRR, no murmurs, rubs, gallops RESPIRATORY:  Clear to auscultation without rales, wheezing or rhonchi  ABDOMEN: Soft, non-tender, non-distended MUSCULOSKELETAL:  No edema; No deformity  SKIN: Warm and dry NEUROLOGIC:  Alert and oriented x 3 PSYCHIATRIC:  Normal affect    ASSESSMENT:    1. CAD S/P percutaneous coronary angioplasty   2. Severe aortic stenosis   3. Chronic diastolic heart failure ([place])   4. Benign essential HTN   5. Hyperlipidemia, unspecified hyperlipidemia type   6. Aortic dilatation ([facility])     PLAN:    In order of problems listed above:  1.  ASCAD -s/p DES to RCA (03/2016) and 40% prox LAD and 50% ostial PDA -He is doing well and denies any anginal symptoms -Continue aspirin and statin  2.  Severe AS -s/p TAVR (03/22/17) with a 82mm Edwards Sapien 3 THV via the TF approach -echo 10/2018 showed stable TAVR with mean AVG 13mmHg and trivial perivavular AR -Repeat echo 10/2021 5 years out from TAVR -continue ASA  3.  Chronic diastolic CHF -He denies any shortness of breath or lower extremity edema and weight is stable -He does not appear volume overloaded on exam today -continue prescription drug management with Chlorthalidone 25mg  daily and Kdur - refilled  4.  HTN -BP is adequately controlled on exam today -reminded him to follow a 2gm Na diet and avoid processed meats and table salt -Continue prescription drug management with amlodipine 5 mg daily and Chlorthalidone 25mg   daily with as needed refills -goal BP is 130/44mmHg or below  5.  HLD -LDL goal < 70.   -I have personally reviewed and interpreted outside labs performed by patient's PCP which showed LDL 56, HDL 52, tags 84, ALT 22 on 04/16/2021 -He will continue prescription drug management with Crestor 40 mg daily and  Zetia 10 mg daily-these were refilled today   6.  Mild dilated ascending aorta -measured 39mm by echo 10/2018 -BP goal less than 130/70 mmHg -continue statin   Medication Adjustments/Labs and Tests Ordered: Current medicines are reviewed at length with the patient today.  Concerns regarding medicines are outlined above.  Orders Placed This Encounter  Procedures   EKG 12-Lead    No orders of the defined types were placed in this encounter.   Signed, Fransico Him, MD  06/17/2021 11:16 AM    [place]

## 2021-06-17 NOTE — Addendum Note (Signed)
Addended by: Antonieta Iba on: 06/17/2021 11:26 AM   Modules accepted: Orders

## 2021-06-17 NOTE — Addendum Note (Signed)
Addended by: Antonieta Iba on: 06/17/2021 11:27 AM   Modules accepted: Orders

## 2021-06-23 DIAGNOSIS — R197 Diarrhea, unspecified: Secondary | ICD-10-CM | POA: Diagnosis not present

## 2021-06-24 DIAGNOSIS — R197 Diarrhea, unspecified: Secondary | ICD-10-CM | POA: Diagnosis not present

## 2021-07-18 ENCOUNTER — Other Ambulatory Visit: Payer: Self-pay | Admitting: Cardiology

## 2021-07-20 DIAGNOSIS — R195 Other fecal abnormalities: Secondary | ICD-10-CM | POA: Diagnosis not present

## 2021-07-20 DIAGNOSIS — R197 Diarrhea, unspecified: Secondary | ICD-10-CM | POA: Diagnosis not present

## 2021-07-22 DIAGNOSIS — R197 Diarrhea, unspecified: Secondary | ICD-10-CM | POA: Diagnosis not present

## 2021-08-04 ENCOUNTER — Encounter: Payer: Self-pay | Admitting: Pulmonary Disease

## 2021-08-04 DIAGNOSIS — G4733 Obstructive sleep apnea (adult) (pediatric): Secondary | ICD-10-CM

## 2021-08-04 NOTE — Telephone Encounter (Signed)
AO please advise. Thanks  having increasing difficulty tolerating full face mask for extended times at night; mask seal loosens, waking me up with cold air on face, noise, dry mouth, sore throat;  is there an alternative to the full face mask;

## 2021-08-08 NOTE — Telephone Encounter (Signed)
Nasal mask may be tried with addition of a chinstrap if needed

## 2021-09-16 DIAGNOSIS — D3132 Benign neoplasm of left choroid: Secondary | ICD-10-CM | POA: Diagnosis not present

## 2021-09-28 DIAGNOSIS — L400 Psoriasis vulgaris: Secondary | ICD-10-CM | POA: Diagnosis not present

## 2021-09-28 DIAGNOSIS — D485 Neoplasm of uncertain behavior of skin: Secondary | ICD-10-CM | POA: Diagnosis not present

## 2021-09-28 DIAGNOSIS — Z85828 Personal history of other malignant neoplasm of skin: Secondary | ICD-10-CM | POA: Diagnosis not present

## 2021-09-28 DIAGNOSIS — L570 Actinic keratosis: Secondary | ICD-10-CM | POA: Diagnosis not present

## 2021-09-28 DIAGNOSIS — D1801 Hemangioma of skin and subcutaneous tissue: Secondary | ICD-10-CM | POA: Diagnosis not present

## 2021-09-28 DIAGNOSIS — L821 Other seborrheic keratosis: Secondary | ICD-10-CM | POA: Diagnosis not present

## 2021-10-14 DIAGNOSIS — R197 Diarrhea, unspecified: Secondary | ICD-10-CM | POA: Diagnosis not present

## 2021-10-14 DIAGNOSIS — F430 Acute stress reaction: Secondary | ICD-10-CM | POA: Diagnosis not present

## 2021-10-19 DIAGNOSIS — K648 Other hemorrhoids: Secondary | ICD-10-CM | POA: Diagnosis not present

## 2021-10-19 DIAGNOSIS — Z1211 Encounter for screening for malignant neoplasm of colon: Secondary | ICD-10-CM | POA: Diagnosis not present

## 2021-10-19 DIAGNOSIS — D123 Benign neoplasm of transverse colon: Secondary | ICD-10-CM | POA: Diagnosis not present

## 2021-10-19 DIAGNOSIS — K5730 Diverticulosis of large intestine without perforation or abscess without bleeding: Secondary | ICD-10-CM | POA: Diagnosis not present

## 2021-10-19 DIAGNOSIS — K644 Residual hemorrhoidal skin tags: Secondary | ICD-10-CM | POA: Diagnosis not present

## 2021-10-20 ENCOUNTER — Encounter (INDEPENDENT_AMBULATORY_CARE_PROVIDER_SITE_OTHER): Payer: Self-pay | Admitting: Ophthalmology

## 2021-10-21 DIAGNOSIS — D123 Benign neoplasm of transverse colon: Secondary | ICD-10-CM | POA: Diagnosis not present

## 2021-10-26 ENCOUNTER — Encounter (INDEPENDENT_AMBULATORY_CARE_PROVIDER_SITE_OTHER): Payer: Self-pay | Admitting: Ophthalmology

## 2021-10-26 ENCOUNTER — Other Ambulatory Visit: Payer: Self-pay

## 2021-10-26 ENCOUNTER — Ambulatory Visit (INDEPENDENT_AMBULATORY_CARE_PROVIDER_SITE_OTHER): Payer: Medicare PPO | Admitting: Ophthalmology

## 2021-10-26 DIAGNOSIS — D3132 Benign neoplasm of left choroid: Secondary | ICD-10-CM | POA: Diagnosis not present

## 2021-10-26 NOTE — Progress Notes (Signed)
10/26/2021     CHIEF COMPLAINT Patient presents for  Chief Complaint  Patient presents with   Retina Follow Up      HISTORY OF PRESENT ILLNESS: Douglas Lane is a 76 y.o. male who presents to the clinic today for:   HPI     Retina Follow Up           Diagnosis: Other (Choroidal Nevus)   Laterality: left eye   Onset: 6 months ago   Severity: mild   Course: stable         Comments   6 mos fu OU oct FP, BSCAN OS. Patient states vision is stable and unchanged since last visit. Denies any new floaters or FOL. Pt states he has been seeing Dr. Daralene Milch at [facility] and would like the current notes and past notes sent over.       Last edited by Laurin Coder on 10/26/2021  9:51 AM.      Referring physician: Lezlie Lye, MD [place],  [place] [address]  HISTORICAL INFORMATION:   Selected notes from the MEDICAL RECORD NUMBER    Lab Results  Component Value Date   HGBA1C 5.5 03/17/2017     CURRENT MEDICATIONS: No current outpatient medications on file. (Ophthalmic Drugs)   No current facility-administered medications for this visit. (Ophthalmic Drugs)   Current Outpatient Medications (Other)  Medication Sig   amLODipine (NORVASC) 5 MG tablet TAKE 1 TABLET(5 MG) BY MOUTH DAILY   aspirin EC 81 MG tablet Take 81 mg by mouth daily. Swallow whole.   calcium carbonate (TUMS - DOSED IN MG ELEMENTAL CALCIUM) 500 MG chewable tablet Chew 1 tablet by mouth daily as needed for heartburn.    chlorthalidone (HYGROTON) 25 MG tablet Take 1 tablet (25 mg total) by mouth daily.   clobetasol (TEMOVATE) 0.05 % external solution Apply 1 application topically 2 (two) times daily as needed (for psoriasis). (Patient not taking: Reported on 06/17/2021)   clobetasol ointment (TEMOVATE) 6.33 % Apply 1 application topically 2 (two) times daily as needed (for psoriasis).   clonazePAM (KLONOPIN) 0.5 MG tablet Take 0.5 mg by mouth 2 (two) times daily as needed for anxiety.    desonide (DESOWEN) 0.05 % ointment Apply 1 application topically daily as needed (psoriasis).    diphenhydrAMINE (BENADRYL) 25 mg capsule Take 25 mg by mouth daily as needed for allergies.   ezetimibe (ZETIA) 10 MG tablet Take 1 tablet (10 mg total) by mouth daily.   HUMIRA PEN 40 MG/0.8ML PNKT Inject 40 mg into the skin See admin instructions. Inject 40 mg SQ every 6 weeks   ibuprofen (ADVIL) 200 MG tablet Take 200 mg by mouth every 6 (six) hours as needed.   levETIRAcetam (KEPPRA) 750 MG tablet Take 1 tablet (750 mg total) by mouth 2 (two) times daily.   potassium chloride (KLOR-CON) 10 MEQ tablet Take 1 tablet (10 mEq total) by mouth 2 (two) times daily.   PROCTOZONE-HC 2.5 % rectal cream Apply 1 application topically daily as needed for hemorrhoids or itching.    rosuvastatin (CRESTOR) 40 MG tablet TAKE 1 TABLET(40 MG) BY MOUTH DAILY   sertraline (ZOLOFT) 50 MG tablet Take by mouth.   shark liver oil-cocoa butter (PREPARATION H) 0.25-3-85.5 % suppository Place 1 suppository rectally as needed for hemorrhoids.   No current facility-administered medications for this visit. (Other)      REVIEW OF SYSTEMS: ROS   Negative for: Constitutional, Gastrointestinal, Neurological, Skin, Genitourinary, Musculoskeletal, HENT,  Endocrine, Cardiovascular, Eyes, Respiratory, Psychiatric, Allergic/Imm, Heme/Lymph Last edited by Hurman Horn, MD on 10/26/2021 11:02 AM.       ALLERGIES No Known Allergies  PAST MEDICAL HISTORY Past Medical History:  Diagnosis Date   Aortic atherosclerosis ([place]) 03/08/2017   CT   Aortic stenosis    severe AS s/p TAVR (03/22/17) with a 95m Edwards Sapien 3 THV via the TF approach   Arthritis    "pain in my knees" (03/30/2016)   Bilateral cataracts    CAD S/P percutaneous coronary angioplasty 03/31/2016   a. CP/USA -> LHC 03/31/16: 90% prox-mid RCA s/p DES, 40% prox-mid LAD, 25% D2, 25% prox-mid Cx, elevated LVEDP 33.   Chronic diastolic CHF (congestive heart  failure) ([place] 03/31/2016   pt unaware, has family history   Chronic lower back pain    DDD (degenerative disc disease), lumbar    Dyslipidemia    Grade II diastolic dysfunction    Hearing loss    Heart murmur    Heartburn    occ. takes TUMS   Humeral fracture 07/2014   Right   Kidney cysts    1.6 cm simple cyst right   LVH (left ventricular hypertrophy)    a. severe basal septal hypertrophy by echo 03/2016.   Mitral regurgitation    a. mild by echo 03/2016.   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 07/2015, absent seizure   Sinus bradycardia - baseline HR 50s at times.    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    Thrombosed hemorrhoids    Past Surgical History:  Procedure Laterality Date   APPENDECTOMY     BACK SURGERY     CARDIAC CATHETERIZATION N/A 03/30/2016   Procedure: Right/Left Heart Cath and Coronary Angiography;  Surgeon: JJettie Booze MD;  [facility];  Service: Cardiovascular;  Laterality: N/A;   CARDIAC CATHETERIZATION N/A 03/30/2016   Procedure: Coronary Stent Intervention;  Surgeon: JJettie Booze MD;  [facility];  Service: Cardiovascular;  Laterality: N/A;   CATARACT EXTRACTION, BILATERAL     COLONOSCOPY     CORONARY ANGIOPLASTY     FOREARM FRACTURE SURGERY Right ~ 2003-2004   "got steel plate in there"   HERNIA REPAIR  2009   I&D hemorrhoirds     KNEE ARTHROSCOPY Bilateral    MOHS SURGERY Left    temple   ORIF SCAPULAR FRACTURE Right    right   POSTERIOR LUMBAR FUSION  2004   TEE WITHOUT CARDIOVERSION N/A 11/01/2016   Procedure: TRANSESOPHAGEAL ECHOCARDIOGRAM (TEE);  Surgeon: MSanda Klein MD;  [facility];  Service: Cardiovascular;  Laterality: N/A;   TEE WITHOUT CARDIOVERSION N/A 03/22/2017   Procedure: TRANSESOPHAGEAL  ECHOCARDIOGRAM (TEE);  Surgeon: CSherren Mocha MD;  [facility]  Service: Open Heart Surgery;  Laterality: N/A;   TONSILLECTOMY     TOTAL KNEE ARTHROPLASTY Right 07/24/2018   Procedure: RIGHT TOTAL KNEE ARTHROPLASTY;  Surgeon: AGaynelle Arabian MD;  [facility];  Service: Orthopedics;  Laterality: Right;  55m   TOTAL KNEE ARTHROPLASTY Left 06/11/2019   Procedure: TOTAL KNEE ARTHROPLASTY;  Surgeon: AlGaynelle ArabianMD;  [facility];  Service: Orthopedics;  Laterality: Left;  5025m  TRANSCATHETER AORTIC VALVE REPLACEMENT, TRANSFEMORAL N/A 03/22/2017   Procedure:  TRANSCATHETER AORTIC VALVE REPLACEMENT, TRANSFEMORAL;  Surgeon: Sherren Mocha, MD;  Location: [facility];  Service: Open Heart Surgery;  Laterality: N/A;    FAMILY HISTORY Family History  Problem Relation Age of Onset   Renal Disease Mother    Congestive Heart Failure Father    Depression Sister     SOCIAL HISTORY Social History   Tobacco Use   Smoking status: Never   Smokeless tobacco: Never  Vaping Use   Vaping Use: Never used  Substance Use Topics   Alcohol use: Yes    Alcohol/week: 2.0 standard drinks    Types: 1 Cans of beer, 1 Shots of liquor per week    Comment: daily   Drug use: No         OPHTHALMIC EXAM:  Base Eye Exam     Visual Acuity (ETDRS)       Right Left   Dist cc 20/20 20/25 -1+2    Correction: Glasses         Tonometry (Tonopen, 9:52 AM)       Right Left   Pressure 10 12         Pupils       Pupils Dark Light APD   Right PERRL 5 4 None   Left PERRL 4 3 None         Visual Fields (Counting fingers)       Left Right    Full Full         Extraocular Movement       Right Left    Full Full         Neuro/Psych     Oriented x3: Yes   Mood/Affect: Normal         Dilation     Both eyes: 1.0% Mydriacyl, 2.5% Phenylephrine @ 9:52 AM           Slit Lamp and Fundus Exam     External Exam       Right Left   External Normal Normal          Slit Lamp Exam       Right Left   Lids/Lashes Normal Normal   Conjunctiva/Sclera White and quiet White and quiet   Cornea Clear Clear   Anterior Chamber Deep and quiet Deep and quiet   Iris Round and reactive Round and reactive   Lens Clear Clear   Anterior Vitreous Normal Normal         Fundus Exam       Right Left   Posterior Vitreous Normal Normal   Disc Normal Normal   C/D Ratio 0.0 0.1   Macula Normal Normal yet posterior pole choroidal nevus, see comments under periphery   Vessels Normal Normal   Periphery Normal Choroidal nevus in the posterior pole approximately 3.5 x 3 disc diameters horizontal by vertical, with minor thickening noted and only 1 small area on the nasal aspect of the lesion of lipofuscin            IMAGING AND PROCEDURES  Imaging and Procedures for 10/26/21  OCT, Retina - OU - Both Eyes       Right Eye Quality was good. Scan locations included subfoveal. Findings include normal foveal contour.   Left Eye Quality was good. Scan locations included subfoveal. Findings include normal foveal contour.      Color Fundus Photography Optos - OU - Both Eyes       Right Eye Progression has no prior data. Disc findings include  normal observations. Macula : normal observations. Vessels : normal observations. Periphery : normal observations.   Left Eye Progression has no prior data. Disc findings include normal observations. Macula : normal observations. Vessels : normal observations. Periphery : normal observations.   Notes Choroidal nevus in the posterior pole OS, small lipofuscin single noted on nasal aspect of lesion, minor thickening, with linear thickness measured at 5.6 mm vertical and 5.7 mm horizontal stable over the last 6 months no lipofuscin, no atrophy, no fluid     B-Scan Ultrasound - OS - Left Eye       Quality was good.   Notes Choroidal nevus temporal to fovea region, no subretinal fluid.  Measured thickness  approximately 1.8 mm basal diameter 6 x    7.5 to 8 mm             ASSESSMENT/PLAN:  Choroidal nevus of left eye Risk factors for malignant transformation of a choroidal nevus: [  ]thickness greater than 3 mm,  [  ]subretinal fluid,  [  ]symptoms,  [  ]orange pigment present,  [  ]margin within 3 mm of the optic disc,  [  ]ultrasonographic hollowness (versus solid/flat), medium internal reflectivity on scan [  ]absence of halo (A halo refers to a pigmented choroidal nevus surrounded by a circular band of depigmentation) [  ]absence of drusen [ x]size larger than 28m basal diameter (over 3 dd size) [  ]observed growth  AJO: 202, June 2019, 100-108  The nature of choroidal nevus was discussed with the patient and an informational form was offered.  Photo documentation was discussed with the patient.  Periodic follow-up may be needed for a lifetime. The patient's questions were answered. At minimum, annual exams will be needed if no signs of early growth.OS, no interval change over the last 6 months the examination here and also back in September 2022.  [facility] with no interval changes described in Dr. MNori Riisnotes      ICD-10-CM   1. Choroidal nevus of left eye  D31.32 OCT, Retina - OU - Both Eyes    Color Fundus Photography Optos - OU - Both Eyes    B-Scan Ultrasound - OS - Left Eye      1.  2.  3.  Ophthalmic Meds Ordered this visit:  No orders of the defined types were placed in this encounter.      Return if symptoms worsen or fail to improve, for , And can continue the care of Dr.  MDaralene Milch  [facility]  There are no Patient Instructions on file for this visit.   Explained the diagnoses, plan, and follow up with the patient and they expressed understanding.  Patient expressed understanding of the importance of proper follow up care.   GClent DemarkRankin  M.D. Diseases & Surgery of the Retina and Vitreous Retina & Diabetic EElizabethtown03/13/23     Abbreviations: M myopia (nearsighted); A astigmatism; H hyperopia (farsighted); P presbyopia; Mrx spectacle prescription;  CTL contact lenses; OD right eye; OS left eye; OU both eyes  XT exotropia; ET esotropia; PEK punctate epithelial keratitis; PEE punctate epithelial erosions; DES dry eye syndrome; MGD meibomian gland dysfunction; ATs artificial tears; PFAT's preservative free artificial tears; NRohrersvillenuclear sclerotic cataract; PSC posterior subcapsular cataract; ERM epi-retinal membrane; PVD posterior vitreous detachment; RD retinal detachment; DM diabetes mellitus; DR diabetic retinopathy; NPDR non-proliferative diabetic retinopathy; PDR proliferative diabetic  retinopathy; CSME clinically significant macular edema; DME diabetic macular edema; dbh dot blot hemorrhages; CWS cotton wool spot; POAG primary open angle glaucoma; C/D cup-to-disc ratio; HVF humphrey visual field; GVF goldmann visual field; OCT optical coherence tomography; IOP intraocular pressure; BRVO Branch retinal vein occlusion; CRVO central retinal vein occlusion; CRAO central retinal artery occlusion; BRAO branch retinal artery occlusion; RT retinal tear; SB scleral buckle; PPV pars plana vitrectomy; VH Vitreous hemorrhage; PRP panretinal laser photocoagulation; IVK intravitreal kenalog; VMT vitreomacular traction; MH Macular hole;  NVD neovascularization of the disc; NVE neovascularization elsewhere; AREDS age related eye disease study; ARMD age related macular degeneration; POAG primary open angle glaucoma; EBMD epithelial/anterior basement membrane dystrophy; ACIOL anterior chamber intraocular lens; IOL intraocular lens; PCIOL posterior chamber intraocular lens; Phaco/IOL phacoemulsification with intraocular lens placement; [place] photorefractive keratectomy; LASIK laser assisted in situ keratomileusis; HTN hypertension; DM diabetes mellitus; COPD  chronic obstructive pulmonary disease

## 2021-10-26 NOTE — Assessment & Plan Note (Addendum)
Risk factors for malignant transformation of a choroidal nevus: ?[  ]thickness greater than 3 mm,  ?[  ]subretinal fluid,  ?[  ]symptoms,  ?[  ]orange pigment present,  ?[  ]margin within 3 mm of the optic disc,  ?[  ]ultrasonographic hollowness (versus solid/flat), medium internal reflectivity on scan ?[  ]absence of halo (A halo refers to a pigmented choroidal nevus surrounded by a circular band of depigmentation) ?[  ]absence of drusen ?[ x]size larger than 1m basal diameter (over 3 dd size) ?[  ]observed growth ? ?AJO: 202, June 2019, 100-108 ? ?The nature of choroidal nevus was discussed with the patient and an informational form was offered.  Photo documentation was discussed with the patient.  Periodic follow-up may be needed for a lifetime. The patient's questions were answered. At minimum, annual exams will be needed if no signs of early growth.OS, no interval change over the last 6 months the examination here and also back in September 2022. ? ?[facility] no interval changes described in Dr. MNori Riisnotes ?

## 2021-12-02 ENCOUNTER — Telehealth: Payer: Self-pay | Admitting: Pulmonary Disease

## 2021-12-02 DIAGNOSIS — G4733 Obstructive sleep apnea (adult) (pediatric): Secondary | ICD-10-CM

## 2021-12-02 NOTE — Telephone Encounter (Signed)
Called and spoke with patient who is calling because he is trying to change his cpap equipment. Patient states that he thought the nasal mask he has now would work but its not. Wants to go back to full face mask. Patient has not been seen in over a year and has been scheduled for OV with Dr. Ander Slade for first available. Next Appt ?With Pulmonology Laurin Coder, MD) ?12/17/2021 at 11:30 AM ? ? ?Please advise if you are ok with sending in order for him to change masks. ?

## 2021-12-03 NOTE — Telephone Encounter (Signed)
DME referral for CPAP supplies-new mask ? ? ?

## 2021-12-03 NOTE — Telephone Encounter (Signed)
I have placed the order per the provider approval. I called the patietn to let him know as well and had to leave a message per DPR. He was asked to call back with any questions.  ?

## 2021-12-17 ENCOUNTER — Ambulatory Visit: Payer: Medicare PPO | Admitting: Pulmonary Disease

## 2021-12-17 ENCOUNTER — Encounter: Payer: Self-pay | Admitting: Pulmonary Disease

## 2021-12-17 VITALS — BP 116/74 | HR 64 | Ht 66.0 in | Wt 191.0 lb

## 2021-12-17 DIAGNOSIS — Z9989 Dependence on other enabling machines and devices: Secondary | ICD-10-CM | POA: Diagnosis not present

## 2021-12-17 DIAGNOSIS — G4733 Obstructive sleep apnea (adult) (pediatric): Secondary | ICD-10-CM | POA: Diagnosis not present

## 2021-12-17 NOTE — Progress Notes (Signed)
? ?      ?Douglas Lane    [id number]    [birth year] ? ?Primary Care Physician:Ross, Dwyane Luo, MD ? ?Referring Physician: Lawerance Cruel, MD ?[address] ?[place],  [place] [address] ? ?Chief complaint:   ?Patient being seen for obstructive sleep apnea ? ?HPI: ? ?Recently had a home sleep study performed revealing severe obstructive sleep apnea ? ?Has been using CPAP, trying to use it on a nightly basis ?-Still does not like using CPAP ?has to make mask changes, current mask feels a little bit better ? ?Does not wake up feeling like is at a good nights rest on most nights ?Trying to use the machine for at least 4 hours every night ?Sometimes when he wakes up in the middle of the night, will not replace CPAP ? ?Memory is fine ?No morning headaches ?No dryness of his mouth in the mornings ? ?No family history of obstructive sleep apnea ? ?Usually goes to bed between 1030 and 1130 ?Falls asleep easily ?3-4 awakenings ? ?Final wake up for between 8 and 9 AM ? ?Weight is stable from last visit ? ?Has a history of coronary artery disease status post stenting in the past ?Has hypertension ? ?Outpatient Encounter Medications as of 12/17/2021  ?Medication Sig  ? amLODipine (NORVASC) 5 MG tablet TAKE 1 TABLET(5 MG) BY MOUTH DAILY  ? aspirin EC 81 MG tablet Take 81 mg by mouth daily. Swallow whole.  ? calcium carbonate (TUMS - DOSED IN MG ELEMENTAL CALCIUM) 500 MG chewable tablet Chew 1 tablet by mouth daily as needed for heartburn.   ? chlorthalidone (HYGROTON) 25 MG tablet Take 1 tablet (25 mg total) by mouth daily.  ? clobetasol ointment (TEMOVATE) 2.29 % Apply 1 application topically 2 (two) times daily as needed (for psoriasis).  ? clonazePAM (KLONOPIN) 0.5 MG tablet Take 0.5 mg by mouth 2 (two) times daily as needed for anxiety.  ? CREON 36000-114000 units CPEP capsule Take 2 capsules by mouth 3 (three) times daily after meals.  ? desonide (DESOWEN) 0.05 % ointment Apply 1 application topically daily as  needed (psoriasis).   ? diphenhydrAMINE (BENADRYL) 25 mg capsule Take 25 mg by mouth daily as needed for allergies.  ? ezetimibe (ZETIA) 10 MG tablet Take 1 tablet (10 mg total) by mouth daily.  ? HUMIRA PEN 40 MG/0.8ML PNKT Inject 40 mg into the skin See admin instructions. Inject 40 mg SQ every 6 weeks  ? ibuprofen (ADVIL) 200 MG tablet Take 200 mg by mouth every 6 (six) hours as needed.  ? levETIRAcetam (KEPPRA) 750 MG tablet Take 1 tablet (750 mg total) by mouth 2 (two) times daily.  ? potassium chloride (KLOR-CON) 10 MEQ tablet Take 1 tablet (10 mEq total) by mouth 2 (two) times daily.  ? PROCTOZONE-HC 2.5 % rectal cream Apply 1 application topically daily as needed for hemorrhoids or itching.   ? rosuvastatin (CRESTOR) 40 MG tablet TAKE 1 TABLET(40 MG) BY MOUTH DAILY  ? sertraline (ZOLOFT) 50 MG tablet Take by mouth.  ? shark liver oil-cocoa butter (PREPARATION H) 0.25-3-85.5 % suppository Place 1 suppository rectally as needed for hemorrhoids.  ? [DISCONTINUED] clobetasol (TEMOVATE) 0.05 % external solution Apply 1 application topically 2 (two) times daily as needed (for psoriasis). (Patient not taking: Reported on 06/17/2021)  ? ?No facility-administered encounter medications on file as of 12/17/2021.  ? ? ?Allergies as of 12/17/2021  ? (No Known Allergies)  ? ? ?Past Medical History:  ?Diagnosis Date  ?  Aortic atherosclerosis ([place]) 03/08/2017  ? CT  ? Aortic stenosis   ? severe AS s/p TAVR (03/22/17) with a 59m Edwards Sapien 3 THV via the TF approach  ? Arthritis   ? "pain in my knees" (03/30/2016)  ? Bilateral cataracts   ? CAD S/P percutaneous coronary angioplasty 03/31/2016  ? a. CP/USA -> LHC 03/31/16: 90% prox-mid RCA s/p DES, 40% prox-mid LAD, 25% D2, 25% prox-mid Cx, elevated LVEDP 33.  ? Chronic diastolic CHF (congestive heart failure) ([place] 03/31/2016  ? pt unaware, has family history  ? Chronic lower back pain   ? DDD (degenerative disc disease), lumbar   ? Dyslipidemia   ? Grade II diastolic  dysfunction   ? Hearing loss   ? Heart murmur   ? Heartburn   ? occ. takes TUMS  ? Humeral fracture 07/2014  ? Right  ? Kidney cysts   ? 1.6 cm simple cyst right  ? LVH (left ventricular hypertrophy)   ? a. severe basal septal hypertrophy by echo 03/2016.  ? Mitral regurgitation   ? a. mild by echo 03/2016.  ? Osteopenia 03/13/2009  ? Psoriasis   ? Rheumatic fever   ? as a child  ? Scoliosis 03/13/2009  ? Seizure ([place] 01/2011; 07/2011; 07/2015  ? "idiopathic; been on Kepra since 07/2011", last seizure poss. 07/2015, absent seizure  ? Sinus bradycardia - baseline HR 50s at times.   ? 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 skin-Mohr's   ? Thrombosed hemorrhoids   ? ? ?Past Surgical History:  ?Procedure Laterality Date  ? APPENDECTOMY    ? BACK SURGERY    ? CARDIAC CATHETERIZATION N/A 03/30/2016  ? Procedure: Right/Left Heart Cath and Coronary Angiography;  Surgeon: JJettie Booze MD;  [facility];  Service: Cardiovascular;  Laterality: N/A;  ? CARDIAC CATHETERIZATION N/A 03/30/2016  ? Procedure: Coronary Stent Intervention;  Surgeon: JJettie Booze MD;  [facility];  Service: Cardiovascular;  Laterality: N/A;  ? CATARACT EXTRACTION, BILATERAL    ? COLONOSCOPY    ? CORONARY ANGIOPLASTY    ? FOREARM FRACTURE SURGERY Right ~ 2003-2004  ? "got steel plate in there"  ? HERNIA REPAIR  2009  ? I&D hemorrhoirds    ? KNEE ARTHROSCOPY Bilateral   ? MOHS SURGERY Left   ? temple  ? ORIF SCAPULAR FRACTURE Right   ? right  ? POSTERIOR LUMBAR FUSION  2004  ? TEE WITHOUT CARDIOVERSION N/A 11/01/2016  ? Procedure: TRANSESOPHAGEAL ECHOCARDIOGRAM (TEE);  Surgeon: MSanda Klein MD;  [facility]  Service: Cardiovascular;  Laterality: N/A;  ? TEE WITHOUT CARDIOVERSION N/A 03/22/2017  ? Procedure: TRANSESOPHAGEAL ECHOCARDIOGRAM (TEE);  Surgeon: CSherren Mocha MD;  [facility]  Service: Open Heart Surgery;  Laterality: N/A;  ? TONSILLECTOMY    ? TOTAL KNEE ARTHROPLASTY  Right 07/24/2018  ? Procedure: RIGHT TOTAL KNEE ARTHROPLASTY;  Surgeon: AGaynelle Arabian MD;  [facility];  Service: Orthopedics;  Laterality: Right;  571m  ? TOTAL KNEE ARTHROPLASTY Left 06/11/2019  ? Procedure: TOTAL KNEE ARTHROPLASTY;  Surgeon: AlGaynelle ArabianMD;  [facility];  Service: Orthopedics;  Laterality: Left;  5053m ? TRANSCATHETER AORTIC VALVE REPLACEMENT, TRANSFEMORAL N/A 03/22/2017  ? Procedure: TRANSCATHETER AORTIC VALVE REPLACEMENT, TRANSFEMORAL;  Surgeon: CooSherren MochaD;  [facility];  Laterality: N/A;  ? ? ?Family History  ?Problem Relation Age of Onset  ? Renal Disease Mother   ?  Congestive Heart Failure Father   ? Depression Sister   ? ? ?Social History  ? ?Socioeconomic History  ? Marital status: Married  ?  Spouse name: Not on file  ? Number of children: 1  ? Years of education: Not on file  ? Highest education level: Not on file  ?Occupational History  ? Not on file  ?Tobacco Use  ? Smoking status: Never  ? Smokeless tobacco: Never  ?Vaping Use  ? Vaping Use: Never used  ?Substance and Sexual Activity  ? Alcohol use: Yes  ?  Alcohol/week: 2.0 standard drinks  ?  Types: 1 Cans of beer, 1 Shots of liquor per week  ?  Comment: daily  ? Drug use: No  ? Sexual activity: Not Currently  ?Other Topics Concern  ? Not on file  ?Social History Narrative  ? Lives with wife and son in two story home  ?   ? Right handed  ?   ? Master's degree  ?   ? Drinks caffeine (Coffee) daily  ? ?Social Determinants of Health  ? ?Financial Resource Strain: Not on file  ?Food Insecurity: Not on file  ?Transportation Needs: Not on file  ?Physical Activity: Not on file  ?Stress: Not on file  ?Social Connections: Not on file  ?Intimate Partner Violence: Not on file  ? ? ?Review of Systems  ?Constitutional:  Positive for fatigue.  ?Respiratory:  Positive for apnea. Negative for shortness of breath.   ?Psychiatric/Behavioral:  Positive for sleep disturbance.   ? ?Vitals:  ?  12/17/21 1133  ?BP: 116/74  ?Pulse: 64  ?SpO2: 96%  ? ? ? ?Physical Exam ?Constitutional:   ?   Appearance: He is obese.  ?HENT:  ?   Head: Normocephalic and atraumatic.  ?Cardiovascular:  ?   Rate and Rhythm: Normal r

## 2021-12-17 NOTE — Patient Instructions (Signed)
Continue using CPAP on a nightly basis ? ?Call us with significant concerns ? ?I will see you back in a year ?

## 2021-12-18 ENCOUNTER — Encounter: Payer: Self-pay | Admitting: Pulmonary Disease

## 2021-12-18 DIAGNOSIS — G4733 Obstructive sleep apnea (adult) (pediatric): Secondary | ICD-10-CM

## 2022-01-06 DIAGNOSIS — M25571 Pain in right ankle and joints of right foot: Secondary | ICD-10-CM | POA: Diagnosis not present

## 2022-01-22 DIAGNOSIS — S40861A Insect bite (nonvenomous) of right upper arm, initial encounter: Secondary | ICD-10-CM | POA: Diagnosis not present

## 2022-01-22 DIAGNOSIS — S40862A Insect bite (nonvenomous) of left upper arm, initial encounter: Secondary | ICD-10-CM | POA: Diagnosis not present

## 2022-01-22 DIAGNOSIS — W57XXXA Bitten or stung by nonvenomous insect and other nonvenomous arthropods, initial encounter: Secondary | ICD-10-CM | POA: Diagnosis not present

## 2022-02-01 ENCOUNTER — Encounter: Payer: Self-pay | Admitting: Pulmonary Disease

## 2022-02-02 DIAGNOSIS — D3132 Benign neoplasm of left choroid: Secondary | ICD-10-CM | POA: Diagnosis not present

## 2022-02-03 ENCOUNTER — Encounter (INDEPENDENT_AMBULATORY_CARE_PROVIDER_SITE_OTHER): Payer: Self-pay | Admitting: Ophthalmology

## 2022-03-08 ENCOUNTER — Other Ambulatory Visit: Payer: Self-pay | Admitting: Sports Medicine

## 2022-03-08 ENCOUNTER — Ambulatory Visit
Admission: RE | Admit: 2022-03-08 | Discharge: 2022-03-08 | Disposition: A | Discharge status: Home / Self Care | Payer: Medicare PPO | Source: Ambulatory Visit | Attending: Sports Medicine | Admitting: Sports Medicine

## 2022-03-08 DIAGNOSIS — M4184 Other forms of scoliosis, thoracic region: Secondary | ICD-10-CM | POA: Diagnosis not present

## 2022-03-08 DIAGNOSIS — M546 Pain in thoracic spine: Secondary | ICD-10-CM | POA: Diagnosis not present

## 2022-03-08 DIAGNOSIS — M47814 Spondylosis without myelopathy or radiculopathy, thoracic region: Secondary | ICD-10-CM | POA: Diagnosis not present

## 2022-03-24 DIAGNOSIS — R197 Diarrhea, unspecified: Secondary | ICD-10-CM | POA: Diagnosis not present

## 2022-03-29 DIAGNOSIS — L400 Psoriasis vulgaris: Secondary | ICD-10-CM | POA: Diagnosis not present

## 2022-03-29 DIAGNOSIS — Z85828 Personal history of other malignant neoplasm of skin: Secondary | ICD-10-CM | POA: Diagnosis not present

## 2022-03-29 DIAGNOSIS — D225 Melanocytic nevi of trunk: Secondary | ICD-10-CM | POA: Diagnosis not present

## 2022-03-29 DIAGNOSIS — L570 Actinic keratosis: Secondary | ICD-10-CM | POA: Diagnosis not present

## 2022-03-29 DIAGNOSIS — L821 Other seborrheic keratosis: Secondary | ICD-10-CM | POA: Diagnosis not present

## 2022-03-29 DIAGNOSIS — L820 Inflamed seborrheic keratosis: Secondary | ICD-10-CM | POA: Diagnosis not present

## 2022-03-29 DIAGNOSIS — D1801 Hemangioma of skin and subcutaneous tissue: Secondary | ICD-10-CM | POA: Diagnosis not present

## 2022-04-12 ENCOUNTER — Ambulatory Visit: Payer: Medicare PPO | Admitting: Neurology

## 2022-04-12 ENCOUNTER — Encounter: Payer: Self-pay | Admitting: Neurology

## 2022-04-12 VITALS — BP 133/77 | HR 75 | Resp 20 | Ht 66.0 in | Wt 192.0 lb

## 2022-04-12 DIAGNOSIS — G40009 Localization-related (focal) (partial) idiopathic epilepsy and epileptic syndromes with seizures of localized onset, not intractable, without status epilepticus: Secondary | ICD-10-CM

## 2022-04-12 MED ORDER — LEVETIRACETAM 750 MG PO TABS: 750.0000 mg | ORAL_TABLET | Freq: Two times a day (BID) | ORAL | 3 refills | Status: DC

## 2022-04-12 NOTE — Progress Notes (Signed)
NEUROLOGY FOLLOW UP OFFICE NOTE  Douglas Lane [id number] [birth year]  HISTORY OF PRESENT ILLNESS: I had the pleasure of seeing Douglas Lane in follow-up in the neurology clinic on 04/12/2022.  The patient was last seen almost a year ago for recurrent seizures.  He is again accompanied by his wife Webb Silversmith who helps supplement the history today.  Records and images were personally reviewed where available. Since his last visit, Webb Silversmith reports he has had a couple of amnesia episodes in the past year. They are few and far between, he had one around 6 months ago, then another 2-3 weeks ago. She denies any staring/unresponsive episodes, he would be acting fine until he would ask repeated questions about what happened previously, he would not remember for a little while. There would be no other symptoms such as headache, dizziness, focal numbness/tingling/weakness, olfactory/gustatory hallucinations, or myoclonic jerks. They last around an hour, family convinces him to take a nap and he is fine after. He does not think they are seizures, his wife notes they are memory lapses but otherwise he if functioning fine with no other memory concerns. He denies getting lost driving, no missed medications or bill payments. He takes clonazepam usually every night unless it is too late (he has to space out with his other medications). His wife feels he sleeps a lot, he says "what do you mean?" She reports he is in bed for 12 hours but still naps during the day. She notes sleep is interrupted, he is up and down all night. He reports urination 1-2 times at night which he does not think is frequent. He has a CPAP machine but does not use it all night. He has a history of REM behaviors for many years, he fell out of bed last week with vivid dreams. [place] No further back pain.    History on Initial Assessment 08/20/2014: This is a 77 yo RH man with a history of 2 seizures in 2012 that occurred 6 months  apart, presenting after ER visit for a seizure last 08/03/14. The first seizure occurred in June 2012. He recalls it was a stressful period just soon after he retired. He had a nocturnal convulsion and was brought to [facility]. He was not started on medication initially as it was his first seizure. On December 2012, he had another nocturnal convulsion and was started on Keppra '500mg'$  BID with no further seizures for 3 years.  He and his neurologist in [facility] agreed to start weaning off Keppra, and this was completely discontinued the spring/summer of 2015. Around 2-1/2 years ago, he had also been reporting anxiety to his neurologist, and was started on clorazepate 7.'5mg'$ , weaned to 1/2 tab for 6 months, then discontinued 3 days prior to the most recent nocturnal seizure on 12/16 off AEDs. He was brought to [facility] ER, there is no bloodwork or drug screen for review, CT head unremarkable.He was found to have posterior dislocation and comminuted fracture of the right humeral head. He had soft tissue injury extending along the right anterior proximal arm. This was reduced in the ER and he continues to wear a sling. He was restarted on Keppra '500mg'$  BID with no side effects except for mild drowsiness.   His wife reports that since coming off the [place] last year, he has had 3 incidences of memory lapses, where he could not recall the last few hours. His wife reports that he would look  confused but he can function and drive. The last episode occurred 6 weeks ago, he did not recall being in church. His wife reports he was talking fine but had a different look about him. This lasted about 1-2 hours. He had neuropsychological testing for memory loss and was told he has "short-term memory problems."  In hindsight, when asked about an MRI done in January 2012, he had an episode where he had transient memory loss while he was at a [employer]. I personally reviewed MRI brain without contrast done which showed mild  chronic microvascular changes in the bilateral subcortical regions and right central pons.  He denies any episodes of staring/unresponsiveness, no olfactory/gustatory hallucinations, deja vu, rising epigastric sensation, focal numbness/tingling/weakness, myoclonic jerks. He has 1-4 drinks daily (beer or whisky), with no change in pattern for many years. He denies any sleep deprivation prior to the seizures, but reports these being stressful periods. He continues to have anxiety and has "always been a Research officer, trade [employer]."   Epilepsy Risk Factors:  He had a normal birth and early development.  There is no history of febrile convulsions, CNS infections such as meningitis/encephalitis, significant traumatic brain injury, neurosurgical procedures, or family history of seizures.   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 CP/USA -> LHC 03/31/16: 90% prox-mid RCA s/p DES, 40% prox-mid LAD, 25% D2, 25% prox-mid Cx, elevated LVEDP 33.   Chronic diastolic CHF (congestive heart failure) ([facility] 03/31/2016   pt unaware, has family history   Chronic lower back pain    DDD (degenerative disc disease), lumbar    Dyslipidemia    Grade II diastolic dysfunction    Hearing loss    Heart murmur    Heartburn    occ. takes TUMS   Humeral fracture 07/2014   Right   Kidney cysts    1.6 cm simple cyst right   LVH (left ventricular hypertrophy)    a. severe basal septal hypertrophy by echo 03/2016.   Mitral regurgitation    a. mild by echo 03/2016.   Osteopenia 03/13/2009   Psoriasis    Rheumatic fever    as a child   Scoliosis 03/13/2009   Seizure ([facility] 01/2011; 07/2011; 07/2015   "idiopathic; been on Kepra since 07/2011",  last seizure poss. 07/2015, absent seizure   Sinus bradycardia - baseline HR 50s at times.    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    Thrombosed hemorrhoids     MEDICATIONS: Current Outpatient Medications on File Prior to Visit  Medication Sig Dispense Refill   amLODipine (NORVASC) 5 MG tablet TAKE 1 TABLET(5 MG) BY MOUTH DAILY 90 tablet 3   aspirin EC 81 MG tablet Take 81 mg by mouth daily. Swallow whole.     calcium carbonate (TUMS - DOSED IN MG ELEMENTAL CALCIUM) 500 MG chewable tablet Chew 1 tablet by mouth daily as needed for heartburn.      chlorthalidone (HYGROTON) 25 MG tablet Take 1 tablet (25 mg total) by mouth daily. 90 tablet 3   clobetasol ointment (TEMOVATE) 07.61% Apply 1 application topically 2 (two) times daily as needed (for psoriasis).  clonazePAM (KLONOPIN) 0.5 MG tablet Take 0.5 mg by mouth 2 (two) times daily as needed for anxiety.     desonide (DESOWEN) 0.05 % ointment Apply 1 application topically daily as needed (psoriasis).      ezetimibe (ZETIA) 10 MG tablet Take 1 tablet (10 mg total) by mouth daily. 90 tablet 3   ibuprofen (ADVIL) 200 MG tablet Take 200 mg by mouth every 6 (six) hours as needed.     levETIRAcetam (KEPPRA) 750 MG tablet Take 1 tablet (750 mg total) by mouth 2 (two) times daily. 180 tablet 3   potassium chloride (KLOR-CON) 10 MEQ tablet Take 1 tablet (10 mEq total) by mouth 2 (two) times daily. 180 tablet 3   PROCTOZONE-HC 2.5 % rectal cream Apply 1 application topically daily as needed for hemorrhoids or itching.      rosuvastatin (CRESTOR) 40 MG tablet TAKE 1 TABLET(40 MG) BY MOUTH DAILY 30 tablet 11   sertraline (ZOLOFT) 50 MG tablet Take by mouth.     shark liver oil-cocoa butter (PREPARATION H) 0.25-3-85.5 % suppository Place 1 suppository rectally as needed for hemorrhoids.     CREON 36000-114000 units CPEP capsule Take 2 capsules by mouth 3 (three) times daily after meals.     diphenhydrAMINE (BENADRYL) 25 mg  capsule Take 25 mg by mouth daily as needed for allergies.     HUMIRA PEN 40 MG/0.8ML PNKT Inject 40 mg into the skin See admin instructions. Inject 40 mg SQ every 6 weeks     No current facility-administered medications on file prior to visit.    ALLERGIES: No Known Allergies  FAMILY HISTORY: Family History  Problem Relation Age of Onset   Renal Disease Mother    Congestive Heart Failure Father    Depression Sister     SOCIAL HISTORY: Social History   Socioeconomic History   Marital status: Married    Spouse name: Not on file   Number of children: 1   Years of education: Not on file   Highest education level: Not on file  Occupational History   Not on file  Tobacco Use   Smoking status: Never   Smokeless tobacco: Never  Vaping Use   Vaping Use: Never used  Substance and Sexual Activity   Alcohol use: Yes    Alcohol/week: 2.0 standard drinks of alcohol    Types: 1 Cans of beer, 1 Shots of liquor per week    Comment: daily   Drug use: No   Sexual activity: Not Currently  Other Topics Concern   Not on file  Social History Narrative   Lives with wife and son in two story home      Right handed      Master's degree      Drinks caffeine (Coffee) daily   Social Determinants of Health   Financial Resource Strain: Not on file  Food Insecurity: Not on file  Transportation Needs: Not on file  Physical Activity: Not on file  Stress: Not on file  Social Connections: Not on file  Intimate Partner Violence: Not on file     PHYSICAL EXAM: Vitals:   04/12/22 0951  BP: 133/77  Pulse: 75  Resp: 20  SpO2: 95%   General: No acute distress Head:  Normocephalic/atraumatic Skin/Extremities: No rash, no edema Neurological Exam: alert and awake. No aphasia or dysarthria. Fund of knowledge is appropriate.  Attention and concentration are normal.   Cranial nerves: Pupils equal, round. Extraocular movements intact with no nystagmus. Visual fields full.  No facial  asymmetry.  Motor: Bulk and tone normal, no cogwheeling, muscle strength 5/5 throughout with no pronator drift.   Finger to nose testing intact.  Gait narrow-based and steady, no ataxia. No tremors.    IMPRESSION: This is a 76 yo RH man with a history of 2 convulsive seizures in 2012. After being seizure-free for 3 years, Keppra was tapered off in 2015. He was also taking clorazepate for anxiety and this was tapered off 3 days prior to a nocturnal seizure in 07/2014. No further convulsions since 2015, however his wife was reporting episodes of loss of time while off Keppra. He has been back on Levetiracetam for several years with no convulsions however his wife reports the episodes of amnesia/lost time occur around 2 times a year. He is hesitant to increase Levetiracetam due to pill size. Briviact is an option. We discussed doing an EEG. If abnormal, would discuss either increasing Levetiracetam dose to '1000mg'$  BID or switching to Briviact. He would like to stay on Levetiracetam '750mg'$  BID for now, refills sent. Continue follow-up with Sleep Medicine for CPAP/sleep difficulties. He is aware of [place] driving laws to stop driving after a seizure until 6 months seizure-free. Follow-up in 1 year, call for any changes.    Thank you for allowing me to participate in his care.  Please do not hesitate to call for any questions or concerns.    Ellouise Newer, M.D.   CC: Dr. Harrington Challenger

## 2022-04-12 NOTE — Patient Instructions (Signed)
Good to see you.  Schedule EEG. Our office will call with results and if we need to do any changes  2. Continue Keppra '750mg'$  twice a day, refills sent  3. Continue to monitor symptoms, call for any changes  4. Follow-up in 1 year   Seizure Precautions: 1. If medication has been prescribed for you to prevent seizures, take it exactly as directed.  Do not stop taking the medicine without talking to your doctor first, even if you have not had a seizure in a long time.   2. Avoid activities in which a seizure would cause danger to yourself or to others.  Don't operate dangerous machinery, swim alone, or climb in high or dangerous places, such as on ladders, roofs, or girders.  Do not drive unless your doctor says you may.  3. If you have any warning that you may have a seizure, lay down in a safe place where you can't hurt yourself.    4.  No driving for 6 months from last seizure, as per [facility].   Please refer to the following link on the [place] [facility] website for more information: http://[email or web address]/[email or web address]/drivingu.cfm   5.  Maintain good sleep hygiene. Avoid alcohol.  6.  Contact your doctor if you have any problems that may be related to the medicine you are taking.  7.  Call 911 and bring the patient back to the ED if:        A.  The seizure lasts longer than 5 minutes.       B.  The patient doesn't awaken shortly after the seizure  C.  The patient has new problems such as difficulty seeing, speaking or moving  D.  The patient was injured during the seizure  E.  The patient has a temperature over 102 F (39C)  F.  The patient vomited and now is having trouble breathing

## 2022-04-15 DIAGNOSIS — Z79899 Other long term (current) drug therapy: Secondary | ICD-10-CM | POA: Diagnosis not present

## 2022-04-15 DIAGNOSIS — E7800 Pure hypercholesterolemia, unspecified: Secondary | ICD-10-CM | POA: Diagnosis not present

## 2022-04-20 ENCOUNTER — Encounter (INDEPENDENT_AMBULATORY_CARE_PROVIDER_SITE_OTHER): Payer: Medicare PPO | Admitting: Ophthalmology

## 2022-04-22 DIAGNOSIS — Z23 Encounter for immunization: Secondary | ICD-10-CM | POA: Diagnosis not present

## 2022-04-22 DIAGNOSIS — F439 Reaction to severe stress, unspecified: Secondary | ICD-10-CM | POA: Diagnosis not present

## 2022-04-22 DIAGNOSIS — E7800 Pure hypercholesterolemia, unspecified: Secondary | ICD-10-CM | POA: Diagnosis not present

## 2022-04-22 DIAGNOSIS — M169 Osteoarthritis of hip, unspecified: Secondary | ICD-10-CM | POA: Diagnosis not present

## 2022-04-22 DIAGNOSIS — Z0000 Encounter for general adult medical examination without abnormal findings: Secondary | ICD-10-CM | POA: Diagnosis not present

## 2022-05-11 ENCOUNTER — Encounter (HOSPITAL_BASED_OUTPATIENT_CLINIC_OR_DEPARTMENT_OTHER): Payer: Self-pay | Admitting: Cardiology

## 2022-05-11 ENCOUNTER — Ambulatory Visit (INDEPENDENT_AMBULATORY_CARE_PROVIDER_SITE_OTHER): Payer: Medicare PPO | Admitting: Ophthalmology

## 2022-05-11 ENCOUNTER — Encounter (INDEPENDENT_AMBULATORY_CARE_PROVIDER_SITE_OTHER): Payer: Self-pay | Admitting: Ophthalmology

## 2022-05-11 DIAGNOSIS — D3132 Benign neoplasm of left choroid: Secondary | ICD-10-CM

## 2022-05-11 NOTE — Progress Notes (Signed)
05/11/2022     CHIEF COMPLAINT Patient presents for  Chief Complaint  Patient presents with   Retina Follow Up   Choroidal nevus left eye   HISTORY OF PRESENT ILLNESS: Douglas Lane is a 76 y.o. male who presents to the clinic today for:   HPI     Retina Follow Up           Diagnosis: Other   Laterality: left eye   Severity: moderate   Course: stable         Comments   6 MOS FU OU OCT. Pt stated vision is stable.       Last edited by Silvestre Moment on 05/11/2022  9:24 AM.      Referring physician: Lawerance Cruel, MD [place],  [place] [id number]  HISTORICAL INFORMATION:   Selected notes from the MEDICAL RECORD NUMBER    Lab Results  Component Value Date   HGBA1C 5.5 03/17/2017     CURRENT MEDICATIONS: No current outpatient medications on file. (Ophthalmic Drugs)   No current facility-administered medications for this visit. (Ophthalmic Drugs)   Current Outpatient Medications (Other)  Medication Sig   amLODipine (NORVASC) 5 MG tablet TAKE 1 TABLET(5 MG) BY MOUTH DAILY   aspirin EC 81 MG tablet Take 81 mg by mouth daily. Swallow whole.   calcium carbonate (TUMS - DOSED IN MG ELEMENTAL CALCIUM) 500 MG chewable tablet Chew 1 tablet by mouth daily as needed for heartburn.    chlorthalidone (HYGROTON) 25 MG tablet Take 1 tablet (25 mg total) by mouth daily.   clobetasol ointment (TEMOVATE) 9.32 % Apply 1 application topically 2 (two) times daily as needed (for psoriasis).   clonazePAM (KLONOPIN) 0.5 MG tablet Take 0.5 mg by mouth 2 (two) times daily as needed for anxiety.   CREON 36000-114000 units CPEP capsule Take 2 capsules by mouth 3 (three) times daily after meals.   desonide (DESOWEN) 0.05 % ointment Apply 1 application topically daily as needed (psoriasis).    diphenhydrAMINE (BENADRYL) 25 mg capsule Take 25 mg by mouth daily as needed for allergies.   ezetimibe (ZETIA) 10 MG tablet Take 1 tablet (10 mg total) by mouth daily.   HUMIRA  PEN 40 MG/0.8ML PNKT Inject 40 mg into the skin See admin instructions. Inject 40 mg SQ every 6 weeks   ibuprofen (ADVIL) 200 MG tablet Take 200 mg by mouth every 6 (six) hours as needed.   levETIRAcetam (KEPPRA) 750 MG tablet Take 1 tablet (750 mg total) by mouth 2 (two) times daily.   potassium chloride (KLOR-CON) 10 MEQ tablet Take 1 tablet (10 mEq total) by mouth 2 (two) times daily.   PROCTOZONE-HC 2.5 % rectal cream Apply 1 application topically daily as needed for hemorrhoids or itching.    rosuvastatin (CRESTOR) 40 MG tablet TAKE 1 TABLET(40 MG) BY MOUTH DAILY   sertraline (ZOLOFT) 50 MG tablet Take by mouth.   shark liver oil-cocoa butter (PREPARATION H) 0.25-3-85.5 % suppository Place 1 suppository rectally as needed for hemorrhoids.   No current facility-administered medications for this visit. (Other)      REVIEW OF SYSTEMS: ROS   Negative for: Constitutional, Gastrointestinal, Neurological, Skin, Genitourinary, Musculoskeletal, HENT, Endocrine, Cardiovascular, Eyes, Respiratory, Psychiatric, Allergic/Imm, Heme/Lymph Last edited by Silvestre Moment on 05/11/2022  9:24 AM.       ALLERGIES No Known Allergies  PAST MEDICAL HISTORY Past Medical History:  Diagnosis Date   Aortic atherosclerosis ([place]) 03/08/2017   CT  Aortic stenosis    severe AS s/p TAVR (03/22/17) with a 1m Edwards Sapien 3 THV via the TF approach   Arthritis    "pain in my knees" (03/30/2016)   Bilateral cataracts    CAD S/P percutaneous coronary angioplasty 03/31/2016   a. CP/USA -> LHC 03/31/16: 90% prox-mid RCA s/p DES, 40% prox-mid LAD, 25% D2, 25% prox-mid Cx, elevated LVEDP 33.   Chronic diastolic CHF (congestive heart failure) ([facility] 03/31/2016   pt unaware, has family history   Chronic lower back pain    DDD (degenerative disc disease), lumbar    Dyslipidemia    Grade II diastolic dysfunction    Hearing loss    Heart murmur    Heartburn    occ. takes TUMS   Humeral fracture 07/2014   Right    Kidney cysts    1.6 cm simple cyst right   LVH (left ventricular hypertrophy)    a. severe basal septal hypertrophy by echo 03/2016.   Mitral regurgitation    a. mild by echo 03/2016.   Osteopenia 03/13/2009   Psoriasis    Rheumatic fever    as a child   Scoliosis 03/13/2009   [place] [place]; 07/2015   "idiopathic; been on Kepra since 07/2011", last seizure poss. 07/2015, absent seizure   Sinus bradycardia - baseline HR 50s at times.    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    Thrombosed hemorrhoids    Past Surgical History:  Procedure Laterality Date   APPENDECTOMY     BACK SURGERY     CARDIAC CATHETERIZATION N/A 03/30/2016   Procedure: Right/Left Heart Cath and Coronary Angiography;  Surgeon: JJettie Booze MD;  [facility];  Service: Cardiovascular;  Laterality: N/A;   CARDIAC CATHETERIZATION N/A 03/30/2016   Procedure: Coronary Stent Intervention;  Surgeon: JJettie Booze MD;  [facility];  Service: Cardiovascular;  Laterality: N/A;   CATARACT EXTRACTION, BILATERAL     COLONOSCOPY     CORONARY ANGIOPLASTY     FOREARM FRACTURE SURGERY Right ~ 2003-2004   "got steel plate in there"   HERNIA REPAIR  2009   I&D hemorrhoirds     KNEE ARTHROSCOPY Bilateral    MOHS SURGERY Left    temple   ORIF SCAPULAR FRACTURE Right    right   POSTERIOR LUMBAR FUSION  2004   TEE WITHOUT CARDIOVERSION N/A 11/01/2016   Procedure: TRANSESOPHAGEAL ECHOCARDIOGRAM (TEE);  Surgeon: MSanda Klein MD;  [facility];  Service: Cardiovascular;  Laterality: N/A;   TEE WITHOUT CARDIOVERSION N/A 03/22/2017   Procedure: TRANSESOPHAGEAL ECHOCARDIOGRAM (TEE);  Surgeon: CSherren Mocha MD;  [facility]  Service: Open Heart Surgery;  Laterality: N/A;   TONSILLECTOMY     TOTAL KNEE ARTHROPLASTY Right 07/24/2018   Procedure: RIGHT TOTAL KNEE ARTHROPLASTY;  Surgeon: AGaynelle Arabian MD;  Location: [facility];  Service:  Orthopedics;  Laterality: Right;  539m   TOTAL KNEE ARTHROPLASTY Left 06/11/2019   Procedure: TOTAL KNEE ARTHROPLASTY;  Surgeon: AlGaynelle ArabianMD;  Location: [facility];  Service: Orthopedics;  Laterality: Left;  5078m  TRANSCATHETER AORTIC VALVE REPLACEMENT, TRANSFEMORAL N/A 03/22/2017   Procedure: TRANSCATHETER AORTIC VALVE REPLACEMENT, TRANSFEMORAL;  Surgeon: CooSherren MochaD;  [facility];  Laterality: N/A;    FAMILY HISTORY Family History  Problem Relation Age of Onset   Renal Disease Mother    Congestive Heart Failure Father  Depression Sister     SOCIAL HISTORY Social History   Tobacco Use   Smoking status: Never   Smokeless tobacco: Never  Vaping Use   Vaping Use: Never used  Substance Use Topics   Alcohol use: Yes    Alcohol/week: 2.0 standard drinks of alcohol    Types: 1 Cans of beer, 1 Shots of liquor per week    Comment: daily   Drug use: No         OPHTHALMIC EXAM:  Base Eye Exam     Visual Acuity (ETDRS)       Right Left   Dist cc 20/20 -1 20/30 -2 +2   Dist ph cc  NI    Correction: Glasses         Tonometry (Tonopen, 9:28 AM)       Right Left   Pressure 11 13         Pupils       Pupils APD   Right PERRL None   Left PERRL None         Visual Fields       Left Right    Full Full         Extraocular Movement       Right Left    Full, Ortho Full, Ortho         Neuro/Psych     Oriented x3: Yes   Mood/Affect: Normal         Dilation     Both eyes: 1.0% Mydriacyl, 2.5% Phenylephrine @ 9:28 AM           Slit Lamp and Fundus Exam     External Exam       Right Left   External Normal Normal         Slit Lamp Exam       Right Left   Lids/Lashes Normal Normal   Conjunctiva/Sclera White and quiet White and quiet   Cornea Clear Clear   Anterior Chamber Deep and quiet Deep and quiet   Iris Round and reactive Round and reactive   Lens Clear Clear   Anterior  Vitreous Normal Normal         Fundus Exam       Right Left   Posterior Vitreous Normal Normal   Disc Normal Normal   C/D Ratio 0.0 0.1   Macula Normal Normal yet posterior pole choroidal nevus, see comments under periphery   Vessels Normal Normal   Periphery Normal Choroidal nevus in the posterior pole approximately 3.5 x 3 disc diameters horizontal by vertical, with minor thickening noted and only 1 small area on the nasal aspect of the lesion of lipofuscin            IMAGING AND PROCEDURES  Imaging and Procedures for 05/11/22  OCT, Retina - OU - Both Eyes       Right Eye Quality was good. Scan locations included subfoveal. Progression has been stable. Findings include normal foveal contour.   Left Eye Quality was good. Scan locations included subfoveal. Central Foveal Thickness: 302. Progression has been stable. Findings include normal foveal contour.   Notes OS with incidental PVD     B-Scan Ultrasound - OS - Left Eye       Thickness of lesion temporatl to macula, 1.89 mm, no changes.  No subretinal fluid no exudates             ASSESSMENT/PLAN:  Choroidal nevus of left eye Stable over time.  No interval change, no fluid collection.  Thickness remained stable.  Follow-up with [facility] as scheduled.  I recommend patient be seen by experts in the field of choroidal nevi and some and monitoring their size because he has an intermediate size nevus which poses more than a 0 risk for developing malignant transformation.  So he does not want to see the folks at [facility].  Therefore I recommended that the patient be seen also in conjunction with [facility], tumor service or at [facility] Dr. Gerarda Fraction.  I explained the patient that this medium size nevus is outside my expertise for long-term follow-up and care alone  Risk factors for malignant transformation of a choroidal nevus: [  ]thickness greater than 3 mm,  [  ]subretinal  fluid,  [  ]symptoms,  [  ]orange pigment present,  [  ]margin within 3 mm of the optic disc,  [  ]ultrasonographic hollowness (versus solid/flat), medium internal reflectivity on scan [  ]absence of halo (A halo refers to a pigmented choroidal nevus surrounded by a circular band of depigmentation) [  ]absence of drusen [ x ]size larger than 23m basal diameter (over 3 dd size) [  ]observed growth  AJO: 202, June 2019, 100-108  The nature of choroidal nevus was discussed with the patient and an informational form was offered.  Photo documentation was discussed with the patient.  Periodic follow-up may be needed for a lifetime. The patient's questions were answered. At minimum, annual exams will be needed if no signs of early growth.       ICD-10-CM   1. Choroidal nevus of left eye  D31.32 OCT, Retina - OU - Both Eyes    B-Scan Ultrasound - OS - Left Eye      1.  OS, choroidal nevus.  No interval change over the last 2 years.  [facility]  I have reminded the patient that I will not take care of monitor this condition alone because of the intermediate size of the choroidal nevus which has more than a 0% chance of malignant transformation thus a second set of eyes continually following this is the most beneficial way to document if this process in the macular site remained stable and/or undergoes malignant transformation.  2.  The patient is asked whether and when and how many times we have to monitor it to see no change so that we do not have to be visiting and seeing this condition anymore than currently recommended.  I explained that the only way to know if there is no changes to see the lesion.  I explained to him that multiple cases over the literature have been it reported showing those patients that are "lost to follow-up and do not get monitor the ones that have a high risk of developing a tumor which if it metastasized is not  treatable  3.  I have therefore recommended second opinion with Dr. CGerarda Fraction  If he is not seen by someone in the next 6 months of the me I will not take care of him in the future.  Ophthalmic Meds Ordered this visit:  No orders of the defined types were placed in this encounter.      Return in about 6 months (around 11/09/2022) for dilate, OS, COLOR FP, B-Scan U/S.  There are no Patient Instructions on file for this visit.   Explained  the diagnoses, plan, and follow up with the patient and they expressed understanding.  Patient expressed understanding of the importance of proper follow up care.   Clent Demark Deepa Barthel M.D. Diseases & Surgery of the Retina and Vitreous Retina & Diabetic [facility] 05/11/22     Abbreviations: M myopia (nearsighted); A astigmatism; H hyperopia (farsighted); P presbyopia; Mrx spectacle prescription;  CTL contact lenses; OD right eye; OS left eye; OU both eyes  XT exotropia; ET esotropia; PEK punctate epithelial keratitis; PEE punctate epithelial erosions; DES dry eye syndrome; MGD meibomian gland dysfunction; ATs artificial tears; PFAT's preservative free artificial tears; [place] nuclear sclerotic cataract; PSC posterior subcapsular cataract; ERM epi-retinal membrane; PVD posterior vitreous detachment; RD retinal detachment; DM diabetes mellitus; DR diabetic retinopathy; NPDR non-proliferative diabetic retinopathy; PDR proliferative diabetic retinopathy; CSME clinically significant macular edema; DME diabetic macular edema; dbh dot blot hemorrhages; CWS cotton wool spot; POAG primary open angle glaucoma; C/D cup-to-disc ratio; HVF humphrey visual field; GVF goldmann visual field; OCT optical coherence tomography; IOP intraocular pressure; BRVO Branch retinal vein occlusion; CRVO central retinal vein occlusion; CRAO central retinal artery occlusion; BRAO branch retinal artery occlusion; RT retinal tear; SB scleral buckle; PPV pars plana vitrectomy; VH Vitreous  hemorrhage; PRP panretinal laser photocoagulation; IVK intravitreal kenalog; VMT vitreomacular traction; MH Macular hole;  NVD neovascularization of the disc; NVE neovascularization elsewhere; AREDS age related eye disease study; ARMD age related macular degeneration; POAG primary open angle glaucoma; EBMD epithelial/anterior basement membrane dystrophy; ACIOL anterior chamber intraocular lens; IOL intraocular lens; PCIOL posterior chamber intraocular lens; Phaco/IOL phacoemulsification with intraocular lens placement; [place] photorefractive keratectomy; LASIK laser assisted in situ keratomileusis; HTN hypertension; DM diabetes mellitus; COPD chronic obstructive pulmonary disease

## 2022-05-11 NOTE — Assessment & Plan Note (Addendum)
Stable over time.  No interval change, no fluid collection.  Thickness remained stable.  Follow-up with [facility] as scheduled.  I recommend patient be seen by experts in the field of choroidal nevi and some and monitoring their size because he has an intermediate size nevus which poses more than a 0 risk for developing malignant transformation.  So he does not want to see the folks at [facility].  Therefore I recommended that the patient be seen also in conjunction with [facility], tumor service or at [facility] Dr. Gerarda Fraction.  I explained the patient that this medium size nevus is outside my expertise for long-term follow-up and care alone  Risk factors for malignant transformation of a choroidal nevus: [  ]thickness greater than 3 mm,  [  ]subretinal fluid,  [  ]symptoms,  [  ]orange pigment present,  [  ]margin within 3 mm of the optic disc,  [  ]ultrasonographic hollowness (versus solid/flat), medium internal reflectivity on scan [  ]absence of halo (A halo refers to a pigmented choroidal nevus surrounded by a circular band of depigmentation) [  ]absence of drusen [ x ]size larger than 26m basal diameter (over 3 dd size) [  ]observed growth  AJO: 202, June 2019, 100-108  The nature of choroidal nevus was discussed with the patient and an informational form was offered.  Photo documentation was discussed with the patient.  Periodic follow-up may be needed for a lifetime. The patient's questions were answered. At minimum, annual exams will be needed if no signs of early growth.

## 2022-05-31 DIAGNOSIS — U071 COVID-19: Secondary | ICD-10-CM | POA: Diagnosis not present

## 2022-06-01 ENCOUNTER — Other Ambulatory Visit: Payer: Self-pay | Admitting: Neurology

## 2022-06-01 DIAGNOSIS — G40009 Localization-related (focal) (partial) idiopathic epilepsy and epileptic syndromes with seizures of localized onset, not intractable, without status epilepticus: Secondary | ICD-10-CM

## 2022-06-02 ENCOUNTER — Ambulatory Visit (HOSPITAL_COMMUNITY): Payer: Medicare PPO

## 2022-06-02 MED ORDER — LEVETIRACETAM 750 MG PO TABS: 750.0000 mg | ORAL_TABLET | Freq: Two times a day (BID) | ORAL | 0 refills | Status: DC

## 2022-06-07 DIAGNOSIS — N401 Enlarged prostate with lower urinary tract symptoms: Secondary | ICD-10-CM | POA: Diagnosis not present

## 2022-06-08 DIAGNOSIS — Z86010 Personal history of colonic polyps: Secondary | ICD-10-CM | POA: Diagnosis not present

## 2022-06-08 DIAGNOSIS — U071 COVID-19: Secondary | ICD-10-CM | POA: Diagnosis not present

## 2022-06-08 DIAGNOSIS — R195 Other fecal abnormalities: Secondary | ICD-10-CM | POA: Diagnosis not present

## 2022-06-09 ENCOUNTER — Ambulatory Visit (HOSPITAL_COMMUNITY)
Admission: RE | Admit: 2022-06-09 | Discharge: 2022-06-09 | Disposition: A | Discharge status: Home / Self Care | Payer: Medicare PPO | Source: Ambulatory Visit | Attending: Neurology | Admitting: Neurology

## 2022-06-09 DIAGNOSIS — G40009 Localization-related (focal) (partial) idiopathic epilepsy and epileptic syndromes with seizures of localized onset, not intractable, without status epilepticus: Secondary | ICD-10-CM | POA: Insufficient documentation

## 2022-06-09 NOTE — Progress Notes (Signed)
EEG complete - results pending 

## 2022-06-11 DIAGNOSIS — N401 Enlarged prostate with lower urinary tract symptoms: Secondary | ICD-10-CM | POA: Diagnosis not present

## 2022-06-11 DIAGNOSIS — R3912 Poor urinary stream: Secondary | ICD-10-CM | POA: Diagnosis not present

## 2022-06-11 NOTE — Procedures (Signed)
ELECTROENCEPHALOGRAM REPORT  Date of Study: 06/09/2022  Patient's Name: Douglas Lane MRN: [id number] Date of Birth: Jun 17, 1946  Referring Provider: Dr. Ellouise Newer  Clinical History: This is a 76 year old man with seizures and report of episodes of amnesia/lost time on Levetiracetam. EEG for classification.  Medications: Levetiracetam, Amlodipine, Hygroton, clonazepam, Zetia, Crestor, Zoloft  Technical Summary: A multichannel digital EEG recording measured by the international 10-20 system with electrodes applied with paste and impedances below 5000 ohms performed in our laboratory with EKG monitoring in an awake and asleep patient.  Hyperventilation was not performed. Photic stimulation was performed.  The digital EEG was referentially recorded, reformatted, and digitally filtered in a variety of bipolar and referential montages for optimal display.    Description: The patient is awake and asleep during the recording.  During maximal wakefulness, there is a symmetric, medium voltage 10 Hz posterior dominant rhythm that attenuates with eye opening.  The record is symmetric.  During drowsiness and sleep, there is an increase in theta slowing of the background.  Vertex waves and symmetric sleep spindles were seen. Photic stimulation did not elicit any abnormalities.  There were no epileptiform discharges or electrographic seizures seen.    EKG lead was unremarkable.  Impression: This awake and asleep EEG is normal.    Clinical Correlation: A normal EEG does not exclude a clinical diagnosis of epilepsy.  If further clinical questions remain, prolonged EEG may be helpful.  Clinical correlation is advised.   Ellouise Newer, M.D.

## 2022-06-15 ENCOUNTER — Ambulatory Visit: Payer: Medicare PPO | Admitting: Physician Assistant

## 2022-06-17 ENCOUNTER — Telehealth: Payer: Self-pay

## 2022-06-17 NOTE — Telephone Encounter (Signed)
-----   Message from Cameron Sprang, MD sent at 06/17/2022 12:11 PM EDT ----- Pls let him know the EEG was normal. We had discussed that if EEG is normal, stay on same medication. Please have him or wife call for any changes in symptoms, at which point would add on a second seizure medication as discussed in the office. Thanks

## 2022-06-17 NOTE — Telephone Encounter (Signed)
Pt called an informed that Dr Delice Lesch stated the EEG was normal. They  had discussed that if EEG is normal, stay on same medication. Please call for any changes in symptoms, at which point Dr Delice Lesch would add on a second seizure medication as discussed in the office

## 2022-06-22 ENCOUNTER — Ambulatory Visit: Payer: Medicare PPO | Admitting: Physician Assistant

## 2022-06-24 ENCOUNTER — Ambulatory Visit: Payer: Medicare PPO | Admitting: Physician Assistant

## 2022-06-24 ENCOUNTER — Other Ambulatory Visit: Payer: Self-pay | Admitting: Cardiology

## 2022-06-25 NOTE — Progress Notes (Addendum)
Cardiology Office Note:    Date:  06/29/2022   ID:  Douglas Lane, DOB 12-23-1945, MRN [id number]  PCP:  Daisy Floro, MD   [facility] Providers Cardiologist:  Armanda Magic, MD     Referring MD: Daisy Floro, MD   Chief Complaint: follow-up CAD, AS  History of Present Illness:    Douglas Lane is a pleasant 76 y.o. male with a hx of CAD s/p DES to RCA (03/2016) and 40% prox LAD and 50% ostial PDA, HTN, chronic HFpEF, severe AS s/p TAVR 03/2017 with a 26 mm Edwards Sapien 3 THV via the TF approach, HLD, and mild MR.  Most recent echo 10/2018 with normal LVEF 60-65%, diastolic dysfunction, normal RV, trivial MR, stable TAVR with trivial perivalvular regurgitation, mean gradient 11 mmHg, mild ascending aortic dilatation at 37 mm, mildly dilated LA.  Last cardiology clinic visit was 06/17/2021 with Dr. Mayford Knife at which time he was doing well. Recommendation for echo 10/2021 for 3 year follow-up TAVR.   Today, he is here alone for follow-up. Is feeling well from a cardiac perspective but has persistent diarrhea since returning from cruise to [place] 10/16. He and his wife all tested positive for COVID after returning. Not very symptomatic except for mild cough. Activity prior to trip was mostly yard work, did more walking in [place]. No SOB, chest pain, palpitations, presyncope, or syncope with activity. Was able to walk everywhere without a walking stick. He denies lower extremity edema, melena, hematuria, weakness, orthopnea, and PND. Reports mild fatigue that has been persistent for some time. Has an upcoming appointment with GI.   Past Medical History:  Diagnosis Date   Aortic atherosclerosis (HCC) 03/08/2017   CT   Aortic stenosis    severe AS s/p TAVR (03/22/17) with a 26mm Edwards Sapien 3 THV via the TF approach   Arthritis    "pain in my knees" (03/30/2016)   Bilateral cataracts    CAD S/P percutaneous coronary angioplasty 03/31/2016   a. CP/[place] -> LHC 03/31/16: 90%  prox-mid RCA s/p DES, 40% prox-mid LAD, 25% D2, 25% prox-mid Cx, elevated LVEDP 33.   Chronic diastolic CHF (congestive heart failure) (HCC) 03/31/2016   pt unaware, has family history   Chronic lower back pain    DDD (degenerative disc disease), lumbar    Dyslipidemia    Grade II diastolic dysfunction    Hearing loss    Heart murmur    Heartburn    occ. takes TUMS   Humeral fracture 07/2014   Right   Kidney cysts    1.6 cm simple cyst right   LVH (left ventricular hypertrophy)    a. severe basal septal hypertrophy by echo 03/2016.   Mitral regurgitation    a. mild by echo 03/2016.   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 07/2015, absent seizure   Sinus bradycardia - baseline HR 50s at times.    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    Thrombosed hemorrhoids     Past Surgical History:  Procedure Laterality Date   APPENDECTOMY     BACK SURGERY     CARDIAC CATHETERIZATION N/A 03/30/2016   Procedure: Right/Left Heart Cath and Coronary Angiography;  Surgeon: Corky Crafts, MD;  Location: [facility] INVASIVE CV LAB;  Service: Cardiovascular;  Laterality: N/A;   CARDIAC CATHETERIZATION N/A 03/30/2016   Procedure: Coronary Stent Intervention;  Surgeon: Corky Crafts, MD;  Location: [facility] INVASIVE CV LAB;  Service: Cardiovascular;  Laterality: N/A;   CATARACT EXTRACTION, BILATERAL     COLONOSCOPY     CORONARY ANGIOPLASTY     FOREARM FRACTURE SURGERY Right ~ 2003-2004   "got steel plate in there"   HERNIA REPAIR  2009   I&D hemorrhoirds     KNEE ARTHROSCOPY Bilateral    MOHS SURGERY Left    temple   ORIF SCAPULAR FRACTURE Right    right   POSTERIOR LUMBAR FUSION  2004   TEE WITHOUT CARDIOVERSION N/A 11/01/2016   Procedure: TRANSESOPHAGEAL ECHOCARDIOGRAM (TEE);  Surgeon: Thurmon Fair, MD;  Location:  [facility] ENDOSCOPY;  Service: Cardiovascular;  Laterality: N/A;   TEE WITHOUT CARDIOVERSION N/A 03/22/2017   Procedure: TRANSESOPHAGEAL ECHOCARDIOGRAM (TEE);  Surgeon: Tonny Bollman, MD;  Location: [facility] OR;  Service: Open Heart Surgery;  Laterality: N/A;   TONSILLECTOMY     TOTAL KNEE ARTHROPLASTY Right 07/24/2018   Procedure: RIGHT TOTAL KNEE ARTHROPLASTY;  Surgeon: Ollen Gross, MD;  Location: [facility] ORS;  Service: Orthopedics;  Laterality: Right;    TOTAL KNEE ARTHROPLASTY Left 06/11/2019   Procedure: TOTAL KNEE ARTHROPLASTY;  Surgeon: Ollen Gross, MD;  Location: [facility] ORS;  Service: Orthopedics;  Laterality: Left;    TRANSCATHETER AORTIC VALVE REPLACEMENT, TRANSFEMORAL N/A 03/22/2017   Procedure: TRANSCATHETER AORTIC VALVE REPLACEMENT, TRANSFEMORAL;  Surgeon: Tonny Bollman, MD;  Location: [facility] OR;  Service: Open Heart Surgery;  Laterality: N/A;    Current Medications: Current Meds  Medication Sig   acetaminophen (TYLENOL) 500 MG tablet as needed for headache.   aspirin EC 81 MG tablet Take 81 mg by mouth daily. Swallow whole.   calcium carbonate (TUMS - DOSED IN MG ELEMENTAL CALCIUM) 500 MG chewable tablet Chew 1 tablet by mouth daily as needed for heartburn.    clobetasol ointment (TEMOVATE) 0.05 % Apply 1 application topically 2 (two) times daily as needed (for psoriasis).   clonazePAM (KLONOPIN) 0.5 MG tablet Take 0.5 mg by mouth 2 (two) times daily as needed for anxiety.   colestipol (COLESTID) 1 g tablet Take 2 g by mouth at bedtime.   desonide (DESOWEN) 0.05 % ointment Apply 1 application topically daily as needed (psoriasis).    diphenhydrAMINE (BENADRYL) 25 mg capsule Take 25 mg by mouth daily as needed for allergies.   DULoxetine (CYMBALTA) 30 MG capsule Take 30 mg by mouth daily.   ibuprofen (ADVIL) 200 MG tablet Take 200 mg by mouth every 6 (six) hours as needed.   levETIRAcetam (KEPPRA) 750 MG tablet Take 1 tablet (750 mg total) by mouth 2 (two) times daily.   PROCTOZONE-HC  2.5 % rectal cream Apply 1 application topically daily as needed for hemorrhoids or itching.    shark liver oil-cocoa butter (PREPARATION H) 0.25-3-85.5 % suppository Place 1 suppository rectally as needed for hemorrhoids.   [DISCONTINUED] amLODipine (NORVASC) 5 MG tablet TAKE 1 TABLET(5 MG) BY MOUTH DAILY   [DISCONTINUED] chlorthalidone (HYGROTON) 25 MG tablet Take 1 tablet (25 mg total) by mouth daily.   [DISCONTINUED] ezetimibe (ZETIA) 10 MG tablet Take 1 tablet (10 mg total) by mouth daily.   [DISCONTINUED] potassium chloride (KLOR-CON) 10 MEQ tablet Take 1 tablet (10 mEq total) by mouth 2 (two) times daily.   [DISCONTINUED] rosuvastatin (CRESTOR) 40 MG tablet TAKE 1 TABLET(40 MG) BY MOUTH DAILY     Allergies:  Patient has no known allergies.   Social History   Socioeconomic History   Marital status: Married    Spouse name: Not on file   Number of children: 1   Years of education: Not on file   Highest education level: Not on file  Occupational History   Not on file  Tobacco Use   Smoking status: Never   Smokeless tobacco: Never  Vaping Use   Vaping Use: Never used  Substance and Sexual Activity   Alcohol use: Yes    Alcohol/week: 2.0 standard drinks of alcohol    Types: 1 Cans of beer, 1 Shots of liquor per week    Comment: daily   Drug use: No   Sexual activity: Not Currently  Other Topics Concern   Not on file  Social History Narrative   Lives with wife and son in two story home      Right handed      Master's degree      Drinks caffeine (Coffee) daily   Social Determinants of Health   Financial Resource Strain: Not on file  Food Insecurity: Not on file  Transportation Needs: Not on file  Physical Activity: Not on file  Stress: Not on file  Social Connections: Not on file     Family History: The patient's family history includes Congestive Heart Failure in his father; Depression in his sister; Renal Disease in his mother.  ROS:   Please see the  history of present illness.   + diarrhea All other systems reviewed and are negative.  Labs/Other Studies Reviewed:    The following studies were reviewed today:  Echo 10/26/2018  1. The left ventricle has normal systolic function with an ejection  fraction of 60-65%. The cavity size was normal. There is moderately  increased left ventricular wall thickness. Left ventricular diastolic  Doppler parameters are consistent with impaired   relaxation.   2. The right ventricle has normal systolic function. The cavity was  normal. There is no increase in right ventricular wall thickness.   3. No evidence of mitral valve stenosis. Trivial mitral regurgitation.   4. Bioprosthetic aortic valve s/p TAVR, 26 mm Edwards. Trivial  perivalvular regurgitation. Mean gradient 11 mmHg, not significantly  elevated.   5. There is mild dilatation of the ascending aorta measuring 37 mm.   6. Left atrial size was mildly dilated.   7. Trivial pericardial effusion is present.   8. The IVC was normal in size. No complete TR doppler jet so unable to  estimate PA systolic pressure.  R/LHC 03/30/2016  Prox RCA to Mid RCA lesion, 90 %stenosed. A STENT SYNERGY DES [id number] drug eluting stent was successfully placed. Post intervention, there is a 0% residual stenosis. LV end diastolic pressure is moderately elevated. There is moderate aortic valve stenosis. There is a large gradient across the aortic valve, but based on the echo and how easily the catheter crossed the valve, I suspect much of the gradient is subvalvular. There is no pulmonic valve stenosis. Cardiac index 2.6. Normal pulmonary artery pressures.   Continue dual antiplatelet therapy. He is a candidate for the TWILIGHT study.   I spoke with Dr. Mayford Knife during the case regarding the aortic stenosis. There is a large gradient present but is mostly subvalvular most likely. The pigtail catheter in JR catheter across the valve without even a wire.   He'll  need diuresis as well to help with his elevated LVEDP. Recent Labs: No results found for requested labs within  last 365 days.  Recent Lipid Panel    Component Value Date/Time   CHOL 125 03/21/2019 0746   TRIG 67 03/21/2019 0746   HDL 52 03/21/2019 0746   CHOLHDL 2.4 03/21/2019 0746   CHOLHDL 2.6 08/06/2016 0831   VLDL 16 08/06/2016 0831   LDLCALC 60 03/21/2019 0746     Risk Assessment/Calculations:      Physical Exam:    VS:  BP 126/70   Pulse 74   Ht 5\' 6"  (1.676 m)   Wt 195 lb (88.5 kg)   SpO2 94%   BMI 31.47 kg/m     Wt Readings from Last 3 Encounters:  06/29/22 195 lb (88.5 kg)  04/12/22 192 lb (87.1 kg)  12/17/21 191 lb (86.6 kg)     GEN: Well nourished, well developed in no acute distress HEENT: Normal NECK: No JVD; No carotid bruits CARDIAC: Distant heart sounds, RRR, no murmurs, rubs, gallops RESPIRATORY:  Clear to auscultation without rales, wheezing or rhonchi  ABDOMEN: Soft, non-tender, non-distended MUSCULOSKELETAL:  No edema; No deformity. 2+ pedal pulses, equal bilaterally SKIN: Warm and dry NEUROLOGIC:  Alert and oriented x 3 PSYCHIATRIC:  Normal affect   EKG:  EKG is ordered today.  The ekg ordered today demonstrates normal sinus rhythm at 74 bpm, left BBB, LAD, no acute change from previous tracing     Diagnoses:    1. Coronary artery disease involving native coronary artery of native heart without angina pectoris   2. Benign essential HTN   3. Severe aortic stenosis   4. Chronic diastolic heart failure (HCC)   5. S/P TAVR (transcatheter aortic valve replacement)    Assessment and Plan:     CAD without angina: Prior stent to prox to [facility] 03/2016. CCTA 01/2017 with minimal calcifications. He denies chest pain, dyspnea, or other symptoms concerning for angina.  No indication for further ischemic evaluation at this time.  No bleeding problems on aspirin.  Continue rosuvastatin, ezetimibe, aspirin, amlodipine.  Severe AS s/p TAVR 2018: Normal  heart function and normal valve function on most recent echo 10/2018. No specific concerns today, he is asymptomatic. Will repeat echo 10/2022 for follow-up of valve function. Has requested to return to cardiac rehab, Dr. Mayford Knife is in agreement and she has placed orders.   Chronic HFpEF: Elevated LVEDP  on cath 2017, diastolic dysfunction on echo 10/2018 with normal LVEF. Prior history of volume overload.  Appears euvolemic on exam today. No edema, orthopnea, dyspnea, or PND. Getting updated echo March 2024. Continue chlorthalidone.   Hyperlipidemia LDL goal < 70: LDL 63 on 04/15/22. Continue rosuvastatin, ezetimibe.   Hypertension: BP is well controlled. No medication changes today.      Disposition: 1 year with Dr. Mayford Knife  Medication Adjustments/Labs and Tests Ordered: Current medicines are reviewed at length with the patient today.  Concerns regarding medicines are outlined above.  Orders Placed This Encounter  Procedures   EKG 12-Lead   ECHOCARDIOGRAM COMPLETE   Meds ordered this encounter  Medications   amLODipine (NORVASC) 5 MG tablet    Sig: TAKE 1 TABLET(5 MG) BY MOUTH DAILY    Dispense:  90 tablet    Refill:  3   ezetimibe (ZETIA) 10 MG tablet    Sig: Take 1 tablet (10 mg total) by mouth daily.    Dispense:  90 tablet    Refill:  3   potassium chloride (KLOR-CON) 10 MEQ tablet    Sig: Take 1 tablet (10 mEq total) by mouth 2 (  two) times daily.    Dispense:  180 tablet    Refill:  3    Pt requesting smaller tablets   rosuvastatin (CRESTOR) 40 MG tablet    Sig: Take 1 tablet (40 mg total) by mouth daily.    Dispense:  90 tablet    Refill:  3   chlorthalidone (HYGROTON) 25 MG tablet    Sig: Take 1 tablet (25 mg total) by mouth daily.    Dispense:  90 tablet    Refill:  3    Patient Instructions  Medication Instructions:   Your physician recommends that you continue on your current medications as directed. Please refer to the Current Medication list given to you  today.   *If you need a refill on your cardiac medications before your next appointment, please call your pharmacy*   Lab Work:  None ordered.  If you have labs (blood work) drawn today and your tests are completely normal, you will receive your results only by: MyChart Message (if you have MyChart) OR A paper copy in the mail If you have any lab test that is abnormal or we need to change your treatment, we will call you to review the results.   Testing/Procedures:  Your physician has requested that you have an echocardiogram. Echocardiography is a painless test that uses sound waves to create images of your heart. It provides your doctor with information about the size and shape of your heart and how well your heart's chambers and valves are working. This procedure takes approximately one hour. There are no restrictions for this procedure. Please do NOT wear cologne, perfume, aftershave, or lotions (deodorant is allowed). Please arrive 15 minutes prior to your appointment time.    Follow-Up: At [facility], you and your health needs are our priority.  As part of our continuing mission to provide you with exceptional heart care, we have created designated Provider Care Teams.  These Care Teams include your primary Cardiologist (physician) and Advanced Practice Providers (APPs -  Physician Assistants and Nurse Practitioners) who all work together to provide you with the care you need, when you need it.  We recommend signing up for the patient portal called "MyChart".  Sign up information is provided on this After Visit Summary.  MyChart is used to connect with patients for Virtual Visits (Telemedicine).  Patients are able to view lab/test results, encounter notes, upcoming appointments, etc.  Non-urgent messages can be sent to your provider as well.   To learn more about what you can do with MyChart, go to [email or web address].    Your next appointment:   1 year(s)  The  format for your next appointment:   In Person  Provider:   Armanda Magic, MD     Other Instructions  Your physician wants you to follow-up in: 1 year with Dr. Mayford Knife.  You will receive a reminder letter in the mail two months in advance. If you don't receive a letter, please call our office to schedule the follow-up appointment.   Important Information About Sugar         Signed, Levi Aland, NP  06/29/2022 12:55 PM    [facility]

## 2022-06-27 ENCOUNTER — Other Ambulatory Visit: Payer: Self-pay | Admitting: Cardiology

## 2022-06-29 ENCOUNTER — Telehealth: Payer: Self-pay

## 2022-06-29 ENCOUNTER — Ambulatory Visit: Payer: Medicare PPO | Attending: Physician Assistant | Admitting: Nurse Practitioner

## 2022-06-29 ENCOUNTER — Encounter: Payer: Self-pay | Admitting: Nurse Practitioner

## 2022-06-29 VITALS — BP 126/70 | HR 74 | Ht 66.0 in | Wt 195.0 lb

## 2022-06-29 DIAGNOSIS — I350 Nonrheumatic aortic (valve) stenosis: Secondary | ICD-10-CM | POA: Diagnosis not present

## 2022-06-29 DIAGNOSIS — I2510 Atherosclerotic heart disease of native coronary artery without angina pectoris: Secondary | ICD-10-CM

## 2022-06-29 DIAGNOSIS — Z952 Presence of prosthetic heart valve: Secondary | ICD-10-CM | POA: Diagnosis not present

## 2022-06-29 DIAGNOSIS — I10 Essential (primary) hypertension: Secondary | ICD-10-CM | POA: Diagnosis not present

## 2022-06-29 DIAGNOSIS — I5032 Chronic diastolic (congestive) heart failure: Secondary | ICD-10-CM | POA: Diagnosis not present

## 2022-06-29 MED ORDER — CHLORTHALIDONE 25 MG PO TABS: 25.0000 mg | ORAL_TABLET | Freq: Every day | ORAL | 3 refills | Status: DC

## 2022-06-29 MED ORDER — AMLODIPINE BESYLATE 5 MG PO TABS: ORAL_TABLET | ORAL | 3 refills | Status: DC

## 2022-06-29 MED ORDER — ROSUVASTATIN CALCIUM 40 MG PO TABS: 40.0000 mg | ORAL_TABLET | Freq: Every day | ORAL | 3 refills | Status: DC

## 2022-06-29 MED ORDER — EZETIMIBE 10 MG PO TABS: 10.0000 mg | ORAL_TABLET | Freq: Every day | ORAL | 3 refills | Status: DC

## 2022-06-29 MED ORDER — POTASSIUM CHLORIDE ER 10 MEQ PO TBCR: 10.0000 meq | EXTENDED_RELEASE_TABLET | Freq: Two times a day (BID) | ORAL | 3 refills | Status: DC

## 2022-06-29 NOTE — Telephone Encounter (Signed)
Cardiac Rehab order placed per Dr Radford Pax.

## 2022-06-29 NOTE — Patient Instructions (Signed)
Medication Instructions:   Your physician recommends that you continue on your current medications as directed. Please refer to the Current Medication list given to you today.   *If you need a refill on your cardiac medications before your next appointment, please call your pharmacy*   Lab Work:  None ordered.  If you have labs (blood work) drawn today and your tests are completely normal, you will receive your results only by: [place] (if you have MyChart) OR A paper copy in the mail If you have any lab test that is abnormal or we need to change your treatment, we will call you to review the results.   Testing/Procedures:  Your physician has requested that you have an echocardiogram. Echocardiography is a painless test that uses sound waves to create images of your heart. It provides your doctor with information about the size and shape of your heart and how well your heart's chambers and valves are working. This procedure takes approximately one hour. There are no restrictions for this procedure. Please do NOT wear cologne, perfume, aftershave, or lotions (deodorant is allowed). Please arrive 15 minutes prior to your appointment time.    Follow-Up: At [facility], you and your health needs are our priority.  As part of our continuing mission to provide you with exceptional heart care, we have created designated Provider Care Teams.  These Care Teams include your primary Cardiologist (physician) and Advanced Practice Providers (APPs -  Physician Assistants and Nurse Practitioners) who all work together to provide you with the care you need, when you need it.  We recommend signing up for the patient portal called "MyChart".  Sign up information is provided on this After Visit Summary.  MyChart is used to connect with patients for Virtual Visits (Telemedicine).  Patients are able to view lab/test results, encounter notes, upcoming appointments, etc.  Non-urgent messages  can be sent to your provider as well.   To learn more about what you can do with MyChart, go to [email or web address].    Your next appointment:   1 year(s)  The format for your next appointment:   In Person  Provider:   Fransico Him, MD     Other Instructions  Your physician wants you to follow-up in: 1 year with Dr. Radford Pax.  You will receive a reminder letter in the mail two months in advance. If you don't receive a letter, please call our office to schedule the follow-up appointment.   Important Information About Sugar

## 2022-06-29 NOTE — Telephone Encounter (Signed)
-----   Message from Sueanne Margarita, MD sent at 06/29/2022  9:59 AM EST ----- I sent it to Triage ----- Message ----- From: Emmaline Life, NP Sent: 06/29/2022   9:44 AM EST To: Sueanne Margarita, MD; [address] Triage  The order has to be from you, APPs can't order. Thank you!  ----- Message ----- From: Sueanne Margarita, MD Sent: 06/29/2022   9:19 AM EST To: Emmaline Life, NP; [address] Triage  Please order maintenance cardiac rehab for patient ----- Message ----- From: Emmaline Life, NP Sent: 06/29/2022   9:15 AM EST To: Sueanne Margarita, MD  Patient would like to return to cardiac rehab. Could you please order?  Thank you, Sharyn Lull

## 2022-07-28 ENCOUNTER — Other Ambulatory Visit: Payer: Self-pay | Admitting: Cardiology

## 2022-07-28 DIAGNOSIS — I10 Essential (primary) hypertension: Secondary | ICD-10-CM

## 2022-09-08 ENCOUNTER — Encounter (HOSPITAL_BASED_OUTPATIENT_CLINIC_OR_DEPARTMENT_OTHER): Payer: Self-pay | Admitting: Cardiology

## 2022-09-09 DIAGNOSIS — Z961 Presence of intraocular lens: Secondary | ICD-10-CM | POA: Diagnosis not present

## 2022-09-09 DIAGNOSIS — H35372 Puckering of macula, left eye: Secondary | ICD-10-CM | POA: Diagnosis not present

## 2022-09-09 DIAGNOSIS — D3132 Benign neoplasm of left choroid: Secondary | ICD-10-CM | POA: Diagnosis not present

## 2022-09-09 MED ORDER — ROSUVASTATIN CALCIUM 40 MG PO TABS: 40.0000 mg | ORAL_TABLET | Freq: Every day | ORAL | 3 refills | Status: DC

## 2022-10-18 ENCOUNTER — Ambulatory Visit (HOSPITAL_COMMUNITY): Payer: Medicare PPO | Attending: Cardiology

## 2022-10-18 DIAGNOSIS — I350 Nonrheumatic aortic (valve) stenosis: Secondary | ICD-10-CM | POA: Diagnosis not present

## 2022-10-18 DIAGNOSIS — I10 Essential (primary) hypertension: Secondary | ICD-10-CM | POA: Insufficient documentation

## 2022-10-18 DIAGNOSIS — I2510 Atherosclerotic heart disease of native coronary artery without angina pectoris: Secondary | ICD-10-CM | POA: Diagnosis not present

## 2022-10-18 DIAGNOSIS — I5032 Chronic diastolic (congestive) heart failure: Secondary | ICD-10-CM | POA: Diagnosis not present

## 2022-10-18 LAB — ECHOCARDIOGRAM COMPLETE
AR max vel: 1.68 cm2
AV Area VTI: 1.65 cm2
AV Area mean vel: 1.62 cm2
AV Mean grad: 11 mmHg
AV Peak grad: 18.1 mmHg
Ao pk vel: 2.13 m/s
Area-P 1/2: 2.4 cm2
P 1/2 time: 546 msec
S' Lateral: 2.7 cm

## 2022-10-20 DIAGNOSIS — I77810 Thoracic aortic ectasia: Secondary | ICD-10-CM | POA: Diagnosis not present

## 2022-10-20 DIAGNOSIS — Z6830 Body mass index (BMI) 30.0-30.9, adult: Secondary | ICD-10-CM | POA: Diagnosis not present

## 2022-10-20 DIAGNOSIS — G40909 Epilepsy, unspecified, not intractable, without status epilepticus: Secondary | ICD-10-CM | POA: Diagnosis not present

## 2022-10-20 DIAGNOSIS — F439 Reaction to severe stress, unspecified: Secondary | ICD-10-CM | POA: Diagnosis not present

## 2022-10-20 DIAGNOSIS — I5032 Chronic diastolic (congestive) heart failure: Secondary | ICD-10-CM | POA: Diagnosis not present

## 2022-10-20 DIAGNOSIS — G4700 Insomnia, unspecified: Secondary | ICD-10-CM | POA: Diagnosis not present

## 2022-10-22 ENCOUNTER — Telehealth: Payer: Self-pay | Admitting: Nurse Practitioner

## 2022-10-22 DIAGNOSIS — I350 Nonrheumatic aortic (valve) stenosis: Secondary | ICD-10-CM

## 2022-10-22 DIAGNOSIS — D689 Coagulation defect, unspecified: Secondary | ICD-10-CM

## 2022-10-22 DIAGNOSIS — Z952 Presence of prosthetic heart valve: Secondary | ICD-10-CM

## 2022-10-22 NOTE — Telephone Encounter (Signed)
Please notify patient that Dr. Burt Knack reviewed his echo and would like to review it with the structural heart team on Tuesday. He suspects this is something to watch over time, however he would like to get some lab work to rule out blood clotting problems.  I have placed the order for the labs that are needed. Please schedule patient for a lab appointment and let me know if he has any questions.

## 2022-10-22 NOTE — Telephone Encounter (Signed)
Called pt reviewed Providers recommendations:   Please notify patient that Dr. Burt Knack reviewed his echo and would like to review it with the structural heart team on Tuesday. He suspects this is something to watch over time, however he would like to get some lab work to rule out blood clotting problems.   I have placed the order for the labs that are needed. Please schedule patient for a lab appointment and let me know if he has any questions.      Lab appointment scheduled for 10/25/22.

## 2022-10-25 ENCOUNTER — Ambulatory Visit: Payer: Medicare PPO | Attending: Nurse Practitioner

## 2022-10-25 DIAGNOSIS — Z952 Presence of prosthetic heart valve: Secondary | ICD-10-CM

## 2022-10-25 DIAGNOSIS — D689 Coagulation defect, unspecified: Secondary | ICD-10-CM | POA: Diagnosis not present

## 2022-10-25 DIAGNOSIS — I350 Nonrheumatic aortic (valve) stenosis: Secondary | ICD-10-CM | POA: Diagnosis not present

## 2022-10-26 ENCOUNTER — Other Ambulatory Visit: Payer: Self-pay

## 2022-10-26 DIAGNOSIS — T8203XD Leakage of heart valve prosthesis, subsequent encounter: Secondary | ICD-10-CM

## 2022-10-26 DIAGNOSIS — Z952 Presence of prosthetic heart valve: Secondary | ICD-10-CM

## 2022-10-26 LAB — HAPTOGLOBIN: Haptoglobin: 145 mg/dL (ref 34–355)

## 2022-10-26 LAB — CBC
Hematocrit: 46.5 % (ref 37.5–51.0)
Hemoglobin: 15.5 g/dL (ref 13.0–17.7)
MCH: 29 pg (ref 26.6–33.0)
MCHC: 33.3 g/dL (ref 31.5–35.7)
MCV: 87 fL (ref 79–97)
Platelets: 227 10*3/uL (ref 150–450)
RBC: 5.34 x10E6/uL (ref 4.14–5.80)
RDW: 13.4 % (ref 11.6–15.4)
WBC: 5.3 10*3/uL (ref 3.4–10.8)

## 2022-10-26 LAB — LACTATE DEHYDROGENASE: LDH: 223 IU/L (ref 121–224)

## 2022-11-10 ENCOUNTER — Encounter (INDEPENDENT_AMBULATORY_CARE_PROVIDER_SITE_OTHER): Payer: Self-pay

## 2022-11-10 ENCOUNTER — Encounter (INDEPENDENT_AMBULATORY_CARE_PROVIDER_SITE_OTHER): Payer: Medicare PPO | Admitting: Ophthalmology

## 2022-11-10 DIAGNOSIS — D3132 Benign neoplasm of left choroid: Secondary | ICD-10-CM | POA: Diagnosis not present

## 2022-11-10 DIAGNOSIS — H26493 Other secondary cataract, bilateral: Secondary | ICD-10-CM | POA: Diagnosis not present

## 2022-11-16 DIAGNOSIS — R195 Other fecal abnormalities: Secondary | ICD-10-CM | POA: Diagnosis not present

## 2022-11-16 DIAGNOSIS — K625 Hemorrhage of anus and rectum: Secondary | ICD-10-CM | POA: Diagnosis not present

## 2022-11-16 DIAGNOSIS — Z86010 Personal history of colonic polyps: Secondary | ICD-10-CM | POA: Diagnosis not present

## 2023-01-11 DIAGNOSIS — D3132 Benign neoplasm of left choroid: Secondary | ICD-10-CM | POA: Diagnosis not present

## 2023-01-11 DIAGNOSIS — Z961 Presence of intraocular lens: Secondary | ICD-10-CM | POA: Diagnosis not present

## 2023-01-22 DIAGNOSIS — L500 Allergic urticaria: Secondary | ICD-10-CM | POA: Diagnosis not present

## 2023-03-22 ENCOUNTER — Ambulatory Visit: Payer: Medicare PPO | Admitting: Neurology

## 2023-03-22 ENCOUNTER — Encounter: Payer: Self-pay | Admitting: Neurology

## 2023-03-22 VITALS — BP 127/75 | HR 80 | Ht 66.0 in | Wt 193.0 lb

## 2023-03-22 DIAGNOSIS — G40009 Localization-related (focal) (partial) idiopathic epilepsy and epileptic syndromes with seizures of localized onset, not intractable, without status epilepticus: Secondary | ICD-10-CM | POA: Diagnosis not present

## 2023-03-22 MED ORDER — LEVETIRACETAM 1000 MG PO TABS: ORAL_TABLET | ORAL | 3 refills | Status: DC

## 2023-03-22 MED ORDER — LEVETIRACETAM 750 MG PO TABS: ORAL_TABLET | ORAL | Status: DC

## 2023-03-22 NOTE — Patient Instructions (Signed)
Good to see you.   Increase Levetiracetam (Keppra) dose: Take 750mg  tablet in AM, 1000mg  tablet in PM  2. Continue to keep a calendar of symptoms  3. Follow-up in 6 months, call for any changes   Seizure Precautions: 1. If medication has been prescribed for you to prevent seizures, take it exactly as directed.  Do not stop taking the medicine without talking to your doctor first, even if you have not had a seizure in a long time.   2. Avoid activities in which a seizure would cause danger to yourself or to others.  Don't operate dangerous machinery, swim alone, or climb in high or dangerous places, such as on ladders, roofs, or girders.  Do not drive unless your doctor says you may.  3. If you have any warning that you may have a seizure, lay down in a safe place where you can't hurt yourself.    4.  No driving for 6 months from last seizure, as per [facility].   Please refer to the following link on the [employer]ile[employer] of [employer]'s website for more information: http://www.epilepsyfoundation.org/answerplace/Social/driving/drivingu.cfm   5.  Maintain good sleep hygiene.   6.  Contact your doctor if you have any problems that may be related to the medicine you are taking.  7.  Call 911 and bring the patient back to the ED if:        A.  The seizure lasts longer than 5 minutes.       B.  The patient doesn't awaken shortly after the seizure  C.  The patient has new problems such as difficulty seeing, speaking or moving  D.  The patient was injured during the seizure  E.  The patient has a temperature over 102 F (39C)  F.  The patient vomited and now is having trouble breathing

## 2023-03-22 NOTE — Progress Notes (Signed)
NEUROLOGY FOLLOW UP OFFICE NOTE  Douglas Lane [id number] 1946-05-24  HISTORY OF PRESENT ILLNESS: I had the pleasure of seeing Douglas Lane in follow-up in the neurology clinic on 03/22/2023.  The patient was last seen on a year ago for recurrent seizures. He is alone in the office today. Records and images were personally reviewed where available.  On his last visit, his wife reported rare episodes of loss of time. He was hesitant to increase Levetiracetam, he continues on 750mg  BID. Wake and sleep EEG in 05/2022 was normal. In the past year, he reports 2 episodes of loss of time. One was in the Spring. The most recent was a couple of weeks ago, they were visiting his brother in [place] and having lunch. His wife said he ate and was acting strange and sluggish, she asked him to pass the salt and he didn't, he was not engaged in the conversation. She had him lay down, he napped, and when he woke up, he had no recollection of the 30-45 minutes or what he had for lunch. He denies any staring/unresponsive episodes, olfactory/gustatory hallucinations, focal numbness/tingling/weakness, myoclonic jerks. No change in sleep or alcohol. He usually drinks 1 beer and 1-2 bourbon every night. He is meticulous with taking medications regularly. He gets 10 hours of sleep. His wife is asking about the blood test for Alzheimer's disease. He does not notice any memory changes, he denies missing bill payments or getting lost driving. He denies any headaches, dizziness, vision changes. He lost his balance a couple of times, he tripped twice in the last month while his arms were full.    History on Initial Assessment 08/20/2014: This is a 77 yo RH man with a history of 2 seizures in 2012 that occurred 6 months apart, presenting after ER visit for a seizure last 08/03/14. The first seizure occurred in June 2012. He recalls it was a stressful period just soon after he retired. He had a nocturnal convulsion and was brought to  [facility]. He was not started on medication initially as it was his first seizure. On December 2012, he had another nocturnal convulsion and was started on Keppra 500mg  BID with no further seizures for 3 years.  He and his neurologist in [facility] agreed to start weaning off Keppra, and this was completely discontinued the spring/summer of 2015. Around 2-1/2 years ago, he had also been reporting anxiety to his neurologist, and was started on clorazepate 7.5mg , weaned to 1/2 tab for 6 months, then discontinued 3 days prior to the most recent nocturnal seizure on 12/16 off AEDs. He was brought to [facility] ER, there is no bloodwork or drug screen for review, CT head unremarkable.He was found to have posterior dislocation and comminuted fracture of the right humeral head. He had soft tissue injury extending along the right anterior proximal arm. This was reduced in the ER and he continues to wear a sling. He was restarted on Keppra 500mg  BID with no side effects except for mild drowsiness.   His wife reports that since coming off the Keppra last year, he has had 3 incidences of memory lapses, where he could not recall the last few hours. His wife reports that he would look confused but he can function and drive. The last episode occurred 6 weeks ago, he did not recall being in church. His wife reports he was talking fine but had a different look about him. This lasted about 1-2 hours. He had neuropsychological testing for  memory loss and was told he has "short-term memory problems."  In hindsight, when asked about an MRI done in January 2012, he had an episode where he had transient [place] I personally reviewed MRI brain without contrast done which showed mild chronic microvascular changes in the bilateral subcortical regions and right central pons.  He denies any episodes of staring/unresponsiveness, no olfactory/gustatory hallucinations, deja vu, rising epigastric  sensation, focal numbness/tingling/weakness, myoclonic jerks. He has 1-4 drinks daily (beer or whisky), with no change in pattern for many years. He denies any sleep deprivation prior to the seizures, but reports these being stressful periods. 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  There is no history of febrile convulsions, CNS infections such as meningitis/encephalitis, significant traumatic brain injury, neurosurgical procedures, or family history of seizures.  PAST MEDICAL HISTORY: Past Medical History:  Diagnosis Date   Aortic atherosclerosis (HCC) 03/08/2017   CT   Aortic stenosis    severe AS s/p TAVR (03/22/17) with a 26mm Edwards Sapien 3 THV via the TF approach   Arthritis    "pain in my knees" (03/30/2016)   Bilateral cataracts    CAD S/P percutaneous coronary angioplasty 03/31/2016   a. CP/USA -> LHC 03/31/16: 90% prox-mid RCA s/p DES, 40% prox-mid LAD, 25% D2, 25% prox-mid Cx, elevated LVEDP 33.   Chronic diastolic CHF (congestive heart failure) (HCC) 03/31/2016   pt unaware, has family history   Chronic lower back pain    DDD (degenerative disc disease), lumbar    Dyslipidemia    Grade II diastolic dysfunction    Hearing loss    Heart murmur    Heartburn    occ. takes TUMS   Humeral fracture 07/2014   Right   Kidney cysts    1.6 cm simple cyst right   LVH (left ventricular hypertrophy)    a. severe basal septal hypertrophy by echo 03/2016.   Mitral regurgitation    a. mild by echo 03/2016.   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 07/2015, absent seizure   Sinus bradycardia - baseline HR 50s at times.    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     amLODipine (NORVASC) 5 MG tablet TAKE 1 TABLET(5 MG) BY MOUTH DAILY 90 tablet 3   aspirin EC 81 MG tablet Take 81 mg by mouth daily. Swallow whole.     calcium carbonate (TUMS - DOSED IN MG ELEMENTAL CALCIUM) 500 MG chewable tablet Chew 1 tablet by mouth daily as needed for heartburn.      chlorthalidone (HYGROTON) 25 MG tablet Take 1 tablet (25 mg total) by mouth daily. 90 tablet 3   clobetasol ointment (TEMOVATE) 0.05 % Apply 1 application topically 2 (two) times daily as needed (for psoriasis).     clonazePAM (KLONOPIN) 0.5 MG tablet Take 0.5 mg by mouth 2 (two) times daily as needed for anxiety.     colestipol (COLESTID) 1 g tablet Take 2  g by mouth at bedtime.     desonide (DESOWEN) 0.05 % ointment Apply 1 application topically daily as needed (psoriasis).      diphenhydrAMINE (BENADRYL) 25 mg capsule Take 25 mg by mouth daily as needed for allergies.     DULoxetine (CYMBALTA) 30 MG capsule Take 30 mg by mouth daily.     ezetimibe (ZETIA) 10 MG tablet Take 1 tablet (10 mg total) by mouth daily. 90 tablet 3   ibuprofen (ADVIL) 200 MG tablet Take 200 mg by mouth every 6 (six) hours as needed.     levETIRAcetam (KEPPRA) 750 MG tablet Take 1 tablet (750 mg total) by mouth 2 (two) times daily. 60 tablet 0   potassium chloride (KLOR-CON) 10 MEQ tablet TAKE 1 TABLET(10 MEQ) BY MOUTH TWICE DAILY 180 tablet 3   Probiotic Product (PROBIOTIC DAILY PO) Take by mouth.     PROCTOZONE-HC 2.5 % rectal cream Apply 1 application topically daily as needed for hemorrhoids or itching.      rosuvastatin (CRESTOR) 40 MG tablet Take 1 tablet (40 mg total) by mouth daily. 90 tablet 3   shark liver oil-cocoa butter (PREPARATION H) 0.25-3-85.5 % suppository Place 1 suppository rectally as needed for hemorrhoids.     No current facility-administered medications on file  prior to visit.    ALLERGIES: No Known Allergies  FAMILY HISTORY: Family History  Problem Relation Age of Onset   Renal Disease Mother    Congestive Heart Failure Father    Depression Sister     SOCIAL HISTORY: Social History   Socioeconomic History   Marital status: Married    Spouse name: Not on file   Number of children: 1   Years of education: Not on file   Highest education level: Not on file  Occupational History   Not on file  Tobacco Use   Smoking status: Never   Smokeless tobacco: Never  Vaping Use   Vaping status: Never Used  Substance and Sexual Activity   Alcohol use: Yes    Alcohol/week: 2.0 standard drinks of alcohol    Types: 1 Cans of beer, 1 Shots of liquor per week    Comment: daily   Drug use: No   Sexual activity: Not Currently  Other Topics Concern   Not on file  Social History Narrative   Lives with wife and son in two story home      Right handed      Master's degree      Drinks caffeine (Coffee) daily   Social Determinants of Health   Financial Resource Strain: Not on file  Food Insecurity: Not on file  Transportation Needs: Not on file  Physical Activity: Not on file  Stress: Not on file  Social Connections: Not on file  Intimate Partner Violence: Not on file     PHYSICAL EXAM: Vitals:   03/22/23 0954  BP: 127/75  Pulse: 80  SpO2: 94%   General: No acute distress Head:  Normocephalic/atraumatic Skin/Extremities: No rash, no edema Neurological Exam: alert and awake. No aphasia or dysarthria. Fund of knowledge is appropriate. Attention and concentration are normal.   Cranial nerves: Pupils equal, round. Extraocular movements intact with no nystagmus. Visual fields full.  No facial asymmetry.  Motor: Bulk and tone normal, muscle strength 5/5 throughout with no pronator drift.   Finger to nose testing intact.  Gait narrow-based and steady, no ataxia.   IMPRESSION: This is a 77 yo RH man with a history of 2 convulsive  seizures in 2012. After being seizure-free for 3 years, Keppra was tapered off in 2015. He was also taking clorazepate for anxiety and this was tapered off 3 days prior to a nocturnal seizure in 07/2014. NO further convulsions since 2015 however he has rare episodes of transient amnesia, most recently a couple of months ago. EEG in 05/2022 normal. We again discussed either increasing Levetiracetam or switching to Briviact. He is agreeable to increasing Levetiracetam to 750mg  in AM, 1000mg  in PM, continue to keep a calendar of his symptoms. He is aware of [place] driving laws to stop driving after a seizure until 6 months seizure-free. Follow-up in 6 months, call for any changes.     Thank you for allowing me to participate in his care.  Please do not hesitate to call for any questions or concerns.    Patrcia Dolly, M.D.   CC: Dr. Tenny Craw

## 2023-03-30 DIAGNOSIS — L821 Other seborrheic keratosis: Secondary | ICD-10-CM | POA: Diagnosis not present

## 2023-03-30 DIAGNOSIS — Z85828 Personal history of other malignant neoplasm of skin: Secondary | ICD-10-CM | POA: Diagnosis not present

## 2023-03-30 DIAGNOSIS — L400 Psoriasis vulgaris: Secondary | ICD-10-CM | POA: Diagnosis not present

## 2023-03-30 DIAGNOSIS — L570 Actinic keratosis: Secondary | ICD-10-CM | POA: Diagnosis not present

## 2023-03-30 DIAGNOSIS — D1801 Hemangioma of skin and subcutaneous tissue: Secondary | ICD-10-CM | POA: Diagnosis not present

## 2023-04-20 ENCOUNTER — Encounter: Payer: Self-pay | Admitting: Cardiovascular Disease

## 2023-04-20 ENCOUNTER — Ambulatory Visit: Payer: Medicare PPO | Attending: Internal Medicine

## 2023-04-20 ENCOUNTER — Ambulatory Visit: Payer: Medicare PPO | Admitting: Cardiovascular Disease

## 2023-04-20 VITALS — BP 118/64 | HR 66 | Ht 66.0 in | Wt 192.8 lb

## 2023-04-20 DIAGNOSIS — T8203XD Leakage of heart valve prosthesis, subsequent encounter: Secondary | ICD-10-CM | POA: Diagnosis not present

## 2023-04-20 DIAGNOSIS — Z952 Presence of prosthetic heart valve: Secondary | ICD-10-CM

## 2023-04-20 DIAGNOSIS — I5030 Unspecified diastolic (congestive) heart failure: Secondary | ICD-10-CM | POA: Diagnosis not present

## 2023-04-20 LAB — ECHOCARDIOGRAM COMPLETE
AR max vel: 2.84 cm2
AV Area VTI: 2.86 cm2
AV Area mean vel: 2.73 cm2
AV Mean grad: 14 mmHg
AV Peak grad: 24.4 mmHg
Ao pk vel: 2.47 m/s
Area-P 1/2: 1.83 cm2
P 1/2 time: 853 ms
S' Lateral: 2.7 cm

## 2023-04-20 NOTE — Progress Notes (Signed)
Cardiology Office Note:    Date:  04/20/2023   ID:  [name], DOB May 17, 1946, MRN [id number]  PCP:  Douglas Floro, MD   [facility] Providers Cardiologist:  Armanda Magic, MD     Referring MD: Douglas Floro, MD   Chief Complaint  Patient presents with   Follow-up    Aortic insufficiency    History of Present Illness:    Douglas Lane is a 77 y.o. male with a hx of severe rheumatic aortic stenosis, presenting for follow-up evaluation.  The patient is followed regularly by Dr. Mayford Knife.  He underwent TAVR in 2018 with a 26 mm Edwards SAPIEN 3 valve via transfemoral approach without complication.  The patient has done well without significant functional limitation ever since he underwent TAVR.  However, between 2020 and spring 2024 when he had surveillance echo studies, he developed progressive perivalvular regurgitation and presents today for follow-up evaluation with an echocardiogram this morning just before his visit.  The patient denies chest pain, chest pressure, orthopnea, PND, or leg edema.  He does have mild exertional dyspnea.  Past Medical History:  Diagnosis Date   Aortic atherosclerosis (HCC) 03/08/2017   CT   Aortic stenosis    severe AS s/p TAVR (03/22/17) with a 26mm Edwards Sapien 3 THV via the TF approach   Arthritis    "pain in my knees" (03/30/2016)   Bilateral cataracts    CAD S/P percutaneous coronary angioplasty 03/31/2016   a. CP/USA -> LHC 03/31/16: 90% prox-mid RCA s/p DES, 40% prox-mid LAD, 25% D2, 25% prox-mid Cx, elevated LVEDP 33.   Chronic diastolic CHF (congestive heart failure) (HCC) 03/31/2016   pt unaware, has family history   Chronic lower back pain    DDD (degenerative disc disease), lumbar    Dyslipidemia    Grade II diastolic dysfunction    Hearing loss    Heart murmur    Heartburn    occ. takes TUMS   Humeral fracture 07/2014   Right   Kidney cysts    1.6 cm simple cyst right   LVH (left ventricular hypertrophy)     a. severe basal septal hypertrophy by echo 03/2016.   Mitral regurgitation    a. mild by echo 03/2016.   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 07/2015, absent seizure   Sinus bradycardia - baseline HR 50s at times.    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    Thrombosed hemorrhoids     Past Surgical History:  Procedure Laterality Date   APPENDECTOMY     BACK SURGERY     CARDIAC CATHETERIZATION N/A 03/30/2016   Procedure: Right/Left Heart Cath and Coronary Angiography;  Surgeon: Corky Crafts, MD;  Location: [facility] INVASIVE CV LAB;  Service: Cardiovascular;  Laterality: N/A;   CARDIAC CATHETERIZATION N/A 03/30/2016   Procedure: Coronary Stent Intervention;  Surgeon: Corky Crafts, MD;  Location: [facility] INVASIVE CV LAB;  Service: Cardiovascular;  Laterality: N/A;   CATARACT EXTRACTION, BILATERAL     COLONOSCOPY     CORONARY ANGIOPLASTY     FOREARM FRACTURE SURGERY Right ~ 2003-2004   "got steel plate in there"   HERNIA REPAIR  2009   I&D hemorrhoirds     KNEE ARTHROSCOPY Bilateral    MOHS  SURGERY Left    temple   ORIF SCAPULAR FRACTURE Right    right   POSTERIOR LUMBAR FUSION  2004   TEE WITHOUT CARDIOVERSION N/A 11/01/2016   Procedure: TRANSESOPHAGEAL ECHOCARDIOGRAM (TEE);  Surgeon: Thurmon Fair, MD;  Location: [facility] ENDOSCOPY;  Service: Cardiovascular;  Laterality: N/A;   TEE WITHOUT CARDIOVERSION N/A 03/22/2017   Procedure: TRANSESOPHAGEAL ECHOCARDIOGRAM (TEE);  Surgeon: Tonny Bollman, MD;  Location: [facility];  Service: Open Heart Surgery;  Laterality: N/A;   TONSILLECTOMY     TOTAL KNEE ARTHROPLASTY Right 07/24/2018   Procedure: RIGHT TOTAL KNEE ARTHROPLASTY;  Surgeon: Ollen Gross, MD;  Location: [facility] ORS;  Service: Orthopedics;  Laterality: Right;    TOTAL KNEE ARTHROPLASTY Left 06/11/2019    Procedure: TOTAL KNEE ARTHROPLASTY;  Surgeon: Ollen Gross, MD;  Location: [facility] ORS;  Service: Orthopedics;  Laterality: Left;    TRANSCATHETER AORTIC VALVE REPLACEMENT, TRANSFEMORAL N/A 03/22/2017   Procedure: TRANSCATHETER AORTIC VALVE REPLACEMENT, TRANSFEMORAL;  Surgeon: Tonny Bollman, MD;  Location: [facility] OR;  Service: Open Heart Surgery;  Laterality: N/A;    Current Medications: Current Meds  Medication Sig   acetaminophen (TYLENOL) 500 MG tablet as needed for headache.   amLODipine (NORVASC) 5 MG tablet TAKE 1 TABLET(5 MG) BY MOUTH DAILY   aspirin EC 81 MG tablet Take 81 mg by mouth daily. Swallow whole.   calcium carbonate (TUMS - DOSED IN MG ELEMENTAL CALCIUM) 500 MG chewable tablet Chew 1 tablet by mouth daily as needed for heartburn.    chlorthalidone (HYGROTON) 25 MG tablet Take 1 tablet (25 mg total) by mouth daily.   clobetasol ointment (TEMOVATE) 0.05 % Apply 1 application topically 2 (two) times daily as needed (for psoriasis).   clonazePAM (KLONOPIN) 0.5 MG tablet Take 0.5 mg by mouth 2 (two) times daily as needed for anxiety.   colestipol (COLESTID) 1 g tablet Take 2 g by mouth at bedtime.   desonide (DESOWEN) 0.05 % ointment Apply 1 application topically daily as needed (psoriasis).    diphenhydrAMINE (BENADRYL) 25 mg capsule Take 25 mg by mouth daily as needed for allergies.   DULoxetine (CYMBALTA) 30 MG capsule Take 30 mg by mouth daily.   ezetimibe (ZETIA) 10 MG tablet Take 1 tablet (10 mg total) by mouth daily.   ibuprofen (ADVIL) 200 MG tablet Take 200 mg by mouth every 6 (six) hours as needed.   levETIRAcetam (KEPPRA) 1000 MG tablet Take 1 tablet every night   levETIRAcetam (KEPPRA) 750 MG tablet Take 1 tablet every morning   potassium chloride (KLOR-CON) 10 MEQ tablet TAKE 1 TABLET(10 MEQ) BY MOUTH TWICE DAILY   Probiotic Product (PROBIOTIC DAILY PO) Take by mouth.   PROCTOZONE-HC 2.5 % rectal cream Apply 1 application topically daily as needed for hemorrhoids or  itching.    rosuvastatin (CRESTOR) 40 MG tablet Take 1 tablet (40 mg total) by mouth daily.   shark liver oil-cocoa butter (PREPARATION H) 0.25-3-85.5 % suppository Place 1 suppository rectally as needed for hemorrhoids.     Allergies:   Patient has no known allergies.   Social History   Socioeconomic History   Marital status: Married    Spouse name: Not on file   Number of children: 1   Years of education: Not on file   Highest education level: Not on file  Occupational History   Not on file  Tobacco Use   Smoking status: Never   Smokeless tobacco: Never  Vaping Use   Vaping status: Never Used  Substance and  Sexual Activity   Alcohol use: Yes    Alcohol/week: 2.0 standard drinks of alcohol    Types: 1 Cans of beer, 1 Shots of liquor per week    Comment: daily   Drug use: No   Sexual activity: Not Currently  Other Topics Concern   Not on file  Social History Narrative   Lives with wife and son in two story home      Right handed      Master's degree      Drinks caffeine (Coffee) daily   Social Determinants of Health   Financial Resource Strain: Not on file  Food Insecurity: Not on file  Transportation Needs: Not on file  Physical Activity: Not on file  Stress: Not on file  Social Connections: Not on file     Family History: The patient's family history includes Congestive Heart Failure in his father; Depression in his sister; Renal Disease in his mother.  ROS:   Please see the history of present illness.    All other systems reviewed and are negative.  EKGs/Labs/Other Studies Reviewed:    The following studies were reviewed today: EKG Interpretation Date/Time:  Wednesday April 20 2023 09:21:42 EDT Ventricular Rate:  66 PR Interval:  202 QRS Duration:  158 QT Interval:  468 QTC Calculation: 490 R Axis:   -30  Text Interpretation: Normal sinus rhythm Left axis deviation Left bundle branch block When compared with ECG of 06-Jun-2019 11:13, Left  bundle branch block is now Present Criteria for Anterior infarct are no longer Present Confirmed by Tonny Bollman [phone]) on 04/20/2023 9:34:56 AM    Recent Labs: 10/25/2022: Hemoglobin 15.5; Platelets 227  Recent Lipid Panel    Component Value Date/Time   CHOL 125 03/21/2019 0746   TRIG 67 03/21/2019 0746   HDL 52 03/21/2019 0746   CHOLHDL 2.4 03/21/2019 0746   CHOLHDL 2.6 08/06/2016 0831   VLDL 16 08/06/2016 0831   LDLCALC 60 03/21/2019 0746     Risk Assessment/Calculations:                Physical Exam:    VS:  BP 118/64   Pulse 66   Ht 5\' 6"  (1.676 m)   Wt 192 lb 12.8 oz (87.5 kg)   SpO2 94%   BMI 31.12 kg/m     Wt Readings from Last 3 Encounters:  04/20/23 192 lb 12.8 oz (87.5 kg)  03/22/23 193 lb (87.5 kg)  06/29/22 195 lb (88.5 kg)     GEN:  Well nourished, well developed in no acute distress HEENT: Normal NECK: No JVD; No carotid bruits LYMPHATICS: No lymphadenopathy CARDIAC: RRR, 2/6 systolic murmur at the right upper sternal border, no diastolic murmur RESPIRATORY:  Clear to auscultation without rales, wheezing or rhonchi  ABDOMEN: Soft, non-tender, non-distended MUSCULOSKELETAL:  No edema; No deformity  SKIN: Warm and dry NEUROLOGIC:  Alert and oriented x 3 PSYCHIATRIC:  Normal affect   ASSESSMENT:    1. S/P TAVR (transcatheter aortic valve replacement)    PLAN:    In order of problems listed above:  The patient is 6 years out from TAVR with moderate perivalvular regurgitation.  I personally reviewed his echo study this morning with formal interpretation currently pending.  The patient has NYHA functional class II symptoms of exertional dyspnea.  His LV function appears grossly normal with no regional wall motion abnormalities.  Left ventricle is not dilated.  The TAVR valve prosthesis has normal flow characteristics with a mean gradient of  14 mmHg and there is an area of moderate paravalvular regurgitation that appears unchanged from his previous  echo study.  I compared this study side-by-side and I do not appreciate any significant difference.  The patient has undergone lab assessment with no evidence of hemolysis and his hemoglobin is normal.  I think this can be followed over time with no current indication for any intervention.  Recommend that he follow-up in 1 year with a repeat echo at that time.  Otherwise he will continue his current medical program.  He will continue to follow with Dr. Mayford Knife for his other cardiovascular problems.     Medication Adjustments/Labs and Tests Ordered: Current medicines are reviewed at length with the patient today.  Concerns regarding medicines are outlined above.  Orders Placed This Encounter  Procedures   EKG 12-Lead   ECHOCARDIOGRAM COMPLETE   No orders of the defined types were placed in this encounter.   Patient Instructions  Medication Instructions:  Your physician recommends that you continue on your current medications as directed. Please refer to the Current Medication list given to you today.  *If you need a refill on your cardiac medications before your next appointment, please call your pharmacy*  Lab Work: None ordered today.  Testing/Procedures: Your physician has requested that you have an echocardiogram in August 2025 (same day as office visit with Dr. Excell Seltzer). Echocardiography is a painless test that uses sound waves to create images of your heart. It provides your doctor with information about the size and shape of your heart and how well your heart's chambers and valves are working. This procedure takes approximately one hour. There are no restrictions for this procedure. Please do NOT wear cologne, perfume, aftershave, or lotions (deodorant is allowed). Please arrive 15 minutes prior to your appointment time.  Follow-Up: At [facility], you and your health needs are our priority.  As part of our continuing mission to provide you with exceptional heart care, we have  created designated Provider Care Teams.  These Care Teams include your primary Cardiologist (physician) and Advanced Practice Providers (APPs -  Physician Assistants and Nurse Practitioners) who all work together to provide you with the care you need, when you need it.  Your next appointment:   12 month(s) (echocardiogram same day)  The format for your next appointment:   In Person  Provider:   Tonny Bollman, MD{   Signed, Tonny Bollman, MD  04/20/2023 9:53 AM    [facility]

## 2023-04-20 NOTE — Patient Instructions (Addendum)
Medication Instructions:  Your physician recommends that you continue on your current medications as directed. Please refer to the Current Medication list given to you today.  *If you need a refill on your cardiac medications before your next appointment, please call your pharmacy*  Lab Work: None ordered today.  Testing/Procedures: Your physician has requested that you have an echocardiogram in August 2025 (same day as office visit with Dr. Excell Seltzer). Echocardiography is a painless test that uses sound waves to create images of your heart. It provides your doctor with information about the size and shape of your heart and how well your heart's chambers and valves are working. This procedure takes approximately one hour. There are no restrictions for this procedure. Please do NOT wear cologne, perfume, aftershave, or lotions (deodorant is allowed). Please arrive 15 minutes prior to your appointment time.  Follow-Up: At [facility], you and your health needs are our priority.  As part of our continuing mission to provide you with exceptional heart care, we have created designated Provider Care Teams.  These Care Teams include your primary Cardiologist (physician) and Advanced Practice Providers (APPs -  Physician Assistants and Nurse Practitioners) who all work together to provide you with the care you need, when you need it.  Your next appointment:   12 month(s) (echocardiogram same day)  The format for your next appointment:   In Person  Provider:   Tonny Bollman, MD{

## 2023-05-03 ENCOUNTER — Encounter: Payer: Self-pay | Admitting: Cardiovascular Disease

## 2023-05-09 DIAGNOSIS — D3132 Benign neoplasm of left choroid: Secondary | ICD-10-CM | POA: Diagnosis not present

## 2023-05-09 DIAGNOSIS — H26493 Other secondary cataract, bilateral: Secondary | ICD-10-CM | POA: Diagnosis not present

## 2023-06-12 ENCOUNTER — Other Ambulatory Visit: Payer: Self-pay | Admitting: Neurology

## 2023-06-12 DIAGNOSIS — G40009 Localization-related (focal) (partial) idiopathic epilepsy and epileptic syndromes with seizures of localized onset, not intractable, without status epilepticus: Secondary | ICD-10-CM

## 2023-06-15 ENCOUNTER — Other Ambulatory Visit: Payer: Self-pay | Admitting: Nurse Practitioner

## 2023-06-15 DIAGNOSIS — Z23 Encounter for immunization: Secondary | ICD-10-CM | POA: Diagnosis not present

## 2023-06-15 DIAGNOSIS — Z6831 Body mass index (BMI) 31.0-31.9, adult: Secondary | ICD-10-CM | POA: Diagnosis not present

## 2023-06-15 DIAGNOSIS — I77810 Thoracic aortic ectasia: Secondary | ICD-10-CM | POA: Diagnosis not present

## 2023-06-15 DIAGNOSIS — E7800 Pure hypercholesterolemia, unspecified: Secondary | ICD-10-CM | POA: Diagnosis not present

## 2023-06-15 DIAGNOSIS — F324 Major depressive disorder, single episode, in partial remission: Secondary | ICD-10-CM | POA: Diagnosis not present

## 2023-06-15 DIAGNOSIS — I5032 Chronic diastolic (congestive) heart failure: Secondary | ICD-10-CM | POA: Diagnosis not present

## 2023-06-15 DIAGNOSIS — Z0000 Encounter for general adult medical examination without abnormal findings: Secondary | ICD-10-CM | POA: Diagnosis not present

## 2023-06-15 DIAGNOSIS — F439 Reaction to severe stress, unspecified: Secondary | ICD-10-CM | POA: Diagnosis not present

## 2023-06-15 DIAGNOSIS — Z79899 Other long term (current) drug therapy: Secondary | ICD-10-CM | POA: Diagnosis not present

## 2023-06-15 LAB — LAB REPORT - SCANNED: EGFR: 88

## 2023-07-17 ENCOUNTER — Other Ambulatory Visit: Payer: Self-pay | Admitting: Nurse Practitioner

## 2023-09-08 ENCOUNTER — Other Ambulatory Visit: Payer: Self-pay | Admitting: Neurology

## 2023-09-08 DIAGNOSIS — G40009 Localization-related (focal) (partial) idiopathic epilepsy and epileptic syndromes with seizures of localized onset, not intractable, without status epilepticus: Secondary | ICD-10-CM

## 2023-09-21 ENCOUNTER — Encounter (HOSPITAL_BASED_OUTPATIENT_CLINIC_OR_DEPARTMENT_OTHER): Payer: Self-pay | Admitting: Cardiology

## 2023-09-21 ENCOUNTER — Telehealth (HOSPITAL_BASED_OUTPATIENT_CLINIC_OR_DEPARTMENT_OTHER): Payer: Self-pay | Admitting: *Deleted

## 2023-09-21 NOTE — Telephone Encounter (Signed)
 Called pt in responds to pt's mychart message. S/w Pharmacist @ [facility] # [phone], pharmacist did not see a problem filling pt's zeita. Zetia  is ready for pick up. According to the last ov note from Dr. Wonda there was not mention of going off zeita.

## 2023-10-06 ENCOUNTER — Encounter (HOSPITAL_BASED_OUTPATIENT_CLINIC_OR_DEPARTMENT_OTHER): Payer: Self-pay | Admitting: Cardiology

## 2023-10-06 MED ORDER — ROSUVASTATIN CALCIUM 40 MG PO TABS: 40.0000 mg | ORAL_TABLET | Freq: Every day | ORAL | 3 refills | Status: AC

## 2023-10-07 ENCOUNTER — Ambulatory Visit: Payer: Medicare PPO | Admitting: Neurology

## 2023-10-16 ENCOUNTER — Other Ambulatory Visit: Payer: Self-pay | Admitting: Nurse Practitioner

## 2023-10-16 DIAGNOSIS — I10 Essential (primary) hypertension: Secondary | ICD-10-CM

## 2023-10-20 ENCOUNTER — Ambulatory Visit: Payer: Medicare PPO | Admitting: Neurology

## 2023-10-25 DIAGNOSIS — Z961 Presence of intraocular lens: Secondary | ICD-10-CM | POA: Diagnosis not present

## 2023-10-25 DIAGNOSIS — D3132 Benign neoplasm of left choroid: Secondary | ICD-10-CM | POA: Diagnosis not present

## 2023-11-08 DIAGNOSIS — R69 Illness, unspecified: Secondary | ICD-10-CM | POA: Diagnosis not present

## 2023-11-09 DIAGNOSIS — R69 Illness, unspecified: Secondary | ICD-10-CM | POA: Diagnosis not present

## 2023-11-16 DIAGNOSIS — R3912 Poor urinary stream: Secondary | ICD-10-CM | POA: Diagnosis not present

## 2023-11-16 DIAGNOSIS — N401 Enlarged prostate with lower urinary tract symptoms: Secondary | ICD-10-CM | POA: Diagnosis not present

## 2023-11-22 DIAGNOSIS — Z09 Encounter for follow-up examination after completed treatment for conditions other than malignant neoplasm: Secondary | ICD-10-CM | POA: Diagnosis not present

## 2023-11-22 DIAGNOSIS — Z6831 Body mass index (BMI) 31.0-31.9, adult: Secondary | ICD-10-CM | POA: Diagnosis not present

## 2023-11-22 DIAGNOSIS — R899 Unspecified abnormal finding in specimens from other organs, systems and tissues: Secondary | ICD-10-CM | POA: Diagnosis not present

## 2023-11-22 DIAGNOSIS — E876 Hypokalemia: Secondary | ICD-10-CM | POA: Diagnosis not present

## 2023-11-22 DIAGNOSIS — J189 Pneumonia, unspecified organism: Secondary | ICD-10-CM | POA: Diagnosis not present

## 2023-12-11 ENCOUNTER — Other Ambulatory Visit: Payer: Self-pay | Admitting: Neurology

## 2023-12-11 DIAGNOSIS — G40009 Localization-related (focal) (partial) idiopathic epilepsy and epileptic syndromes with seizures of localized onset, not intractable, without status epilepticus: Secondary | ICD-10-CM

## 2023-12-16 DIAGNOSIS — N401 Enlarged prostate with lower urinary tract symptoms: Secondary | ICD-10-CM | POA: Diagnosis not present

## 2023-12-20 DIAGNOSIS — J189 Pneumonia, unspecified organism: Secondary | ICD-10-CM | POA: Diagnosis not present

## 2024-01-13 DIAGNOSIS — Z6831 Body mass index (BMI) 31.0-31.9, adult: Secondary | ICD-10-CM | POA: Diagnosis not present

## 2024-01-13 DIAGNOSIS — F439 Reaction to severe stress, unspecified: Secondary | ICD-10-CM | POA: Diagnosis not present

## 2024-01-30 ENCOUNTER — Encounter: Payer: Self-pay | Admitting: Neurology

## 2024-01-30 ENCOUNTER — Ambulatory Visit: Admitting: Neurology

## 2024-01-30 VITALS — BP 113/68 | HR 78 | Ht 66.0 in | Wt 190.8 lb

## 2024-01-30 DIAGNOSIS — G40009 Localization-related (focal) (partial) idiopathic epilepsy and epileptic syndromes with seizures of localized onset, not intractable, without status epilepticus: Secondary | ICD-10-CM

## 2024-01-30 MED ORDER — OXCARBAZEPINE 300 MG PO TABS: ORAL_TABLET | ORAL | 6 refills | Status: DC

## 2024-01-30 NOTE — Patient Instructions (Signed)
 I hope you start feeling better as we get off the Keppra .  Start Oxcarbazepine (Trileptal) 300mg : take 1/2 tablet twice a day for 1 week, then increase to 1 tablet twice a day for 1 week,        then increase to 2 tablets twice a day and continue  2. Continue taking Keppra  750mg  twice a day for another 3 weeks as you are increasing the new medication. After a week of taking the 2 tablets twice a day of Trileptal, reduce the Keppra  to 1 tablet at night for 1 week,  then reduce to 1 tablet every other night for 1 week,  then stop Keppra .  3. Follow-up in 6 weeks, call for any changes   Seizure Precautions: 1. If medication has been prescribed for you to prevent seizures, take it exactly as directed.  Do not stop taking the medicine without talking to your doctor first, even if you have not had a seizure in a long time.   2. Avoid activities in which a seizure would cause danger to yourself or to others.  Don't operate dangerous machinery, swim alone, or climb in high or dangerous places, such as on ladders, roofs, or girders.  Do not drive unless your doctor says you may.  3. If you have any warning that you may have a seizure, lay down in a safe place where you can't hurt yourself.    4.  No driving for 6 months from last seizure, as per [place]  state law.   Please refer to the following link on the Epilepsy Foundation of America's website for more information: http://www.epilepsyfoundation.org/answerplace/Social/driving/drivingu.cfm   5.  Maintain good sleep hygiene. Avoid alcohol  6.  Contact your doctor if you have any problems that may be related to the medicine you are taking.  7.  Call 911 and bring the patient back to the ED if:        A.  The seizure lasts longer than 5 minutes.       B.  The patient doesn't awaken shortly after the seizure  C.  The patient has new problems such as difficulty seeing, speaking or moving  D.  The patient was injured during the seizure  E.   The patient has a temperature over 102 F (39C)  F.  The patient vomited and now is having trouble breathing

## 2024-01-30 NOTE — Progress Notes (Signed)
 NEUROLOGY FOLLOW UP OFFICE NOTE  Douglas Lane [id number] [birth year]  HISTORY OF PRESENT ILLNESS: I had the pleasure of seeing Douglas Lane in follow-up in the neurology clinic on 01/30/2024.  The patient was last seen 10 months ago for recurrent seizures. He is alone in the office today. Records and images were personally reviewed where available. No convulsions since 2015. On his last visit, he reported an episode that occurred in 02/2023 where his wife noted he was acting strange and sluggish, then later on he had no recollection of 30-45 minutes/what he had for lunch. We discussed increasing Levetiracetam  to 750mg  in AM, 1000mg  in PM but he states pharmacy did not send the 1000mg  tablets so he continued the 750mg  BID. In addition, he would like to get completely off the Keppra . He feels listless, tired and sleep all the time, unmotivated, and states I blame it on the Keppra . He has been feeling this way for years. He reports a couple of brief periods where he has lost time, last was probably 3-4 months ago. His wife always knows when something is occurring, he is staring off into space. He could not recall if he had taken his medication and had to count them, it turns out he had taken them. He denies any headaches, dizziness, double vision, focal numbness/tingling/weakness. He had a fall 2 weeks ago while going down stairs carrying things. He gets 10 hours of sleep sometimes and still feels tired. He had a sleep study years ago (2021) and was found to have OSA, but could not tolerate CPAP masks. He sleeps on his side and has tried the chin strap. He is getting depressed because he cannot do things he could do 10 years ago. Duloxetine was increased last month with no improvement in symptoms. He endorses a lot of stress.   History on Initial Assessment 08/20/2014: This is a 78 yo RH man with a history of 2 seizures in 2012 that occurred 6 months apart, presenting after ER visit for a seizure last  08/03/14. The first seizure occurred in June 2012. He recalls it was a stressful period just soon after he retired. He had a nocturnal convulsion and was brought to [facility]. He was not started on medication initially as it was his first seizure. On December 2012, he had another nocturnal convulsion and was started on Keppra  500mg  BID with no further seizures for 3 years.  He and his neurologist in [facility] agreed to start weaning off Keppra , and this was completely discontinued the spring/summer of 2015. Around 2-1/2 years ago, he had also been reporting anxiety to his neurologist, and was started on clorazepate 7.5mg , weaned to 1/2 tab for 6 months, then discontinued 3 days prior to the most recent nocturnal seizure on 12/16 off AEDs. He was brought to [facility] ER, there is no bloodwork or drug screen for review, CT head unremarkable.He was found to have posterior dislocation and comminuted fracture of the right humeral head. He had soft tissue injury extending along the right anterior proximal arm. This was reduced in the ER and he continues to wear a sling. He was restarted on Keppra  500mg  BID with no side effects except for mild drowsiness.   His wife reports that since coming off the Keppra  last year, he has had 3 incidences of memory lapses, where he could not recall the last few hours. His wife reports that he would look confused but he can function and drive. The last episode occurred  6 weeks ago, he did not recall being in church. His wife reports he was talking fine but had a different look about him. This lasted about 1-2 hours. He had neuropsychological testing for memory loss and was told he has short-term memory problems.  In hindsight, when asked about an MRI done in January 2012, he had an episode where he had transient memory [place] I personally reviewed MRI brain without contrast done which showed mild chronic microvascular changes in the bilateral  subcortical regions and right central pons.  He denies any episodes of staring/unresponsiveness, no olfactory/gustatory hallucinations, deja vu, rising epigastric sensation, focal numbness/tingling/weakness, myoclonic jerks. He has 1-4 drinks daily (beer or whisky), with no change in pattern for many years. He denies any sleep deprivation prior to the seizures, but reports these being stressful periods. 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   Epilepsy Risk Factors:  He had a normal birth and early development.  There is no history of febrile convulsions, CNS infections such as meningitis/encephalitis, significant traumatic brain injury, neurosurgical procedures, or family history of seizures.  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 CP/USA  -> LHC 03/31/16: 90% prox-mid RCA s/p DES, 40% prox-mid LAD, 25% D2, 25% prox-mid Cx, elevated LVEDP 33.   Chronic diastolic CHF (congestive heart failure) (HCC) 03/31/2016   pt unaware, has family history   Chronic lower back pain    DDD (degenerative disc disease), lumbar    Dyslipidemia    Grade II diastolic dysfunction    Hearing loss    Heart murmur    Heartburn    occ. takes TUMS   Humeral fracture 07/2014   Right   Kidney cysts    1.6 cm simple cyst right   LVH (left ventricular hypertrophy)    a. severe basal septal hypertrophy by echo 03/2016.   Mitral regurgitation    a. mild by echo 03/2016.   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 07/2015, absent seizure    Sinus bradycardia - baseline HR 50s at times.    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    Thrombosed hemorrhoids     MEDICATIONS: Current Outpatient Medications on File Prior to Visit  Medication Sig Dispense Refill   acetaminophen  (TYLENOL ) 500 MG tablet as needed for headache.     amLODipine  (NORVASC ) 5 MG tablet TAKE 1 TABLET(5 MG) BY MOUTH DAILY 90 tablet 1   aspirin  EC 81 MG tablet Take 81 mg by mouth daily. Swallow whole.     calcium  carbonate (TUMS - DOSED IN MG ELEMENTAL CALCIUM ) 500 MG chewable tablet Chew 1 tablet by mouth daily as needed for heartburn.      chlorthalidone  (HYGROTON ) 25 MG tablet TAKE 1 TABLET(25 MG) BY MOUTH DAILY 90 tablet 3   clobetasol ointment (TEMOVATE) 0.05 % Apply 1 application topically 2 (two) times daily as needed (for psoriasis).  clonazePAM (KLONOPIN) 0.5 MG tablet Take 0.5 mg by mouth 2 (two) times daily as needed for anxiety.     colestipol (COLESTID) 1 g tablet Take 2 g by mouth at bedtime.     desonide (DESOWEN) 0.05 % ointment Apply 1 application topically daily as needed (psoriasis).      diphenhydrAMINE  (BENADRYL ) 25 mg capsule Take 25 mg by mouth daily as needed for allergies.     DULoxetine (CYMBALTA) 60 MG capsule Take 60 mg by mouth daily.     ezetimibe  (ZETIA ) 10 MG tablet TAKE 1 TABLET(10 MG) BY MOUTH DAILY 90 tablet 3   ibuprofen (ADVIL) 200 MG tablet Take 200 mg by mouth every 6 (six) hours as needed.     levETIRAcetam  (KEPPRA ) 750 MG tablet TAKE 1 TABLET(750 MG) BY MOUTH TWICE DAILY 180 tablet 0   potassium chloride  (KLOR-CON ) 10 MEQ tablet TAKE 1 TABLET(10 MEQ) BY MOUTH TWICE DAILY 180 tablet 2   Probiotic Product (PROBIOTIC DAILY PO) Take by mouth.     PROCTOZONE -HC 2.5 % rectal cream Apply 1 application topically daily as needed for hemorrhoids or itching.      rosuvastatin  (CRESTOR ) 40 MG tablet Take 1 tablet (40 mg total) by mouth daily. 90 tablet 3   shark liver oil-cocoa butter (PREPARATION H)  0.25-3-85.5 % suppository Place 1 suppository rectally as needed for hemorrhoids.     DULoxetine (CYMBALTA) 30 MG capsule Take 30 mg by mouth daily.     No current facility-administered medications on file prior to visit.    ALLERGIES: No Known Allergies  FAMILY HISTORY: Family History  Problem Relation Age of Onset   Renal Disease Mother    Congestive Heart Failure Father    Depression Sister     SOCIAL HISTORY: Social History   Socioeconomic History   Marital status: Married    Spouse name: Not on file   Number of children: 1   Years of education: Not on file   Highest education level: Not on file  Occupational History   Not on file  Tobacco Use   Smoking status: Never   Smokeless tobacco: Never  Vaping Use   Vaping status: Never Used  Substance and Sexual Activity   Alcohol use: Yes    Alcohol/week: 2.0 standard drinks of alcohol    Types: 1 Cans of beer, 1 Shots of liquor per week    Comment: daily   Drug use: No   Sexual activity: Not Currently  Other Topics Concern   Not on file  Social History Narrative   Lives with wife and son in two story home      Right handed      Master's degree      Drinks caffeine (Coffee) daily   Social Drivers of Corporate investment banker Strain: Not on file  Food Insecurity: Not on file  Transportation Needs: Not on file  Physical Activity: Not on file  Stress: Not on file  Social Connections: Not on file  Intimate Partner Violence: Not on file     PHYSICAL EXAM: Vitals:   01/30/24 1345  BP: 113/68  Pulse: 78  SpO2: 94%   General: No acute distress Head:  Normocephalic/atraumatic Skin/Extremities: No rash, no edema Neurological Exam: alert and awake. No aphasia or dysarthria. Fund of knowledge is appropriate.  Attention and concentration are normal.   Cranial nerves: Pupils equal, round. Extraocular movements intact with no nystagmus. Visual fields full.  No facial asymmetry.  Motor: Bulk and tone normal,  muscle strength 5/5 throughout with no pronator drift.   Finger to nose testing intact.  Gait slow and cautious, no ataxia.    IMPRESSION: This is a 78 yo RH man with a history of 2 convulsive seizures in 2012. After being seizure-free for 3 years, Keppra  was tapered off in 2015. He was also taking clorazepate for anxiety and this was tapered off 3 days prior to a nocturnal seizure in 07/2014. No further convulsions since 2015 however he continues to report brief episodes of transient amnesia. EEG in 05/2022 was normal. He attributes sleepiness, fatigue, listlessness/lack of motivation to Keppra  and would like to discontinue medication. We discussed risk for seizure with sudden discontinuation of medication, and agreed to start oxcarbazepine with uptitration schedule. Side effects discussed. Start Oxcarbazepine 300mg  1/2 tab BID for 1 week, then 1 tab BID for 1 week, then 2 tabs BID and continue. He knows to continue Keppra  750mg  BID for another 3 weeks, then he can start weaning off Keppra  as instructed. Risks of breakthrough seizure with any medication adjustment were discussed. We also discussed how untreated OSA could contribute to his symptoms but he is against using a CPAP. He is aware of [place] driving laws to stop driving after a seizure until 6 months seizure-free. Follow-up in 6 weeks, call for any changes.   Thank you for allowing me to participate in his care.  Please do not hesitate to call for any questions or concerns.    Rayfield Cairo, M.D.   CC: Dr. Avanell Bob

## 2024-02-20 DIAGNOSIS — R195 Other fecal abnormalities: Secondary | ICD-10-CM | POA: Diagnosis not present

## 2024-02-20 DIAGNOSIS — K625 Hemorrhage of anus and rectum: Secondary | ICD-10-CM | POA: Diagnosis not present

## 2024-03-09 ENCOUNTER — Ambulatory Visit: Admitting: Neurology

## 2024-03-09 ENCOUNTER — Encounter: Payer: Self-pay | Admitting: Neurology

## 2024-03-09 VITALS — BP 136/74 | HR 72 | Ht 66.0 in | Wt 191.8 lb

## 2024-03-09 DIAGNOSIS — R413 Other amnesia: Secondary | ICD-10-CM

## 2024-03-09 DIAGNOSIS — G40009 Localization-related (focal) (partial) idiopathic epilepsy and epileptic syndromes with seizures of localized onset, not intractable, without status epilepticus: Secondary | ICD-10-CM | POA: Diagnosis not present

## 2024-03-09 LAB — TSH: TSH: 2.6 m[IU]/L (ref 0.40–4.50)

## 2024-03-09 LAB — VITAMIN B12: Vitamin B-12: 245 pg/mL (ref 200–1100)

## 2024-03-09 MED ORDER — OXCARBAZEPINE 600 MG PO TABS: 600.0000 mg | ORAL_TABLET | Freq: Two times a day (BID) | ORAL | 3 refills | Status: DC

## 2024-03-09 NOTE — Patient Instructions (Addendum)
 Good to see you.  Have bloodwork done for TSH, B12  2. A prescription has been sent for Oxcarbazepine  600mg : take 1 tablet twice a day  3. Continue to monitor the twitches/muscle spasms  4. Consider psychotherapy with Dr. David Gutterman, I can send a referral if you wish  5. Follow-up in 3 months, call for any changes   Seizure Precautions: 1. If medication has been prescribed for you to prevent seizures, take it exactly as directed.  Do not stop taking the medicine without talking to your doctor first, even if you have not had a seizure in a long time.   2. Avoid activities in which a seizure would cause danger to yourself or to others.  Don't operate dangerous machinery, swim alone, or climb in high or dangerous places, such as on ladders, roofs, or girders.  Do not drive unless your doctor says you may.  3. If you have any warning that you may have a seizure, lay down in a safe place where you can't hurt yourself.    4.  No driving for 6 months from last seizure, as per [place]  state law.   Please refer to the following link on the Epilepsy Foundation of America's website for more information: http://www.epilepsyfoundation.org/answerplace/Social/driving/drivingu.cfm   5.  Maintain good sleep hygiene. Avoid alcohol.  6.  Contact your doctor if you have any problems that may be related to the medicine you are taking.  7.  Call 911 and bring the patient back to the ED if:        A.  The seizure lasts longer than 5 minutes.       B.  The patient doesn't awaken shortly after the seizure  C.  The patient has new problems such as difficulty seeing, speaking or moving  D.  The patient was injured during the seizure  E.  The patient has a temperature over 102 F (39C)  F.  The patient vomited and now is having trouble breathing

## 2024-03-09 NOTE — Progress Notes (Signed)
 NEUROLOGY FOLLOW UP OFFICE NOTE  Douglas Lane [id number] 1945-11-11  HISTORY OF PRESENT ILLNESS: I had the pleasure of seeing Douglas Lane in follow-up in the neurology clinic on 03/09/2024.  The patient was last seen 6 weeks ago for seizures. He is alone in the office today. On his last visit, he wanted to wean off Keppra  due to feeling tired, sleepy, unmotivated, which he attributed to Keppra . He was started on Oxcarbazepine , currently 600mg  BID. He has weaned off Keppra , last dose was over a week ago. He has not noticed any difference with mood, he feels depressed most of the time, sleeping has not changed much, he still takes 1.5-2 hour naps. He cannot tell any benefit on the Cymbalta 60mg  dose. He has noticed a little shakiness in his hands when holding something heavy, he feels this started since oxcarbazepine  was initiated. He had little twitches/muscle spasms on his abdomen and lower back. When he is working in the yard or mowing, he does not notice the shakiness. He denies any staring/unresponsive episodes, gaps in time, olfactory/gustatory hallucinations, focal numbness/tingling/weakness, myoclonic jerks. No headaches, dizziness, vision changes, no falls. He has noticed short-term memory changes, he puts things down such as his cup of coffee or phone and cannot find where he put them. He denies getting lost driving. He denies missing medications and stays on top of the bills.     History on Initial Assessment 08/20/2014: This is a 78 yo RH man with a history of 2 seizures in 2012 that occurred 6 months apart, presenting after ER visit for a seizure last 08/03/14. The first seizure occurred in June 2012. He recalls it was a stressful period just soon after he retired. He had a nocturnal convulsion and was brought to [facility]. He was not started on medication initially as it was his first seizure. On December 2012, he had another nocturnal convulsion and was started on Keppra  500mg   BID with no further seizures for 3 years.  He and his neurologist in [facility] agreed to start weaning off Keppra , and this was completely discontinued the spring/summer of 2015. Around 2-1/2 years ago, he had also been reporting anxiety to his neurologist, and was started on clorazepate 7.5mg , weaned to 1/2 tab for 6 months, then discontinued 3 days prior to the most recent nocturnal seizure on 12/16 off AEDs. He was brought to [facility] ER, there is no bloodwork or drug screen for review, CT head unremarkable.He was found to have posterior dislocation and comminuted fracture of the right humeral head. He had soft tissue injury extending along the right anterior proximal arm. This was reduced in the ER and he continues to wear a sling. He was restarted on Keppra  500mg  BID with no side effects except for mild drowsiness.   His wife reports that since coming off the Keppra  last year, he has had 3 incidences of memory lapses, where he could not recall the last few hours. His wife reports that he would look confused but he can function and drive. The last episode occurred 6 weeks ago, he did not recall being in church. His wife reports he was talking fine but had a different look about him. This lasted about 1-2 hours. He had neuropsychological testing for memory loss and was told he has short-term memory problems.  In hindsight, when asked about an MRI done in January 2012, he had an episode where he had transient [place] I  personally reviewed MRI brain without contrast done which showed mild chronic microvascular changes in the bilateral subcortical regions and right central pons.  He denies any episodes of staring/unresponsiveness, no olfactory/gustatory hallucinations, deja vu, rising epigastric sensation, focal numbness/tingling/weakness, myoclonic jerks. He has 1-4 drinks daily (beer or whisky), with no change in pattern for many years. He denies any sleep deprivation prior to  the seizures, but reports these being stressful periods. 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   Epilepsy Risk Factors:  He had a normal birth and early development.  There is no history of febrile convulsions, CNS infections such as meningitis/encephalitis, significant traumatic brain injury, neurosurgical procedures, or family history of seizures.  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 CP/USA  -> LHC 03/31/16: 90% prox-mid RCA s/p DES, 40% prox-mid LAD, 25% D2, 25% prox-mid Cx, elevated LVEDP 33.   Chronic diastolic CHF (congestive heart failure) (HCC) 03/31/2016   pt unaware, has family history   Chronic lower back pain    DDD (degenerative disc disease), lumbar    Dyslipidemia    Grade II diastolic dysfunction    Hearing loss    Heart murmur    Heartburn    occ. takes TUMS   Humeral fracture 07/2014   Right   Kidney cysts    1.6 cm simple cyst right   LVH (left ventricular hypertrophy)    a. severe basal septal hypertrophy by echo 03/2016.   Mitral regurgitation    a. mild by echo 03/2016.   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 07/2015, absent seizure   Sinus bradycardia - baseline HR 50s at times.    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    Thrombosed hemorrhoids     MEDICATIONS: Current Outpatient Medications on File Prior to Visit  Medication Sig Dispense Refill   acetaminophen  (TYLENOL ) 500 MG tablet as needed for headache.     amLODipine  (NORVASC ) 5 MG tablet TAKE 1  TABLET(5 MG) BY MOUTH DAILY 90 tablet 1   aspirin  EC 81 MG tablet Take 81 mg by mouth daily. Swallow whole.     calcium  carbonate (TUMS - DOSED IN MG ELEMENTAL CALCIUM ) 500 MG chewable tablet Chew 1 tablet by mouth daily as needed for heartburn.      chlorthalidone  (HYGROTON ) 25 MG tablet TAKE 1 TABLET(25 MG) BY MOUTH DAILY 90 tablet 3   clobetasol ointment (TEMOVATE) 0.05 % Apply 1 application topically 2 (two) times daily as needed (for psoriasis).     clonazePAM (KLONOPIN) 0.5 MG tablet Take 0.5 mg by mouth 2 (two) times daily as needed for anxiety.     colestipol (COLESTID) 1 g tablet Take 2 g by mouth at bedtime.     desonide (DESOWEN) 0.05 % ointment Apply 1 application topically daily as needed (psoriasis).      diphenhydrAMINE  (BENADRYL ) 25 mg capsule Take 25 mg by mouth daily as needed for allergies.  DULoxetine (CYMBALTA) 60 MG capsule Take 60 mg by mouth daily.     ezetimibe  (ZETIA ) 10 MG tablet TAKE 1 TABLET(10 MG) BY MOUTH DAILY 90 tablet 3   ibuprofen (ADVIL) 200 MG tablet Take 200 mg by mouth every 6 (six) hours as needed.     Oxcarbazepine  (TRILEPTAL ) 300 MG tablet Take 1/2 tablet twice a day for 1 week, then increase to 1 tablet twice a day for 1 week, then increase to 2 tablets twice a day and continue 120 tablet 6   potassium chloride  (KLOR-CON ) 10 MEQ tablet TAKE 1 TABLET(10 MEQ) BY MOUTH TWICE DAILY 180 tablet 2   Probiotic Product (PROBIOTIC DAILY PO) Take by mouth.     PROCTOZONE -HC 2.5 % rectal cream Apply 1 application topically daily as needed for hemorrhoids or itching.      rosuvastatin  (CRESTOR ) 40 MG tablet Take 1 tablet (40 mg total) by mouth daily. 90 tablet 3   shark liver oil-cocoa butter (PREPARATION H) 0.25-3-85.5 % suppository Place 1 suppository rectally as needed for hemorrhoids.     No current facility-administered medications on file prior to visit.    ALLERGIES: No Known Allergies  FAMILY HISTORY: Family History  Problem Relation Age of Onset    Renal Disease Mother    Congestive Heart Failure Father    Depression Sister     SOCIAL HISTORY: Social History   Socioeconomic History   Marital status: Married    Spouse name: Not on file   Number of children: 1   Years of education: Not on file   Highest education level: Not on file  Occupational History   Not on file  Tobacco Use   Smoking status: Never   Smokeless tobacco: Never  Vaping Use   Vaping status: Never Used  Substance and Sexual Activity   Alcohol use: Yes    Alcohol/week: 2.0 standard drinks of alcohol    Types: 1 Cans of beer, 1 Shots of liquor per week    Comment: daily   Drug use: No   Sexual activity: Not Currently  Other Topics Concern   Not on file  Social History Narrative   Lives with wife and son in two story home      Right handed      Master's degree      Drinks caffeine (Coffee) daily   Social Drivers of Corporate investment banker Strain: Not on file  Food Insecurity: Not on file  Transportation Needs: Not on file  Physical Activity: Not on file  Stress: Not on file  Social Connections: Not on file  Intimate Partner Violence: Not on file     PHYSICAL EXAM: Vitals:   03/09/24 1253  BP: 136/74  Pulse: 72  SpO2: 98%   General: No acute distress Head:  Normocephalic/atraumatic Skin/Extremities: No rash, no edema Neurological Exam: alert and awake. No aphasia or dysarthria. Fund of knowledge is appropriate.  Attention and concentration are normal.   Cranial nerves: Pupils equal, round. Extraocular movements intact with no nystagmus. Visual fields full.  No facial asymmetry.  Motor: Bulk and tone normal, muscle strength 5/5 throughout with no pronator drift.   Finger to nose testing intact.  Gait narrow-based and steady, able to tandem walk adequately.  Romberg negative. No tremors in office today.   IMPRESSION: This is a 78 yo RH man with a history of 2 convulsive seizures in 2012. After being seizure-free for 3 years, Keppra   was tapered off in 2015. He was also  taking clorazepate for anxiety and this was tapered off 3 days prior to a nocturnal seizure in 07/2014. No convulsions since 2015. He continued to report brief episodes of transient amnesia, EEG in 05/2022 was normal. He attributed sleepiness, fatigue, listlessness/lack of motivation to Keppra , this was weaned off last week, he is now on Oxcarbazepine  600mg  BID without any amnestic episodes. He has not noticed any change in mood, we discussed consideration for psychotherapy, he will think about it. He is noticing slight muscle twitches, more when holding something heavy, none in office today, continue to monitor. He is reporting short-term memory changes, check TSH and B12. Depression may be playing a role as well. He is aware of [place] driving laws to stop driving after a seizure until 6 months seizure-free. Follow-up in 3 months, call for any changes.   Thank you for allowing me to participate in his care.  Please do not hesitate to call for any questions or concerns.    Darice Shivers, M.D.   CC: Dr. Okey

## 2024-03-12 ENCOUNTER — Encounter: Payer: Self-pay | Admitting: Neurology

## 2024-03-12 ENCOUNTER — Ambulatory Visit: Payer: Self-pay | Admitting: Neurology

## 2024-03-12 NOTE — Telephone Encounter (Signed)
MyChart message sent to patient with results and recommendations.

## 2024-03-14 DIAGNOSIS — W57XXXA Bitten or stung by nonvenomous insect and other nonvenomous arthropods, initial encounter: Secondary | ICD-10-CM | POA: Diagnosis not present

## 2024-03-14 DIAGNOSIS — S20461A Insect bite (nonvenomous) of right back wall of thorax, initial encounter: Secondary | ICD-10-CM | POA: Diagnosis not present

## 2024-03-14 DIAGNOSIS — S70361A Insect bite (nonvenomous), right thigh, initial encounter: Secondary | ICD-10-CM | POA: Diagnosis not present

## 2024-03-14 DIAGNOSIS — S70261A Insect bite (nonvenomous), right hip, initial encounter: Secondary | ICD-10-CM | POA: Diagnosis not present

## 2024-03-18 ENCOUNTER — Other Ambulatory Visit: Payer: Self-pay | Admitting: Neurology

## 2024-03-18 DIAGNOSIS — G40009 Localization-related (focal) (partial) idiopathic epilepsy and epileptic syndromes with seizures of localized onset, not intractable, without status epilepticus: Secondary | ICD-10-CM

## 2024-03-26 ENCOUNTER — Encounter: Payer: Self-pay | Admitting: Cardiology

## 2024-03-26 NOTE — Telephone Encounter (Signed)
 Attempted to call patient. Left message to call back on personal voicemail.

## 2024-04-05 DIAGNOSIS — Z03818 Encounter for observation for suspected exposure to other biological agents ruled out: Secondary | ICD-10-CM | POA: Diagnosis not present

## 2024-04-05 DIAGNOSIS — J189 Pneumonia, unspecified organism: Secondary | ICD-10-CM | POA: Diagnosis not present

## 2024-04-05 DIAGNOSIS — R509 Fever, unspecified: Secondary | ICD-10-CM | POA: Diagnosis not present

## 2024-04-05 DIAGNOSIS — R051 Acute cough: Secondary | ICD-10-CM | POA: Diagnosis not present

## 2024-04-05 DIAGNOSIS — J029 Acute pharyngitis, unspecified: Secondary | ICD-10-CM | POA: Diagnosis not present

## 2024-04-05 DIAGNOSIS — R5383 Other fatigue: Secondary | ICD-10-CM | POA: Diagnosis not present

## 2024-04-09 ENCOUNTER — Other Ambulatory Visit: Payer: Self-pay | Admitting: Cardiovascular Disease

## 2024-04-09 ENCOUNTER — Other Ambulatory Visit: Payer: Self-pay | Admitting: Cardiology

## 2024-04-09 DIAGNOSIS — I10 Essential (primary) hypertension: Secondary | ICD-10-CM

## 2024-04-18 DIAGNOSIS — Z09 Encounter for follow-up examination after completed treatment for conditions other than malignant neoplasm: Secondary | ICD-10-CM | POA: Diagnosis not present

## 2024-04-18 DIAGNOSIS — J189 Pneumonia, unspecified organism: Secondary | ICD-10-CM | POA: Diagnosis not present

## 2024-05-01 DIAGNOSIS — Z85828 Personal history of other malignant neoplasm of skin: Secondary | ICD-10-CM | POA: Diagnosis not present

## 2024-05-01 DIAGNOSIS — L570 Actinic keratosis: Secondary | ICD-10-CM | POA: Diagnosis not present

## 2024-05-01 DIAGNOSIS — D225 Melanocytic nevi of trunk: Secondary | ICD-10-CM | POA: Diagnosis not present

## 2024-05-01 DIAGNOSIS — L821 Other seborrheic keratosis: Secondary | ICD-10-CM | POA: Diagnosis not present

## 2024-05-01 DIAGNOSIS — L400 Psoriasis vulgaris: Secondary | ICD-10-CM | POA: Diagnosis not present

## 2024-05-01 DIAGNOSIS — D485 Neoplasm of uncertain behavior of skin: Secondary | ICD-10-CM | POA: Diagnosis not present

## 2024-05-01 DIAGNOSIS — D1801 Hemangioma of skin and subcutaneous tissue: Secondary | ICD-10-CM | POA: Diagnosis not present

## 2024-05-07 ENCOUNTER — Encounter: Payer: Self-pay | Admitting: Neurology

## 2024-05-16 ENCOUNTER — Encounter: Payer: Self-pay | Admitting: Cardiovascular Disease

## 2024-05-16 ENCOUNTER — Other Ambulatory Visit: Payer: Self-pay | Admitting: *Deleted

## 2024-05-16 ENCOUNTER — Ambulatory Visit: Attending: Cardiovascular Disease | Admitting: Cardiovascular Disease

## 2024-05-16 ENCOUNTER — Ambulatory Visit (HOSPITAL_COMMUNITY)
Admission: RE | Admit: 2024-05-16 | Discharge: 2024-05-16 | Disposition: A | Discharge status: Home / Self Care | Source: Ambulatory Visit | Attending: Cardiovascular Disease | Admitting: Cardiovascular Disease

## 2024-05-16 VITALS — BP 128/70 | HR 57 | Ht 67.0 in | Wt 187.6 lb

## 2024-05-16 DIAGNOSIS — Z952 Presence of prosthetic heart valve: Secondary | ICD-10-CM | POA: Insufficient documentation

## 2024-05-16 DIAGNOSIS — I2510 Atherosclerotic heart disease of native coronary artery without angina pectoris: Secondary | ICD-10-CM | POA: Diagnosis not present

## 2024-05-16 DIAGNOSIS — I10 Essential (primary) hypertension: Secondary | ICD-10-CM | POA: Diagnosis not present

## 2024-05-16 DIAGNOSIS — T8203XD Leakage of heart valve prosthesis, subsequent encounter: Secondary | ICD-10-CM | POA: Diagnosis not present

## 2024-05-16 DIAGNOSIS — F32A Depression, unspecified: Secondary | ICD-10-CM

## 2024-05-16 LAB — ECHOCARDIOGRAM COMPLETE
AR max vel: 2.09 cm2
AV Area VTI: 2.01 cm2
AV Area mean vel: 2.03 cm2
AV Mean grad: 13.8 mmHg
AV Peak grad: 25 mmHg
Ao pk vel: 2.5 m/s
Area-P 1/2: 1.91 cm2
P 1/2 time: 752 ms
S' Lateral: 2.1 cm

## 2024-05-16 NOTE — Assessment & Plan Note (Signed)
 Blood pressure controlled on amlodipine  and chlorthalidone .

## 2024-05-16 NOTE — Patient Instructions (Signed)
 Medication Instructions:  No medication changes were made at this visit. Continue current regimen.   *If you need a refill on your cardiac medications before your next appointment, please call your pharmacy*  Lab Work: None ordered today. If you have labs (blood work) drawn today and your tests are completely normal, you will receive your results only by: MyChart Message (if you have MyChart) OR A paper copy in the mail If you have any lab test that is abnormal or we need to change your treatment, we will call you to review the results.  Testing/Procedures: Your physician has requested that you have an echocardiogram same day as 1 year follow-up with Dr. Wonda. Echocardiography is a painless test that uses sound waves to create images of your heart. It provides your doctor with information about the size and shape of your heart and how well your heart's chambers and valves are working. This procedure takes approximately one hour. There are no restrictions for this procedure. Please do NOT wear cologne, perfume, aftershave, or lotions (deodorant is allowed). Please arrive 15 minutes prior to your appointment time.  Please note: We ask at that you not bring children with you during ultrasound (echo/ vascular) testing. Due to room size and safety concerns, children are not allowed in the ultrasound rooms during exams. Our front office staff cannot provide observation of children in our lobby area while testing is being conducted. An adult accompanying a patient to their appointment will only be allowed in the ultrasound room at the discretion of the ultrasound technician under special circumstances. We apologize for any inconvenience.   Follow-Up: At [facility], you and your health needs are our priority.  As part of our continuing mission to provide you with exceptional heart care, our providers are all part of one team.  This team includes your primary Cardiologist (physician) and  Advanced Practice Providers or APPs (Physician Assistants and Nurse Practitioners) who all work together to provide you with the care you need, when you need it.  Your next appointment:   1 year(s)  Provider:   Ozell Wonda, MD

## 2024-05-16 NOTE — Telephone Encounter (Signed)
 Done

## 2024-05-16 NOTE — Progress Notes (Signed)
 Cardiology Office Note:    Date:  05/16/2024   ID:  YONA STANSBURY, DOB Jan 22, 1946, MRN [id number]  PCP:  Okey Carlin Redbird, MD   [facility] Providers Cardiologist:  Wilbert Bihari, MD     Referring MD: Okey Carlin Redbird, MD   Chief Complaint  Patient presents with   Follow-up    S/P TAVR    History of Present Illness:    HAYVEN FATIMA is a 78 y.o. male with a hx of aortic valve disease status post TAVR in 2018.  He was treated with a 26 mm SAPIEN 3 valve via transfemoral approach without complication.  On surveillance echo studies, he has developed progressive perivalvular regurgitation.  He returns today after an echo surveillance study for clinical follow-up.  He has been noted to have moderate perivalvular regurgitation on his echo from last year with grossly normal LV function and a mean transvalvular gradient of 14 mmHg.  He has had no clinical evidence of hemolysis.  The patient had an echocardiogram just prior to his visit today with the current study not interpreted yet.  However, I was able to review the images and he continues to demonstrate findings of normal LV systolic function, no evidence of LV dilatation, and moderate aortic insufficiency that appears unchanged from previous.  The patient is here alone today.  He states that he has been having some problems with depression and anxiety.  He has been talking through things with a counselor.  He denies chest pain, chest pressure, or shortness of breath.  He follows SBE prophylaxis per guideline recommendations.  He is compliant with his medications.   Current Medications: Current Meds  Medication Sig   acetaminophen  (TYLENOL ) 500 MG tablet as needed for headache.   amLODipine  (NORVASC ) 5 MG tablet TAKE 1 TABLET(5 MG) BY MOUTH DAILY   aspirin  EC 81 MG tablet Take 81 mg by mouth daily. Swallow whole.   calcium  carbonate (TUMS - DOSED IN MG ELEMENTAL CALCIUM ) 500 MG chewable tablet Chew 1 tablet by mouth daily  as needed for heartburn.    chlorthalidone  (HYGROTON ) 25 MG tablet TAKE 1 TABLET(25 MG) BY MOUTH DAILY   clobetasol ointment (TEMOVATE) 0.05 % Apply 1 application topically 2 (two) times daily as needed (for psoriasis).   clonazePAM (KLONOPIN) 0.5 MG tablet Take 0.5 mg by mouth 2 (two) times daily as needed for anxiety.   desonide (DESOWEN) 0.05 % ointment Apply 1 application topically daily as needed (psoriasis).    diphenhydrAMINE  (BENADRYL ) 25 mg capsule Take 25 mg by mouth daily as needed for allergies.   DULoxetine (CYMBALTA) 60 MG capsule Take 60 mg by mouth daily.   ezetimibe  (ZETIA ) 10 MG tablet TAKE 1 TABLET(10 MG) BY MOUTH DAILY   ibuprofen (ADVIL) 200 MG tablet Take 200 mg by mouth every 6 (six) hours as needed.   oxcarbazepine  (TRILEPTAL ) 600 MG tablet Take 1 tablet (600 mg total) by mouth 2 (two) times daily.   potassium chloride  (KLOR-CON ) 10 MEQ tablet TAKE 1 TABLET(10 MEQ) BY MOUTH TWICE DAILY   Probiotic Product (PROBIOTIC DAILY PO) Take by mouth.   PROCTOZONE -HC 2.5 % rectal cream Apply 1 application topically daily as needed for hemorrhoids or itching.    rosuvastatin  (CRESTOR ) 40 MG tablet Take 1 tablet (40 mg total) by mouth daily.   shark liver oil-cocoa butter (PREPARATION H) 0.25-3-85.5 % suppository Place 1 suppository rectally as needed for hemorrhoids.   tamsulosin (FLOMAX) 0.4 MG CAPS capsule Take 0.4 mg by mouth  daily.     Allergies:   Patient has no known allergies.   ROS:   Please see the history of present illness.    All other systems reviewed and are negative.  EKGs/Labs/Other Studies Reviewed:    The following studies were reviewed today: Cardiac Studies & Procedures   ______________________________________________________________________________________________ CARDIAC CATHETERIZATION  CARDIAC CATHETERIZATION 03/30/2016  Conclusion  Prox RCA to Mid RCA lesion, 90 %stenosed. A STENT SYNERGY DES [id number] drug eluting stent was successfully placed.   Post intervention, there is a 0% residual stenosis.  LV end diastolic pressure is moderately elevated.  There is moderate aortic valve stenosis. There is a large gradient across the aortic valve, but based on the echo and how easily the catheter crossed the valve, I suspect much of the gradient is subvalvular.  There is no pulmonic valve stenosis.  Cardiac index 2.6. Normal pulmonary artery pressures.  Continue dual antiplatelet therapy. He is a candidate for the TWILIGHT study.  I spoke with Dr. Shlomo during the case regarding the aortic stenosis. There is a large gradient present but is mostly subvalvular most likely. The pigtail catheter in JR catheter across the valve without even a wire.  He'll need diuresis as well to help with his elevated LVEDP.  Findings Coronary Findings Diagnostic  Dominance: Right  Left Anterior Descending  Second Diagonal Branch  Left Circumflex  Right Coronary Artery  Right Posterior Descending Artery  Intervention  Prox RCA to Mid RCA lesion Angioplasty Pre-stent angioplasty was performed using a BALLOON EMERGE MR 2.25X20. Maximum pressure: 14 atm. A STENT SYNERGY DES [id number] drug eluting stent was successfully placed. Stent strut is well apposed. Post-stent angioplasty was performed using a BALLOON Beecher EMERGE MR 3.5X20. Maximum pressure: 18 atm. The pre-interventional distal flow is normal (TIMI 3).  The post-interventional distal flow is normal (TIMI 3). The intervention was successful . No complications occurred at this lesion. There is a 0% residual stenosis post intervention.   STRESS TESTS  MYOCARDIAL PERFUSION IMAGING 03/19/2016  Interpretation Summary  Nuclear stress EF: 46%.  Blood pressure demonstrated a normal response to exercise.  Downsloping ST segment depression ST segment depression of 2 mm was noted during stress in the II, III, aVF, V6, V5 and V4 leads.  Defect 1: There is a medium defect of moderate severity present in  the mid inferior, mid inferolateral and apical inferior location.  Findings consistent with prior myocardial infarction.  This is an intermediate risk study.  Moderate size and intensity fixed inferior defect suggestive of scar. No reversible ischemia. LVEF 46% with inferior hypokinesis. This is an intermediate risk study.   ECHOCARDIOGRAM  ECHOCARDIOGRAM COMPLETE 04/20/2023  Narrative ECHOCARDIOGRAM REPORT    Patient Name:   HABEEB PUERTAS Date of Exam: 04/20/2023 Medical Rec #:  [id number]      Height:       66.0 in Accession #:    [id number]     Weight:       193.0 lb Date of Birth:  05/05/1946     BSA:          1.970 m Patient Age:    76 years       BP:           127/75 mmHg Patient Gender: M              HR:           68 bpm. Exam Location:  [address]  Procedure: 2D Echo, Cardiac Doppler, Color Doppler,  3D Echo and Strain Analysis  Indications:    [id number] S/P TAVR evaluation (26mm Edwards Sapien 3 THV)  History:        Patient has prior history of Echocardiogram examinations, most recent 10/18/2022. CHF, CAD, Abnormal ECG, Signs/Symptoms:Murmur; Risk Factors:Hypertension and Dyslipidemia. Aortic stenosis. Paravalvular leak subsequent encounter. LVH. Bradycardia. Rheumatic fever. Abnormal nuclear stress test. Aortic Valve: 26 mm Sapien prosthetic, stented (TAVR) valve is present in the aortic position. Procedure Date: 03/22/2017.  Sonographer:    Jon Hacker RCS Referring Phys: [phone] Ricki Vanhandel  IMPRESSIONS   1. Left ventricular ejection fraction, by estimation, is 55 to 60%. Left ventricular ejection fraction by 3D volume is 56 %. The left ventricle has normal function. The left ventricle has no regional wall motion abnormalities. There is severe asymmetric left ventricular hypertrophy of the basal-septal segment. Left ventricular diastolic parameters are consistent with Grade I diastolic dysfunction (impaired relaxation). 2. Right ventricular systolic function is  mildly reduced. The right ventricular size is normal. 3. The aortic valve has been repaired/replaced. Perivalvular regurgitation is moderate and eccentric with a jet that is almost perpendicular to the mitral annulus. There is a 26 mm Sapien prosthetic (TAVR) valve present in the aortic position. Procedure Date: 03/22/2017. Echo findings are consistent with perivalvular leak of the aortic prosthesis. Aortic valve area, by VTI measures 2.86 cm. Aortic valve mean gradient measures 14.0 mmHg. Aortic valve Vmax measures 2.47 m/s. Peak gradient 24.4 mmHg, DI is 0.54. 4. Aortic dilatation noted. There is borderline dilatation of the ascending aorta, measuring 38 mm. 5. The mitral valve is abnormal. Trivial mitral valve regurgitation.  Comparison(s): Changes from prior study are noted. 10/18/2022: LVEF 60-65%, grade 1 DD, 26 mm TAVR - mean gradient 11 mmHg, moderate paravalvular leak.  FINDINGS Left Ventricle: Left ventricular ejection fraction, by estimation, is 55 to 60%. Left ventricular ejection fraction by 3D volume is 56 %. The left ventricle has normal function. The left ventricle has no regional wall motion abnormalities. The left ventricular internal cavity size was normal in size. There is severe asymmetric left ventricular hypertrophy of the basal-septal segment. Left ventricular diastolic parameters are consistent with Grade I diastolic dysfunction (impaired relaxation). Indeterminate filling pressures.  Right Ventricle: The right ventricular size is normal. No increase in right ventricular wall thickness. Right ventricular systolic function is mildly reduced.  Left Atrium: Left atrial size was normal in size.  Right Atrium: Right atrial size was normal in size.  Pericardium: There is no evidence of pericardial effusion.  Mitral Valve: The mitral valve is abnormal. There is mild calcification of the anterior mitral valve leaflet(s). Mild mitral annular calcification. Trivial mitral valve  regurgitation.  Tricuspid Valve: The tricuspid valve is grossly normal. Tricuspid valve regurgitation is trivial.  Aortic Valve: The aortic valve has been repaired/replaced. Aortic valve regurgitation is moderate. Aortic regurgitation PHT measures 853 msec. Aortic valve mean gradient measures 14.0 mmHg. Aortic valve peak gradient measures 24.4 mmHg. Aortic valve area, by VTI measures 2.86 cm. There is a 26 mm Sapien prosthetic, stented (TAVR) valve present in the aortic position. Procedure Date: 03/22/2017.  Pulmonic Valve: The pulmonic valve was grossly normal. Pulmonic valve regurgitation is trivial.  Aorta: Aortic dilatation noted. There is borderline dilatation of the ascending aorta, measuring 38 mm.  IAS/Shunts: No atrial level shunt detected by color flow Doppler.   LEFT VENTRICLE PLAX 2D LVIDd:         3.80 cm         Diastology LVIDs:  2.70 cm         LV e' medial:    3.15 cm/s LV PW:         1.10 cm         LV E/e' medial:  24.5 LV IVS:        1.60 cm         LV e' lateral:   9.30 cm/s LVOT diam:     2.60 cm         LV E/e' lateral: 8.3 LV SV:         170 LV SV Index:   86 LVOT Area:     5.31 cm        3D Volume EF LV 3D EF:    Left ventricul ar ejection fraction by 3D volume is 56 %.  3D Volume EF: 3D EF:        56 % LV EDV:       131 ml LV ESV:       58 ml LV SV:        73 ml  RIGHT VENTRICLE RV Basal diam:  3.40 cm RV S prime:     9.30 cm/s TAPSE (M-mode): 2.5 cm  LEFT ATRIUM             Index        RIGHT ATRIUM           Index LA diam:        4.00 cm 2.03 cm/m   RA Area:     14.90 cm LA Vol (A2C):   48.1 ml 24.42 ml/m  RA Volume:   33.30 ml  16.91 ml/m LA Vol (A4C):   42.9 ml 21.78 ml/m LA Biplane Vol: 49.9 ml 25.33 ml/m AORTIC VALVE AV Area (Vmax):    2.84 cm AV Area (Vmean):   2.73 cm AV Area (VTI):     2.86 cm AV Vmax:           247.00 cm/s AV Vmean:          174.000 cm/s AV VTI:            0.595 m AV Peak Grad:      24.4  mmHg AV Mean Grad:      14.0 mmHg LVOT Vmax:         132.00 cm/s LVOT Vmean:        89.400 cm/s LVOT VTI:          0.320 m LVOT/AV VTI ratio: 0.54 AI PHT:            853 msec  AORTA Ao Asc diam: 3.80 cm  MITRAL VALVE MV Area (PHT): 1.83 cm    SHUNTS MV Decel Time: 415 msec    Systemic VTI:  0.32 m MV E velocity: 77.10 cm/s  Systemic Diam: 2.60 cm MV A velocity: 99.00 cm/s MV E/A ratio:  0.78  Vinie Maxcy MD Electronically signed by Vinie Maxcy MD Signature Date/Time: 04/20/2023/11:22:04 AM    Final   TEE  ECHO TEE 11/01/2016  Narrative *[place]* *[facility]* [address] [place], [place] [address] [phone]  ------------------------------------------------------------------- Transesophageal Echocardiography  Patient:    Kodi, Steil MR #:       [id number] Study Date: 11/01/2016 Gender:     M Age:        70 Height:     166.4 cm Weight:     76.7 kg BSA:  1.9 m^2 Pt. Status: Room:  SONOGRAPHER  Kurt Jointer, RDCS ORDERING     Jerel Balding, MD PERFORMING   Jerel Balding, MD  cc:  ------------------------------------------------------------------- LV EF: 55% -   60%  ------------------------------------------------------------------- Indications:      Aortic stenosis 424.1.  ------------------------------------------------------------------- Study Conclusions  - Left ventricle: The cavity size was normal. There was mild concentric hypertrophy. Systolic function was normal. The estimated ejection fraction was in the range of 55% to 60%. Wall motion was normal; there were no regional wall motion abnormalities. Doppler parameters are consistent with abnormal left ventricular relaxation (grade 1 diastolic dysfunction). - Aortic valve: Valve mobility was severely restricted. There was severe stenosis. There was mild regurgitation. Valve area (VTI): 0.67 cm^2. Valve area (Vmax): 0.67 cm^2. Valve area (Vmean):  0.69 cm^2. - Mitral valve: No evidence of vegetation. - Left atrium: No evidence of thrombus in the atrial cavity or appendage. No spontaneous echo contrast was observed. - Right atrium: No evidence of thrombus in the atrial cavity or appendage. - Tricuspid valve: No evidence of vegetation. - Pulmonic valve: No evidence of vegetation.  ------------------------------------------------------------------- Study data:   Study status:  Routine.  Consent:  The risks, benefits, and alternatives to the procedure were explained to the patient and informed consent was obtained.  Procedure:  The patient reported no pain pre or post test. Initial setup. The patient was brought to the laboratory. Surface ECG leads were monitored. Sedation. Conscious sedation was administered by cardiology staff. Transesophageal echocardiography. An adult multiplane transesophageal probe was inserted by the attending cardiologistwithout difficulty. Image quality was adequate.  Study completion:  The patient tolerated the procedure well. There were no complications.  Administered medications:   Fentanyl , 100mcg, IV.  Midazolam , 8mg , IV.          Diagnostic transesophageal echocardiography.  2D and color Doppler.  Birthdate:  Patient birthdate: 29-Aug-1945.  Age:  Patient is 78 yr old.  Sex:  Gender: male.    BMI: 27.7 kg/m^2.  Blood pressure:     154/76  Patient status:  Outpatient.  Study date:  Study date: 11/01/2016. Study time: 11:42 AM.  Location:  Endoscopy.  -------------------------------------------------------------------  ------------------------------------------------------------------- Left ventricle:  The cavity size was normal. There was mild concentric hypertrophy. Systolic function was normal. The estimated ejection fraction was in the range of 55% to 60%. Wall motion was normal; there were no regional wall motion abnormalities. Doppler parameters are consistent with abnormal left ventricular  relaxation (grade 1 diastolic dysfunction).  ------------------------------------------------------------------- Aortic valve:   Probably trileaflet; severely thickened, severely calcified leaflets. Valve mobility was severely restricted. Doppler:   There was severe stenosis.   There was mild regurgitation.    VTI ratio of LVOT to aortic valve: 0.2. Valve area (VTI): 0.67 cm^2. Indexed valve area (VTI): 0.35 cm^2/m^2. Peak velocity ratio of LVOT to aortic valve: 0.2. Valve area (Vmax): 0.67 cm^2. Indexed valve area (Vmax): 0.35 cm^2/m^2. Mean velocity ratio of LVOT to aortic valve: 0.21. Valve area (Vmean): 0.69 cm^2. Indexed valve area (Vmean): 0.36 cm^2/m^2.    Mean gradient (S): 53 mm Hg. Peak gradient (S): 92 mm Hg.  ------------------------------------------------------------------- Aorta:  The aorta was not dilated and trivially diseased.  ------------------------------------------------------------------- Mitral valve:   Structurally normal valve.   Leaflet separation was normal.  No evidence of vegetation.  Doppler:  There was no regurgitation.  ------------------------------------------------------------------- Left atrium:  The atrium was normal in size.  No evidence of thrombus in the atrial cavity or appendage. No spontaneous echo contrast  was observed.  Emptying velocity was normal.  ------------------------------------------------------------------- Right ventricle:  The cavity size was normal. Systolic function was normal.  ------------------------------------------------------------------- Pulmonic valve:    Structurally normal valve.   Cusp separation was normal.  No evidence of vegetation.  ------------------------------------------------------------------- Tricuspid valve:   Structurally normal valve.   Leaflet separation was normal.  No evidence of vegetation.  Doppler:  There was  no regurgitation.  ------------------------------------------------------------------- Right atrium:  The atrium was normal in size.  No evidence of thrombus in the atrial cavity or appendage.  ------------------------------------------------------------------- Pericardium:  There was no pericardial effusion.  ------------------------------------------------------------------- Post procedure conclusions Ascending Aorta:  - The aorta was not dilated and trivially diseased.  ------------------------------------------------------------------- Measurements  Left ventricle                           Value Stroke volume, 2D                        71    ml Stroke volume/bsa, 2D                    37    ml/m^2  LVOT                                     Value LVOT ID, S                               20.6  mm LVOT area                                3.33  cm^2 LVOT peak velocity, S                    96.8  cm/s LVOT mean velocity, S                    69.3  cm/s LVOT VTI, S                              24.9  cm LVOT peak gradient, S                    4     mm Hg Stroke volume (SV), LVOT DP              83    ml Stroke index (SV/bsa), LVOT DP           43.7  ml/m^2  Aortic valve                             Value Aortic valve peak velocity, S            480   cm/s Aortic valve mean velocity, S            337   cm/s Aortic valve VTI, S                      123   cm Aortic mean gradient, S  53    mm Hg Aortic peak gradient, S                  92    mm Hg VTI ratio, LVOT/AV                       0.2 Aortic valve area, VTI                   0.67  cm^2 Aortic valve area/bsa, VTI               0.35  cm^2/m^2 Velocity ratio, peak, LVOT/AV            0.2 Aortic valve area, peak velocity         0.67  cm^2 Aortic valve area/bsa, peak velocity     0.35  cm^2/m^2 Velocity ratio, mean, LVOT/AV            0.21 Aortic valve area, mean velocity         0.69  cm^2 Aortic valve  area/bsa, mean velocity     0.36  cm^2/m^2  Legend: (L)  and  (H)  mark values outside specified reference range.  ------------------------------------------------------------------- Prepared and Electronically Authenticated by  Jerel Balding, MD 2018-03-19T17:26:24    CT SCANS  CT CORONARY MORPH W/CTA COR W/SCORE 02/02/2017  Addendum 02/02/2017  2:51 PM ADDENDUM REPORT: 02/02/2017 14:48  CLINICAL DATA:  78 year old male with severe aortic stenosis being evaluated for TAVR procedure.  EXAM: Cardiac TAVR CT  TECHNIQUE: The patient was scanned on a Philips 256 scanner. A 120 kV retrospective scan was triggered in the descending thoracic aorta at 111 HU's. Gantry rotation speed was 270 msecs and collimation was .9 mm. No beta blockade or nitro were given. The 3D data set was reconstructed in 5% intervals of the R-R cycle. Systolic and diastolic phases were analyzed on a dedicated work station using MPR, MIP and VRT modes. The patient received 80 cc of contrast.  FINDINGS: Aortic Valve: Trileaflet with partially co-joined left and right leaflets, severely calcified and thickened aortic valve with severely restricted leaflet opening. Only minimal calcifications are extending into LVOT.  Aorta: Normal size with no dissection and trivial calcifications in the aortic arch.  Sinotubular Junction:  30 x 29 mm  Ascending Thoracic Aorta:  34 x 33 mm  Aortic Arch:  28 x 27 mm  Descending Thoracic Aorta:  27 x 27 mm  Sinus of Valsalva Measurements:  Non-coronary:  38 mm  Right -coronary:  34 mm  Left -coronary:  36 mm  Coronary Artery Height above Annulus:  Left Main:  15 mm  Right Coronary:  18 mm  Virtual Basal Annulus Measurements:  Maximum/Minimum Diameter:  30 x 23 mm  Perimeter:  86 mm  Area:  555 mm2  Optimum Fluoroscopic Angle for Delivery:  LAO 5 CAU 4  No thrombus in the left atrial appendage.  IMPRESSION: 1. Trileaflet aortic valve partially  co-joined left and right leaflets. Leaflets are severely calcified and thickened with severely restricted leaflet opening. Only minimal calcifications are extending into LVOT. Annular measurements suitable for delivery of a 29 mm Edwards-SAPIEN 3 valve.  2.  Sufficient coronary to annulus distance.  3. Optimum Fluoroscopic Angle for Delivery:  LAO 5 CAU 4  4.  No thrombus in the left atrial appendage.  Leim Moose   Electronically Signed By: Leim Moose On: 02/02/2017 14:48  Narrative EXAM: OVER-READ INTERPRETATION  CT  CHEST  The following report is an over-read performed by radiologist Dr. Rockey Jacquet [facility] on 02/02/2017. This over-read does not include interpretation of cardiac or coronary anatomy or pathology. The coronary CTA interpretation by the cardiologist is attached.  COMPARISON:  None.  FINDINGS: Vascular: Bovine arch. Tortuous descending thoracic aorta. Aortic valve calcifications. No central pulmonary embolism, on this non-dedicated study.  Mediastinum/Nodes: No mediastinal or hilar adenopathy.  Lungs/Pleura: No pleural fluid.  Lung apices excluded.  Clear lungs.  Upper Abdomen: Probable mild hepatic steatosis. Normal imaged portions of the spleen, stomach, pancreas, adrenal glands.  Musculoskeletal: Remote right scapular and rib fractures. Accentuation of expected thoracic kyphosis.  IMPRESSION: 1. No acute process in the extracardiac imaged chest. 2. Aortic valve calcifications, consistent with the clinical history of aortic valve stenosis.  Electronically Signed: By: Rockey Kilts M.D. On: 02/02/2017 11:55     ______________________________________________________________________________________________      EKG:   EKG Interpretation Date/Time:  Wednesday May 16 2024 13:32:05 EDT Ventricular Rate:  59 PR Interval:  234 QRS Duration:  166 QT Interval:  490 QTC Calculation: 485 R Axis:   3  Text  Interpretation: Sinus bradycardia with 1st degree A-V block Left bundle branch block When compared with ECG of 20-Apr-2023 09:21, PR interval has increased QRS axis Shifted right Left bundle branch block previously noted Confirmed by Wonda Sharper [phone]) on 05/16/2024 1:45:30 PM    Recent Labs: 03/09/2024: TSH 2.60  Recent Lipid Panel    Component Value Date/Time   CHOL 125 03/21/2019 0746   TRIG 67 03/21/2019 0746   HDL 52 03/21/2019 0746   CHOLHDL 2.4 03/21/2019 0746   CHOLHDL 2.6 08/06/2016 0831   VLDL 16 08/06/2016 0831   LDLCALC 60 03/21/2019 0746     Risk Assessment/Calculations:                Physical Exam:    VS:  BP 128/70   Pulse (!) 57   Ht 5' 7 (1.702 m)   Wt 187 lb 9.6 oz (85.1 kg)   SpO2 95%   BMI 29.38 kg/m     Wt Readings from Last 3 Encounters:  05/16/24 187 lb 9.6 oz (85.1 kg)  03/09/24 191 lb 12.8 oz (87 kg)  01/30/24 190 lb 12.8 oz (86.5 kg)     GEN:  Well nourished, well developed in no acute distress HEENT: Normal NECK: No JVD; No carotid bruits LYMPHATICS: No lymphadenopathy CARDIAC: RRR, distant heart sounds with 1/6 systolic and 1/6 diastolic decrescendo murmur at the left lower sternal border RESPIRATORY:  Clear to auscultation without rales, wheezing or rhonchi  ABDOMEN: Soft, non-tender, non-distended MUSCULOSKELETAL:  No edema; No deformity  SKIN: Warm and dry NEUROLOGIC:  Alert and oriented x 3 PSYCHIATRIC:  Normal affect   Assessment & Plan Benign essential HTN Blood pressure controlled on amlodipine  and chlorthalidone . Coronary artery disease involving native coronary artery of native heart without angina pectoris Doing well with no anginal symptoms.  Continue aspirin . Paravalvular leak (prosthetic valve), subsequent encounter The patient has stable, moderate paravalvular aortic regurgitation.  I personally reviewed his echo images today and they appear unchanged with no evidence of LV dysfunction or dilatation.  He has had  no evidence of hemolysis and his paravalvular regurgitation is unchanged compared to last year's study.  I will await the formal echo interpretation and I told the patient I would get in touch with him if there were any differences in the formal interpretation from what I have  seen today.  Return in 1 year for follow-up with a repeat echocardiogram.  No indication for intervention at this point.     Medication Adjustments/Labs and Tests Ordered: Current medicines are reviewed at length with the patient today.  Concerns regarding medicines are outlined above.  Orders Placed This Encounter  Procedures   EKG 12-Lead   ECHOCARDIOGRAM COMPLETE   No orders of the defined types were placed in this encounter.   Patient Instructions  Medication Instructions:  No medication changes were made at this visit. Continue current regimen.   *If you need a refill on your cardiac medications before your next appointment, please call your pharmacy*  Lab Work: None ordered today. If you have labs (blood work) drawn today and your tests are completely normal, you will receive your results only by: MyChart Message (if you have MyChart) OR A paper copy in the mail If you have any lab test that is abnormal or we need to change your treatment, we will call you to review the results.  Testing/Procedures: Your physician has requested that you have an echocardiogram same day as 1 year follow-up with Dr. Wonda. Echocardiography is a painless test that uses sound waves to create images of your heart. It provides your doctor with information about the size and shape of your heart and how well your heart's chambers and valves are working. This procedure takes approximately one hour. There are no restrictions for this procedure. Please do NOT wear cologne, perfume, aftershave, or lotions (deodorant is allowed). Please arrive 15 minutes prior to your appointment time.  Please note: We ask at that you not bring children  with you during ultrasound (echo/ vascular) testing. Due to room size and safety concerns, children are not allowed in the ultrasound rooms during exams. Our front office staff cannot provide observation of children in our lobby area while testing is being conducted. An adult accompanying a patient to their appointment will only be allowed in the ultrasound room at the discretion of the ultrasound technician under special circumstances. We apologize for any inconvenience.   Follow-Up: At [facility], you and your health needs are our priority.  As part of our continuing mission to provide you with exceptional heart care, our providers are all part of one team.  This team includes your primary Cardiologist (physician) and Advanced Practice Providers or APPs (Physician Assistants and Nurse Practitioners) who all work together to provide you with the care you need, when you need it.  Your next appointment:   1 year(s)  Provider:   Ozell Wonda, MD      Signed, Ozell Wonda, MD  05/16/2024 2:05 PM     [facility]

## 2024-05-16 NOTE — Assessment & Plan Note (Signed)
 The patient has stable, moderate paravalvular aortic regurgitation.  I personally reviewed his echo images today and they appear unchanged with no evidence of LV dysfunction or dilatation.  He has had no evidence of hemolysis and his paravalvular regurgitation is unchanged compared to last year's study.  I will await the formal echo interpretation and I told the patient I would get in touch with him if there were any differences in the formal interpretation from what I have seen today.  Return in 1 year for follow-up with a repeat echocardiogram.  No indication for intervention at this point.

## 2024-05-19 ENCOUNTER — Ambulatory Visit: Payer: Self-pay | Admitting: Cardiovascular Disease

## 2024-05-23 DIAGNOSIS — D3132 Benign neoplasm of left choroid: Secondary | ICD-10-CM | POA: Diagnosis not present

## 2024-05-23 DIAGNOSIS — H26493 Other secondary cataract, bilateral: Secondary | ICD-10-CM | POA: Diagnosis not present

## 2024-05-23 DIAGNOSIS — F330 Major depressive disorder, recurrent, mild: Secondary | ICD-10-CM | POA: Diagnosis not present

## 2024-06-06 DIAGNOSIS — F330 Major depressive disorder, recurrent, mild: Secondary | ICD-10-CM | POA: Diagnosis not present

## 2024-06-07 ENCOUNTER — Other Ambulatory Visit: Payer: Self-pay | Admitting: Nurse Practitioner

## 2024-06-08 ENCOUNTER — Ambulatory Visit: Admitting: Neurology

## 2024-06-08 ENCOUNTER — Encounter: Payer: Self-pay | Admitting: Neurology

## 2024-06-08 VITALS — BP 123/73 | HR 73 | Ht 66.0 in | Wt 186.8 lb

## 2024-06-08 DIAGNOSIS — G40009 Localization-related (focal) (partial) idiopathic epilepsy and epileptic syndromes with seizures of localized onset, not intractable, without status epilepticus: Secondary | ICD-10-CM | POA: Diagnosis not present

## 2024-06-08 MED ORDER — OXCARBAZEPINE 600 MG PO TABS: 600.0000 mg | ORAL_TABLET | Freq: Two times a day (BID) | ORAL | 3 refills | Status: AC

## 2024-06-08 NOTE — Patient Instructions (Signed)
 I wish you all the best.   Continue Oxcarbazepine  600mg  twice a day  2. Please discuss depression medication with Dr. Okey. Consider seeing a psychiatrist for medication management  3. Follow-up in 6 months, call for any changes   Seizure Precautions: 1. If medication has been prescribed for you to prevent seizures, take it exactly as directed.  Do not stop taking the medicine without talking to your doctor first, even if you have not had a seizure in a long time.   2. Avoid activities in which a seizure would cause danger to yourself or to others.  Don't operate dangerous machinery, swim alone, or climb in high or dangerous places, such as on ladders, roofs, or girders.  Do not drive unless your doctor says you may.  3. If you have any warning that you may have a seizure, lay down in a safe place where you can't hurt yourself.    4.  No driving for 6 months from last seizure, as per [place]  state law.   Please refer to the following link on the Epilepsy Foundation of America's website for more information: http://www.epilepsyfoundation.org/answerplace/Social/driving/drivingu.cfm   5.  Maintain good sleep hygiene.  Avoid alcohol.  6.  Contact your doctor if you have any problems that may be related to the medicine you are taking.  7.  Call 911 and bring the patient back to the ED if:        A.  The seizure lasts longer than 5 minutes.       B.  The patient doesn't awaken shortly after the seizure  C.  The patient has new problems such as difficulty seeing, speaking or moving  D.  The patient was injured during the seizure  E.  The patient has a temperature over 102 F (39C)  F.  The patient vomited and now is having trouble breathing

## 2024-06-08 NOTE — Progress Notes (Signed)
 NEUROLOGY FOLLOW UP OFFICE NOTE  Douglas Lane [id number] [birth year]  HISTORY OF PRESENT ILLNESS: I had the pleasure of seeing Douglas Lane in follow-up in the neurology clinic on 06/08/2024.  The patient was last seen 3 months ago for seizures. He is alone in the office today. Records and images were personally reviewed where available. He was weaned off Levetiracetam  in June due to sleepiness, fatigue, listlessness/lack of motivation. He was switched to Oxcarbazepine  600mg  BID to also hopefully help with mood stabilization, however he has not noticed any improvement in symptoms off the Levetiracetam . No concern for seizures since 02/2023. He denies any staring/unresponsive episodes, gaps in time, olfactory/gustatory hallucinations, focal numbness/tingling/weakness, myoclonic jerks. Main concern today continues to be the mood. He states he has all the classic signs of depression, the Cymbalta prescribed by PCP is not helping. He has seen the therapist twice. Some days he does not want to get out of bed, there is no motivation, he is distractible, memory is horrible, balance is not good. He takes CBD gummies with melatonin which seems to help sleep, he gets almost 12 hours of sleep, then takes a 2 hours nap. He has had plenty of falls but no injuries, last fall was on Wednesday. He has lower back issues, no paresthesias. He takes clonazepam 0.5mg  every night which helps him relax. He drinks a touch of scotch after dinner and non-alcoholic beer. His B12 level in 02/2024 was low normal, he took the 500mcg supplements but it did not seem to do anything so he stopped it. He would like to reduce his medication burden.    Lab Results  Component Value Date   TSH 2.60 03/09/2024   Lab Results  Component Value Date   VITAMINB12 245 03/09/2024    History on Initial Assessment 08/20/2014: This is a 78 yo RH man with a history of 2 seizures in 2012 that occurred 6 months apart, presenting after ER visit for  a seizure last 08/03/14. The first seizure occurred in June 2012. He recalls it was a stressful period just soon after he retired. He had a nocturnal convulsion and was brought to [facility]. He was not started on medication initially as it was his first seizure. On December 2012, he had another nocturnal convulsion and was started on Keppra  500mg  BID with no further seizures for 3 years.  He and his neurologist in [facility] agreed to start weaning off Keppra , and this was completely discontinued the spring/summer of 2015. Around 2-1/2 years ago, he had also been reporting anxiety to his neurologist, and was started on clorazepate 7.5mg , weaned to 1/2 tab for 6 months, then discontinued 3 days prior to the most recent nocturnal seizure on 12/16 off AEDs. He was brought to [facility] ER, there is no bloodwork or drug screen for review, CT head unremarkable.He was found to have posterior dislocation and comminuted fracture of the right humeral head. He had soft tissue injury extending along the right anterior proximal arm. This was reduced in the ER and he continues to wear a sling. He was restarted on Keppra  500mg  BID with no side effects except for mild drowsiness.   His wife reports that since coming off the Keppra  last year, he has had 3 incidences of memory lapses, where he could not recall the last few hours. His wife reports that he would look confused but he can function and drive. The last episode occurred 6 weeks ago, he did not recall being in church.  His wife reports he was talking fine but had a different look about him. This lasted about 1-2 hours. He had neuropsychological testing for memory loss and was told he has short-term memory problems.  In hindsight, when asked about an MRI done in January 2012, he had an episode where he had transient [place] I personally reviewed MRI brain without contrast done which showed mild chronic microvascular changes in the  bilateral subcortical regions and right central pons.  He denies any episodes of staring/unresponsiveness, no olfactory/gustatory hallucinations, deja vu, rising epigastric sensation, focal numbness/tingling/weakness, myoclonic jerks. He has 1-4 drinks daily (beer or whisky), with no change in pattern for many years. He denies any sleep deprivation prior to the seizures, but reports these being stressful periods. 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   Epilepsy Risk Factors:  He had a normal birth and early development.  There is no history of febrile convulsions, CNS infections such as meningitis/encephalitis, significant traumatic brain injury, neurosurgical procedures, or family history of seizures.  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 CP/USA  -> LHC 03/31/16: 90% prox-mid RCA s/p DES, 40% prox-mid LAD, 25% D2, 25% prox-mid Cx, elevated LVEDP 33.   Chronic diastolic CHF (congestive heart failure) (HCC) 03/31/2016   pt unaware, has family history   Chronic lower back pain    DDD (degenerative disc disease), lumbar    Dyslipidemia    Grade II diastolic dysfunction    Hearing loss    Heart murmur    Heartburn    occ. takes TUMS   Humeral fracture 07/2014   Right   Kidney cysts    1.6 cm simple cyst right   LVH (left ventricular hypertrophy)    a. severe basal septal hypertrophy by echo 03/2016.   Mitral regurgitation    a. mild by echo 03/2016.   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 07/2015, absent seizure    Sinus bradycardia - baseline HR 50s at times.    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    Thrombosed hemorrhoids     MEDICATIONS: Current Outpatient Medications on File Prior to Visit  Medication Sig Dispense Refill   acetaminophen  (TYLENOL ) 500 MG tablet as needed for headache.     amLODipine  (NORVASC ) 5 MG tablet TAKE 1 TABLET(5 MG) BY MOUTH DAILY 90 tablet 0   aspirin  EC 81 MG tablet Take 81 mg by mouth daily. Swallow whole.     calcium  carbonate (TUMS - DOSED IN MG ELEMENTAL CALCIUM ) 500 MG chewable tablet Chew 1 tablet by mouth daily as needed for heartburn.      chlorthalidone  (HYGROTON ) 25 MG tablet TAKE 1 TABLET(25 MG) BY MOUTH DAILY 90 tablet 3   clobetasol ointment (TEMOVATE) 0.05 % Apply 1 application topically 2 (two) times daily as needed (for psoriasis).     clonazePAM (KLONOPIN) 0.5 MG tablet Take 0.5 mg  by mouth 2 (two) times daily as needed for anxiety. (Patient taking differently: Take 0.5 mg by mouth daily.)     desonide (DESOWEN) 0.05 % ointment Apply 1 application topically daily as needed (psoriasis).      diphenhydrAMINE  (BENADRYL ) 25 mg capsule Take 25 mg by mouth daily as needed for allergies.     DULoxetine (CYMBALTA) 60 MG capsule Take 60 mg by mouth daily.     ezetimibe  (ZETIA ) 10 MG tablet TAKE 1 TABLET(10 MG) BY MOUTH DAILY 90 tablet 3   ibuprofen (ADVIL) 200 MG tablet Take 200 mg by mouth every 6 (six) hours as needed.     oxcarbazepine  (TRILEPTAL ) 600 MG tablet Take 1 tablet (600 mg total) by mouth 2 (two) times daily. 180 tablet 3   potassium chloride  (KLOR-CON ) 10 MEQ tablet TAKE 1 TABLET(10 MEQ) BY MOUTH TWICE DAILY 180 tablet 0   PROCTOZONE -HC 2.5 % rectal cream Apply 1 application topically daily as needed for hemorrhoids or itching.      rosuvastatin  (CRESTOR ) 40 MG tablet Take 1 tablet (40 mg total) by mouth daily. 90 tablet 3   shark liver oil-cocoa butter (PREPARATION H) 0.25-3-85.5 % suppository Place 1 suppository rectally as  needed for hemorrhoids.     tamsulosin (FLOMAX) 0.4 MG CAPS capsule Take 0.4 mg by mouth daily.     Probiotic Product (PROBIOTIC DAILY PO) Take by mouth. (Patient not taking: Reported on 06/08/2024)     No current facility-administered medications on file prior to visit.    ALLERGIES: No Known Allergies  FAMILY HISTORY: Family History  Problem Relation Age of Onset   Renal Disease Mother    Congestive Heart Failure Father    Depression Sister     SOCIAL HISTORY: Social History   Socioeconomic History   Marital status: Married    Spouse name: Not on file   Number of children: 1   Years of education: Not on file   Highest education level: Not on file  Occupational History   Not on file  Tobacco Use   Smoking status: Never   Smokeless tobacco: Never  Vaping Use   Vaping status: Never Used  Substance and Sexual Activity   Alcohol use: Yes    Alcohol/week: 2.0 standard drinks of alcohol    Types: 1 Cans of beer, 1 Shots of liquor per week    Comment: daily   Drug use: No   Sexual activity: Not Currently  Other Topics Concern   Not on file  Social History Narrative   Lives with wife and son in two story home      Right handed      Master's degree      Drinks caffeine (Coffee) daily   Social Drivers of Corporate Investment Banker Strain: Not on file  Food Insecurity: Not on file  Transportation Needs: Not on file  Physical Activity: Not on file  Stress: Not on file  Social Connections: Not on file  Intimate Partner Violence: Not on file     PHYSICAL EXAM: Vitals:   06/08/24 1128  BP: 123/73  Pulse: 73  SpO2: 94%   General: No acute distress Head:  Normocephalic/atraumatic Skin/Extremities: No rash, no edema Neurological Exam: alert and awake. No aphasia or dysarthria. Fund of knowledge is appropriate.  Attention and concentration are normal.   Cranial nerves: Pupils equal, round. Extraocular movements intact. No facial asymmetry.  Motor: moves all  extremities symmetrically at last anti-gravity x 4. Gait narrow-based and steady, no  ataxia.    IMPRESSION: This is a 78 yo RH man with a history of 2 convulsive seizures in 2012. After being seizure-free for 3 years, Keppra  was tapered off in 2015. He was also taking clorazepate for anxiety and this was tapered off 3 days prior to a nocturnal seizure in 07/2014. No convulsions since 2015. Since then he has had brief episodes of transient amnesia, none since 02/2023. He is on Oxcarbazepine  600mg  BID with no improvement in mood symptoms off Keppra . He has started psychotherapy but may benefit from Psychiatry evaluation as well for medication management. He will discuss Cymbalta concerns with Dr. Okey. We also discussed the sleep apnea seen on prior sleep study, he does not believe the sleep study and did not tolerate CPAP mask. We discussed how uncontrolled sleep apnea can also contribute to fatigue, memory changes. He is aware of [place] driving laws to stop driving after a seizure until 6 months seizure-free. Follow-up in 6 months, call for any changes.    Thank you for allowing me to participate in his care.  Please do not hesitate to call for any questions or concerns.    Darice Shivers, M.D.   CC: Dr. Okey

## 2024-06-12 ENCOUNTER — Other Ambulatory Visit: Payer: Self-pay | Admitting: Cardiovascular Disease

## 2024-06-19 DIAGNOSIS — Z79899 Other long term (current) drug therapy: Secondary | ICD-10-CM | POA: Diagnosis not present

## 2024-06-19 DIAGNOSIS — E7800 Pure hypercholesterolemia, unspecified: Secondary | ICD-10-CM | POA: Diagnosis not present

## 2024-06-20 DIAGNOSIS — F330 Major depressive disorder, recurrent, mild: Secondary | ICD-10-CM | POA: Diagnosis not present

## 2024-06-21 DIAGNOSIS — F439 Reaction to severe stress, unspecified: Secondary | ICD-10-CM | POA: Diagnosis not present

## 2024-06-21 DIAGNOSIS — F332 Major depressive disorder, recurrent severe without psychotic features: Secondary | ICD-10-CM | POA: Diagnosis not present

## 2024-06-21 DIAGNOSIS — Z6830 Body mass index (BMI) 30.0-30.9, adult: Secondary | ICD-10-CM | POA: Diagnosis not present

## 2024-06-21 DIAGNOSIS — Z0000 Encounter for general adult medical examination without abnormal findings: Secondary | ICD-10-CM | POA: Diagnosis not present

## 2024-06-21 DIAGNOSIS — Z23 Encounter for immunization: Secondary | ICD-10-CM | POA: Diagnosis not present

## 2024-07-04 ENCOUNTER — Ambulatory Visit (HOSPITAL_COMMUNITY): Admitting: Licensed Clinical Social Worker

## 2024-07-04 ENCOUNTER — Encounter: Payer: Self-pay | Admitting: Neurology

## 2024-07-08 ENCOUNTER — Other Ambulatory Visit: Payer: Self-pay | Admitting: Cardiovascular Disease

## 2024-07-08 DIAGNOSIS — I10 Essential (primary) hypertension: Secondary | ICD-10-CM

## 2024-07-10 ENCOUNTER — Other Ambulatory Visit: Payer: Self-pay | Admitting: Cardiovascular Disease

## 2024-07-10 DIAGNOSIS — I10 Essential (primary) hypertension: Secondary | ICD-10-CM

## 2024-07-15 ENCOUNTER — Other Ambulatory Visit: Payer: Self-pay | Admitting: Nurse Practitioner

## 2024-07-16 ENCOUNTER — Other Ambulatory Visit: Payer: Self-pay

## 2024-07-16 ENCOUNTER — Encounter: Payer: Self-pay | Admitting: Cardiovascular Disease

## 2024-07-16 MED ORDER — EZETIMIBE 10 MG PO TABS: 10.0000 mg | ORAL_TABLET | Freq: Every day | ORAL | 3 refills | Status: AC

## 2025-01-02 ENCOUNTER — Ambulatory Visit: Admitting: Neurology

## 2025-05-16 ENCOUNTER — Other Ambulatory Visit (HOSPITAL_COMMUNITY)
